# Patient Record
Sex: Female | Born: 1955
Health system: Southern US, Community
[De-identification: ages and names within clinical notes are randomized; demographics above are authoritative.]

## PROBLEM LIST (undated history)

## (undated) DIAGNOSIS — L84 Corns and callosities: Secondary | ICD-10-CM

## (undated) DIAGNOSIS — M722 Plantar fascial fibromatosis: Secondary | ICD-10-CM

## (undated) DIAGNOSIS — I10 Essential (primary) hypertension: Secondary | ICD-10-CM

## (undated) DIAGNOSIS — M1990 Unspecified osteoarthritis, unspecified site: Secondary | ICD-10-CM

## (undated) DIAGNOSIS — E785 Hyperlipidemia, unspecified: Secondary | ICD-10-CM

## (undated) DIAGNOSIS — M5416 Radiculopathy, lumbar region: Secondary | ICD-10-CM

## (undated) DIAGNOSIS — E669 Obesity, unspecified: Secondary | ICD-10-CM

## (undated) DIAGNOSIS — K760 Fatty (change of) liver, not elsewhere classified: Secondary | ICD-10-CM

## (undated) DIAGNOSIS — K219 Gastro-esophageal reflux disease without esophagitis: Secondary | ICD-10-CM

## (undated) DIAGNOSIS — T7840XA Allergy, unspecified, initial encounter: Secondary | ICD-10-CM

## (undated) DIAGNOSIS — E119 Type 2 diabetes mellitus without complications: Secondary | ICD-10-CM

## (undated) DIAGNOSIS — Z5189 Encounter for other specified aftercare: Secondary | ICD-10-CM

## (undated) DIAGNOSIS — H269 Unspecified cataract: Secondary | ICD-10-CM

## (undated) DIAGNOSIS — M549 Dorsalgia, unspecified: Secondary | ICD-10-CM

## (undated) DIAGNOSIS — K635 Polyp of colon: Secondary | ICD-10-CM

## (undated) DIAGNOSIS — E559 Vitamin D deficiency, unspecified: Secondary | ICD-10-CM

## (undated) HISTORY — PX: JOINT REPLACEMENT: SHX530

## (undated) HISTORY — DX: Unspecified cataract: H26.9

## (undated) HISTORY — DX: Polyp of colon: K63.5

## (undated) HISTORY — DX: Corns and callosities: L84

## (undated) HISTORY — DX: Type 2 diabetes mellitus without complications: E11.9

## (undated) HISTORY — PX: TUBAL LIGATION: SHX77

## (undated) HISTORY — DX: Hyperlipidemia, unspecified: E78.5

## (undated) HISTORY — DX: Vitamin D deficiency, unspecified: E55.9

## (undated) HISTORY — DX: Essential (primary) hypertension: I10

## (undated) HISTORY — DX: Obesity, unspecified: E66.9

## (undated) HISTORY — DX: Dorsalgia, unspecified: M54.9

## (undated) HISTORY — DX: Gastro-esophageal reflux disease without esophagitis: K21.9

## (undated) HISTORY — DX: Radiculopathy, lumbar region: M54.16

## (undated) HISTORY — DX: Plantar fascial fibromatosis: M72.2

## (undated) HISTORY — DX: Encounter for other specified aftercare: Z51.89

## (undated) HISTORY — DX: Fatty (change of) liver, not elsewhere classified: K76.0

## (undated) HISTORY — DX: Allergy, unspecified, initial encounter: T78.40XA

---

## 2004-05-13 ENCOUNTER — Ambulatory Visit: Payer: Self-pay

## 2005-04-06 ENCOUNTER — Ambulatory Visit: Payer: Self-pay | Admitting: Unknown Physician Specialty

## 2005-08-09 HISTORY — PX: DILATION AND CURETTAGE OF UTERUS: SHX78

## 2005-10-26 ENCOUNTER — Ambulatory Visit: Payer: Self-pay

## 2006-04-14 ENCOUNTER — Ambulatory Visit: Payer: Self-pay

## 2006-04-26 ENCOUNTER — Ambulatory Visit: Payer: Self-pay

## 2007-08-10 HISTORY — PX: COLONOSCOPY: SHX174

## 2007-08-10 LAB — HM COLONOSCOPY

## 2008-05-07 ENCOUNTER — Ambulatory Visit: Payer: Self-pay | Admitting: Physician Assistant

## 2008-06-25 ENCOUNTER — Ambulatory Visit: Payer: Self-pay | Admitting: Gastroenterology

## 2008-07-08 ENCOUNTER — Ambulatory Visit: Payer: Self-pay | Admitting: Family Medicine

## 2009-06-03 ENCOUNTER — Ambulatory Visit: Payer: Self-pay | Admitting: Family Medicine

## 2010-08-09 HISTORY — PX: APPENDECTOMY: SHX54

## 2010-08-09 HISTORY — PX: ABDOMINAL HYSTERECTOMY: SHX81

## 2012-07-12 HISTORY — PX: BREAST BIOPSY: SHX20

## 2012-07-19 ENCOUNTER — Ambulatory Visit: Payer: Self-pay | Admitting: Family Medicine

## 2012-07-20 ENCOUNTER — Ambulatory Visit: Payer: Self-pay | Admitting: Family Medicine

## 2012-07-27 ENCOUNTER — Ambulatory Visit: Payer: Self-pay | Admitting: Family Medicine

## 2013-03-01 ENCOUNTER — Ambulatory Visit: Payer: Self-pay | Admitting: Family Medicine

## 2013-03-13 ENCOUNTER — Ambulatory Visit: Payer: Self-pay | Admitting: Orthopedic Surgery

## 2013-03-14 ENCOUNTER — Encounter: Payer: Self-pay | Admitting: Family Medicine

## 2013-04-09 ENCOUNTER — Encounter: Payer: Self-pay | Admitting: Family Medicine

## 2014-08-09 DIAGNOSIS — E119 Type 2 diabetes mellitus without complications: Secondary | ICD-10-CM

## 2014-08-09 HISTORY — DX: Type 2 diabetes mellitus without complications: E11.9

## 2014-11-13 LAB — HM MAMMOGRAPHY: HM MAMMO: ABNORMAL

## 2014-12-02 LAB — LIPID PANEL
Cholesterol: 194 mg/dL (ref 0–200)
HDL: 42 mg/dL (ref 35–70)
LDL CALC: 84 mg/dL
TRIGLYCERIDES: 339 mg/dL — AB (ref 40–160)

## 2014-12-02 LAB — HEMOGLOBIN A1C: Hgb A1c MFr Bld: 6.9 % — AB (ref 4.0–6.0)

## 2014-12-09 ENCOUNTER — Other Ambulatory Visit: Payer: Self-pay | Admitting: Family Medicine

## 2014-12-09 DIAGNOSIS — R921 Mammographic calcification found on diagnostic imaging of breast: Secondary | ICD-10-CM

## 2014-12-09 DIAGNOSIS — N6310 Unspecified lump in the right breast, unspecified quadrant: Secondary | ICD-10-CM

## 2014-12-10 ENCOUNTER — Ambulatory Visit: Payer: 59

## 2014-12-10 ENCOUNTER — Ambulatory Visit: Payer: Self-pay

## 2014-12-10 ENCOUNTER — Ambulatory Visit
Admission: RE | Admit: 2014-12-10 | Discharge: 2014-12-10 | Disposition: A | Discharge status: Home / Self Care | Payer: 59 | Source: Ambulatory Visit | Attending: Family Medicine | Admitting: Family Medicine

## 2014-12-10 DIAGNOSIS — R928 Other abnormal and inconclusive findings on diagnostic imaging of breast: Secondary | ICD-10-CM | POA: Insufficient documentation

## 2014-12-10 DIAGNOSIS — N63 Unspecified lump in breast: Secondary | ICD-10-CM | POA: Diagnosis present

## 2014-12-10 DIAGNOSIS — N6310 Unspecified lump in the right breast, unspecified quadrant: Secondary | ICD-10-CM

## 2014-12-31 LAB — HM PAP SMEAR: HM Pap smear: NORMAL

## 2015-02-24 ENCOUNTER — Ambulatory Visit (INDEPENDENT_AMBULATORY_CARE_PROVIDER_SITE_OTHER): Payer: 59 | Admitting: Family Medicine

## 2015-02-24 ENCOUNTER — Encounter (INDEPENDENT_AMBULATORY_CARE_PROVIDER_SITE_OTHER): Payer: Self-pay

## 2015-02-24 ENCOUNTER — Encounter: Payer: Self-pay | Admitting: Family Medicine

## 2015-02-24 VITALS — BP 118/68 | HR 98 | Temp 97.9°F | Resp 18 | Ht 66.0 in | Wt 222.4 lb

## 2015-02-24 DIAGNOSIS — E785 Hyperlipidemia, unspecified: Secondary | ICD-10-CM | POA: Insufficient documentation

## 2015-02-24 DIAGNOSIS — R809 Proteinuria, unspecified: Secondary | ICD-10-CM

## 2015-02-24 DIAGNOSIS — E1129 Type 2 diabetes mellitus with other diabetic kidney complication: Secondary | ICD-10-CM | POA: Insufficient documentation

## 2015-02-24 DIAGNOSIS — J309 Allergic rhinitis, unspecified: Secondary | ICD-10-CM

## 2015-02-24 DIAGNOSIS — M109 Gout, unspecified: Secondary | ICD-10-CM | POA: Insufficient documentation

## 2015-02-24 DIAGNOSIS — E1121 Type 2 diabetes mellitus with diabetic nephropathy: Secondary | ICD-10-CM | POA: Diagnosis not present

## 2015-02-24 DIAGNOSIS — K219 Gastro-esophageal reflux disease without esophagitis: Secondary | ICD-10-CM

## 2015-02-24 DIAGNOSIS — M1611 Unilateral primary osteoarthritis, right hip: Secondary | ICD-10-CM | POA: Insufficient documentation

## 2015-02-24 DIAGNOSIS — M545 Low back pain, unspecified: Secondary | ICD-10-CM | POA: Insufficient documentation

## 2015-02-24 DIAGNOSIS — Z86010 Personal history of colonic polyps: Secondary | ICD-10-CM | POA: Insufficient documentation

## 2015-02-24 DIAGNOSIS — K760 Fatty (change of) liver, not elsewhere classified: Secondary | ICD-10-CM

## 2015-02-24 DIAGNOSIS — I10 Essential (primary) hypertension: Secondary | ICD-10-CM | POA: Diagnosis not present

## 2015-02-24 DIAGNOSIS — K429 Umbilical hernia without obstruction or gangrene: Secondary | ICD-10-CM | POA: Insufficient documentation

## 2015-02-24 DIAGNOSIS — E559 Vitamin D deficiency, unspecified: Secondary | ICD-10-CM | POA: Insufficient documentation

## 2015-02-24 DIAGNOSIS — Z8669 Personal history of other diseases of the nervous system and sense organs: Secondary | ICD-10-CM | POA: Insufficient documentation

## 2015-02-24 DIAGNOSIS — E669 Obesity, unspecified: Secondary | ICD-10-CM | POA: Insufficient documentation

## 2015-02-24 DIAGNOSIS — L84 Corns and callosities: Secondary | ICD-10-CM | POA: Insufficient documentation

## 2015-02-24 DIAGNOSIS — J3089 Other allergic rhinitis: Secondary | ICD-10-CM

## 2015-02-24 LAB — POCT GLYCOSYLATED HEMOGLOBIN (HGB A1C): HEMOGLOBIN A1C: 6.7

## 2015-02-24 MED ORDER — INVOKANA 300 MG PO TABS: 300.0000 mg | ORAL_TABLET | Freq: Every day | ORAL | Status: DC

## 2015-02-24 MED ORDER — MONTELUKAST SODIUM 10 MG PO TABS: 10.0000 mg | ORAL_TABLET | Freq: Every day | ORAL | Status: DC

## 2015-02-24 MED ORDER — WELCHOL 3.75 G PO PACK: 1.0000 | PACK | Freq: Every day | ORAL | Status: DC

## 2015-02-24 MED ORDER — AMLODIPINE-OLMESARTAN 5-40 MG PO TABS: 1.0000 | ORAL_TABLET | Freq: Every day | ORAL | Status: DC

## 2015-02-24 MED ORDER — ALLOPURINOL 300 MG PO TABS: 300.0000 mg | ORAL_TABLET | Freq: Every day | ORAL | Status: DC

## 2015-02-24 MED ORDER — ATORVASTATIN CALCIUM 20 MG PO TABS: 20.0000 mg | ORAL_TABLET | Freq: Every day | ORAL | Status: DC

## 2015-02-24 NOTE — Progress Notes (Signed)
Name: Maria Cruz   MRN: [id number]    DOB: May 24, 1956   Date:02/24/2015       Progress Note  Subjective  Chief Complaint  Chief Complaint  Patient presents with  . Medication Refill    3 month F/U  . Diabetes    Checks every other day Low-133 Average-140 High- 168, when patient perspires it smells like vinegar  . Hyperlipidemia    Makes patient feel weird  . Hypertension  . Allergic Rhinitis     Noise is stopped up-worsten    HPI   DMII: she has been compliant with medication, glucose is at goal, she has microalbuminuria, today urine micro was 100. She denies polyphagia, polydipsia or polyuria. Denies recent hypoglycemic episodes, at most twice month, she states she gets dizzy or diaphoresis, but is doing well now.   HTN: taking Tribenzor, and bp is towards low end of normal, we will adjust medication and stop HCTZ since she has a history of gout.  Denies edema, constipation, chest pain or palpitation.  Hyperlipidemia: taking Lipitor and Welchol - she states she compliant with statin but Welchol causes constipation and she needs to skip doses sometimes  Controlled Gout: taking Allopurinol, states last episode was months ago.   AR: taking singulair and symptoms are under control   OA of right hip: going to [place], taking Tramadol prn, right now there is no pain. States symptoms are better, pain is intermittent, a few times weeks and can be severe, it lasts about hours but improves with Tramadol   Patient Active Problem List   Diagnosis Date Noted  . Uncontrolled type 2 DM with microalbuminuria or microproteinuria 02/24/2015  . Vitamin D deficiency 02/24/2015  . Perennial allergic rhinitis 02/24/2015  . GERD without esophagitis 02/24/2015  . Hypertension, benign 02/24/2015  . Controlled gout 02/24/2015  . Fatty liver 02/24/2015  . Callus of foot 02/24/2015  . Primary osteoarthritis of right hip 02/24/2015  . History of carpal tunnel syndrome 02/24/2015  .  Obesity (BMI 30-39.9) 02/24/2015  . Umbilical hernia without obstruction or gangrene 02/24/2015  . Intermittent low back pain 02/24/2015  . History of colon polyps 02/24/2015  . Dyslipidemia 02/24/2015    Past Surgical History  Procedure Laterality Date  . Abdominal hysterectomy  2012  . Appendectomy  2012  . Dilation and curettage of uterus  2007  . Breast biopsy Right 07/12/2012    Negative  . Tubal ligation      Family History  Problem Relation Age of Onset  . Multiple sclerosis Mother   . Heart attack Father   . Kidney failure Sister   . Heart attack Brother   . Diabetes Daughter   . CAD Sister   . Arthritis Sister     RA  . Lupus Sister     History   Social History  . Marital Status: Single    Spouse Name: N/A  . Number of Children: N/A  . Years of Education: N/A   Occupational History  . Not on file.   Social History Main Topics  . Smoking status: Never Smoker   . Smokeless tobacco: Never Used  . Alcohol Use: No  . Drug Use: No  . Sexual Activity: Yes   Other Topics Concern  . Not on file   Social History Narrative     Current outpatient prescriptions:  .  allopurinol (ZYLOPRIM) 300 MG tablet, Take 1 tablet (300 mg total) by mouth daily., Disp: 90 tablet, Rfl: 1 .  atorvastatin (LIPITOR) 20 MG tablet, Take 1 tablet (20 mg total) by mouth daily., Disp: 90 tablet, Rfl: 1 .  Glucose Blood (FREESTYLE LITE TEST VI), , Disp: , Rfl:  .  INVOKANA 300 MG TABS tablet, Take 300 mg by mouth daily., Disp: 90 tablet, Rfl: 1 .  Lancets (FREESTYLE) lancets, , Disp: , Rfl:  .  montelukast (SINGULAIR) 10 MG tablet, Take 1 tablet (10 mg total) by mouth daily., Disp: 90 tablet, Rfl: 1 .  traMADol (ULTRAM) 50 MG tablet, Take 1 tablet by mouth daily., Disp: , Rfl:  .  WELCHOL 3.75 G PACK, Take 1 packet by mouth daily. Mix with 6 oz of fluids daily., Disp: 90 each, Rfl: 1 .  amLODipine-olmesartan (AZOR) 5-40 MG per tablet, Take 1 tablet by mouth daily., Disp: 90 tablet,  Rfl: 0  Allergies  Allergen Reactions  . Ace Inhibitors Cough  . Lipofen [Fenofibrate] Other (See Comments)    Localized drug reaction, blister on back  . Penicillins Itching     ROS  Constitutional: Negative for fever or weight change.  Respiratory: Negative for cough and shortness of breath.   Cardiovascular: Negative for chest pain or palpitations.  Gastrointestinal: Negative for abdominal pain, no bowel changes.  Musculoskeletal: Negative for gait problem or joint swelling.  Skin: Negative for rash.  Neurological: Negative for dizziness or headache.  No other specific complaints in a complete review of systems (except as listed in HPI above).  Objective  Filed Vitals:   02/24/15 1017  BP: 118/68  Pulse: 98  Temp: 97.9 F (36.6 C)  TempSrc: Oral  Resp: 18  Height: 5\' 6"  (1.676 m)  Weight: 222 lb 6.4 oz (100.88 kg)  SpO2: 97%    Body mass index is 35.91 kg/(m^2).  Physical Exam  Constitutional: Patient appears well-developed and well-nourished. No distress.  HENT: Head: Normocephalic and atraumatic.  Nose: Nose normal. Mouth/Throat: Oropharynx is clear and moist. No oropharyngeal exudate.  Eyes: Conjunctivae and EOM are normal. Pupils are equal, round, and reactive to light. No scleral icterus.  Neck: Normal range of motion. Neck supple. No JVD present. No thyromegaly present.  Cardiovascular: Normal rate, regular rhythm and normal heart sounds.  No murmur heard. No BLE edema. Pulmonary/Chest: Effort normal and breath sounds normal. No respiratory distress. Abdominal:  There is no tenderness. no masses Musculoskeletal: Normal range of motion, no joint effusions. No gross deformities. Decrease rom of right hip Neurological: he is alert and oriented to person, place, and time. No cranial nerve deficit. Coordination, balance, strength, speech and gait are normal.  Skin: Skin is warm and dry. No rash noted. No erythema.  Psychiatric: Patient has a normal mood and  affect. behavior is normal. Judgment and thought content normal.  Recent Results (from the past 2160 hour(s))  Lipid panel     Status: Abnormal   Collection Time: 12/02/14 12:00 AM  Result Value Ref Range   Triglycerides 339 (A) 40 - 160 mg/dL   Cholesterol 194 0 - 200 mg/dL   HDL 42 35 - 70 mg/dL   LDL Cholesterol 84 mg/dL  Hemoglobin A1c     Status: Abnormal   Collection Time: 12/02/14 12:00 AM  Result Value Ref Range   Hgb A1c MFr Bld 6.9 (A) 4.0 - 6.0 %  HM PAP SMEAR     Status: None   Collection Time: 12/31/14 12:00 AM  Result Value Ref Range   HM Pap smear normal   POCT HgB A1C  Status: None   Collection Time: 02/24/15 10:23 AM  Result Value Ref Range   Hemoglobin A1C 6.7     Diabetic Foot Exam - Simple   Simple Foot Form  Visual Inspection  See comments:  Yes  Sensation Testing  Intact to touch and monofilament testing bilaterally:  Yes  Pulse Check  Posterior Tibialis and Dorsalis pulse intact bilaterally:  Yes  Comments  Corn formation on both 5th toes and dryness on heels       PHQ2/9: Depression screen PHQ 2/9 02/24/2015  Decreased Interest 0  Down, Depressed, Hopeless 0  PHQ - 2 Score 0    Fall Risk: Fall Risk  02/24/2015  Falls in the past year? No     Assessment & Plan  1. Diabetic nephropathy associated with type 2 diabetes mellitus Doing well, continue medication, advised to drink more water to be able to tolerate Welchol daily  - POCT HgB A1C - WELCHOL 3.75 G PACK; Take 1 packet by mouth daily. Mix with 6 oz of fluids daily.  Dispense: 90 each; Refill: 1 - INVOKANA 300 MG TABS tablet; Take 300 mg by mouth daily.  Dispense: 90 tablet; Refill: 1  2. Perennial allergic rhinitis stable - montelukast (SINGULAIR) 10 MG tablet; Take 1 tablet (10 mg total) by mouth daily.  Dispense: 90 tablet; Refill: 1  3. GERD without esophagitis Under control with life style modification   4. Hypertension, benign Controlled, we will stop Tribenzor and  change to Azor because of history of gout and hypercalcemia - amLODipine-olmesartan (AZOR) 5-40 MG per tablet; Take 1 tablet by mouth daily.  Dispense: 90 tablet; Refill: 0  5. Controlled gout Doing well - allopurinol (ZYLOPRIM) 300 MG tablet; Take 1 tablet (300 mg total) by mouth daily.  Dispense: 90 tablet; Refill: 1  6. Fatty liver Discussed importance of weight loss and dietary modifcation  7. Primary osteoarthritis of right hip Continue Tramadol prn and follow up with [facility]  8. Dyslipidemia  - WELCHOL 3.75 G PACK; Take 1 packet by mouth daily. Mix with 6 oz of fluids daily.  Dispense: 90 each; Refill: 1 - atorvastatin (LIPITOR) 20 MG tablet; Take 1 tablet (20 mg total) by mouth daily.  Dispense: 90 tablet; Refill: 1

## 2015-04-14 ENCOUNTER — Other Ambulatory Visit: Payer: Self-pay | Admitting: Family Medicine

## 2015-05-27 ENCOUNTER — Encounter: Payer: Self-pay | Admitting: Family Medicine

## 2015-05-27 ENCOUNTER — Ambulatory Visit (INDEPENDENT_AMBULATORY_CARE_PROVIDER_SITE_OTHER): Payer: 59 | Admitting: Family Medicine

## 2015-05-27 VITALS — BP 126/84 | HR 98 | Temp 98.6°F | Resp 14 | Ht 66.0 in | Wt 224.9 lb

## 2015-05-27 DIAGNOSIS — E785 Hyperlipidemia, unspecified: Secondary | ICD-10-CM | POA: Diagnosis not present

## 2015-05-27 DIAGNOSIS — E1129 Type 2 diabetes mellitus with other diabetic kidney complication: Secondary | ICD-10-CM | POA: Diagnosis not present

## 2015-05-27 DIAGNOSIS — R809 Proteinuria, unspecified: Secondary | ICD-10-CM | POA: Diagnosis not present

## 2015-05-27 DIAGNOSIS — E669 Obesity, unspecified: Secondary | ICD-10-CM | POA: Diagnosis not present

## 2015-05-27 DIAGNOSIS — I10 Essential (primary) hypertension: Secondary | ICD-10-CM | POA: Diagnosis not present

## 2015-05-27 LAB — POCT GLYCOSYLATED HEMOGLOBIN (HGB A1C): HEMOGLOBIN A1C: 6.6

## 2015-05-27 LAB — POCT UA - MICROALBUMIN: Microalbumin Ur, POC: 10 mg/L

## 2015-05-27 MED ORDER — COENZYME Q10 30 MG PO CAPS: 30.0000 mg | ORAL_CAPSULE | Freq: Three times a day (TID) | ORAL | Status: DC

## 2015-05-27 NOTE — Progress Notes (Signed)
Name: Maria Cruz   MRN: [id number]    DOB: [birth year]   Date:05/27/2015       Progress Note  Subjective  Chief Complaint  Chief Complaint  Patient presents with  . Medication Refill    follow-up  . Diabetes    Checks BG every other week low-100, high-133  . Hypertension  . Hyperlipidemia  . Gout    HPI  DM II with proteinuria: urine micro is back to normal on ARB, taking Invokana and is tolerating it well, HgbA1C is at goal , she is denies polyphagia, polydipsia or polyuria.   Hyperlipidemia: she is taking Atorvastatin and states has some muscle aches after she takes it.   HTN: taking medication, bp is at goal, [employer] form filled out, by they want it below 120/80.  Denies chest pain or palpations  Gout: no recent episodes, taking allopurinol daily, denies side effects.   Obesity: she wanted me to fill out her work note stating that she is exempted from weight loss, but she gained 2 lbs since last year, and not losing. I will not fill out the form.   Patient Active Problem List   Diagnosis Date Noted  . Uncontrolled type 2 DM with microalbuminuria or microproteinuria 02/24/2015  . Vitamin D deficiency 02/24/2015  . Perennial allergic rhinitis 02/24/2015  . GERD without esophagitis 02/24/2015  . Hypertension, benign 02/24/2015  . Controlled gout 02/24/2015  . Fatty liver 02/24/2015  . Callus of foot 02/24/2015  . Primary osteoarthritis of right hip 02/24/2015  . History of carpal tunnel syndrome 02/24/2015  . Obesity (BMI 30-39.9) 02/24/2015  . Umbilical hernia without obstruction or gangrene 02/24/2015  . Intermittent low back pain 02/24/2015  . History of colon polyps 02/24/2015  . Dyslipidemia 02/24/2015    Past Surgical History  Procedure Laterality Date  . Abdominal hysterectomy  2012  . Appendectomy  2012  . Dilation and curettage of uterus  2007  . Breast biopsy Right 07/12/2012    Negative  . Tubal ligation      Family History  Problem Relation  Age of Onset  . Multiple sclerosis Mother   . Heart attack Father   . Kidney failure Sister   . Heart attack Brother   . Diabetes Daughter   . CAD Sister   . Arthritis Sister     RA  . Lupus Sister     Social History   Social History  . Marital Status: Single    Spouse Name: N/A  . Number of Children: N/A  . Years of Education: N/A   Occupational History  . Not on file.   Social History Main Topics  . Smoking status: Never Smoker   . Smokeless tobacco: Never Used  . Alcohol Use: No  . Drug Use: No  . Sexual Activity: Yes   Other Topics Concern  . Not on file   Social History Narrative     Current outpatient prescriptions:  .  allopurinol (ZYLOPRIM) 300 MG tablet, Take 1 tablet (300 mg total) by mouth daily., Disp: 90 tablet, Rfl: 1 .  atorvastatin (LIPITOR) 20 MG tablet, Take 1 tablet (20 mg total) by mouth daily., Disp: 90 tablet, Rfl: 1 .  AZOR 5-40 MG per tablet, Take 1 tablet by mouth  daily, Disp: 90 tablet, Rfl: 1 .  Glucose Blood (FREESTYLE LITE TEST VI), , Disp: , Rfl:  .  INVOKANA 300 MG TABS tablet, Take 300 mg by mouth daily., Disp: 90 tablet, Rfl: 1 .  Lancets (FREESTYLE) lancets, , Disp: , Rfl:  .  montelukast (SINGULAIR) 10 MG tablet, Take 1 tablet (10 mg total) by mouth daily., Disp: 90 tablet, Rfl: 1 .  traMADol (ULTRAM) 50 MG tablet, Take 1 tablet by mouth daily., Disp: , Rfl:  .  WELCHOL 3.75 G PACK, Take 1 packet by mouth daily. Mix with 6 oz of fluids daily., Disp: 90 each, Rfl: 1  Allergies  Allergen Reactions  . Ace Inhibitors Cough  . Lipofen [Fenofibrate] Other (See Comments)    Localized drug reaction, blister on back  . Penicillins Itching     ROS  Constitutional: Negative for fever or weight change.  Respiratory: Negative for cough and shortness of breath.   Cardiovascular: Negative for chest pain or palpitations.  Gastrointestinal: Negative for abdominal pain, no bowel changes.  Musculoskeletal: Negative for gait problem or  joint swelling.  Skin: Negative for rash.  Neurological: Negative for dizziness or headache.  No other specific complaints in a complete review of systems (except as listed in HPI above).  Objective  Filed Vitals:   05/27/15 0959  BP: 140/88  Pulse: 98  Temp: 98.6 F (37 C)  TempSrc: Oral  Resp: 14  Height: 5\' 6"  (1.676 m)  Weight: 224 lb 14.4 oz (102.014 kg)  SpO2: 96%    Body mass index is 36.32 kg/(m^2).  Physical Exam  Constitutional: Patient appears well-developed . ObeseNo distress.  HEENT: head atraumatic, normocephalic, pupils equal and reactive to light,  neck supple, throat within normal limits Cardiovascular: Normal rate, regular rhythm and normal heart sounds.  No murmur heard. No BLE edema. Pulmonary/Chest: Effort normal and breath sounds normal. No respiratory distress. Abdominal: Soft.  There is no tenderness. Psychiatric: Patient has a normal mood and affect. behavior is normal. Judgment and thought content normal.  Recent Results (from the past 2160 hour(s))  POCT UA - Microalbumin     Status: Abnormal   Collection Time: 05/27/15 10:06 AM  Result Value Ref Range   Microalbumin Ur, POC 10 mg/L   Creatinine, POC  mg/dL   Albumin/Creatinine Ratio, Urine, POC    POCT HgB A1C     Status: Abnormal   Collection Time: 05/27/15 10:16 AM  Result Value Ref Range   Hemoglobin A1C 6.6      PHQ2/9: Depression screen [facility] 2/9 05/27/2015 02/24/2015  Decreased Interest 0 0  Down, Depressed, Hopeless 0 0  PHQ - 2 Score 0 0    Fall Risk: Fall Risk  05/27/2015 02/24/2015  Falls in the past year? No No     Functional Status Survey: Is the patient deaf or have difficulty hearing?: No Does the patient have difficulty seeing, even when wearing glasses/contacts?: Yes (glasses) Does the patient have difficulty concentrating, remembering, or making decisions?: No Does the patient have difficulty walking or climbing stairs?: No Does the patient have difficulty  dressing or bathing?: No Does the patient have difficulty doing errands alone such as visiting a doctor's office or shopping?: No    Assessment & Plan  1. Type 2 diabetes mellitus with microalbuminuria, without long-term current use of insulin (HCC) At goal, continue medications - POCT UA - Microalbumin - POCT HgB A1C  2. Dyslipidemia  Taking Atorvastatin and gets achy, advised to take it at night with CoQ 10   3. Hypertension, benign  Recheck bp before she goes home, bp was up when she came in   4. Obesity (BMI 30-39.9)  Discussed with the patient the risk posed by an  increased BMI. Discussed importance of portion control, calorie counting and at least 150 minutes of physical activity weekly. Avoid sweet beverages and drink more water. Eat at least 6 servings of fruit and vegetables daily  She asked me to filled out form for work but she has not lost weight, so I can't

## 2015-06-30 ENCOUNTER — Other Ambulatory Visit: Payer: Self-pay | Admitting: Family Medicine

## 2015-06-30 NOTE — Telephone Encounter (Signed)
Patient requesting refill. 

## 2015-08-27 ENCOUNTER — Encounter: Payer: Self-pay | Admitting: Family Medicine

## 2015-08-27 ENCOUNTER — Ambulatory Visit (INDEPENDENT_AMBULATORY_CARE_PROVIDER_SITE_OTHER): Payer: 59 | Admitting: Family Medicine

## 2015-08-27 VITALS — BP 122/84 | HR 99 | Temp 97.7°F | Resp 16 | Wt 223.8 lb

## 2015-08-27 DIAGNOSIS — E785 Hyperlipidemia, unspecified: Secondary | ICD-10-CM | POA: Diagnosis not present

## 2015-08-27 DIAGNOSIS — R809 Proteinuria, unspecified: Secondary | ICD-10-CM

## 2015-08-27 DIAGNOSIS — E1129 Type 2 diabetes mellitus with other diabetic kidney complication: Secondary | ICD-10-CM | POA: Diagnosis not present

## 2015-08-27 DIAGNOSIS — I10 Essential (primary) hypertension: Secondary | ICD-10-CM

## 2015-08-27 DIAGNOSIS — M109 Gout, unspecified: Secondary | ICD-10-CM | POA: Diagnosis not present

## 2015-08-27 DIAGNOSIS — K219 Gastro-esophageal reflux disease without esophagitis: Secondary | ICD-10-CM

## 2015-08-27 DIAGNOSIS — K760 Fatty (change of) liver, not elsewhere classified: Secondary | ICD-10-CM | POA: Diagnosis not present

## 2015-08-27 DIAGNOSIS — J3089 Other allergic rhinitis: Secondary | ICD-10-CM

## 2015-08-27 DIAGNOSIS — Z23 Encounter for immunization: Secondary | ICD-10-CM | POA: Diagnosis not present

## 2015-08-27 DIAGNOSIS — J309 Allergic rhinitis, unspecified: Secondary | ICD-10-CM

## 2015-08-27 DIAGNOSIS — E559 Vitamin D deficiency, unspecified: Secondary | ICD-10-CM

## 2015-08-27 LAB — POCT GLYCOSYLATED HEMOGLOBIN (HGB A1C): HEMOGLOBIN A1C: 6.4

## 2015-08-27 MED ORDER — COLESEVELAM HCL 625 MG PO TABS: 1875.0000 mg | ORAL_TABLET | Freq: Two times a day (BID) | ORAL | Status: DC

## 2015-08-27 MED ORDER — LINAGLIPTIN-METFORMIN HCL ER 5-1000 MG PO TB24: 1.0000 | ORAL_TABLET | Freq: Every day | ORAL | Status: DC

## 2015-08-27 MED ORDER — AMLODIPINE-OLMESARTAN 5-40 MG PO TABS: ORAL_TABLET | ORAL | Status: DC

## 2015-08-27 MED ORDER — INVOKANA 300 MG PO TABS: 300.0000 mg | ORAL_TABLET | Freq: Every day | ORAL | Status: DC

## 2015-08-27 MED ORDER — MONTELUKAST SODIUM 10 MG PO TABS: 10.0000 mg | ORAL_TABLET | Freq: Every day | ORAL | Status: DC

## 2015-08-27 MED ORDER — ATORVASTATIN CALCIUM 20 MG PO TABS: 20.0000 mg | ORAL_TABLET | Freq: Every day | ORAL | Status: DC

## 2015-08-27 MED ORDER — ALLOPURINOL 300 MG PO TABS: 300.0000 mg | ORAL_TABLET | Freq: Every day | ORAL | Status: DC

## 2015-08-27 MED ORDER — COENZYME Q10 30 MG PO CAPS: 100.0000 mg | ORAL_CAPSULE | Freq: Every day | ORAL | Status: DC

## 2015-08-27 NOTE — Progress Notes (Signed)
Name: Maria Cruz   MRN: [id number]    DOB: 03/11/1956   Date:08/27/2015       Progress Note  Subjective  Chief Complaint  Chief Complaint  Patient presents with  . Diabetes    highest = 160, lowest = 130; check it twice/ week  . Medication Refill    invokana & janumet    HPI   DM II with proteinuria: urine micro is back to normal on ARB, taking Invokana, she states insurance is denying Janumet, she would like to try Jentadueto again since it worked best for her - no diarrhea with it. HgbA1C is at goal , she is denies polyphagia, polydipsia or polyuria.   Hyperlipidemia: she is taking Atorvastatin and states has some muscle aches after she takes it, advised to take with CoQ10. She is also taking Welchol but she has difficulty taking the powder and would like to try pills instead.   HTN: taking medication, bp is at goal. Denies chest pain or palpations  Gout: no recent episodes, taking allopurinol daily, denies side effects.   Obesity: she has lost a couple of lbs since last visit, she is eating healthier.    Patient Active Problem List   Diagnosis Date Noted  . Uncontrolled type 2 DM with microalbuminuria or microproteinuria 02/24/2015  . Vitamin D deficiency 02/24/2015  . Perennial allergic rhinitis 02/24/2015  . GERD without esophagitis 02/24/2015  . Hypertension, benign 02/24/2015  . Controlled gout 02/24/2015  . Fatty liver 02/24/2015  . Callus of foot 02/24/2015  . Primary osteoarthritis of right hip 02/24/2015  . History of carpal tunnel syndrome 02/24/2015  . Obesity (BMI 30-39.9) 02/24/2015  . Umbilical hernia without obstruction or gangrene 02/24/2015  . Intermittent low back pain 02/24/2015  . History of colon polyps 02/24/2015  . Dyslipidemia 02/24/2015    Past Surgical History  Procedure Laterality Date  . Abdominal hysterectomy  2012  . Appendectomy  2012  . Dilation and curettage of uterus  2007  . Breast biopsy Right 07/12/2012    Negative  .  Tubal ligation      Family History  Problem Relation Age of Onset  . Multiple sclerosis Mother   . Heart attack Father   . Kidney failure Sister   . Heart attack Brother   . Diabetes Daughter   . CAD Sister   . Arthritis Sister     RA  . Lupus Sister     Social History   Social History  . Marital Status: Single    Spouse Name: N/A  . Number of Children: N/A  . Years of Education: N/A   Occupational History  . Not on file.   Social History Main Topics  . Smoking status: Never Smoker   . Smokeless tobacco: Never Used  . Alcohol Use: No  . Drug Use: No  . Sexual Activity: Yes   Other Topics Concern  . Not on file   Social History Narrative     Current outpatient prescriptions:  .  allopurinol (ZYLOPRIM) 300 MG tablet, Take 1 tablet (300 mg total) by mouth daily., Disp: 90 tablet, Rfl: 1 .  amLODipine-olmesartan (AZOR) 5-40 MG tablet, Take 1 tablet by mouth  daily, Disp: 90 tablet, Rfl: 1 .  atorvastatin (LIPITOR) 20 MG tablet, Take 1 tablet (20 mg total) by mouth daily., Disp: 90 tablet, Rfl: 1 .  co-enzyme Q-10 30 MG capsule, Take 1 capsule (30 mg total) by mouth 3 (three) times daily., Disp: 30 capsule, Rfl:  0 .  Glucose Blood (FREESTYLE LITE TEST VI), , Disp: , Rfl:  .  INVOKANA 300 MG TABS tablet, Take 300 mg by mouth daily before breakfast., Disp: 90 tablet, Rfl: 1 .  Lancets (FREESTYLE) lancets, , Disp: , Rfl:  .  montelukast (SINGULAIR) 10 MG tablet, Take 1 tablet (10 mg total) by mouth daily., Disp: 90 tablet, Rfl: 1 .  traMADol (ULTRAM) 50 MG tablet, Take 1 tablet by mouth daily., Disp: , Rfl:  .  colesevelam (WELCHOL) 625 MG tablet, Take 3 tablets (1,875 mg total) by mouth 2 (two) times daily with a meal., Disp: 540 tablet, Rfl: 1 .  Linagliptin-Metformin HCl ER (JENTADUETO XR) 12-998 MG TB24, Take 1 tablet by mouth daily., Disp: 90 tablet, Rfl: 1  Allergies  Allergen Reactions  . Ace Inhibitors Cough  . Lipofen [Fenofibrate] Other (See Comments)     Localized drug reaction, blister on back  . Penicillins Itching     ROS   Constitutional: Negative for fever or weight change.  Respiratory: Negative for cough and shortness of breath.   Cardiovascular: Negative for chest pain or palpitations.  Gastrointestinal: Negative for abdominal pain, no bowel changes.  Musculoskeletal: Negative for gait problem or joint swelling.  Skin: Negative for rash.  Neurological: Negative for dizziness or headache.  No other specific complaints in a complete review of systems (except as listed in HPI above).  Objective  Filed Vitals:   08/27/15 1020  BP: 122/84  Pulse: 99  Temp: 97.7 F (36.5 C)  TempSrc: Oral  Resp: 16  Weight: 223 lb 12.8 oz (101.515 kg)  SpO2: 96%    Body mass index is 36.14 kg/(m^2).  Physical Exam  Constitutional: Patient appears well-developed and well-nourished. Obese No distress.  HEENT: head atraumatic, normocephalic, pupils equal and reactive to light, neck supple, throat within normal limits Cardiovascular: Normal rate, regular rhythm and normal heart sounds.  No murmur heard. No BLE edema. Pulmonary/Chest: Effort normal and breath sounds normal. No respiratory distress. Abdominal: Soft.  There is no tenderness. Psychiatric: Patient has a normal mood and affect. behavior is normal. Judgment and thought content normal.  Recent Results (from the past 2160 hour(s))  POCT glycosylated hemoglobin (Hb A1C)     Status: Abnormal   Collection Time: 08/27/15 10:36 AM  Result Value Ref Range   Hemoglobin A1C 6.4      PHQ2/9: Depression screen [facility] 2/9 08/27/2015 05/27/2015 02/24/2015  Decreased Interest 0 0 0  Down, Depressed, Hopeless 0 0 0  PHQ - 2 Score 0 0 0    Fall Risk: Fall Risk  08/27/2015 05/27/2015 02/24/2015  Falls in the past year? No No No     Functional Status Survey: Is the patient deaf or have difficulty hearing?: No Does the patient have difficulty seeing, even when wearing glasses/contacts?:  No Does the patient have difficulty concentrating, remembering, or making decisions?: No Does the patient have difficulty walking or climbing stairs?: No Does the patient have difficulty dressing or bathing?: No Does the patient have difficulty doing errands alone such as visiting a doctor's office or shopping?: No    Assessment & Plan  1. Controlled type 2 diabetes mellitus with microalbuminuria, without long-term current use of insulin ([place])  She states Janumet does not work as well for her. Jentadueto does not cause diarrhea - POCT glycosylated hemoglobin (Hb A1C) - Linagliptin-Metformin HCl ER (JENTADUETO XR) 12-998 MG TB24; Take 1 tablet by mouth daily.  Dispense: 90 tablet; Refill: 1 - INVOKANA 300  MG TABS tablet; Take 300 mg by mouth daily before breakfast.  Dispense: 90 tablet; Refill: 1  2. Dyslipidemia  - Lipid panel - atorvastatin (LIPITOR) 20 MG tablet; Take 1 tablet (20 mg total) by mouth daily.  Dispense: 90 tablet; Refill: 1 - welchol we will switch to pills twice daily    3. Hypertension, benign  At goal  - Comprehensive metabolic panel - amLODipine-olmesartan (AZOR) 5-40 MG tablet; Take 1 tablet by mouth  daily  Dispense: 90 tablet; Refill: 1  4. Fatty liver  Recheck labs  5. Controlled gout  - Uric acid - allopurinol (ZYLOPRIM) 300 MG tablet; Take 1 tablet (300 mg total) by mouth daily.  Dispense: 90 tablet; Refill: 1  6. GERD without esophagitis  Under control   7. Vitamin D deficiency  - VITAMIN D 25 Hydroxy (Vit-D Deficiency, Fractures)  8. Need for pneumococcal vaccination  - Pneumococcal conjugate vaccine 13-valent IM  9. Perennial allergic rhinitis  - montelukast (SINGULAIR) 10 MG tablet; Take 1 tablet (10 mg total) by mouth daily.  Dispense: 90 tablet; Refill: 1

## 2015-08-28 ENCOUNTER — Encounter: Payer: Self-pay | Admitting: Family Medicine

## 2015-08-28 ENCOUNTER — Telehealth: Payer: Self-pay

## 2015-08-28 LAB — COMPREHENSIVE METABOLIC PANEL
A/G RATIO: 1.6 (ref 1.1–2.5)
ALT: 41 IU/L — AB (ref 0–32)
AST: 44 IU/L — ABNORMAL HIGH (ref 0–40)
Albumin: 4.8 g/dL (ref 3.5–5.5)
Alkaline Phosphatase: 113 IU/L (ref 39–117)
BUN / CREAT RATIO: 21 (ref 9–23)
BUN: 14 mg/dL (ref 6–24)
Bilirubin Total: 0.4 mg/dL (ref 0.0–1.2)
CALCIUM: 10.6 mg/dL — AB (ref 8.7–10.2)
CHLORIDE: 99 mmol/L (ref 96–106)
CO2: 26 mmol/L (ref 18–29)
CREATININE: 0.66 mg/dL (ref 0.57–1.00)
GFR, EST AFRICAN AMERICAN: 113 mL/min/{1.73_m2} (ref >59)
GFR, EST NON AFRICAN AMERICAN: 98 mL/min/{1.73_m2} (ref >59)
Globulin, Total: 3 g/dL (ref 1.5–4.5)
Glucose: 134 mg/dL — ABNORMAL HIGH (ref 65–99)
POTASSIUM: 4.2 mmol/L (ref 3.5–5.2)
SODIUM: 140 mmol/L (ref 134–144)
TOTAL PROTEIN: 7.8 g/dL (ref 6.0–8.5)

## 2015-08-28 LAB — LIPID PANEL
CHOL/HDL RATIO: 5.5 ratio — AB (ref 0.0–4.4)
CHOLESTEROL TOTAL: 270 mg/dL — AB (ref 100–199)
HDL: 49 mg/dL (ref >39)
LDL Calculated: 142 mg/dL — ABNORMAL HIGH (ref 0–99)
TRIGLYCERIDES: 395 mg/dL — AB (ref 0–149)
VLDL Cholesterol Cal: 79 mg/dL — ABNORMAL HIGH (ref 5–40)

## 2015-08-28 LAB — URIC ACID: Uric Acid: 7.2 mg/dL — ABNORMAL HIGH (ref 2.5–7.1)

## 2015-08-28 LAB — VITAMIN D 25 HYDROXY (VIT D DEFICIENCY, FRACTURES): Vit D, 25-Hydroxy: 35 ng/mL (ref 30.0–100.0)

## 2015-08-28 NOTE — Telephone Encounter (Signed)
Patient returned my call and a copy of her results was mailed to her.

## 2015-09-09 ENCOUNTER — Telehealth: Payer: Self-pay

## 2015-09-09 NOTE — Telephone Encounter (Signed)
Called patient had to leave a message. Her medication that Dr. Ancil Boozer prescribed her Jentadueto XR required a Prior Auth by [employer]. I completed that as of yesterday 09-08-15 and her insurance company and pharmacy benefits [employer]) approved of the medication as of today 09-09-15 through 08-26-2025 as long as her pharmacy benefits and plan stays the same.

## 2015-11-25 ENCOUNTER — Ambulatory Visit (INDEPENDENT_AMBULATORY_CARE_PROVIDER_SITE_OTHER): Payer: 59 | Admitting: Family Medicine

## 2015-11-25 ENCOUNTER — Encounter: Payer: Self-pay | Admitting: Family Medicine

## 2015-11-25 VITALS — BP 118/68 | HR 104 | Temp 98.2°F | Resp 18 | Ht 66.0 in | Wt 226.1 lb

## 2015-11-25 DIAGNOSIS — E1129 Type 2 diabetes mellitus with other diabetic kidney complication: Secondary | ICD-10-CM

## 2015-11-25 DIAGNOSIS — J309 Allergic rhinitis, unspecified: Secondary | ICD-10-CM | POA: Diagnosis not present

## 2015-11-25 DIAGNOSIS — I10 Essential (primary) hypertension: Secondary | ICD-10-CM

## 2015-11-25 DIAGNOSIS — E785 Hyperlipidemia, unspecified: Secondary | ICD-10-CM

## 2015-11-25 DIAGNOSIS — R809 Proteinuria, unspecified: Secondary | ICD-10-CM | POA: Diagnosis not present

## 2015-11-25 DIAGNOSIS — K219 Gastro-esophageal reflux disease without esophagitis: Secondary | ICD-10-CM

## 2015-11-25 DIAGNOSIS — M160 Bilateral primary osteoarthritis of hip: Secondary | ICD-10-CM | POA: Diagnosis not present

## 2015-11-25 DIAGNOSIS — M109 Gout, unspecified: Secondary | ICD-10-CM | POA: Diagnosis not present

## 2015-11-25 DIAGNOSIS — J3089 Other allergic rhinitis: Secondary | ICD-10-CM

## 2015-11-25 LAB — POCT UA - MICROALBUMIN: MICROALBUMIN (UR) POC: 50 mg/L

## 2015-11-25 LAB — POCT GLYCOSYLATED HEMOGLOBIN (HGB A1C): Hemoglobin A1C: 7

## 2015-11-25 MED ORDER — LEVOCETIRIZINE DIHYDROCHLORIDE 5 MG PO TABS: 5.0000 mg | ORAL_TABLET | Freq: Every evening | ORAL | Status: DC

## 2015-11-25 MED ORDER — INVOKANA 300 MG PO TABS: 300.0000 mg | ORAL_TABLET | Freq: Every day | ORAL | Status: DC

## 2015-11-25 MED ORDER — AMLODIPINE-OLMESARTAN 5-40 MG PO TABS: ORAL_TABLET | ORAL | Status: DC

## 2015-11-25 MED ORDER — ALLOPURINOL 300 MG PO TABS: 300.0000 mg | ORAL_TABLET | Freq: Every day | ORAL | Status: DC

## 2015-11-25 MED ORDER — LINAGLIPTIN-METFORMIN HCL ER 2.5-1000 MG PO TB24: 2.0000 | ORAL_TABLET | Freq: Every day | ORAL | Status: DC

## 2015-11-25 MED ORDER — MONTELUKAST SODIUM 10 MG PO TABS: 10.0000 mg | ORAL_TABLET | Freq: Every day | ORAL | Status: DC

## 2015-11-25 NOTE — Progress Notes (Signed)
Name: Maria Cruz   MRN: [id number]    DOB: 03-19-1956   Date:11/25/2015       Progress Note  Subjective  Chief Complaint  Chief Complaint  Patient presents with  . Medication Refill    3 month F/U  . Diabetes    Checks sugar weekly, average-126 high-133  . Hypertension  . Hyperlipidemia  . Hip Pain    Unchanged, takes tramadol as needed  . Allergic Rhinitis     Worsen with excessive pollen outside, itchy eyes, nasal congestion, and watery eyes, taking medication daily.    HPI  DM II with proteinuria: urine micro is up to 50 again, continue ARB , taking Invokana and Jentadueto, no diarrhea with it. HgbA1C is at goal but is trending up again, she is denies polyphagia, polydipsia or polyuria.   Hyperlipidemia: she is taking Atorvastatin and states had some muscle aches after she took it, but she has been taking at night now and is not having any problems. She is also taking Welchol but she has difficulty taking the powder  HTN: taking medication, bp is at goal. Denies chest pain or palpations , no SOB  Gout: no recent episodes, taking allopurinol daily, denies side effects.   Obesity:  She has gained some weight since last visit, not following a good diet lately  AR: symptoms are getting worse, with itchy eyes, nasal congestion, watery eyes. Taking singulair we will add Xyzal, she has tried Zyrtec and Loratadine in the past  OA hips: worse on the right side, taking Tylenol every night, and Tramadol very seldom, she has an antalgic gait that is worse after work. Discussed referral to Ortho and she will think about it  Past Surgical History  Procedure Laterality Date  . Abdominal hysterectomy  2012  . Appendectomy  2012  . Dilation and curettage of uterus  2007  . Breast biopsy Right 07/12/2012    Negative  . Tubal ligation      Family History  Problem Relation Age of Onset  . Multiple sclerosis Mother   . Heart attack Father   . Kidney failure Sister   . Heart attack  Brother   . Diabetes Daughter   . CAD Sister   . Arthritis Sister     RA  . Lupus Sister     Social History   Social History  . Marital Status: Single    Spouse Name: N/A  . Number of Children: N/A  . Years of Education: N/A   Occupational History  . Not on file.   Social History Main Topics  . Smoking status: Never Smoker   . Smokeless tobacco: Never Used  . Alcohol Use: No  . Drug Use: No  . Sexual Activity: Yes   Other Topics Concern  . Not on file   Social History Narrative     Current outpatient prescriptions:  .  allopurinol (ZYLOPRIM) 300 MG tablet, Take 1 tablet (300 mg total) by mouth daily., Disp: 90 tablet, Rfl: 1 .  amLODipine-olmesartan (AZOR) 5-40 MG tablet, Take 1 tablet by mouth  daily, Disp: 90 tablet, Rfl: 1 .  atorvastatin (LIPITOR) 20 MG tablet, Take 1 tablet (20 mg total) by mouth daily., Disp: 90 tablet, Rfl: 1 .  colesevelam (WELCHOL) 625 MG tablet, Take 3 tablets (1,875 mg total) by mouth 2 (two) times daily with a meal., Disp: 540 tablet, Rfl: 1 .  Glucose Blood (FREESTYLE LITE TEST VI), , Disp: , Rfl:  .  INVOKANA 300  MG TABS tablet, Take 1 tablet (300 mg total) by mouth daily before breakfast., Disp: 90 tablet, Rfl: 1 .  Lancets (FREESTYLE) lancets, , Disp: , Rfl:  .  montelukast (SINGULAIR) 10 MG tablet, Take 1 tablet (10 mg total) by mouth daily., Disp: 90 tablet, Rfl: 1 .  traMADol (ULTRAM) 50 MG tablet, Take 1 tablet by mouth daily., Disp: , Rfl:  .  levocetirizine (XYZAL) 5 MG tablet, Take 1 tablet (5 mg total) by mouth every evening., Disp: 90 tablet, Rfl: 1 .  Linagliptin-Metformin HCl ER (JENTADUETO XR) 2.12-998 MG TB24, Take 2 tablets by mouth daily., Disp: 180 tablet, Rfl: 1  Allergies  Allergen Reactions  . Ace Inhibitors Cough  . Lipofen [Fenofibrate] Other (See Comments)    Localized drug reaction, blister on back  . Penicillins Itching     ROS  Constitutional: Negative for fever or weight change.  Respiratory: Negative  for cough and shortness of breath.   Cardiovascular: Negative for chest pain or palpitations.  Gastrointestinal: Negative for abdominal pain, no bowel changes.  Musculoskeletal: Positive  for gait problem no  joint swelling.  Skin: Negative for rash.  Neurological: Negative for dizziness or headache.  No other specific complaints in a complete review of systems (except as listed in HPI above).  Objective  Filed Vitals:   11/25/15 1157  BP: 118/68  Pulse: 104  Temp: 98.2 F (36.8 C)  TempSrc: Oral  Resp: 18  Height: 5\' 6"  (1.676 m)  Weight: 226 lb 1.6 oz (102.558 kg)  SpO2: 98%    Body mass index is 36.51 kg/(m^2).  Physical Exam  Constitutional: Patient appears well-developed and well-nourished. Obese  No distress.  HEENT: head atraumatic, normocephalic, pupils equal and reactive to light,  neck supple, throat within normal limits Cardiovascular: Normal rate, regular rhythm and normal heart sounds.  No murmur heard. No BLE edema. Pulmonary/Chest: Effort normal and breath sounds normal. No respiratory distress. Abdominal: Soft.  There is no tenderness. Psychiatric: Patient has a normal mood and affect. behavior is normal. Judgment and thought content normal. Muscular Skeletal: she has very tight hips, pain with rom.   Recent Results (from the past 2160 hour(s))  POCT HgB A1C     Status: Abnormal   Collection Time: 11/25/15 12:01 PM  Result Value Ref Range   Hemoglobin A1C 7.0   POCT UA - Microalbumin     Status: Abnormal   Collection Time: 11/25/15 12:01 PM  Result Value Ref Range   Microalbumin Ur, POC 50 mg/L   Creatinine, POC  mg/dL   Albumin/Creatinine Ratio, Urine, POC       PHQ2/9: Depression screen [facility] 2/9 11/25/2015 08/27/2015 05/27/2015 02/24/2015  Decreased Interest 0 0 0 0  Down, Depressed, Hopeless 0 0 0 0  PHQ - 2 Score 0 0 0 0     Fall Risk: Fall Risk  11/25/2015 08/27/2015 05/27/2015 02/24/2015  Falls in the past year? No No No No     Functional  Status Survey: Is the patient deaf or have difficulty hearing?: No Does the patient have difficulty seeing, even when wearing glasses/contacts?: No Does the patient have difficulty concentrating, remembering, or making decisions?: No Does the patient have difficulty walking or climbing stairs?: No Does the patient have difficulty dressing or bathing?: No Does the patient have difficulty doing errands alone such as visiting a doctor's office or shopping?: No    Assessment & Plan  1. Controlled type 2 diabetes mellitus with microalbuminuria, without long-term current use  of insulin ([place])  - POCT HgB A1C going up, now at 7, adjusting dose of Jentadueto she will also resume her diet - POCT UA - Microalbumin - INVOKANA 300 MG TABS tablet; Take 1 tablet (300 mg total) by mouth daily before breakfast.  Dispense: 90 tablet; Refill: 1 - Linagliptin-Metformin HCl ER (JENTADUETO XR) 2.12-998 MG TB24; Take 2 tablets by mouth daily.  Dispense: 180 tablet; Refill: 1  2. Hypertension, benign  - amLODipine-olmesartan (AZOR) 5-40 MG tablet; Take 1 tablet by mouth  daily  Dispense: 90 tablet; Refill: 1  3. Perennial allergic rhinitis  - montelukast (SINGULAIR) 10 MG tablet; Take 1 tablet (10 mg total) by mouth daily.  Dispense: 90 tablet; Refill: 1  4. GERD without esophagitis  Doing well at this time  5. Controlled gout  - allopurinol (ZYLOPRIM) 300 MG tablet; Take 1 tablet (300 mg total) by mouth daily.  Dispense: 90 tablet; Refill: 1  6. Dyslipidemia  Continue statin therapy    7. Primary osteoarthritis of both hips  Discussed referral to Ortho but she would like to hold off for now

## 2016-02-24 ENCOUNTER — Ambulatory Visit (INDEPENDENT_AMBULATORY_CARE_PROVIDER_SITE_OTHER): Payer: 59 | Admitting: Family Medicine

## 2016-02-24 ENCOUNTER — Encounter: Payer: Self-pay | Admitting: Family Medicine

## 2016-02-24 VITALS — BP 128/76 | HR 98 | Resp 18 | Ht 66.0 in | Wt 223.5 lb

## 2016-02-24 DIAGNOSIS — E669 Obesity, unspecified: Secondary | ICD-10-CM

## 2016-02-24 DIAGNOSIS — E1129 Type 2 diabetes mellitus with other diabetic kidney complication: Secondary | ICD-10-CM | POA: Diagnosis not present

## 2016-02-24 DIAGNOSIS — M545 Low back pain, unspecified: Secondary | ICD-10-CM

## 2016-02-24 DIAGNOSIS — M109 Gout, unspecified: Secondary | ICD-10-CM

## 2016-02-24 DIAGNOSIS — E785 Hyperlipidemia, unspecified: Secondary | ICD-10-CM

## 2016-02-24 DIAGNOSIS — I10 Essential (primary) hypertension: Secondary | ICD-10-CM

## 2016-02-24 DIAGNOSIS — K760 Fatty (change of) liver, not elsewhere classified: Secondary | ICD-10-CM | POA: Diagnosis not present

## 2016-02-24 DIAGNOSIS — Z113 Encounter for screening for infections with a predominantly sexual mode of transmission: Secondary | ICD-10-CM | POA: Diagnosis not present

## 2016-02-24 DIAGNOSIS — N898 Other specified noninflammatory disorders of vagina: Secondary | ICD-10-CM | POA: Diagnosis not present

## 2016-02-24 DIAGNOSIS — R809 Proteinuria, unspecified: Secondary | ICD-10-CM

## 2016-02-24 DIAGNOSIS — M160 Bilateral primary osteoarthritis of hip: Secondary | ICD-10-CM | POA: Diagnosis not present

## 2016-02-24 LAB — POCT URINALYSIS DIPSTICK
Bilirubin, UA: NEGATIVE
Glucose, UA: 2000
KETONES UA: NEGATIVE
Leukocytes, UA: NEGATIVE
Nitrite, UA: NEGATIVE
PH UA: 6
SPEC GRAV UA: 1.025
Urobilinogen, UA: 0.2

## 2016-02-24 LAB — POCT WET PREP (WET MOUNT)

## 2016-02-24 LAB — POCT GLYCOSYLATED HEMOGLOBIN (HGB A1C): Hemoglobin A1C: 7.6

## 2016-02-24 MED ORDER — NAPROXEN 500 MG PO TABS: 500.0000 mg | ORAL_TABLET | Freq: Two times a day (BID) | ORAL | Status: DC

## 2016-02-24 MED ORDER — EMPAGLIFLOZIN 25 MG PO TABS: 25.0000 mg | ORAL_TABLET | Freq: Every day | ORAL | Status: DC

## 2016-02-24 MED ORDER — FLUCONAZOLE 150 MG PO TABS: 150.0000 mg | ORAL_TABLET | ORAL | Status: DC

## 2016-02-24 MED ORDER — COLESEVELAM HCL 625 MG PO TABS: 1875.0000 mg | ORAL_TABLET | Freq: Two times a day (BID) | ORAL | Status: DC

## 2016-02-24 MED ORDER — ATORVASTATIN CALCIUM 20 MG PO TABS: 20.0000 mg | ORAL_TABLET | Freq: Every day | ORAL | Status: DC

## 2016-02-24 NOTE — Progress Notes (Signed)
Name: Maria Cruz   MRN: [id number]    DOB: 1956/04/27   Date:02/24/2016       Progress Note  Subjective  Chief Complaint  Chief Complaint  Patient presents with  . Medication Refill    3 month F/U  . Diabetes    Checks daily Highest- 143 Lowest-123  . Hypertension    Lower Extremity swelling  . Gastroesophageal Reflux  . Allergic Rhinitis   . Hyperlipidemia  . Vaginal Discharge    Onset-2-3 weeks, brownish discharge, pain on internal left side of vagina    HPI  DM II with proteinuria: urine micro is up to 50 again, continue ARB , taking Invokana and Jentadueto, no diarrhea with it. HgbA1C is above goal- , she is denies polyphagia, polydipsia or polyuria, but has noticed some visual changes - blurred - needs to have eye exam. She is going downtown for internet gaming - and they give her free sodas and also Cheez-its and Beef Jerkey's. She is also drinking cranberry juice  Hyperlipidemia: she is taking Atorvastatin and [place]col ( but she skips medication because of cost ) no longer has muscle aches.  HTN: taking medication, bp is at goal. Denies chest pain or palpations , no SOB. She denies side effects of medication   Gout: no recent episodes, taking allopurinol daily, denies side effects.  Last uric acid was elevated  Obesity: needs to resume diet and exercise more often.  OA hips: worse on the right side, taking Tylenol every night, and Tramadol very seldom, she has an antalgic gait that is worse after work. Discussed referral to Ortho, she would like to hold off , she states meditation has improved her symptoms.   Vaginal discharge: she is status post-hysterectomy and had intercourse - (with boyfriend , but she does not trust him) and has noticed brownish vaginal discharge, but no smell. No dysuria or increase in frequency. She is worried that it may be a condom that was left in her vagina. She also has noticed some left lower back pain, no radiation. She has chronic left  vaginal pain and left lower quadrant pain since hysterectomy   Patient Active Problem List   Diagnosis Date Noted  . Controlled type 2 diabetes mellitus with microalbuminuria, without long-term current use of insulin ([place]) 02/24/2015  . Vitamin D deficiency 02/24/2015  . Perennial allergic rhinitis 02/24/2015  . GERD without esophagitis 02/24/2015  . Hypertension, benign 02/24/2015  . Controlled gout 02/24/2015  . Fatty liver 02/24/2015  . Callus of foot 02/24/2015  . Primary osteoarthritis of right hip 02/24/2015  . History of carpal tunnel syndrome 02/24/2015  . Obesity (BMI 30-39.9) 02/24/2015  . Umbilical hernia without obstruction or gangrene 02/24/2015  . Intermittent low back pain 02/24/2015  . History of colon polyps 02/24/2015  . Dyslipidemia 02/24/2015    Past Surgical History  Procedure Laterality Date  . Abdominal hysterectomy  2012  . Appendectomy  2012  . Dilation and curettage of uterus  2007  . Breast biopsy Right 07/12/2012    Negative  . Tubal ligation      Family History  Problem Relation Age of Onset  . Multiple sclerosis Mother   . Heart attack Father   . Kidney failure Sister   . Heart attack Brother   . Diabetes Daughter   . CAD Sister   . Arthritis Sister     RA  . Lupus Sister     Social History   Social History  . Marital  Status: Single    Spouse Name: N/A  . Number of Children: N/A  . Years of Education: N/A   Occupational History  . Not on file.   Social History Main Topics  . Smoking status: Never Smoker   . Smokeless tobacco: Never Used  . Alcohol Use: No  . Drug Use: No  . Sexual Activity: Yes   Other Topics Concern  . Not on file   Social History Narrative     Current outpatient prescriptions:  .  allopurinol (ZYLOPRIM) 300 MG tablet, Take 1 tablet (300 mg total) by mouth daily., Disp: 90 tablet, Rfl: 1 .  amLODipine-olmesartan (AZOR) 5-40 MG tablet, Take 1 tablet by mouth  daily, Disp: 90 tablet, Rfl: 1 .   atorvastatin (LIPITOR) 20 MG tablet, Take 1 tablet (20 mg total) by mouth daily., Disp: 90 tablet, Rfl: 1 .  colesevelam (WELCHOL) 625 MG tablet, Take 3 tablets (1,875 mg total) by mouth 2 (two) times daily with a meal., Disp: 540 tablet, Rfl: 1 .  Glucose Blood (FREESTYLE LITE TEST VI), , Disp: , Rfl:  .  Lancets (FREESTYLE) lancets, , Disp: , Rfl:  .  levocetirizine (XYZAL) 5 MG tablet, Take 1 tablet (5 mg total) by mouth every evening., Disp: 90 tablet, Rfl: 1 .  Linagliptin-Metformin HCl ER (JENTADUETO XR) 2.12-998 MG TB24, Take 2 tablets by mouth daily., Disp: 180 tablet, Rfl: 1 .  montelukast (SINGULAIR) 10 MG tablet, Take 1 tablet (10 mg total) by mouth daily., Disp: 90 tablet, Rfl: 1 .  traMADol (ULTRAM) 50 MG tablet, Take 1 tablet by mouth daily., Disp: , Rfl:  .  empagliflozin (JARDIANCE) 25 MG TABS tablet, Take 25 mg by mouth daily. In place of invokana, Disp: 30 tablet, Rfl: 0 .  fluconazole (DIFLUCAN) 150 MG tablet, Take 1 tablet (150 mg total) by mouth every other day., Disp: 3 tablet, Rfl: 0 .  naproxen (NAPROSYN) 500 MG tablet, Take 1 tablet (500 mg total) by mouth 2 (two) times daily with a meal., Disp: 30 tablet, Rfl: 0  Allergies  Allergen Reactions  . Ace Inhibitors Cough  . Lipofen [Fenofibrate] Other (See Comments)    Localized drug reaction, blister on back  . Penicillins Itching     ROS  Constitutional: Negative for fever or weight change.  Respiratory: Negative for cough and shortness of breath.   Cardiovascular: Negative for chest pain or palpitations.  Gastrointestinal: Negative for abdominal pain, no bowel changes.  Musculoskeletal: positive for gait problem but no  joint swelling.  Skin: Negative for rash.  Neurological: Negative for dizziness or headache.  No other specific complaints in a complete review of systems (except as listed in HPI above).  Objective  Filed Vitals:   02/24/16 1113  BP: 128/76  Pulse: 98  Resp: 18  Height: 5\' 6"  (1.676 m)   Weight: 223 lb 8 oz (101.379 kg)  SpO2: 98%    Body mass index is 36.09 kg/(m^2).  Physical Exam  Constitutional: Patient appears well-developed and well-nourished. Obese  No distress.  HEENT: head atraumatic, normocephalic, pupils equal and reactive to light, neck supple, throat within normal limits Cardiovascular: Normal rate, regular rhythm and normal heart sounds.  No murmur heard. No BLE edema. Pulmonary/Chest: Effort normal and breath sounds normal. No respiratory distress. Abdominal: Soft.  There is no tenderness. Psychiatric: Patient has a normal mood and affect. behavior is normal. Judgment and thought content normal. Muscular Skeletal: negative straight leg raise, mild pain during palpation of left lower  back  Recent Results (from the past 2160 hour(s))  POCT urinalysis dipstick     Status: Abnormal   Collection Time: 02/24/16 11:16 AM  Result Value Ref Range   Color, UA dark    Clarity, UA clear    Glucose, UA 2000    Bilirubin, UA negative    Ketones, UA negative    Spec Grav, UA 1.025    Blood, UA hemolyzed trace    pH, UA 6.0    Protein, UA trace    Urobilinogen, UA 0.2    Nitrite, UA negative    Leukocytes, UA Negative Negative  POCT HgB A1C     Status: Abnormal   Collection Time: 02/24/16 11:18 AM  Result Value Ref Range   Hemoglobin A1C 7.6   POCT Wet Prep [facility] [place])     Status: Abnormal   Collection Time: 02/24/16  2:46 PM  Result Value Ref Range   Source Wet Prep POC vaginal    WBC, Wet Prep HPF POC few    Bacteria Wet Prep HPF POC None None, Few, Too numerous to count   BACTERIA WET PREP MORPHOLOGY POC     Clue Cells Wet Prep HPF POC Moderate (A) None, Too numerous to count   Clue Cells Wet Prep [place] POC     Yeast Wet Prep HPF POC Moderate    KOH Wet Prep POC     Trichomonas Wet Prep HPF POC none     Diabetic Foot Exam: Diabetic Foot Exam - Simple   Simple Foot Form  Diabetic Foot exam was performed with the following findings:  Yes  02/24/2016 11:43 AM  Visual Inspection  No deformities, no ulcerations, no other skin breakdown bilaterally:  Yes  Sensation Testing  Intact to touch and monofilament testing bilaterally:  Yes  Pulse Check  Posterior Tibialis and Dorsalis pulse intact bilaterally:  Yes  Comments       PHQ2/9: Depression screen [facility] 2/9 02/24/2016 11/25/2015 08/27/2015 05/27/2015 02/24/2015  Decreased Interest 0 0 0 0 0  Down, Depressed, Hopeless 0 0 0 0 0  PHQ - 2 Score 0 0 0 0 0     Fall Risk: Fall Risk  02/24/2016 11/25/2015 08/27/2015 05/27/2015 02/24/2015  Falls in the past year? No No No No No     Functional Status Survey: Is the patient deaf or have difficulty hearing?: No Does the patient have difficulty seeing, even when wearing glasses/contacts?: No Does the patient have difficulty concentrating, remembering, or making decisions?: No Does the patient have difficulty walking or climbing stairs?: No Does the patient have difficulty dressing or bathing?: No Does the patient have difficulty doing errands alone such as visiting a doctor's office or shopping?: No    Assessment & Plan  1. Controlled type 2 diabetes mellitus with microalbuminuria, without long-term current use of insulin ([place])  She is drinking sodas and having unhealthy snacks, we will switch from [place]ok[place] to [place] because of increase risk of amputation, and she will resume diet before we change or add any other medications - POCT HgB A1C - empagliflozin (JARDIANCE) 25 MG TABS tablet; Take 25 mg by mouth daily. In place of invokana  Dispense: 30 tablet; Refill: 0  2. Vaginal discharge  - POCT urinalysis dipstick -Wet prep  3. Fatty liver  Recheck labs  4. Hypertension, benign  - Comprehensive metabolic panel  5. Obesity (BMI 30-39.9)  Discussed with the patient the risk posed by an increased BMI. Discussed importance of portion control, calorie  counting and at least 150 minutes of physical activity weekly. Avoid  sweet beverages and drink more water. Eat at least 6 servings of fruit and vegetables daily   6. Dyslipidemia  - colesevelam (WELCHOL) 625 MG tablet; Take 3 tablets (1,875 mg total) by mouth 2 (two) times daily with a meal.  Dispense: 540 tablet; Refill: 1 - atorvastatin (LIPITOR) 20 MG tablet; Take 1 tablet (20 mg total) by mouth daily.  Dispense: 90 tablet; Refill: 1 - Lipid panel  7. Controlled gout  - Uric acid  8. Hypercalcemia  - Comprehensive metabolic panel  9. Primary osteoarthritis of both hips  Taking Tramadol very seldom  6. Dyslipidemia  - colesevelam (WELCHOL) 625 MG tablet; Take 3 tablets (1,875 mg total) by mouth 2 (two) times daily with a meal.  Dispense: 540 tablet; Refill: 1 - atorvastatin (LIPITOR) 20 MG tablet; Take 1 tablet (20 mg total) by mouth daily.  Dispense: 90 tablet; Refill: 1 - Lipid panel  7. Controlled gout  - Uric acid  8. Hypercalcemia  - Comprehensive metabolic panel  8. Hypercalcemia  - Comprehensive metabolic panel  9. Primary osteoarthritis of both hips  Taking Tramadol very seldom  10. Routine screening for STI (sexually transmitted infection)  - Chlamydia/Gonococcus/Trichomonas, NAA - HIV antibody - RPR - POCT Wet Prep ([place])  11. Left-sided low back pain without sciatica  - naproxen (NAPROSYN) 500 MG tablet; Take 1 tablet (500 mg total) by mouth 2 (two) times daily with a meal.  Dispense: 30 tablet; Refill: 0

## 2016-02-26 LAB — CHLAMYDIA/GONOCOCCUS/TRICHOMONAS, NAA
CHLAMYDIA BY NAA: NEGATIVE
Gonococcus by NAA: NEGATIVE
TRICH VAG BY NAA: NEGATIVE

## 2016-02-27 LAB — COMPREHENSIVE METABOLIC PANEL
ALT: 35 IU/L — AB (ref 0–32)
AST: 49 IU/L — ABNORMAL HIGH (ref 0–40)
Albumin/Globulin Ratio: 1.8 (ref 1.2–2.2)
Albumin: 4.6 g/dL (ref 3.5–5.5)
Alkaline Phosphatase: 130 IU/L — ABNORMAL HIGH (ref 39–117)
BILIRUBIN TOTAL: 0.3 mg/dL (ref 0.0–1.2)
BUN/Creatinine Ratio: 21 (ref 9–23)
BUN: 13 mg/dL (ref 6–24)
CALCIUM: 10 mg/dL (ref 8.7–10.2)
CHLORIDE: 100 mmol/L (ref 96–106)
CO2: 24 mmol/L (ref 18–29)
Creatinine, Ser: 0.61 mg/dL (ref 0.57–1.00)
GFR calc non Af Amer: 100 mL/min/{1.73_m2} (ref >59)
GFR, EST AFRICAN AMERICAN: 116 mL/min/{1.73_m2} (ref >59)
GLUCOSE: 130 mg/dL — AB (ref 65–99)
Globulin, Total: 2.6 g/dL (ref 1.5–4.5)
Potassium: 4.1 mmol/L (ref 3.5–5.2)
Sodium: 141 mmol/L (ref 134–144)
TOTAL PROTEIN: 7.2 g/dL (ref 6.0–8.5)

## 2016-02-27 LAB — URIC ACID: URIC ACID: 7.3 mg/dL — AB (ref 2.5–7.1)

## 2016-02-27 LAB — LIPID PANEL
Chol/HDL Ratio: 4.3 ratio units (ref 0.0–4.4)
Cholesterol, Total: 172 mg/dL (ref 100–199)
HDL: 40 mg/dL (ref >39)
LDL Calculated: 60 mg/dL (ref 0–99)
Triglycerides: 358 mg/dL — ABNORMAL HIGH (ref 0–149)
VLDL Cholesterol Cal: 72 mg/dL — ABNORMAL HIGH (ref 5–40)

## 2016-02-27 LAB — RPR: RPR Ser Ql: NONREACTIVE

## 2016-02-27 LAB — HIV ANTIBODY (ROUTINE TESTING W REFLEX): HIV Screen 4th Generation wRfx: NONREACTIVE

## 2016-03-02 ENCOUNTER — Other Ambulatory Visit: Payer: Self-pay | Admitting: Family Medicine

## 2016-03-02 DIAGNOSIS — R809 Proteinuria, unspecified: Principal | ICD-10-CM

## 2016-03-02 DIAGNOSIS — E1129 Type 2 diabetes mellitus with other diabetic kidney complication: Secondary | ICD-10-CM

## 2016-03-02 NOTE — Telephone Encounter (Signed)
Patient requesting refill. 

## 2016-03-16 ENCOUNTER — Other Ambulatory Visit: Payer: Self-pay | Admitting: Family Medicine

## 2016-03-16 DIAGNOSIS — I10 Essential (primary) hypertension: Secondary | ICD-10-CM

## 2016-03-16 NOTE — Telephone Encounter (Signed)
Patient requesting refill of Azor 5-40 to [facility].

## 2016-03-18 ENCOUNTER — Other Ambulatory Visit: Payer: Self-pay | Admitting: Family Medicine

## 2016-03-18 DIAGNOSIS — J3089 Other allergic rhinitis: Secondary | ICD-10-CM

## 2016-03-18 DIAGNOSIS — E1129 Type 2 diabetes mellitus with other diabetic kidney complication: Secondary | ICD-10-CM

## 2016-03-18 DIAGNOSIS — N898 Other specified noninflammatory disorders of vagina: Secondary | ICD-10-CM

## 2016-03-18 DIAGNOSIS — R809 Proteinuria, unspecified: Secondary | ICD-10-CM

## 2016-03-18 NOTE — Telephone Encounter (Signed)
Patient requesting refill of Xyzal, Diflucan, Singular and Jentadueto be sent to [facility].

## 2016-04-14 ENCOUNTER — Other Ambulatory Visit: Payer: Self-pay | Admitting: Family Medicine

## 2016-04-14 DIAGNOSIS — M109 Gout, unspecified: Secondary | ICD-10-CM

## 2016-04-14 NOTE — Telephone Encounter (Signed)
Patient requesting refill of Allopurinol be sent to [facility].

## 2016-05-04 ENCOUNTER — Other Ambulatory Visit: Payer: Self-pay

## 2016-05-04 DIAGNOSIS — E1129 Type 2 diabetes mellitus with other diabetic kidney complication: Secondary | ICD-10-CM

## 2016-05-04 DIAGNOSIS — R809 Proteinuria, unspecified: Principal | ICD-10-CM

## 2016-05-04 MED ORDER — EMPAGLIFLOZIN 25 MG PO TABS: ORAL_TABLET | ORAL | 0 refills | Status: DC

## 2016-05-04 NOTE — Telephone Encounter (Signed)
Patient requesting refill of Jardiance to [facility].

## 2016-05-18 ENCOUNTER — Other Ambulatory Visit: Payer: Self-pay | Admitting: Family Medicine

## 2016-05-18 DIAGNOSIS — E785 Hyperlipidemia, unspecified: Secondary | ICD-10-CM

## 2016-05-19 ENCOUNTER — Other Ambulatory Visit: Payer: Self-pay

## 2016-05-19 DIAGNOSIS — E1129 Type 2 diabetes mellitus with other diabetic kidney complication: Secondary | ICD-10-CM

## 2016-05-19 DIAGNOSIS — R809 Proteinuria, unspecified: Principal | ICD-10-CM

## 2016-05-19 DIAGNOSIS — I10 Essential (primary) hypertension: Secondary | ICD-10-CM

## 2016-05-19 MED ORDER — EMPAGLIFLOZIN 25 MG PO TABS: ORAL_TABLET | ORAL | 0 refills | Status: DC

## 2016-05-19 MED ORDER — AMLODIPINE-OLMESARTAN 5-40 MG PO TABS: 1.0000 | ORAL_TABLET | Freq: Every day | ORAL | 0 refills | Status: DC

## 2016-05-19 NOTE — Telephone Encounter (Signed)
Patient requesting refill of Azor and Jardiance to [facility].

## 2016-05-26 ENCOUNTER — Encounter: Payer: Self-pay | Admitting: Family Medicine

## 2016-05-26 ENCOUNTER — Ambulatory Visit (INDEPENDENT_AMBULATORY_CARE_PROVIDER_SITE_OTHER): Payer: 59 | Admitting: Family Medicine

## 2016-05-26 VITALS — BP 128/68 | HR 100 | Temp 97.5°F | Resp 16 | Ht 66.0 in | Wt 219.1 lb

## 2016-05-26 DIAGNOSIS — E1129 Type 2 diabetes mellitus with other diabetic kidney complication: Secondary | ICD-10-CM | POA: Diagnosis not present

## 2016-05-26 DIAGNOSIS — M109 Gout, unspecified: Secondary | ICD-10-CM | POA: Diagnosis not present

## 2016-05-26 DIAGNOSIS — E785 Hyperlipidemia, unspecified: Secondary | ICD-10-CM

## 2016-05-26 DIAGNOSIS — R809 Proteinuria, unspecified: Secondary | ICD-10-CM | POA: Diagnosis not present

## 2016-05-26 DIAGNOSIS — N760 Acute vaginitis: Secondary | ICD-10-CM

## 2016-05-26 DIAGNOSIS — M160 Bilateral primary osteoarthritis of hip: Secondary | ICD-10-CM

## 2016-05-26 DIAGNOSIS — I10 Essential (primary) hypertension: Secondary | ICD-10-CM

## 2016-05-26 LAB — POCT GLYCOSYLATED HEMOGLOBIN (HGB A1C): HEMOGLOBIN A1C: 6.5

## 2016-05-26 MED ORDER — AMLODIPINE-OLMESARTAN 5-40 MG PO TABS: 1.0000 | ORAL_TABLET | Freq: Every day | ORAL | 0 refills | Status: DC

## 2016-05-26 MED ORDER — FLUCONAZOLE 150 MG PO TABS: 150.0000 mg | ORAL_TABLET | Freq: Every day | ORAL | 0 refills | Status: DC | PRN

## 2016-05-26 MED ORDER — ACETAMINOPHEN 500 MG PO TABS: 500.0000 mg | ORAL_TABLET | Freq: Four times a day (QID) | ORAL | 0 refills | Status: DC | PRN

## 2016-05-26 MED ORDER — EMPAGLIFLOZIN 25 MG PO TABS: ORAL_TABLET | ORAL | 0 refills | Status: DC

## 2016-05-26 NOTE — Progress Notes (Signed)
Name: Maria Cruz   MRN: [id number]    DOB: [birth year]   Date:05/26/2016       Progress Note  Subjective  Chief Complaint  Chief Complaint  Patient presents with  . Diabetes    140 high 107 low 120-123 avg pt has been walking 4 days per week and has lost weight  . Hyperlipidemia  . Obesity  . Hypertension    HPI  DM II with proteinuria: urine micro is up to 50 again, continue ARB , taking Jardiance and Jentadueto, no diarrhea with it. HgbA1C is at goal now at 6.5%. She is denies polyphagia, polydipsia or polyuria, blurred vision has improved. She stopped gamming and is now walking with her friend four days a week, one mile and lasts about 45 minutes  Hyperlipidemia: she is taking Atorvastatin and Whelcol , she has been more consistent taking it now, no longer has muscle aches.  HTN: taking medication, bp is at goal. Denies chest pain or palpations , no SOB. She denies side effects of medication.   Gout: no recent episodes, taking allopurinol daily, denies side effects.  Last uric acid was elevated, but she is back on Allopurinol daily   Obesity: she lost 5 lbs since last visit, doing well, exercising and doing well at this time  OA hips: worse on the right side, taking Tylenol mostly at night, walking and right hip pain has improved, no longer taking Tramadol, she has an antalgic gait after work.  Discussed referral to Ortho, she would like to hold off , she states meditation has improved her symptoms.    Patient Active Problem List   Diagnosis Date Noted  . Controlled type 2 diabetes mellitus with microalbuminuria, without long-term current use of insulin ([place]) 02/24/2015  . Vitamin D deficiency 02/24/2015  . Perennial allergic rhinitis 02/24/2015  . GERD without esophagitis 02/24/2015  . Hypertension, benign 02/24/2015  . Controlled gout 02/24/2015  . Fatty liver 02/24/2015  . Callus of foot 02/24/2015  . Primary osteoarthritis of right hip 02/24/2015  . History of  carpal tunnel syndrome 02/24/2015  . Obesity (BMI 30-39.9) 02/24/2015  . Umbilical hernia without obstruction or gangrene 02/24/2015  . Intermittent low back pain 02/24/2015  . History of colon polyps 02/24/2015  . Dyslipidemia 02/24/2015    Past Surgical History:  Procedure Laterality Date  . ABDOMINAL HYSTERECTOMY  2012  . APPENDECTOMY  2012  . BREAST BIOPSY Right 07/12/2012   Negative  . DILATION AND CURETTAGE OF UTERUS  2007  . TUBAL LIGATION      Family History  Problem Relation Age of Onset  . Multiple sclerosis Mother   . Heart attack Father   . Kidney failure Sister   . Heart attack Brother   . Diabetes Daughter   . CAD Sister   . Arthritis Sister     RA  . Lupus Sister     Social History   Social History  . Marital status: Single    Spouse name: N/A  . Number of children: N/A  . Years of education: N/A   Occupational History  . Not on file.   Social History Main Topics  . Smoking status: Never Smoker  . Smokeless tobacco: Never Used  . Alcohol use No  . Drug use: No  . Sexual activity: Yes   Other Topics Concern  . Not on file   Social History Narrative  . No narrative on file     Current Outpatient Prescriptions:  .  acetaminophen (TYLENOL) 500 MG tablet, Take 1 tablet (500 mg total) by mouth every 6 (six) hours as needed., Disp: 90 tablet, Rfl: 0 .  allopurinol (ZYLOPRIM) 300 MG tablet, Take 1 tablet by mouth  daily, Disp: 90 tablet, Rfl: 1 .  amLODipine-olmesartan (AZOR) 5-40 MG tablet, Take 1 tablet by mouth daily., Disp: 90 tablet, Rfl: 0 .  atorvastatin (LIPITOR) 20 MG tablet, TAKE 1 TABLET BY MOUTH  DAILY, Disp: 90 tablet, Rfl: 1 .  colesevelam (WELCHOL) 625 MG tablet, Take 3 tablets (1,875 mg total) by mouth 2 (two) times daily with a meal., Disp: 540 tablet, Rfl: 1 .  empagliflozin (JARDIANCE) 25 MG TABS tablet, TAKE 1 TABLET BY MOUTH  DAILY., Disp: 90 tablet, Rfl: 0 .  fluconazole (DIFLUCAN) 150 MG tablet, Take 1 tablet (150 mg total)  by mouth daily as needed. Max of 3 doses at a time, Disp: 12 tablet, Rfl: 0 .  Glucose Blood (FREESTYLE LITE TEST VI), , Disp: , Rfl:  .  JENTADUETO XR 2.12-998 MG TB24, Take 2 tablets by mouth  daily, Disp: 180 tablet, Rfl: 1 .  Lancets (FREESTYLE) lancets, , Disp: , Rfl:  .  levocetirizine (XYZAL) 5 MG tablet, Take 1 tablet by mouth  every evening, Disp: 90 tablet, Rfl: 1 .  montelukast (SINGULAIR) 10 MG tablet, Take 1 tablet by mouth  daily, Disp: 90 tablet, Rfl: 1  Allergies  Allergen Reactions  . Ace Inhibitors Cough  . Lipofen [Fenofibrate] Other (See Comments)    Localized drug reaction, blister on back  . Penicillins Itching     ROS  Constitutional: Negative for fever, positive for mild  weight change.  Respiratory: Negative for cough and shortness of breath.   Cardiovascular: Negative for chest pain or palpitations.  Gastrointestinal: Negative for abdominal pain, no bowel changes.  Musculoskeletal: Positive  for gait problem or joint swelling.  Skin: Negative for rash.  Neurological: Negative for dizziness or headache.  No other specific complaints in a complete review of systems (except as listed in HPI above).  Objective  Vitals:   05/26/16 0908  BP: 128/68  Pulse: 100  Resp: 16  Temp: 97.5 F (36.4 C)  TempSrc: Oral  SpO2: 97%  Weight: 219 lb 2 oz (99.4 kg)  Height: 5\' 6"  (1.676 m)    Body mass index is 35.37 kg/m.  Physical Exam  Constitutional: Patient appears well-developed and well-nourished. Obese No distress.  HEENT: head atraumatic, normocephalic, pupils equal and reactive to light,  neck supple, throat within normal limits Cardiovascular: Normal rate, regular rhythm and normal heart sounds.  No murmur heard. No BLE edema. Pulmonary/Chest: Effort normal and breath sounds normal. No respiratory distress. Abdominal: Soft.  There is no tenderness. Psychiatric: Patient has a normal mood and affect. behavior is normal. Judgment and thought content  normal. Muscular Skeletal: decrease rom of both hips  Recent Results (from the past 2160 hour(s))  POCT HgB A1C     Status: None   Collection Time: 05/26/16  9:25 AM  Result Value Ref Range   Hemoglobin A1C 6.5      PHQ2/9: Depression screen [facility] 2/9 05/26/2016 02/24/2016 11/25/2015 08/27/2015 05/27/2015  Decreased Interest 0 0 0 0 0  Down, Depressed, Hopeless 0 0 0 0 0  PHQ - 2 Score 0 0 0 0 0    Fall Risk: Fall Risk  05/26/2016 02/24/2016 11/25/2015 08/27/2015 05/27/2015  Falls in the past year? No No No No No     Functional Status Survey:  Is the patient deaf or have difficulty hearing?: No Does the patient have difficulty seeing, even when wearing glasses/contacts?: No Does the patient have difficulty concentrating, remembering, or making decisions?: No Does the patient have difficulty walking or climbing stairs?: No Does the patient have difficulty dressing or bathing?: No Does the patient have difficulty doing errands alone such as visiting a doctor's office or shopping?: No    Assessment & Plan  1. Controlled type 2 diabetes mellitus with microalbuminuria, without long-term current use of insulin (HCC)  - POCT HgB A1C - empagliflozin (JARDIANCE) 25 MG TABS tablet; TAKE 1 TABLET BY MOUTH  DAILY.  Dispense: 90 tablet; Refill: 0  2. Hypertension, benign  - amLODipine-olmesartan (AZOR) 5-40 MG tablet; Take 1 tablet by mouth daily.  Dispense: 90 tablet; Refill: 0  3. Dyslipidemia  Continue medications   4. Primary osteoarthritis of both hips  May increase Tylenol to three times daily Exercise has improved symptoms  - acetaminophen (TYLENOL) 500 MG tablet; Take 1 tablet (500 mg total) by mouth every 6 (six) hours as needed.  Dispense: 90 tablet; Refill: 0  5. Controlled gout  Doing well   6. Recurrent vaginitis  - fl uconazole (DIFLUCAN) 150 MG tablet; Take 1 tablet (150 mg total) by mouth daily as needed. Max of 3 doses at a time  Dispense: 12 tablet; Refill:  0

## 2016-06-07 ENCOUNTER — Other Ambulatory Visit: Payer: Self-pay | Admitting: Family Medicine

## 2016-06-07 DIAGNOSIS — E785 Hyperlipidemia, unspecified: Secondary | ICD-10-CM

## 2016-06-07 NOTE — Telephone Encounter (Signed)
Patient requesting refill of Welchol to [facility].

## 2016-08-26 ENCOUNTER — Ambulatory Visit: Payer: 59 | Admitting: Family Medicine

## 2016-09-02 ENCOUNTER — Ambulatory Visit (INDEPENDENT_AMBULATORY_CARE_PROVIDER_SITE_OTHER): Payer: 59 | Admitting: Family Medicine

## 2016-09-02 ENCOUNTER — Encounter: Payer: Self-pay | Admitting: Family Medicine

## 2016-09-02 VITALS — BP 118/68 | HR 94 | Temp 98.7°F | Resp 16 | Ht 66.0 in | Wt 218.5 lb

## 2016-09-02 DIAGNOSIS — M160 Bilateral primary osteoarthritis of hip: Secondary | ICD-10-CM

## 2016-09-02 DIAGNOSIS — E1169 Type 2 diabetes mellitus with other specified complication: Secondary | ICD-10-CM

## 2016-09-02 DIAGNOSIS — J3089 Other allergic rhinitis: Secondary | ICD-10-CM | POA: Diagnosis not present

## 2016-09-02 DIAGNOSIS — I10 Essential (primary) hypertension: Secondary | ICD-10-CM

## 2016-09-02 DIAGNOSIS — M109 Gout, unspecified: Secondary | ICD-10-CM | POA: Diagnosis not present

## 2016-09-02 DIAGNOSIS — N760 Acute vaginitis: Secondary | ICD-10-CM | POA: Diagnosis not present

## 2016-09-02 DIAGNOSIS — M5126 Other intervertebral disc displacement, lumbar region: Secondary | ICD-10-CM | POA: Diagnosis not present

## 2016-09-02 DIAGNOSIS — R809 Proteinuria, unspecified: Secondary | ICD-10-CM | POA: Diagnosis not present

## 2016-09-02 DIAGNOSIS — E1129 Type 2 diabetes mellitus with other diabetic kidney complication: Secondary | ICD-10-CM

## 2016-09-02 DIAGNOSIS — E785 Hyperlipidemia, unspecified: Secondary | ICD-10-CM | POA: Diagnosis not present

## 2016-09-02 DIAGNOSIS — M4316 Spondylolisthesis, lumbar region: Secondary | ICD-10-CM | POA: Insufficient documentation

## 2016-09-02 LAB — POCT GLYCOSYLATED HEMOGLOBIN (HGB A1C): HEMOGLOBIN A1C: 6.8

## 2016-09-02 MED ORDER — OMEGA-3-ACID ETHYL ESTERS 1 G PO CAPS: 2.0000 g | ORAL_CAPSULE | Freq: Two times a day (BID) | ORAL | 1 refills | Status: DC

## 2016-09-02 MED ORDER — FLUCONAZOLE 150 MG PO TABS
150.0000 mg | ORAL_TABLET | Freq: Every day | ORAL | 0 refills | Status: DC | PRN
Start: 2016-09-02 — End: 2017-03-02

## 2016-09-02 MED ORDER — MONTELUKAST SODIUM 10 MG PO TABS: 10.0000 mg | ORAL_TABLET | Freq: Every day | ORAL | 1 refills | Status: DC

## 2016-09-02 MED ORDER — ATORVASTATIN CALCIUM 20 MG PO TABS: 20.0000 mg | ORAL_TABLET | Freq: Every day | ORAL | 1 refills | Status: DC

## 2016-09-02 MED ORDER — ALLOPURINOL 300 MG PO TABS: 300.0000 mg | ORAL_TABLET | Freq: Two times a day (BID) | ORAL | 1 refills | Status: DC

## 2016-09-02 MED ORDER — LEVOCETIRIZINE DIHYDROCHLORIDE 5 MG PO TABS: 5.0000 mg | ORAL_TABLET | Freq: Every evening | ORAL | 1 refills | Status: DC

## 2016-09-02 MED ORDER — EMPAGLIFLOZIN 25 MG PO TABS: ORAL_TABLET | ORAL | 1 refills | Status: DC

## 2016-09-02 MED ORDER — AMLODIPINE-OLMESARTAN 5-40 MG PO TABS: 1.0000 | ORAL_TABLET | Freq: Every day | ORAL | 0 refills | Status: DC

## 2016-09-02 MED ORDER — LINAGLIPTIN-METFORMIN HCL ER 2.5-1000 MG PO TB24: 2.0000 | ORAL_TABLET | Freq: Every day | ORAL | 1 refills | Status: DC

## 2016-09-02 NOTE — Progress Notes (Signed)
Name: Maria Cruz   MRN: [id number]    DOB: Mar 21, 1956   Date:09/02/2016       Progress Note  Subjective  Chief Complaint  Chief Complaint  Patient presents with  . Medication Refill  . Diabetes  . Hypertension  . Hyperlipidemia  . Obesity    HPI  DM II with proteinuria: urine micro is elevated continue ARB , taking Jardiance and Jentadueto, no diarrhea with it. HgbA1C is at goal now at 6.8%. She is denies polyphagia, polydipsia or polyuria, blurred vision is much better. She stopped gamming and is now walking with her friend four days a week, one mile and lasts about 45 minutes. She also has dyslipidemia, does not like Whelcol and we will try switching to Lovaza   Hyperlipidemia: she is taking Atorvastatin and Whelcol , we will change to Lovaza since triglyceride not at goal,  no longer has muscle aches.  HTN: taking medication, bp is at goal. Denies chest pain or palpations , no SOB. She denies side effects of medication.   Gout: no recent episodes, taking allopurinol daily, denies side effects. Last uric acid was elevated, but she is back on Allopurinol daily, and willing to go up on the dose   Obesity: weight is stable,  exercising and doing well at this time  OA hips: worse on the right side, taking Tylenol mostly at night, walking and right hip pain has improved, no longer taking Tramadol, she has an antalgic gait after work.  Discussed referral to Ortho and she is willing, dealing with pain daily, pain right now 8/10   Patient Active Problem List   Diagnosis Date Noted  . Controlled type 2 diabetes mellitus with microalbuminuria, without long-term current use of insulin ([place]) 02/24/2015  . Vitamin D deficiency 02/24/2015  . Perennial allergic rhinitis 02/24/2015  . GERD without esophagitis 02/24/2015  . Hypertension, benign 02/24/2015  . Controlled gout 02/24/2015  . Fatty liver 02/24/2015  . Callus of foot 02/24/2015  . Primary osteoarthritis of right hip  02/24/2015  . History of carpal tunnel syndrome 02/24/2015  . Obesity (BMI 30-39.9) 02/24/2015  . Umbilical hernia without obstruction or gangrene 02/24/2015  . Intermittent low back pain 02/24/2015  . History of colon polyps 02/24/2015  . Dyslipidemia 02/24/2015    Past Surgical History:  Procedure Laterality Date  . ABDOMINAL HYSTERECTOMY  2012  . APPENDECTOMY  2012  . BREAST BIOPSY Right 07/12/2012   Negative  . DILATION AND CURETTAGE OF UTERUS  2007  . TUBAL LIGATION      Family History  Problem Relation Age of Onset  . Multiple sclerosis Mother   . Heart attack Father   . Kidney failure Sister   . Heart attack Brother   . Diabetes Daughter   . CAD Sister   . Arthritis Sister     RA  . Lupus Sister     Social History   Social History  . Marital status: Single    Spouse name: N/A  . Number of children: N/A  . Years of education: N/A   Occupational History  . Not on file.   Social History Main Topics  . Smoking status: Never Smoker  . Smokeless tobacco: Never Used  . Alcohol use No  . Drug use: No  . Sexual activity: Yes   Other Topics Concern  . Not on file   Social History Narrative  . No narrative on file     Current Outpatient Prescriptions:  .  acetaminophen (  TYLENOL) 500 MG tablet, Take 1 tablet (500 mg total) by mouth every 6 (six) hours as needed., Disp: 90 tablet, Rfl: 0 .  allopurinol (ZYLOPRIM) 300 MG tablet, Take 1 tablet (300 mg total) by mouth 2 (two) times daily., Disp: 180 tablet, Rfl: 1 .  amLODipine-olmesartan (AZOR) 5-40 MG tablet, Take 1 tablet by mouth daily., Disp: 90 tablet, Rfl: 0 .  atorvastatin (LIPITOR) 20 MG tablet, Take 1 tablet (20 mg total) by mouth daily., Disp: 90 tablet, Rfl: 1 .  empagliflozin (JARDIANCE) 25 MG TABS tablet, TAKE 1 TABLET BY MOUTH  DAILY., Disp: 90 tablet, Rfl: 1 .  fluconazole (DIFLUCAN) 150 MG tablet, Take 1 tablet (150 mg total) by mouth daily as needed. Max of 3 doses at a time, Disp: 12 tablet,  Rfl: 0 .  Glucose Blood (FREESTYLE LITE TEST VI), , Disp: , Rfl:  .  Lancets (FREESTYLE) lancets, , Disp: , Rfl:  .  levocetirizine (XYZAL) 5 MG tablet, Take 1 tablet (5 mg total) by mouth every evening., Disp: 90 tablet, Rfl: 1 .  Linagliptin-Metformin HCl ER (JENTADUETO XR) 2.12-998 MG TB24, Take 2 tablets by mouth daily., Disp: 180 tablet, Rfl: 1 .  montelukast (SINGULAIR) 10 MG tablet, Take 1 tablet (10 mg total) by mouth daily., Disp: 90 tablet, Rfl: 1 .  omega-3 acid ethyl esters (LOVAZA) 1 g capsule, Take 2 capsules (2 g total) by mouth 2 (two) times daily., Disp: 360 capsule, Rfl: 1  Allergies  Allergen Reactions  . Ace Inhibitors Cough  . Lipofen [Fenofibrate] Other (See Comments)    Localized drug reaction, blister on back  . Penicillins Itching     ROS  Constitutional: Negative for fever or weight change.  Respiratory: Negative for cough and shortness of breath.   Cardiovascular: Negative for chest pain or palpitations.  Gastrointestinal: Negative for abdominal pain, no bowel changes.  Musculoskeletal: Positive  for gait problem but no joint swelling.  Skin: Negative for rash.  Neurological: Negative for dizziness or headache.  No other specific complaints in a complete review of systems (except as listed in HPI above).  Objective  Vitals:   09/02/16 1121  BP: 118/68  Pulse: 94  Resp: 16  Temp: 98.7 F (37.1 C)  SpO2: 99%  Weight: 218 lb 8 oz (99.1 kg)  Height: 5\' 6"  (1.676 m)    Body mass index is 35.27 kg/m.  Physical Exam  Constitutional: Patient appears well-developed and well-nourished. Obese No distress.  HEENT: head atraumatic, normocephalic, pupils equal and reactive to light,  neck supple, throat within normal limits Cardiovascular: Normal rate, regular rhythm and normal heart sounds.  No murmur heard. No BLE edema. Pulmonary/Chest: Effort normal and breath sounds normal. No respiratory distress. Abdominal: Soft.  There is no  tenderness. Psychiatric: Patient has a normal mood and affect. behavior is normal. Judgment and thought content normal. Muscular Skeletal: pain during internal and external of both hips. No pain during palpation of lumbar spine  Recent Results (from the past 2160 hour(s))  POCT HgB A1C     Status: Abnormal   Collection Time: 09/02/16 11:33 AM  Result Value Ref Range   Hemoglobin A1C 6.8       PHQ2/9: Depression screen [facility] 2/9 09/02/2016 05/26/2016 02/24/2016 11/25/2015 08/27/2015  Decreased Interest 0 0 0 0 0  Down, Depressed, Hopeless 0 0 0 0 0  PHQ - 2 Score 0 0 0 0 0    Fall Risk: Fall Risk  09/02/2016 05/26/2016 02/24/2016 11/25/2015 08/27/2015  Falls in the past year? No No No No No      Functional Status Survey: Is the patient deaf or have difficulty hearing?: No Does the patient have difficulty seeing, even when wearing glasses/contacts?: No Does the patient have difficulty concentrating, remembering, or making decisions?: No Does the patient have difficulty walking or climbing stairs?: No Does the patient have difficulty dressing or bathing?: No Does the patient have difficulty doing errands alone such as visiting a doctor's office or shopping?: No    Assessment & Plan  1. Controlled type 2 diabetes mellitus with microalbuminuria, without long-term current use of insulin (HCC)  - POCT HgB A1C - Linagliptin-Metformin HCl ER (JENTADUETO XR) 2.12-998 MG TB24; Take 2 tablets by mouth daily.  Dispense: 180 tablet; Refill: 1 - empagliflozin (JARDIANCE) 25 MG TABS tablet; TAKE 1 TABLET BY MOUTH  DAILY.  Dispense: 90 tablet; Refill: 1  2. Hypertension, benign  - amLODipine-olmesartan (AZOR) 5-40 MG tablet; Take 1 tablet by mouth daily.  Dispense: 90 tablet; Refill: 0  3. Dyslipidemia associated with type 2 diabetes mellitus (HCC)  - Lipid panel - omega-3 acid ethyl esters (LOVAZA) 1 g capsule; Take 2 capsules (2 g total) by mouth 2 (two) times daily.  Dispense: 360  capsule; Refill: 1 - atorvastatin (LIPITOR) 20 MG tablet; Take 1 tablet (20 mg total) by mouth daily.  Dispense: 90 tablet; Refill: 1   4. Primary osteoarthritis of both hips  - Comprehensive metabolic panel  5. Controlled gout  - Uric acid - allopurinol (ZYLOPRIM) 300 MG tablet; Take 1 tablet (300 mg total) by mouth 2 (two) times daily.  Dispense: 180 tablet; Refill: 1  6. Perennial allergic rhinitis  - montelukast (SINGULAIR) 10 MG tablet; Take 1 tablet (10 mg total) by mouth daily.  Dispense: 90 tablet; Refill: 1 - levocetirizine (XYZAL) 5 MG tablet; Take 1 tablet (5 mg total) by mouth every evening.  Dispense: 90 tablet; Refill: 1  7. Recurrent vaginitis  - fluconazole (DIFLUCAN) 150 MG tablet; Take 1 tablet (150 mg total) by mouth daily as needed. Max of 3 doses at a time  Dispense: 12 tablet; Refill: 0  8. Herniated intervertebral disc of lumbar spine  - Ambulatory referral to Orthopedic Surgery - seen by Dr. Mack Guise in the past

## 2016-09-08 DIAGNOSIS — M109 Gout, unspecified: Secondary | ICD-10-CM | POA: Diagnosis not present

## 2016-09-08 DIAGNOSIS — E785 Hyperlipidemia, unspecified: Secondary | ICD-10-CM | POA: Diagnosis not present

## 2016-09-08 DIAGNOSIS — M160 Bilateral primary osteoarthritis of hip: Secondary | ICD-10-CM | POA: Diagnosis not present

## 2016-09-09 LAB — URIC ACID: Uric Acid: 2.2 mg/dL — ABNORMAL LOW (ref 2.5–7.1)

## 2016-09-09 LAB — LIPID PANEL
CHOLESTEROL TOTAL: 176 mg/dL (ref 100–199)
Chol/HDL Ratio: 3.9 ratio units (ref 0.0–4.4)
HDL: 45 mg/dL (ref >39)
LDL CALC: 65 mg/dL (ref 0–99)
TRIGLYCERIDES: 331 mg/dL — AB (ref 0–149)
VLDL CHOLESTEROL CAL: 66 mg/dL — AB (ref 5–40)

## 2016-09-09 LAB — COMPREHENSIVE METABOLIC PANEL
A/G RATIO: 1.7 (ref 1.2–2.2)
ALK PHOS: 106 IU/L (ref 39–117)
ALT: 19 IU/L (ref 0–32)
AST: 27 IU/L (ref 0–40)
Albumin: 4.7 g/dL (ref 3.5–5.5)
BILIRUBIN TOTAL: 0.3 mg/dL (ref 0.0–1.2)
BUN/Creatinine Ratio: 33 — ABNORMAL HIGH (ref 9–23)
BUN: 16 mg/dL (ref 6–24)
CHLORIDE: 99 mmol/L (ref 96–106)
CO2: 26 mmol/L (ref 18–29)
Calcium: 9.8 mg/dL (ref 8.7–10.2)
Creatinine, Ser: 0.48 mg/dL — ABNORMAL LOW (ref 0.57–1.00)
GFR calc Af Amer: 124 mL/min/{1.73_m2} (ref >59)
GFR, EST NON AFRICAN AMERICAN: 108 mL/min/{1.73_m2} (ref >59)
GLOBULIN, TOTAL: 2.8 g/dL (ref 1.5–4.5)
Glucose: 108 mg/dL — ABNORMAL HIGH (ref 65–99)
POTASSIUM: 4.1 mmol/L (ref 3.5–5.2)
SODIUM: 140 mmol/L (ref 134–144)
Total Protein: 7.5 g/dL (ref 6.0–8.5)

## 2016-11-11 ENCOUNTER — Other Ambulatory Visit: Payer: Self-pay | Admitting: Family Medicine

## 2016-11-11 DIAGNOSIS — E1169 Type 2 diabetes mellitus with other specified complication: Secondary | ICD-10-CM

## 2016-11-11 DIAGNOSIS — E785 Hyperlipidemia, unspecified: Principal | ICD-10-CM

## 2016-11-11 DIAGNOSIS — J3089 Other allergic rhinitis: Secondary | ICD-10-CM

## 2016-12-01 ENCOUNTER — Ambulatory Visit (INDEPENDENT_AMBULATORY_CARE_PROVIDER_SITE_OTHER): Payer: 59 | Admitting: Family Medicine

## 2016-12-01 ENCOUNTER — Encounter: Payer: Self-pay | Admitting: Family Medicine

## 2016-12-01 VITALS — BP 130/70 | HR 100 | Temp 98.0°F | Resp 16 | Ht 66.0 in | Wt 220.2 lb

## 2016-12-01 DIAGNOSIS — E1129 Type 2 diabetes mellitus with other diabetic kidney complication: Secondary | ICD-10-CM | POA: Diagnosis not present

## 2016-12-01 DIAGNOSIS — R809 Proteinuria, unspecified: Secondary | ICD-10-CM | POA: Diagnosis not present

## 2016-12-01 DIAGNOSIS — I10 Essential (primary) hypertension: Secondary | ICD-10-CM

## 2016-12-01 DIAGNOSIS — M109 Gout, unspecified: Secondary | ICD-10-CM | POA: Diagnosis not present

## 2016-12-01 DIAGNOSIS — E1169 Type 2 diabetes mellitus with other specified complication: Secondary | ICD-10-CM

## 2016-12-01 DIAGNOSIS — E785 Hyperlipidemia, unspecified: Secondary | ICD-10-CM

## 2016-12-01 DIAGNOSIS — J3089 Other allergic rhinitis: Secondary | ICD-10-CM

## 2016-12-01 DIAGNOSIS — M1611 Unilateral primary osteoarthritis, right hip: Secondary | ICD-10-CM | POA: Diagnosis not present

## 2016-12-01 DIAGNOSIS — E669 Obesity, unspecified: Secondary | ICD-10-CM | POA: Diagnosis not present

## 2016-12-01 LAB — POCT GLYCOSYLATED HEMOGLOBIN (HGB A1C): HEMOGLOBIN A1C: 7.1

## 2016-12-01 LAB — POCT UA - MICROALBUMIN: MICROALBUMIN (UR) POC: 100 mg/L

## 2016-12-01 LAB — GLUCOSE, POCT (MANUAL RESULT ENTRY): POC Glucose: 267 mg/dl — AB (ref 70–99)

## 2016-12-01 MED ORDER — ATORVASTATIN CALCIUM 20 MG PO TABS: 20.0000 mg | ORAL_TABLET | Freq: Every day | ORAL | 1 refills | Status: DC

## 2016-12-01 MED ORDER — LEVOCETIRIZINE DIHYDROCHLORIDE 5 MG PO TABS: 5.0000 mg | ORAL_TABLET | Freq: Every evening | ORAL | 1 refills | Status: DC

## 2016-12-01 MED ORDER — FREESTYLE LANCETS MISC: 2 refills | Status: DC

## 2016-12-01 MED ORDER — AMLODIPINE-OLMESARTAN 5-40 MG PO TABS: 1.0000 | ORAL_TABLET | Freq: Every day | ORAL | 0 refills | Status: DC

## 2016-12-01 MED ORDER — FLUTICASONE PROPIONATE 50 MCG/ACT NA SUSP: 2.0000 | Freq: Every day | NASAL | 2 refills | Status: DC

## 2016-12-01 NOTE — Patient Instructions (Addendum)
Please schedule your eye examination as soon as possible. [email or web address] for inexpensive eyeglasses - ask for your prescription from the eye doctor.

## 2016-12-01 NOTE — Progress Notes (Signed)
Name: Maria Cruz   MRN: [id number]    DOB: [birth year]   Date:12/01/2016       Progress Note  Subjective  Chief Complaint  Chief Complaint  Patient presents with  . Medication Refill    3 month F/U and Needs Medication refills on all meds  . Diabetes    Checks twice weekly, Lowest-130 Highest-156  . Hypertension    Denies any symptoms   . Hyperlipidemia  . Gout    Has not had a flair up in awhile   . Obesity    Gained 1 pound since last visit.    HPI  DM II with proteinuria: urine micro is elevated at 100 (was 50 11/25/2015) continue ARB/CCB, taking Jardiance and Jentadueto (has only been taking 1 daily instead of 2), no diarrhea with it. HgbA1C is 7.1% today, was 6.8% last time. CBG267 today - at home running Lowest - 123, Highest - 156, Average - 130. She is denies polyphagia, polydipsia or polyuria. Endorses vision has gotten worse, but is better when wearing glasses - plans to schedule annual eye exam. She stopped walking last week because of the rain, but has plans to restart once the rain stops - she has walks up to 1.5 miles now. She also has dyslipidemia, doing well on Lovaza.  She admits her diet has been poor the last 3 months - eating biscuits for breakfast etc. Agreeable to see nutrition.  Hyperlipidemia: Doing well on Lovaza and atorvastatin - denies muscle aches.  HTN: Taking medication as prescribed, BP is at goal today (130/70). Denies chest pain or palpations , no SOB. She denies side effects of medication.  Gout: no recent episodes, taking allopurinol daily, denies side effects. Last uric acid was slightly low (2.2) on 09/08/2016.  Obesity: weight is stable,  Plans to re-start walking as above. She is agreeable to see nutrition.   OA hips: Pt has not been to see ortho because she had an outstanding bill. She would like to pay bill and then will schedule appointment. Pain is worse on the right side, taking Tylenol daily, walking helps right hip pain, no  longer taking Tramadol, she has an antalgic gait after work. Pain right now 8/10.  Allergic Rhinitis: Has been doing well with Xyzal, requests refill and requests Rx for flonase - has taken in the past and does well with this. Discussed allergen trigger avoidance.   Patient Active Problem List   Diagnosis Date Noted  . Herniated intervertebral disc of lumbar spine 09/02/2016  . Spondylolisthesis of lumbar region 09/02/2016  . Controlled type 2 diabetes mellitus with microalbuminuria, without long-term current use of insulin ([place]) 02/24/2015  . Vitamin D deficiency 02/24/2015  . Perennial allergic rhinitis 02/24/2015  . GERD without esophagitis 02/24/2015  . Hypertension, benign 02/24/2015  . Controlled gout 02/24/2015  . Fatty liver 02/24/2015  . Callus of foot 02/24/2015  . Primary osteoarthritis of right hip 02/24/2015  . History of carpal tunnel syndrome 02/24/2015  . Obesity (BMI 30-39.9) 02/24/2015  . Umbilical hernia without obstruction or gangrene 02/24/2015  . Intermittent low back pain 02/24/2015  . History of colon polyps 02/24/2015  . Dyslipidemia 02/24/2015    Past Surgical History:  Procedure Laterality Date  . ABDOMINAL HYSTERECTOMY  2012  . APPENDECTOMY  2012  . BREAST BIOPSY Right 07/12/2012   Negative  . DILATION AND CURETTAGE OF UTERUS  2007  . TUBAL LIGATION      Family History  Problem Relation Age of Onset  .  Multiple sclerosis Mother   . Heart attack Father   . Kidney failure Sister   . Heart attack Brother   . Diabetes Daughter   . CAD Sister   . Arthritis Sister     RA  . Lupus Sister     Social History   Social History  . Marital status: Single    Spouse name: N/A  . Number of children: N/A  . Years of education: N/A   Occupational History  . Not on file.   Social History Main Topics  . Smoking status: Never Smoker  . Smokeless tobacco: Never Used  . Alcohol use No  . Drug use: No  . Sexual activity: Yes   Other Topics  Concern  . Not on file   Social History Narrative  . No narrative on file     Current Outpatient Prescriptions:  .  acetaminophen (TYLENOL) 500 MG tablet, Take 1 tablet (500 mg total) by mouth every 6 (six) hours as needed., Disp: 90 tablet, Rfl: 0 .  allopurinol (ZYLOPRIM) 300 MG tablet, Take 1 tablet (300 mg total) by mouth 2 (two) times daily., Disp: 180 tablet, Rfl: 1 .  amLODipine-olmesartan (AZOR) 5-40 MG tablet, Take 1 tablet by mouth daily., Disp: 90 tablet, Rfl: 0 .  atorvastatin (LIPITOR) 20 MG tablet, Take 1 tablet (20 mg total) by mouth daily., Disp: 90 tablet, Rfl: 1 .  empagliflozin (JARDIANCE) 25 MG TABS tablet, TAKE 1 TABLET BY MOUTH  DAILY., Disp: 90 tablet, Rfl: 1 .  fluconazole (DIFLUCAN) 150 MG tablet, Take 1 tablet (150 mg total) by mouth daily as needed. Max of 3 doses at a time, Disp: 12 tablet, Rfl: 0 .  Glucose Blood (FREESTYLE LITE TEST VI), , Disp: , Rfl:  .  Lancets (FREESTYLE) lancets, , Disp: , Rfl:  .  levocetirizine (XYZAL) 5 MG tablet, Take 1 tablet (5 mg total) by mouth every evening., Disp: 90 tablet, Rfl: 1 .  Linagliptin-Metformin HCl ER (JENTADUETO XR) 2.12-998 MG TB24, Take 2 tablets by mouth daily., Disp: 180 tablet, Rfl: 1 .  montelukast (SINGULAIR) 10 MG tablet, Take 1 tablet (10 mg total) by mouth daily., Disp: 90 tablet, Rfl: 1 .  omega-3 acid ethyl esters (LOVAZA) 1 g capsule, Take 2 capsules (2 g total) by mouth 2 (two) times daily., Disp: 360 capsule, Rfl: 1  Allergies  Allergen Reactions  . Ace Inhibitors Cough  . Lipofen [Fenofibrate] Other (See Comments)    Localized drug reaction, blister on back  . Penicillins Itching     ROS  Constitutional: Negative for fever or weight change.  Respiratory: Negative for cough and shortness of breath.   Cardiovascular: Negative for chest pain or palpitations.  Gastrointestinal: Negative for abdominal pain, no bowel changes.  Musculoskeletal: Positive for gait problem when right hip pain  exacerbates or joint swelling.  Skin: Negative for rash.  Neurological: Negative for dizziness or headache.  No other specific complaints in a complete review of systems (except as listed in HPI above).  Objective  Vitals:   12/01/16 0958  BP: 130/70  Pulse: 100  Resp: 16  Temp: 98 F (36.7 C)  TempSrc: Oral  SpO2: 97%  Weight: 220 lb 3.2 oz (99.9 kg)  Height: 5\' 6"  (1.676 m)    Body mass index is 35.54 kg/m.  Physical Exam  Constitutional: Patient appears well-developed and well-nourished. Obese. No distress.  HEENT: head atraumatic, normocephalic, pupils equal and reactive to light, no sinus pain/pressure. Cardiovascular: Normal rate, regular  rhythm and normal heart sounds.  No murmur heard. No BLE edema. Pulmonary/Chest: Effort normal and breath sounds normal. No respiratory distress. Abdominal: Soft.  There is no tenderness. Bowel sounds present x4 quad MSK: slightly antalgic gait favoring RLE Psychiatric: Patient has a normal mood and affect. behavior is normal. Judgment and thought content normal.  Recent Results (from the past 2160 hour(s))  POCT HgB A1C     Status: Abnormal   Collection Time: 09/02/16 11:33 AM  Result Value Ref Range   Hemoglobin A1C 6.8   Uric acid     Status: Abnormal   Collection Time: 09/08/16  8:53 AM  Result Value Ref Range   Uric Acid 2.2 (L) 2.5 - 7.1 mg/dL    Comment:            Therapeutic target for gout patients: <6.0  Lipid panel     Status: Abnormal   Collection Time: 09/08/16  8:53 AM  Result Value Ref Range   Cholesterol, Total 176 100 - 199 mg/dL   Triglycerides 331 (H) 0 - 149 mg/dL   HDL 45 >39 mg/dL   VLDL Cholesterol Cal 66 (H) 5 - 40 mg/dL   LDL Calculated 65 0 - 99 mg/dL   Chol/HDL Ratio 3.9 0.0 - 4.4 ratio units    Comment:                                   T. Chol/HDL Ratio                                             Men  Women                               1/2 Avg.Risk  3.4    3.3                                    Avg.Risk  5.0    4.4                                2X Avg.Risk  9.6    7.1                                3X Avg.Risk 23.4   11.0   Comprehensive metabolic panel     Status: Abnormal   Collection Time: 09/08/16  8:53 AM  Result Value Ref Range   Glucose 108 (H) 65 - 99 mg/dL   BUN 16 6 - 24 mg/dL   Creatinine, Ser 0.48 (L) 0.57 - 1.00 mg/dL   GFR calc non Af Amer 108 >59 mL/min/1.73   GFR calc Af Amer 124 >59 mL/min/1.73   BUN/Creatinine Ratio 33 (H) 9 - 23   Sodium 140 134 - 144 mmol/L   Potassium 4.1 3.5 - 5.2 mmol/L   Chloride 99 96 - 106 mmol/L   CO2 26 18 - 29 mmol/L   Calcium 9.8 8.7 - 10.2 mg/dL   Total Protein 7.5 6.0 - 8.5 g/dL   Albumin 4.7 3.5 - 5.5  g/dL   Globulin, Total 2.8 1.5 - 4.5 g/dL   Albumin/Globulin Ratio 1.7 1.2 - 2.2   Bilirubin Total 0.3 0.0 - 1.2 mg/dL   Alkaline Phosphatase 106 39 - 117 IU/L   AST 27 0 - 40 IU/L   ALT 19 0 - 32 IU/L  POCT Glucose (CBG)     Status: Abnormal   Collection Time: 12/01/16 10:00 AM  Result Value Ref Range   POC Glucose 267 (A) 70 - 99 mg/dl    PHQ2/9: Depression screen [facility] 2/9 12/01/2016 09/02/2016 05/26/2016 02/24/2016 11/25/2015  Decreased Interest 0 0 0 0 0  Down, Depressed, Hopeless 0 0 0 0 0  PHQ - 2 Score 0 0 0 0 0    Fall Risk: Fall Risk  12/01/2016 09/02/2016 05/26/2016 02/24/2016 11/25/2015  Falls in the past year? No No No No No   Functional Status Survey: Is the patient deaf or have difficulty hearing?: No Does the patient have difficulty concentrating, remembering, or making decisions?: No Does the patient have difficulty walking or climbing stairs?: No Does the patient have difficulty dressing or bathing?: No Does the patient have difficulty doing errands alone such as visiting a doctor's office or shopping?: No    Assessment & Plan  1. Controlled type 2 diabetes mellitus with microalbuminuria, without long-term current use of insulin (HCC) - POCT HgB A1C - POCT UA - Microalbumin - POCT Glucose  (CBG) - Amb Referral to Nutrition and Diabetic E - Lancets (FREESTYLE) lancets; Check blood sugars as needed.  Dispense: 100 each; Refill: 2 -Will begin taking Jentadueto as prescribed (2 Tablets daily) -Discussed healthy diet, reducing carbohydrate intake, and increasing activity at length. -Will schedule eye appt.  2. Hypertension, benign  - amLODipine-olmesartan (AZOR) 5-40 MG tablet; Take 1 tablet by mouth daily.  Dispense: 90 tablet; Refill: 0  3. Dyslipidemia  - atorvastatin (LIPITOR) 20 MG tablet; Take 1 tablet (20 mg total) by mouth daily.  Dispense: 90 tablet; Refill: 1  4. Obesity (BMI 30-39.9)  - Amb Referral to Nutrition and Diabetic E - Increase activity   5. Primary osteoarthritis of right hip -Will schedule appointment when able with Ortho  6. Perennial allergic rhinitis  - levocetirizine (XYZAL) 5 MG tablet; Take 1 tablet (5 mg total) by mouth every evening.  Dispense: 90 tablet; Refill: 1 - fluticasone (FLONASE) 50 MCG/ACT nasal spray; Place 2 sprays into both nostrils daily.  Dispense: 16 g; Refill: 2  7. Controlled gout -Continue Allopurinol  8. Dyslipidemia associated with type 2 diabetes mellitus (HCC)  - atorvastatin (LIPITOR) 20 MG tablet; Take 1 tablet (20 mg total) by mouth daily.  Dispense: 90 tablet; Refill: 1   Follow up in 3 months.

## 2016-12-30 ENCOUNTER — Other Ambulatory Visit: Payer: Self-pay | Admitting: Family Medicine

## 2016-12-30 DIAGNOSIS — I10 Essential (primary) hypertension: Secondary | ICD-10-CM

## 2016-12-30 DIAGNOSIS — E785 Hyperlipidemia, unspecified: Secondary | ICD-10-CM

## 2016-12-30 DIAGNOSIS — E1169 Type 2 diabetes mellitus with other specified complication: Secondary | ICD-10-CM

## 2016-12-30 DIAGNOSIS — J3089 Other allergic rhinitis: Secondary | ICD-10-CM

## 2017-01-24 ENCOUNTER — Other Ambulatory Visit: Payer: Self-pay | Admitting: Family Medicine

## 2017-01-24 DIAGNOSIS — E1129 Type 2 diabetes mellitus with other diabetic kidney complication: Secondary | ICD-10-CM

## 2017-01-24 DIAGNOSIS — R809 Proteinuria, unspecified: Principal | ICD-10-CM

## 2017-02-02 ENCOUNTER — Other Ambulatory Visit: Payer: Self-pay | Admitting: Family Medicine

## 2017-02-02 DIAGNOSIS — J3089 Other allergic rhinitis: Secondary | ICD-10-CM

## 2017-02-02 DIAGNOSIS — I10 Essential (primary) hypertension: Secondary | ICD-10-CM

## 2017-02-02 DIAGNOSIS — N760 Acute vaginitis: Secondary | ICD-10-CM

## 2017-02-02 DIAGNOSIS — E785 Hyperlipidemia, unspecified: Secondary | ICD-10-CM

## 2017-02-02 DIAGNOSIS — R809 Proteinuria, unspecified: Secondary | ICD-10-CM

## 2017-02-02 DIAGNOSIS — E1129 Type 2 diabetes mellitus with other diabetic kidney complication: Secondary | ICD-10-CM

## 2017-02-02 DIAGNOSIS — E1169 Type 2 diabetes mellitus with other specified complication: Secondary | ICD-10-CM

## 2017-02-02 DIAGNOSIS — M109 Gout, unspecified: Secondary | ICD-10-CM

## 2017-02-02 NOTE — Telephone Encounter (Signed)
Patient requesting refill of Allopurinol, Jardiance, Fluconazole, Singulair and Jentadueto to [facility].

## 2017-02-04 NOTE — Telephone Encounter (Signed)
Dr. Ancil Boozer provided 100 Lancets with 2 refills in April. This should be enough to get her through to her follow up in July. Please clarify how patient is using lancets and let me know if she needs a small refill to get her through to her appointment. Thanks!

## 2017-03-02 ENCOUNTER — Encounter: Payer: Self-pay | Admitting: Family Medicine

## 2017-03-02 ENCOUNTER — Ambulatory Visit (INDEPENDENT_AMBULATORY_CARE_PROVIDER_SITE_OTHER): Payer: 59 | Admitting: Family Medicine

## 2017-03-02 VITALS — BP 132/86 | HR 104 | Temp 98.0°F | Resp 16 | Wt 223.3 lb

## 2017-03-02 DIAGNOSIS — Z1231 Encounter for screening mammogram for malignant neoplasm of breast: Secondary | ICD-10-CM

## 2017-03-02 DIAGNOSIS — N760 Acute vaginitis: Secondary | ICD-10-CM

## 2017-03-02 DIAGNOSIS — R809 Proteinuria, unspecified: Secondary | ICD-10-CM | POA: Diagnosis not present

## 2017-03-02 DIAGNOSIS — E559 Vitamin D deficiency, unspecified: Secondary | ICD-10-CM

## 2017-03-02 DIAGNOSIS — E1129 Type 2 diabetes mellitus with other diabetic kidney complication: Secondary | ICD-10-CM

## 2017-03-02 DIAGNOSIS — I10 Essential (primary) hypertension: Secondary | ICD-10-CM | POA: Diagnosis not present

## 2017-03-02 DIAGNOSIS — M1611 Unilateral primary osteoarthritis, right hip: Secondary | ICD-10-CM | POA: Diagnosis not present

## 2017-03-02 DIAGNOSIS — M109 Gout, unspecified: Secondary | ICD-10-CM | POA: Diagnosis not present

## 2017-03-02 DIAGNOSIS — E785 Hyperlipidemia, unspecified: Secondary | ICD-10-CM

## 2017-03-02 DIAGNOSIS — IMO0002 Reserved for concepts with insufficient information to code with codable children: Secondary | ICD-10-CM

## 2017-03-02 DIAGNOSIS — Z1239 Encounter for other screening for malignant neoplasm of breast: Secondary | ICD-10-CM

## 2017-03-02 DIAGNOSIS — E669 Obesity, unspecified: Secondary | ICD-10-CM

## 2017-03-02 DIAGNOSIS — K219 Gastro-esophageal reflux disease without esophagitis: Secondary | ICD-10-CM

## 2017-03-02 DIAGNOSIS — E1169 Type 2 diabetes mellitus with other specified complication: Secondary | ICD-10-CM

## 2017-03-02 DIAGNOSIS — E1165 Type 2 diabetes mellitus with hyperglycemia: Secondary | ICD-10-CM | POA: Diagnosis not present

## 2017-03-02 DIAGNOSIS — J3089 Other allergic rhinitis: Secondary | ICD-10-CM | POA: Diagnosis not present

## 2017-03-02 DIAGNOSIS — R928 Other abnormal and inconclusive findings on diagnostic imaging of breast: Secondary | ICD-10-CM

## 2017-03-02 LAB — POCT GLYCOSYLATED HEMOGLOBIN (HGB A1C): Hemoglobin A1C: 7.3

## 2017-03-02 MED ORDER — FLUTICASONE PROPIONATE 50 MCG/ACT NA SUSP: 2.0000 | Freq: Every day | NASAL | 1 refills | Status: DC

## 2017-03-02 MED ORDER — SEMAGLUTIDE(0.25 OR 0.5MG/DOS) 2 MG/1.5ML ~~LOC~~ SOPN: 0.2500 mg | PEN_INJECTOR | Freq: Every day | SUBCUTANEOUS | 0 refills | Status: DC

## 2017-03-02 MED ORDER — MONTELUKAST SODIUM 10 MG PO TABS: 10.0000 mg | ORAL_TABLET | Freq: Every day | ORAL | 1 refills | Status: DC

## 2017-03-02 MED ORDER — AMLODIPINE-OLMESARTAN 5-40 MG PO TABS: 1.0000 | ORAL_TABLET | Freq: Every day | ORAL | 1 refills | Status: DC

## 2017-03-02 MED ORDER — OMEGA-3-ACID ETHYL ESTERS 1 G PO CAPS: 2.0000 g | ORAL_CAPSULE | Freq: Two times a day (BID) | ORAL | 1 refills | Status: DC

## 2017-03-02 MED ORDER — FLUCONAZOLE 150 MG PO TABS: 150.0000 mg | ORAL_TABLET | Freq: Every day | ORAL | 0 refills | Status: DC | PRN

## 2017-03-02 MED ORDER — ALLOPURINOL 300 MG PO TABS: 300.0000 mg | ORAL_TABLET | Freq: Two times a day (BID) | ORAL | 1 refills | Status: DC

## 2017-03-02 MED ORDER — EMPAGLIFLOZIN-METFORMIN HCL ER 25-1000 MG PO TB24: 1.0000 | ORAL_TABLET | Freq: Every day | ORAL | 0 refills | Status: DC

## 2017-03-02 NOTE — Progress Notes (Signed)
Name: Maria Cruz   MRN: [id number]    DOB: 12/10/1955   Date:03/02/2017       Progress Note  Subjective  Chief Complaint  Chief Complaint  Patient presents with  . Medication Refill    3 month F/U  . Diabetes  . Hypertension  . Gout  . Obesity    HPI  DM II with proteinuria: urine micro is elevatedat 100 (was 50 11/25/2015) continue ARB/CCB, taking Jardiance and Jentadueto,she went up to two on her last visit, but hgbA1C is still going up.  HgbA1C is 7.3% today , it was 7.1% and 6.8% last time. At  home running  130 fasting average, 150's post-prandially  She is denies polyphagia, polydipsia but has some polyuria. Endorses vision has gotten worse, but is better when wearing glasses - plans to schedule annual eye exam.She is trying to eat more fruit and vegetables.   Hyperlipidemia: Doing well on Lovaza and atorvastatin - denies muscle aches.  HTN: Taking medication as prescribed, BP is at goal. Denies chest pain or palpations , no SOB. She denies side effects of medication.  Gout: no recent episodes, taking allopurinol daily, denies side effects. Last uric acid was slightly low (2.2) on 09/08/2016.  Obesity: weight is stable, Plans to re-start walking as above. Discussed GLP-1 agonist. She did not go to nutritionist  because of cost  OA hips: Pt has not been to see ortho because she had an outstanding bill. She would like to pay bill and then will schedule appointment. Pain is worse on the left  side, taking Tylenol 500 mg once a day, advised to go up to three times a day, walking helps right hip pain, no longer taking Tramadol, she has an antalgic gait after work. Pain right now 8/10.  Allergic Rhinitis: Has been doing well with Xyzal, requests refill and requests Rx for flonase - has taken in the past and does well with this. Discussed allergen trigger avoidance.   Patient Active Problem List   Diagnosis Date Noted  . Herniated intervertebral disc of lumbar spine  09/02/2016  . Spondylolisthesis of lumbar region 09/02/2016  . Controlled type 2 diabetes mellitus with microalbuminuria, without long-term current use of insulin ([place]) 02/24/2015  . Vitamin D deficiency 02/24/2015  . Perennial allergic rhinitis 02/24/2015  . GERD without esophagitis 02/24/2015  . Hypertension, benign 02/24/2015  . Controlled gout 02/24/2015  . Fatty liver 02/24/2015  . Callus of foot 02/24/2015  . Primary osteoarthritis of right hip 02/24/2015  . History of carpal tunnel syndrome 02/24/2015  . Obesity (BMI 30-39.9) 02/24/2015  . Umbilical hernia without obstruction or gangrene 02/24/2015  . Intermittent low back pain 02/24/2015  . History of colon polyps 02/24/2015  . Dyslipidemia 02/24/2015    Past Surgical History:  Procedure Laterality Date  . ABDOMINAL HYSTERECTOMY  2012  . APPENDECTOMY  2012  . BREAST BIOPSY Right 07/12/2012   Negative  . DILATION AND CURETTAGE OF UTERUS  2007  . TUBAL LIGATION      Family History  Problem Relation Age of Onset  . Multiple sclerosis Mother   . Heart attack Father   . Kidney failure Sister   . Heart attack Brother   . Diabetes Daughter   . CAD Sister   . Arthritis Sister        RA  . Lupus Sister     Social History   Social History  . Marital status: Single    Spouse name: N/A  . Number  of children: N/A  . Years of education: N/A   Occupational History  . Not on file.   Social History Main Topics  . Smoking status: Never Smoker  . Smokeless tobacco: Never Used  . Alcohol use No  . Drug use: No  . Sexual activity: Yes   Other Topics Concern  . Not on file   Social History Narrative  . No narrative on file     Current Outpatient Prescriptions:  .  acetaminophen (TYLENOL) 500 MG tablet, Take 1 tablet (500 mg total) by mouth every 6 (six) hours as needed., Disp: 90 tablet, Rfl: 0 .  allopurinol (ZYLOPRIM) 300 MG tablet, Take 1 tablet (300 mg total) by mouth 2 (two) times daily., Disp: 180 tablet,  Rfl: 1 .  amLODipine-olmesartan (AZOR) 5-40 MG tablet, Take 1 tablet by mouth daily., Disp: 90 tablet, Rfl: 1 .  atorvastatin (LIPITOR) 20 MG tablet, Take 1 tablet (20 mg total) by mouth daily., Disp: 90 tablet, Rfl: 1 .  Empagliflozin-Metformin HCl ER (SYNJARDY XR) 25-1000 MG TB24, Take 1 tablet by mouth daily., Disp: 30 tablet, Rfl: 0 .  fluconazole (DIFLUCAN) 150 MG tablet, Take 1 tablet (150 mg total) by mouth daily as needed. Max of 3 doses at a time, Disp: 12 tablet, Rfl: 0 .  fluticasone (FLONASE) 50 MCG/ACT nasal spray, Place 2 sprays into both nostrils daily., Disp: 48 g, Rfl: 1 .  Glucose Blood (FREESTYLE LITE TEST VI), , Disp: , Rfl:  .  Lancets (FREESTYLE) lancets, Check blood sugars as needed., Disp: 100 each, Rfl: 2 .  levocetirizine (XYZAL) 5 MG tablet, Take 1 tablet (5 mg total) by mouth every evening., Disp: 90 tablet, Rfl: 1 .  montelukast (SINGULAIR) 10 MG tablet, Take 1 tablet (10 mg total) by mouth daily., Disp: 90 tablet, Rfl: 1 .  omega-3 acid ethyl esters (LOVAZA) 1 g capsule, Take 2 capsules (2 g total) by mouth 2 (two) times daily., Disp: 360 capsule, Rfl: 1 .  Semaglutide (OZEMPIC) 0.25 or 0.5 MG/DOSE SOPN, Inject 0.25-0.5 mg into the skin daily., Disp: 2 pen, Rfl: 0  Allergies  Allergen Reactions  . Ace Inhibitors Cough  . Lipofen [Fenofibrate] Other (See Comments)    Localized drug reaction, blister on back  . Penicillins Itching     ROS  Constitutional: Negative for fever or weight change.  Respiratory: Negative for cough and shortness of breath.   Cardiovascular: Negative for chest pain or palpitations.  Gastrointestinal: Negative for abdominal pain, no bowel changes.  Musculoskeletal: Positive  for gait problem no  joint swelling.  Skin: Negative for rash.  Neurological: Negative for dizziness or headache.  No other specific complaints in a complete review of systems (except as listed in HPI above).   Objective  Vitals:   03/02/17 1029  BP: 132/86   Pulse: (!) 104  Resp: 16  Temp: 98 F (36.7 C)  TempSrc: Oral  SpO2: 96%  Weight: 223 lb 5 oz (101.3 kg)    Body mass index is 36.04 kg/m.  Physical Exam  Constitutional: Patient appears well-developed and well-nourished. Obese No distress.  HEENT: head atraumatic, normocephalic, pupils equal and reactive to light,  neck supple, throat within normal limits Cardiovascular: Normal rate, regular rhythm and normal heart sounds.  No murmur heard. No BLE edema. Pulmonary/Chest: Effort normal and breath sounds normal. No respiratory distress. Abdominal: Soft.  There is no tenderness. Psychiatric: Patient has a normal mood and affect. behavior is normal. Judgment and thought content normal. Muscular  Skeletal: decrease rom of both hips, pain with rom of left hip , antalgic gait  Diabetic Foot Exam: Diabetic Foot Exam - Simple   Simple Foot Form Diabetic Foot exam was performed with the following findings:  Yes 03/02/2017 10:35 AM  Visual Inspection See comments:  Yes Sensation Testing Intact to touch and monofilament testing bilaterally:  Yes Pulse Check Posterior Tibialis and Dorsalis pulse intact bilaterally:  Yes Comments Mild corn formation on top of bid toe      PHQ2/9: Depression screen [facility] 2/9 12/01/2016 09/02/2016 05/26/2016 02/24/2016 11/25/2015  Decreased Interest 0 0 0 0 0  Down, Depressed, Hopeless 0 0 0 0 0  PHQ - 2 Score 0 0 0 0 0    Fall Risk: Fall Risk  12/01/2016 09/02/2016 05/26/2016 02/24/2016 11/25/2015  Falls in the past year? No No No No No     Assessment & Plan  1. Uncontrolled type 2 diabetes mellitus with microalbuminuria, without long-term current use of insulin ([place])  Discussed possible side effects of new medication, she denies family history of MEN or thyroid cancer, no personal history of pancreatitis - POCT glycosylated hemoglobin (Hb A1C) - Semaglutide (OZEMPIC) 0.25 or 0.5 MG/DOSE SOPN; Inject 0.25-0.5 mg into the skin daily.  Dispense: 2 pen;  Refill: 0  2. Breast cancer screening  - MM Digital Screening; Future  3. Hypertension, benign  - amLODipine-olmesartan (AZOR) 5-40 MG tablet; Take 1 tablet by mouth daily.  Dispense: 90 tablet; Refill: 1  4. Dyslipidemia  - omega-3 acid ethyl esters (LOVAZA) 1 g capsule; Take 2 capsules (2 g total) by mouth 2 (two) times daily.  Dispense: 360 capsule; Refill: 1  5. Obesity (BMI 30-39.9)  Discussed with the patient the risk posed by an increased BMI. Discussed importance of portion control, calorie counting and at least 150 minutes of physical activity weekly. Avoid sweet beverages and drink more water. Eat at least 6 servings of fruit and vegetables daily   6. Primary osteoarthritis of right hip  Waiting to see Ortho, continue Tylenol for now, she does not want pain medication, still sleeping well at night  7. Dyslipidemia associated with type 2 diabetes mellitus (HCC)  - omega-3 acid ethyl esters (LOVAZA) 1 g capsule; Take 2 capsules (2 g total) by mouth 2 (two) times daily.  Dispense: 360 capsule; Refill: 1  8. Vitamin D deficiency  Continue otc supplementation   9. GERD without esophagitis  stable  10. Perennial allergic rhinitis  - montelukast (SINGULAIR) 10 MG tablet; Take 1 tablet (10 mg total) by mouth daily.  Dispense: 90 tablet; Refill: 1 - fluticasone (FLONASE) 50 MCG/ACT nasal spray; Place 2 sprays into both nostrils daily.  Dispense: 48 g; Refill: 1  11. Controlled gout  - allopurinol (ZYLOPRIM) 300 MG tablet; Take 1 tablet (300 mg total) by mouth 2 (two) times daily.  Dispense: 180 tablet; Refill: 1  12. Recurrent vaginitis  - fluconazole (DIFLUCAN) 150 MG tablet; Take 1 tablet (150 mg total) by mouth daily as needed. Max of 3 doses at a time  Dispense: 12 tablet; Refill: 0

## 2017-03-23 ENCOUNTER — Other Ambulatory Visit: Payer: Self-pay | Admitting: Family Medicine

## 2017-03-23 DIAGNOSIS — E1129 Type 2 diabetes mellitus with other diabetic kidney complication: Secondary | ICD-10-CM

## 2017-03-23 DIAGNOSIS — R809 Proteinuria, unspecified: Principal | ICD-10-CM

## 2017-03-23 NOTE — Telephone Encounter (Signed)
Patient requesting refill of Jentadueto and Jardiance to [facility].

## 2017-04-28 ENCOUNTER — Ambulatory Visit
Admission: RE | Admit: 2017-04-28 | Discharge: 2017-04-28 | Disposition: A | Discharge status: Home / Self Care | Payer: 59 | Source: Ambulatory Visit | Attending: Family Medicine | Admitting: Family Medicine

## 2017-04-28 DIAGNOSIS — R928 Other abnormal and inconclusive findings on diagnostic imaging of breast: Secondary | ICD-10-CM | POA: Insufficient documentation

## 2017-04-28 DIAGNOSIS — R921 Mammographic calcification found on diagnostic imaging of breast: Secondary | ICD-10-CM | POA: Diagnosis not present

## 2017-04-28 DIAGNOSIS — Z1239 Encounter for other screening for malignant neoplasm of breast: Secondary | ICD-10-CM

## 2017-04-29 ENCOUNTER — Other Ambulatory Visit: Payer: Self-pay | Admitting: Family Medicine

## 2017-04-29 DIAGNOSIS — R928 Other abnormal and inconclusive findings on diagnostic imaging of breast: Secondary | ICD-10-CM

## 2017-05-02 ENCOUNTER — Encounter: Payer: Self-pay | Admitting: *Deleted

## 2017-05-03 ENCOUNTER — Other Ambulatory Visit: Payer: Self-pay | Admitting: General Surgery

## 2017-05-03 ENCOUNTER — Encounter: Payer: Self-pay | Admitting: General Surgery

## 2017-05-03 ENCOUNTER — Ambulatory Visit (INDEPENDENT_AMBULATORY_CARE_PROVIDER_SITE_OTHER): Payer: 59 | Admitting: General Surgery

## 2017-05-03 ENCOUNTER — Telehealth: Payer: Self-pay

## 2017-05-03 VITALS — BP 140/80 | HR 100 | Resp 16 | Ht 67.0 in | Wt 215.0 lb

## 2017-05-03 DIAGNOSIS — R920 Mammographic microcalcification found on diagnostic imaging of breast: Secondary | ICD-10-CM | POA: Insufficient documentation

## 2017-05-03 DIAGNOSIS — K429 Umbilical hernia without obstruction or gangrene: Secondary | ICD-10-CM | POA: Diagnosis not present

## 2017-05-03 DIAGNOSIS — R921 Mammographic calcification found on diagnostic imaging of breast: Secondary | ICD-10-CM

## 2017-05-03 DIAGNOSIS — R928 Other abnormal and inconclusive findings on diagnostic imaging of breast: Secondary | ICD-10-CM

## 2017-05-03 NOTE — Patient Instructions (Signed)
Schedule Stereotactic Breast Biopsy A breast biopsy is a procedure in which a sample of suspicious breast tissue is removed from your breast. In a stereotactic breast biopsy, an X-ray of the breast (mammogram) is used during the procedure to locate the area of the breast where the tissue sample will be taken. After the procedure, the tissue that is removed from the breast is examined under a microscope to see if cancerous cells are present. You may need a stereotactic breast biopsy if you have:  Any undiagnosed breast mass (tumor).  Calcium deposits (calcifications) or abnormalities seen on a mammogram, ultrasound results, or MRI results.  Nipple abnormalities, dimpling, crusting, or ulcerations.  Abnormal discharge from the nipple, especially blood.  Redness, swelling, and pain of the breast.  Suspicious changes in the breast seen on your mammogram.  If the breast abnormality is found to be cancerous (malignant), a stereotactic breast biopsy can help to determine what the best treatment is for you. Tell a health care provider about:  Any allergies you have.  All medicines you are taking, including vitamins, herbs, eye drops, creams, and over-the-counter medicines.  Any problems you or family members have had with anesthetic medicines.  Any blood disorders you have.  Any surgeries you have had.  Any medical conditions you have.  Whether you are pregnant or may be pregnant. What are the risks? Generally, this is a safe procedure. However, problems may occur, including:  Infection at the needle-insertion site.  Bleeding.  Soreness.  Allergic reactions to medicines.  Bruising and swelling of the breast.  Change in the shape of the breast.  Damage to other tissues.  What happens before the procedure?  Ask your health care provider about: ? Changing or stopping your regular medicines. This is especially important if you are taking diabetes medicines or blood  thinners. ? Taking medicines such as aspirin and ibuprofen. These medicines can thin your blood. Do not take these medicines before your procedure if your health care provider instructs you not to.  Wear a good support bra to the procedure.  Do not put on antiperspirant or deodorant the day of the procedure, because it may cause white spots on the X-ray images.  You may be screened for extra fluid around the lymph nodes (lymphedema).  You will be asked to remove jewelry, dentures, eyeglasses, metal objects, or clothing that might interfere with the X-ray images. What happens during the procedure?  You will lie face-down on a table. Your breast will pass through an opening in the table.  Your breast will be gently compressed into an unchanging (fixed) position between a breast platform and a compression plate. Try to stay as relaxed as possible during the procedure. You will need to stay in one position for the length of the procedure.  X-rays will be used to locate the breast lump.  Your skin will be washed with soap, and you will be given a medicine to numb the area (local anesthetic).  A small incision will be made in your breast.  The tip of the biopsy needle will be directed through the incision. Several small pieces of suspicious tissue will be collected.  Then, a final set of X-ray images will be taken. If they show that the suspicious tissue has been mostly or completely removed, a small clip will be left at the biopsy site. This will allow the biopsy site to be easily located if the results of the biopsy show that the tissue is cancerous.  The  incision will be stitched (sutured) or taped and covered with a bandage (dressing). Your health care provider may apply a bandage that is wrapped tightly around your chest (pressure dressing) and an ice pack to prevent bleeding and swelling in the breast. The procedure may vary among health care providers and hospitals. What happens after  the procedure?  If you are doing well and have no problems, you will be allowed to go home.  It is up to you to get the results of your procedure. Ask your health care provider, or the department that is doing the procedure, when your results will be ready. This information is not intended to replace advice given to you by your health care provider. Make sure you discuss any questions you have with your health care provider. Document Released: 04/24/2003 Document Revised: 04/08/2016 Document Reviewed: 04/08/2016 Elsevier Interactive Patient Education  2018 [place] breast biopsy

## 2017-05-03 NOTE — Progress Notes (Signed)
Patient ID: MARTRICE APT, female   DOB: Apr 14, 1956, 61 y.o.   MRN: [id number]  Chief Complaint  Patient presents with  . Breast Problem    HPI Maria Cruz is a 61 y.o. female. who presents for a breast evaluation. The most recent mammogram was done on 04-28-17.  Patient does perform regular self breast checks and tries to get regular mammograms done, prior mammogram 2016.  She denies any breast injury or trauma. She can not feel anything different in the breast. She states she has an umbilical hernia that is gradually getting larger. She is here with her daughter, Callie Fielding.  She works as a Gaffer at [employer].   HPI  Past Medical History:  Diagnosis Date  . Allergy   . Back pain with radiation   . Calluse    Foot  . Diabetes mellitus without complication ([place])   . Fatty liver   . GERD (gastroesophageal reflux disease)   . Hyperlipidemia   . Hypertension   . Lumbar radiculopathy    Dr. Mack Guise  . Obesity   . Plantar fasciitis, left   . Vitamin D deficiency     Past Surgical History:  Procedure Laterality Date  . ABDOMINAL HYSTERECTOMY  2012  . APPENDECTOMY  2012  . BREAST BIOPSY Right 07/12/2012   Negative  . COLONOSCOPY  2009  . DILATION AND CURETTAGE OF UTERUS  2007  . TUBAL LIGATION      Family History  Problem Relation Age of Onset  . Multiple sclerosis Mother   . Heart attack Father   . Kidney failure Sister   . Heart attack Brother   . Diabetes Daughter   . CAD Sister   . Arthritis Sister        RA  . Lupus Sister   . Breast cancer Neg Hx     Social History Social History  Substance Use Topics  . Smoking status: Never Smoker  . Smokeless tobacco: Never Used  . Alcohol use No    Allergies  Allergen Reactions  . Ace Inhibitors Cough  . Lipofen [Fenofibrate] Other (See Comments)    Localized drug reaction, blister on back  . Penicillins Itching  . Sulfa Antibiotics Itching    Current Outpatient Prescriptions  Medication  Sig Dispense Refill  . acetaminophen (TYLENOL) 500 MG tablet Take 1 tablet (500 mg total) by mouth every 6 (six) hours as needed. 90 tablet 0  . allopurinol (ZYLOPRIM) 300 MG tablet Take 1 tablet (300 mg total) by mouth 2 (two) times daily. 180 tablet 1  . amLODipine-olmesartan (AZOR) 5-40 MG tablet Take 1 tablet by mouth daily. 90 tablet 1  . aspirin 81 MG chewable tablet Chew 81 mg by mouth daily.    Marland Kitchen atorvastatin (LIPITOR) 20 MG tablet Take 1 tablet (20 mg total) by mouth daily. 90 tablet 1  . Coenzyme Q10 (CO Q 10 PO) Take by mouth daily.    . fluticasone (FLONASE) 50 MCG/ACT nasal spray Place 2 sprays into both nostrils daily. 48 g 1  . Glucose Blood (FREESTYLE LITE TEST VI)     . Lancets (FREESTYLE) lancets Check blood sugars as needed. 100 each 2  . levocetirizine (XYZAL) 5 MG tablet Take 1 tablet (5 mg total) by mouth every evening. 90 tablet 1  . Linagliptin-Metformin HCl (JENTADUETO) 2.12-998 MG TABS Take 2 tablets by mouth daily.    . montelukast (SINGULAIR) 10 MG tablet Take 1 tablet (10 mg total) by mouth daily. 90 tablet  1  . omega-3 acid ethyl esters (LOVAZA) 1 g capsule Take 2 capsules (2 g total) by mouth 2 (two) times daily. 360 capsule 1  . traMADol (ULTRAM) 50 MG tablet Take 50 mg by mouth 2 (two) times daily.    . Vitamin D, Ergocalciferol, 2000 units CAPS Take by mouth daily.    . Semaglutide (OZEMPIC) 0.25 or 0.5 MG/DOSE SOPN Inject 0.25-0.5 mg into the skin daily. 2 pen 0   No current facility-administered medications for this visit.     Review of Systems Review of Systems  Constitutional: Negative.   Respiratory: Negative.   Cardiovascular: Negative.     Blood pressure 140/80, pulse 100, resp. rate 16, height 5\' 7"  (1.702 m), weight 215 lb (97.5 kg).  Physical Exam Physical Exam  Constitutional: She is oriented to person, place, and time. She appears well-developed and well-nourished.  HENT:  Mouth/Throat: Oropharynx is clear and moist.  Eyes: Conjunctivae  are normal. No scleral icterus.  Neck: Neck supple.  Cardiovascular: Normal rate, regular rhythm and normal heart sounds.   Pulmonary/Chest: Effort normal and breath sounds normal.     Right breast exhibits no inverted nipple, no mass, no nipple discharge, no skin change and no tenderness. Left breast exhibits no inverted nipple, no mass, no nipple discharge, no skin change and no tenderness.    Well healed scar 12 o'clock right breast  Abdominal: Normal appearance. A hernia is present.    2-3 cm umbilical hernia defect  Lymphadenopathy:    She has no cervical adenopathy.    She has no axillary adenopathy.  Neurological: She is alert and oriented to person, place, and time.  Skin: Skin is warm and dry.  lower left back at T12 3 x 6 cm area of blistering.  Psychiatric: Her behavior is normal.    Data Reviewed Dec 10, 2014 and 04/28/2017 diagnostic mammograms reviewed. New microcalcifications in the left breast for which biopsy was recommended.    Assessment    Abnormal mammogram.    Plan    Options for management of the microcalcifications reviewed: 1 (proceed with stereotactic biopsy by radiology versus 2) 6 month observation. Considering her other health issues and her anxiety about the possibility of malignancy she is elected to proceed early biopsy.  The umbilical hernia has a small tablet of incarcerated fat. She reports this is been enlarging over the last year. She may be a good candidate for repair before incarceration.  The patch on the left back is been coming and going for some time area didn't very little pain. Clinically it looks like shingles although the lack of pain would speak against this. She was encouraged to take a photo of the area to show her primary care physician    Discussed options observation vs biopsy She wishes to proceed with stereotatic left breast biopsy    HPI, Physical Exam, Assessment and Plan have been scribed under the direction and  in the presence of Robert Bellow, MD. Karie Fetch, RN  I have completed the exam and reviewed the above documentation for accuracy and completeness.  I agree with the above.  Haematologist has been used and any errors in dictation or transcription are unintentional.  Hervey Ard, M.D., F.A.C.S. Robert Bellow 05/03/2017, 8:44 AM

## 2017-05-03 NOTE — Telephone Encounter (Signed)
Spoke with [facility] and she is aware that Dr Bary Castilla wants their Radiologist to do the Left Stereo Biopsy for the patient. She states that they will take care of this and contact the patient to schedule this also.

## 2017-05-04 ENCOUNTER — Other Ambulatory Visit: Payer: Self-pay | Admitting: General Surgery

## 2017-05-04 DIAGNOSIS — R921 Mammographic calcification found on diagnostic imaging of breast: Secondary | ICD-10-CM

## 2017-05-04 DIAGNOSIS — R928 Other abnormal and inconclusive findings on diagnostic imaging of breast: Secondary | ICD-10-CM

## 2017-05-09 ENCOUNTER — Ambulatory Visit
Admission: RE | Admit: 2017-05-09 | Discharge: 2017-05-09 | Disposition: A | Discharge status: Home / Self Care | Payer: 59 | Source: Ambulatory Visit | Attending: General Surgery | Admitting: General Surgery

## 2017-05-09 DIAGNOSIS — R928 Other abnormal and inconclusive findings on diagnostic imaging of breast: Secondary | ICD-10-CM | POA: Diagnosis not present

## 2017-05-09 DIAGNOSIS — R921 Mammographic calcification found on diagnostic imaging of breast: Secondary | ICD-10-CM | POA: Diagnosis not present

## 2017-05-09 DIAGNOSIS — D242 Benign neoplasm of left breast: Secondary | ICD-10-CM | POA: Diagnosis not present

## 2017-05-09 HISTORY — PX: BREAST BIOPSY: SHX20

## 2017-05-10 ENCOUNTER — Telehealth: Payer: Self-pay | Admitting: General Surgery

## 2017-05-10 LAB — SURGICAL PATHOLOGY

## 2017-05-10 NOTE — Telephone Encounter (Signed)
Patient notified that yesterday stereotactic breast biopsy was benign.  She reports tolerating the procedure well.  Patient can resume annual screening mammograms with her PCP in February 2019.

## 2017-05-12 DIAGNOSIS — E113393 Type 2 diabetes mellitus with moderate nonproliferative diabetic retinopathy without macular edema, bilateral: Secondary | ICD-10-CM | POA: Diagnosis not present

## 2017-05-18 ENCOUNTER — Other Ambulatory Visit: Payer: Self-pay | Admitting: Family Medicine

## 2017-05-18 DIAGNOSIS — E1169 Type 2 diabetes mellitus with other specified complication: Secondary | ICD-10-CM

## 2017-05-18 DIAGNOSIS — J3089 Other allergic rhinitis: Secondary | ICD-10-CM

## 2017-05-18 DIAGNOSIS — I10 Essential (primary) hypertension: Secondary | ICD-10-CM

## 2017-05-18 DIAGNOSIS — E785 Hyperlipidemia, unspecified: Secondary | ICD-10-CM

## 2017-05-18 NOTE — Telephone Encounter (Signed)
Have enough to last until her appointment. Can refill at that time.

## 2017-05-18 NOTE — Telephone Encounter (Signed)
Will refill at her appointment. Have enough to last until then

## 2017-05-18 NOTE — Telephone Encounter (Signed)
Patient requesting refill of Azor, Flonase, Singular and Lovaza to [facility].

## 2017-06-02 ENCOUNTER — Encounter: Payer: Self-pay | Admitting: Family Medicine

## 2017-06-02 ENCOUNTER — Ambulatory Visit (INDEPENDENT_AMBULATORY_CARE_PROVIDER_SITE_OTHER): Payer: 59 | Admitting: Family Medicine

## 2017-06-02 VITALS — BP 140/60 | HR 101 | Resp 14 | Ht 67.0 in | Wt 214.9 lb

## 2017-06-02 DIAGNOSIS — I10 Essential (primary) hypertension: Secondary | ICD-10-CM | POA: Diagnosis not present

## 2017-06-02 DIAGNOSIS — E559 Vitamin D deficiency, unspecified: Secondary | ICD-10-CM | POA: Diagnosis not present

## 2017-06-02 DIAGNOSIS — K219 Gastro-esophageal reflux disease without esophagitis: Secondary | ICD-10-CM

## 2017-06-02 DIAGNOSIS — E785 Hyperlipidemia, unspecified: Secondary | ICD-10-CM

## 2017-06-02 DIAGNOSIS — J3089 Other allergic rhinitis: Secondary | ICD-10-CM

## 2017-06-02 DIAGNOSIS — Z1211 Encounter for screening for malignant neoplasm of colon: Secondary | ICD-10-CM

## 2017-06-02 DIAGNOSIS — E1169 Type 2 diabetes mellitus with other specified complication: Secondary | ICD-10-CM | POA: Diagnosis not present

## 2017-06-02 DIAGNOSIS — E1129 Type 2 diabetes mellitus with other diabetic kidney complication: Secondary | ICD-10-CM | POA: Diagnosis not present

## 2017-06-02 DIAGNOSIS — R928 Other abnormal and inconclusive findings on diagnostic imaging of breast: Secondary | ICD-10-CM

## 2017-06-02 DIAGNOSIS — R809 Proteinuria, unspecified: Secondary | ICD-10-CM

## 2017-06-02 DIAGNOSIS — M1611 Unilateral primary osteoarthritis, right hip: Secondary | ICD-10-CM | POA: Diagnosis not present

## 2017-06-02 LAB — POCT GLYCOSYLATED HEMOGLOBIN (HGB A1C): Hemoglobin A1C: 6.8

## 2017-06-02 MED ORDER — TRAMADOL HCL 50 MG PO TABS: 50.0000 mg | ORAL_TABLET | Freq: Two times a day (BID) | ORAL | 0 refills | Status: DC

## 2017-06-02 MED ORDER — DULAGLUTIDE 1.5 MG/0.5ML ~~LOC~~ SOAJ: 1.5000 mg | SUBCUTANEOUS | 2 refills | Status: DC

## 2017-06-02 MED ORDER — LEVOCETIRIZINE DIHYDROCHLORIDE 5 MG PO TABS: 5.0000 mg | ORAL_TABLET | Freq: Every evening | ORAL | 1 refills | Status: DC

## 2017-06-02 NOTE — Progress Notes (Signed)
Name: Maria Cruz   MRN: [id number]    DOB: [birth year]   Date:06/02/2017       Progress Note  Subjective  Chief Complaint  Chief Complaint  Patient presents with  . Diabetes  . Hyperlipidemia  . Hypertension    HPI    DM II with proteinuria: urine micro is elevatedat 100 (was 50 - 11/25/2015) continue ARB/CCB,Jentadueto, and was on Ozempic but ran out of samples about one month ago, insurance did not cover medication, she states GLP1 agonist helped curb her appetite , we will switch to Trulicity today.  HgbA1C is 7.3% today , it was 7.1% and 6.8% last time, today 6.8%  . At  home running  120's  fasting average, 150's post-prandially  She is denies polyphagia, polydipsia or  polyuria.  Hyperlipidemia: Doing well on Lovaza and atorvastatin - deniesmuscle aches.We will recheck labs today   HTN: Taking medication as prescribed, BPis at goal. Denies chest pain or palpations , no SOB. She denies side effects of medication.  Gout: no recent episodes, taking allopurinol daily, denies side effects. Last uric acid was slightly low (2.2) on 09/08/2016.  Obesity: lost 9 lbs since last visit and is also 5 lbs lighter than in 2017, we will fill out forms for [facility]   OA hips: Pt has not been to see ortho because she had an outstanding bill. She would like to pay bill and then will schedule appointment. Pain is worse on the left  side, taking Tylenol 500 mg once a day, advised to go up to three times a day, walking helps right hip pain, she would like to have a refill of Tramadol to take prn  Pain right now 8/10.  Patient Active Problem List   Diagnosis Date Noted  . Microcalcification of left breast on mammogram 05/03/2017  . Abnormal mammogram of left breast 04/29/2017  . Herniated intervertebral disc of lumbar spine 09/02/2016  . Spondylolisthesis of lumbar region 09/02/2016  . Controlled type 2 diabetes mellitus with microalbuminuria, without long-term current use of insulin  ([place]) 02/24/2015  . Vitamin D deficiency 02/24/2015  . Perennial allergic rhinitis 02/24/2015  . GERD without esophagitis 02/24/2015  . Hypertension, benign 02/24/2015  . Controlled gout 02/24/2015  . Fatty liver 02/24/2015  . Callus of foot 02/24/2015  . Primary osteoarthritis of right hip 02/24/2015  . History of carpal tunnel syndrome 02/24/2015  . Obesity (BMI 30-39.9) 02/24/2015  . Umbilical hernia without obstruction or gangrene 02/24/2015  . Intermittent low back pain 02/24/2015  . History of colon polyps 02/24/2015  . Dyslipidemia 02/24/2015    Past Surgical History:  Procedure Laterality Date  . ABDOMINAL HYSTERECTOMY  2012  . APPENDECTOMY  2012  . BREAST BIOPSY Right 07/12/2012   Negative  . BREAST BIOPSY Left 05/09/2017   Affirm Bx of two areas- path pending  . COLONOSCOPY  2009  . DILATION AND CURETTAGE OF UTERUS  2007  . TUBAL LIGATION      Family History  Problem Relation Age of Onset  . Multiple sclerosis Mother   . Heart attack Father   . Kidney failure Sister   . Heart attack Brother   . Diabetes Daughter   . CAD Sister   . Arthritis Sister        RA  . Lupus Sister   . Breast cancer Neg Hx     Social History   Social History  . Marital status: Single    Spouse name: N/A  . Number  of children: N/A  . Years of education: N/A   Occupational History  . Not on file.   Social History Main Topics  . Smoking status: Never Smoker  . Smokeless tobacco: Never Used  . Alcohol use No  . Drug use: No  . Sexual activity: Yes   Other Topics Concern  . Not on file   Social History Narrative  . No narrative on file     Current Outpatient Prescriptions:  .  acetaminophen (TYLENOL) 500 MG tablet, Take 1 tablet (500 mg total) by mouth every 6 (six) hours as needed., Disp: 90 tablet, Rfl: 0 .  allopurinol (ZYLOPRIM) 300 MG tablet, Take 1 tablet (300 mg total) by mouth 2 (two) times daily., Disp: 180 tablet, Rfl: 1 .  amLODipine-olmesartan (AZOR)  5-40 MG tablet, Take 1 tablet by mouth daily., Disp: 90 tablet, Rfl: 1 .  aspirin 81 MG chewable tablet, Chew 81 mg by mouth daily., Disp: , Rfl:  .  atorvastatin (LIPITOR) 20 MG tablet, Take 1 tablet (20 mg total) by mouth daily., Disp: 90 tablet, Rfl: 1 .  Coenzyme Q10 (CO Q 10 PO), Take by mouth daily., Disp: , Rfl:  .  fluticasone (FLONASE) 50 MCG/ACT nasal spray, Place 2 sprays into both nostrils daily., Disp: 48 g, Rfl: 1 .  Glucose Blood (FREESTYLE LITE TEST VI), , Disp: , Rfl:  .  Lancets (FREESTYLE) lancets, Check blood sugars as needed., Disp: 100 each, Rfl: 2 .  levocetirizine (XYZAL) 5 MG tablet, Take 1 tablet (5 mg total) by mouth every evening., Disp: 90 tablet, Rfl: 1 .  Linagliptin-Metformin HCl (JENTADUETO) 2.12-998 MG TABS, Take 2 tablets by mouth daily., Disp: , Rfl:  .  montelukast (SINGULAIR) 10 MG tablet, Take 1 tablet (10 mg total) by mouth daily., Disp: 90 tablet, Rfl: 1 .  omega-3 acid ethyl esters (LOVAZA) 1 g capsule, Take 2 capsules (2 g total) by mouth 2 (two) times daily., Disp: 360 capsule, Rfl: 1 .  traMADol (ULTRAM) 50 MG tablet, Take 1 tablet (50 mg total) by mouth 2 (two) times daily., Disp: 60 tablet, Rfl: 0 .  Vitamin D, Ergocalciferol, 2000 units CAPS, Take by mouth daily., Disp: , Rfl:  .  Dulaglutide (TRULICITY) 1.5 [place]/6.3ZC SOPN, Inject 1.5 mg into the skin once a week., Disp: 4 pen, Rfl: 2  Allergies  Allergen Reactions  . Ace Inhibitors Cough  . Lipofen [Fenofibrate] Other (See Comments)    Localized drug reaction, blister on back  . Penicillins Itching  . Sulfa Antibiotics Itching     ROS  Constitutional: Negative for fever or weight change.  Respiratory: Negative for cough and shortness of breath.   Cardiovascular: Negative for chest pain or palpitations.  Gastrointestinal: Negative for abdominal pain, no bowel changes.  Musculoskeletal: Positive  for gait problem but no  joint swelling.  Skin: Negative for rash.  Neurological: Negative  for dizziness or headache.  No other specific complaints in a complete review of systems (except as listed in HPI above).  Objective  Vitals:   06/02/17 1009  BP: 140/60  Pulse: (!) 101  Resp: 14  SpO2: 98%  Weight: 214 lb 14.4 oz (97.5 kg)  Height: 5\' 7"  (1.702 m)    Body mass index is 33.66 kg/m.  Physical Exam  Constitutional: Patient appears well-developed and well-nourished. Obese  No distress.  HEENT: head atraumatic, normocephalic, pupils equal and reactive to light,neck supple, throat within normal limits Cardiovascular: Normal rate, regular rhythm and normal heart sounds.  No murmur heard. No BLE edema. Pulmonary/Chest: Effort normal and breath sounds normal. No respiratory distress. Abdominal: Soft.  There is no tenderness. Psychiatric: Patient has a normal mood and affect. behavior is normal. Judgment and thought content normal. Muscular Skeletal: decrease rom of both hips  Recent Results (from the past 2160 hour(s))  Surgical pathology     Status: None   Collection Time: 05/09/17 10:30 AM  Result Value Ref Range   SURGICAL PATHOLOGY      Surgical Pathology CASE: [id number] PATIENT: Amberleigh Rapozo Surgical Pathology Report     SPECIMEN SUBMITTED: A. Breast, left, LOQ, posterior B. Breast, left, LOQ, anterior  CLINICAL HISTORY: 1.  Indeterminate calcifications.  2.  Indeterminate calcification  PRE-OPERATIVE DIAGNOSIS: 1.  DCIS  2.  DCIS  POST-OPERATIVE DIAGNOSIS: None provided.     DIAGNOSIS: A. BREAST, LEFT OUTER QUADRANT, POSTERIOR; STEREOTACTIC BIOPSY: - FIBROADENOMA WITH COARSE CALCIFICATION. - NEGATIVE FOR ATYPIA AND MALIGNANCY.  B. BREAST, LEFT OUTER QUADRANT, ANTERIOR; STEREOTACTIC BIOPSY: - COLLAPSED CYST AND FIBROADENOMATOID CHANGE WITH ASSOCIATED COARSE CALCIFICATIONS. - NEGATIVE FOR ATYPIA AND MALIGNANCY.   GROSS DESCRIPTION:  A. The specimen is received in a formalin-filled [place] collection device labeled with the  patient's name and left breast LOQ #1 posterior.  Core pieces: multiple Measurement: aggregate, 3.2 x 1.5 x 0.3 cm Comments: yellow lobulated fibrofatt y, marked blue Accompanying specimen radiograph: yes, section A,B,C  Entirely submitted in cassette(s):  1-section A 2-section B 3-section C 4-remaining tissue  Time/Date in fixative: collected at 1001 and placed in formalin at 10:06 AM on 05/09/2017 Total fixation time: 9.5 hours  The specimen is received in a formalin-filled [place] collection device labeled with the patient's name and left breast LOQ anterior #2.  Core pieces: multiple Measurement: aggregate, 2.2 x 1.8 x 0 .2 Comments: yellow lobulated fibrofatty, marked black Accompanying specimen radiograph: yes, sections E,I,L  Entirely submitted in cassette(s):  1-section E 2-section I 3-section L 4-6-remaining tissue   Time/Date in fixative: collected at 1022 placed in formalin at 10:26AM on 05/09/2017 Total fixation time: 9 hours Final Diagnosis performed by Quay Burow, MD.  Electronically signed 05/10/2017 1:42:07PM    The electronic signature indicates that the named Attending Pathologi st has evaluated the specimen  Technical component performed at [facility], [address] [place], [place] [address] Lab: [phone] Dir: Darrick Penna. Evette Doffing, MD  Professional component performed at [facility], [place], [place], [place] [address] Lab: [phone] Dir: Dellia Nims. Rubinas, MD       PHQ2/9: Depression screen [facility] 2/9 12/01/2016 09/02/2016 05/26/2016 02/24/2016 11/25/2015  Decreased Interest 0 0 0 0 0  Down, Depressed, Hopeless 0 0 0 0 0  PHQ - 2 Score 0 0 0 0 0     Fall Risk: Fall Risk  06/02/2017 12/01/2016 09/02/2016 05/26/2016 02/24/2016  Falls in the past year? No No No No No     Functional Status Survey: Is the patient deaf or have difficulty hearing?: No Does the patient have difficulty seeing, even when  wearing glasses/contacts?: No Does the patient have difficulty concentrating, remembering, or making decisions?: No Does the patient have difficulty walking or climbing stairs?: No Does the patient have difficulty dressing or bathing?: No Does the patient have difficulty doing errands alone such as visiting a doctor's office or shopping?: No    Assessment & Plan  1. Controlled type 2 diabetes mellitus with microalbuminuria, without long-term current use of insulin (HCC)  - POCT HgB A1C She has been out  of Ozempic for about one month, insurance denied coverage , she would like to try Trulicity  2. Hypertension, benign  Well controlled -comp panel  - CBC  3. Dyslipidemia  Continue statin therapy  -lipid panel   4. Primary osteoarthritis of right hip  - traMADol (ULTRAM) 50 MG tablet; Take 1 tablet (50 mg total) by mouth 2 (two) times daily.  Dispense: 60 tablet; Refill: 0  5. Dyslipidemia associated with type 2 diabetes mellitus ([place])   6. GERD without esophagitis  Under control at this time  7. Abnormal mammogram of left breast  Seen by Dr. Fleet Contras  8. Colon cancer screening  - Ambulatory referral to General Surgery  9. Perennial allergic rhinitis  - levocetirizine (XYZAL) 5 MG tablet; Take 1 tablet (5 mg total) by mouth every evening.  Dispense: 90 tablet; Refill: 1

## 2017-06-03 LAB — CBC WITH DIFFERENTIAL/PLATELET
BASOS ABS: 0 10*3/uL (ref 0.0–0.2)
Basos: 0 %
EOS (ABSOLUTE): 0.1 10*3/uL (ref 0.0–0.4)
Eos: 1 %
HEMOGLOBIN: 14 g/dL (ref 11.1–15.9)
Hematocrit: 41.8 % (ref 34.0–46.6)
IMMATURE GRANS (ABS): 0 10*3/uL (ref 0.0–0.1)
Immature Granulocytes: 0 %
LYMPHS ABS: 2.8 10*3/uL (ref 0.7–3.1)
LYMPHS: 46 %
MCH: 31.7 pg (ref 26.6–33.0)
MCHC: 33.5 g/dL (ref 31.5–35.7)
MCV: 95 fL (ref 79–97)
MONOCYTES: 8 %
Monocytes Absolute: 0.5 10*3/uL (ref 0.1–0.9)
NEUTROS ABS: 2.8 10*3/uL (ref 1.4–7.0)
Neutrophils: 45 %
PLATELETS: 281 10*3/uL (ref 150–379)
RBC: 4.42 x10E6/uL (ref 3.77–5.28)
RDW: 15 % (ref 12.3–15.4)
WBC: 6.2 10*3/uL (ref 3.4–10.8)

## 2017-06-03 LAB — LIPID PANEL
CHOLESTEROL TOTAL: 175 mg/dL (ref 100–199)
Chol/HDL Ratio: 3.5 ratio (ref 0.0–4.4)
HDL: 50 mg/dL (ref >39)
LDL Calculated: 80 mg/dL (ref 0–99)
Triglycerides: 226 mg/dL — ABNORMAL HIGH (ref 0–149)
VLDL CHOLESTEROL CAL: 45 mg/dL — AB (ref 5–40)

## 2017-06-03 LAB — COMPREHENSIVE METABOLIC PANEL
ALBUMIN: 4.9 g/dL — AB (ref 3.6–4.8)
ALT: 31 IU/L (ref 0–32)
AST: 39 IU/L (ref 0–40)
Albumin/Globulin Ratio: 1.8 (ref 1.2–2.2)
Alkaline Phosphatase: 98 IU/L (ref 39–117)
BUN / CREAT RATIO: 17 (ref 12–28)
BUN: 11 mg/dL (ref 8–27)
Bilirubin Total: 0.5 mg/dL (ref 0.0–1.2)
CO2: 23 mmol/L (ref 20–29)
CREATININE: 0.64 mg/dL (ref 0.57–1.00)
Calcium: 9.9 mg/dL (ref 8.7–10.3)
Chloride: 99 mmol/L (ref 96–106)
GFR, EST AFRICAN AMERICAN: 112 mL/min/{1.73_m2} (ref >59)
GFR, EST NON AFRICAN AMERICAN: 97 mL/min/{1.73_m2} (ref >59)
GLOBULIN, TOTAL: 2.8 g/dL (ref 1.5–4.5)
GLUCOSE: 114 mg/dL — AB (ref 65–99)
Potassium: 4.2 mmol/L (ref 3.5–5.2)
SODIUM: 142 mmol/L (ref 134–144)
TOTAL PROTEIN: 7.7 g/dL (ref 6.0–8.5)

## 2017-06-03 LAB — VITAMIN D 25 HYDROXY (VIT D DEFICIENCY, FRACTURES): Vit D, 25-Hydroxy: 44.1 ng/mL (ref 30.0–100.0)

## 2017-06-06 ENCOUNTER — Other Ambulatory Visit: Payer: Self-pay | Admitting: Family Medicine

## 2017-06-06 MED ORDER — EMPAGLIFLOZIN-METFORMIN HCL ER 25-1000 MG PO TB24
1.0000 | ORAL_TABLET | Freq: Every day | ORAL | 1 refills | Status: DC
Start: 2017-06-06 — End: 2017-09-05

## 2017-06-14 ENCOUNTER — Encounter: Payer: Self-pay | Admitting: General Surgery

## 2017-06-14 ENCOUNTER — Ambulatory Visit (INDEPENDENT_AMBULATORY_CARE_PROVIDER_SITE_OTHER): Payer: 59 | Admitting: General Surgery

## 2017-06-14 VITALS — BP 146/84 | HR 92 | Resp 14 | Ht 67.0 in | Wt 218.0 lb

## 2017-06-14 DIAGNOSIS — Z86010 Personal history of colonic polyps: Secondary | ICD-10-CM

## 2017-06-14 MED ORDER — POLYETHYLENE GLYCOL 3350 17 GM/SCOOP PO POWD: 1.0000 | Freq: Once | ORAL | 0 refills | Status: AC

## 2017-06-14 NOTE — Patient Instructions (Addendum)
Colonoscopy, Adult A colonoscopy is an exam to look at the entire large intestine. During the exam, a lubricated, bendable tube is inserted into the anus and then passed into the rectum, colon, and other parts of the large intestine. A colonoscopy is often done as a part of normal colorectal screening or in response to certain symptoms, such as anemia, persistent diarrhea, abdominal pain, and blood in the stool. The exam can help screen for and diagnose medical problems, including:  Tumors.  Polyps.  Inflammation.  Areas of bleeding.  Tell a health care provider about:  Any allergies you have.  All medicines you are taking, including vitamins, herbs, eye drops, creams, and over-the-counter medicines.  Any problems you or family members have had with anesthetic medicines.  Any blood disorders you have.  Any surgeries you have had.  Any medical conditions you have.  Any problems you have had passing stool. What are the risks? Generally, this is a safe procedure. However, problems may occur, including:  Bleeding.  A tear in the intestine.  A reaction to medicines given during the exam.  Infection (rare).  What happens before the procedure? Eating and drinking restrictions Follow instructions from your health care provider about eating and drinking, which may include:  A few days before the procedure - follow a low-fiber diet. Avoid nuts, seeds, dried fruit, raw fruits, and vegetables.  1-3 days before the procedure - follow a clear liquid diet. Drink only clear liquids, such as clear broth or bouillon, black coffee or tea, clear juice, clear soft drinks or sports drinks, gelatin dessert, and popsicles. Avoid any liquids that contain red or purple dye.  On the day of the procedure - do not eat or drink anything during the 2 hours before the procedure, or within the time period that your health care provider recommends.  Bowel prep If you were prescribed an oral bowel prep  to clean out your colon:  Take it as told by your health care provider. Starting the day before your procedure, you will need to drink a large amount of medicated liquid. The liquid will cause you to have multiple loose stools until your stool is almost clear or light green.  If your skin or anus gets irritated from diarrhea, you may use these to relieve the irritation: ? Medicated wipes, such as adult wet wipes with aloe and vitamin E. ? A skin soothing-product like petroleum jelly.  If you vomit while drinking the bowel prep, take a break for up to 60 minutes and then begin the bowel prep again. If vomiting continues and you cannot take the bowel prep without vomiting, call your health care provider.  General instructions  Ask your health care provider about changing or stopping your regular medicines. This is especially important if you are taking diabetes medicines or blood thinners.  Plan to have someone take you home from the hospital or clinic. What happens during the procedure?  An IV tube may be inserted into one of your veins.  You will be given medicine to help you relax (sedative).  To reduce your risk of infection: ? Your health care team will wash or sanitize their hands. ? Your anal area will be washed with soap.  You will be asked to lie on your side with your knees bent.  Your health care provider will lubricate a long, thin, flexible tube. The tube will have a camera and a light on the end.  The tube will be inserted into your   anus.  The tube will be gently eased through your rectum and colon.  Air will be delivered into your colon to keep it open. You may feel some pressure or cramping.  The camera will be used to take images during the procedure.  A small tissue sample may be removed from your body to be examined under a microscope (biopsy). If any potential problems are found, the tissue will be sent to a lab for testing.  If small polyps are found, your  health care provider may remove them and have them checked for cancer cells.  The tube that was inserted into your anus will be slowly removed. The procedure may vary among health care providers and hospitals. What happens after the procedure?  Your blood pressure, heart rate, breathing rate, and blood oxygen level will be monitored until the medicines you were given have worn off.  Do not drive for 24 hours after the exam.  You may have a small amount of blood in your stool.  You may pass gas and have mild abdominal cramping or bloating due to the air that was used to inflate your colon during the exam.  It is up to you to get the results of your procedure. Ask your health care provider, or the department performing the procedure, when your results will be ready. This information is not intended to replace advice given to you by your health care provider. Make sure you discuss any questions you have with your health care provider. Document Released: 07/23/2000 Document Revised: 05/26/2016 Document Reviewed: 10/07/2015 Elsevier Interactive Patient Education  [employer].  The patient is scheduled for a Colonoscopy at [facility] on 07/27/17. They are aware to call the day before to get their arrival time. She will stop her Fish Oil one week prior. She will hold her Synjardy XR the day of prep and procedure. She will only take her blood pressure medication the morning of at 6 am with a sip of water. Miralax prescription has been sent into the patient's pharmacy. The patient is aware of date and instructions.

## 2017-06-14 NOTE — Progress Notes (Signed)
Patient ID: Maria Cruz, female   DOB: 1956/07/04, 61 y.o.   MRN: [id number]  Chief Complaint  Patient presents with  . Colonoscopy    HPI Maria Cruz is a 61 y.o. female.  Who presents for a colonoscopy discussion referred by Dr Ancil Boozer. The last colonoscopy was completed in 2009. Denies any gastrointestinal issues. Bowels move regular and no bleeding noted. She works for [employer].  HPI  Past Medical History:  Diagnosis Date  . Allergy   . Back pain with radiation   . Calluse    Foot  . Colon polyp   . Diabetes mellitus without complication ([place]) 9937  . Fatty liver   . GERD (gastroesophageal reflux disease)   . Hyperlipidemia   . Hypertension   . Lumbar radiculopathy    Dr. Mack Guise  . Obesity   . Plantar fasciitis, left   . Vitamin D deficiency     Past Surgical History:  Procedure Laterality Date  . ABDOMINAL HYSTERECTOMY  2012  . APPENDECTOMY  2012  . BREAST BIOPSY Right 07/12/2012   Negative  . BREAST BIOPSY Left 05/09/2017   Affirm Bx of two areas- path pending  . COLONOSCOPY  2009   Dr Allen Norris  . DILATION AND CURETTAGE OF UTERUS  2007  . TUBAL LIGATION      Family History  Problem Relation Age of Onset  . Multiple sclerosis Mother   . Heart attack Father   . Kidney failure Sister   . Heart attack Brother   . Diabetes Daughter   . CAD Sister   . Arthritis Sister        RA  . Lupus Sister   . Breast cancer Neg Hx   . Colon cancer Neg Hx     Social History Social History   Tobacco Use  . Smoking status: Never Smoker  . Smokeless tobacco: Never Used  Substance Use Topics  . Alcohol use: No    Alcohol/week: 0.0 oz  . Drug use: No    Allergies  Allergen Reactions  . Ace Inhibitors Cough  . Lipofen [Fenofibrate] Other (See Comments)    Localized drug reaction, blister on back  . Penicillins Itching  . Sulfa Antibiotics Itching    Current Outpatient Medications  Medication Sig Dispense Refill  . acetaminophen (TYLENOL) 500 MG tablet  Take 1 tablet (500 mg total) by mouth every 6 (six) hours as needed. 90 tablet 0  . allopurinol (ZYLOPRIM) 300 MG tablet Take 1 tablet (300 mg total) by mouth 2 (two) times daily. 180 tablet 1  . amLODipine-olmesartan (AZOR) 5-40 MG tablet Take 1 tablet by mouth daily. 90 tablet 1  . aspirin 81 MG chewable tablet Chew 81 mg by mouth daily.    Marland Kitchen atorvastatin (LIPITOR) 20 MG tablet Take 1 tablet (20 mg total) by mouth daily. 90 tablet 1  . Coenzyme Q10 (CO Q 10 PO) Take by mouth daily.    . Dulaglutide (TRULICITY) 1.5 JI/9.6VE SOPN Inject 1.5 mg into the skin once a week. 4 pen 2  . Empagliflozin-Metformin HCl ER (SYNJARDY XR) 25-1000 MG TB24 Take 1 tablet by mouth daily. In place of Jardiance and jentaduetoContinue Trulicity 90 tablet 1  . fluticasone (FLONASE) 50 MCG/ACT nasal spray Place 2 sprays into both nostrils daily. 48 g 1  . Glucose Blood (FREESTYLE LITE TEST VI)     . Lancets (FREESTYLE) lancets Check blood sugars as needed. 100 each 2  . levocetirizine (XYZAL) 5 MG tablet Take 1 tablet (  5 mg total) by mouth every evening. 90 tablet 1  . montelukast (SINGULAIR) 10 MG tablet Take 1 tablet (10 mg total) by mouth daily. 90 tablet 1  . omega-3 acid ethyl esters (LOVAZA) 1 g capsule Take 2 capsules (2 g total) by mouth 2 (two) times daily. 360 capsule 1  . traMADol (ULTRAM) 50 MG tablet Take 1 tablet (50 mg total) by mouth 2 (two) times daily. 60 tablet 0  . Vitamin D, Ergocalciferol, 2000 units CAPS Take by mouth daily.     No current facility-administered medications for this visit.     Review of Systems Review of Systems  Constitutional: Negative.   Respiratory: Negative.   Cardiovascular: Negative.   Gastrointestinal: Negative for constipation and diarrhea.    Blood pressure (!) 146/84, pulse 92, resp. rate 14, height 5\' 7"  (1.702 m), weight 218 lb (98.9 kg), SpO2 99 %.  Physical Exam Physical Exam  Constitutional: She is oriented to person, place, and time. She appears  well-developed and well-nourished.  HENT:  Mouth/Throat: Oropharynx is clear and moist.  Eyes: Conjunctivae are normal. No scleral icterus.  Neck: Neck supple.  Cardiovascular: Normal rate, regular rhythm and normal heart sounds.  Pulmonary/Chest: Effort normal and breath sounds normal.  Lymphadenopathy:    She has no cervical adenopathy.  Neurological: She is alert and oriented to person, place, and time.  Skin: Skin is warm and dry.  Psychiatric: Her behavior is normal.    Data Reviewed Left breast stereotactic biopsy of 05/09/2017: DIAGNOSIS:  A. BREAST, LEFT OUTER QUADRANT, POSTERIOR; STEREOTACTIC BIOPSY:  - FIBROADENOMA WITH COARSE CALCIFICATION.  - NEGATIVE FOR ATYPIA AND MALIGNANCY.   B. BREAST, LEFT OUTER QUADRANT, ANTERIOR; STEREOTACTIC BIOPSY:  - COLLAPSED CYST AND FIBROADENOMATOID CHANGE WITH ASSOCIATED COARSE  CALCIFICATIONS.  - NEGATIVE FOR ATYPIA AND MALIGNANCY.   Colonoscopy report of 06/25/2008 completed by Lucilla Lame, MD reported a 7 mm polyp in the ascending colon. Pathology showed a benign lymphoid aggregate. Random biopsies of the colon were negative for malignancy or colitis.  Assessment    Candidate for screening colonoscopy.    Plan    Colonoscopy with possible biopsy/polypectomy prn: Information regarding the procedure, including its potential risks and complications (including but not limited to perforation of the bowel, which may require emergency surgery to repair, and bleeding) was verbally given to the patient. Educational information regarding lower intestinal endoscopy was given to the patient. Written instructions for how to complete the bowel prep using Miralax were provided. The importance of drinking ample fluids to avoid dehydration as a result of the prep emphasized.    HPI, Physical Exam, Assessment and Plan have been scribed under the direction and in the presence of Robert Bellow, MD. Karie Fetch, RN   I have completed the exam  and reviewed the above documentation for accuracy and completeness.  I agree with the above.  Haematologist has been used and any errors in dictation or transcription are unintentional.  Hervey Ard, M.D., F.A.C.S.  The patient is scheduled for a Colonoscopy at [facility] on 07/27/17. They are aware to call the day before to get their arrival time. She will stop her Fish Oil one week prior. She will hold her Synjardy XR the day of prep and procedure. She will only take her blood pressure medication the morning of at 6 am with a sip of water. Miralax prescription has been sent into the patient's pharmacy. The patient is aware of date and instructions.  Documented by Cleotis Lema  [facility] LPN    Robert Bellow 06/15/2017, 7:24 PM

## 2017-07-06 ENCOUNTER — Other Ambulatory Visit: Payer: Self-pay | Admitting: Family Medicine

## 2017-07-06 DIAGNOSIS — I10 Essential (primary) hypertension: Secondary | ICD-10-CM

## 2017-07-06 DIAGNOSIS — E785 Hyperlipidemia, unspecified: Secondary | ICD-10-CM

## 2017-07-06 DIAGNOSIS — J3089 Other allergic rhinitis: Secondary | ICD-10-CM

## 2017-07-06 DIAGNOSIS — E1169 Type 2 diabetes mellitus with other specified complication: Secondary | ICD-10-CM

## 2017-07-21 ENCOUNTER — Telehealth: Payer: Self-pay | Admitting: *Deleted

## 2017-07-21 NOTE — Telephone Encounter (Signed)
Patient was contacted today and confirms no medication changes since her last office visit. She reports that she has already stopped taking fish oil. Also, reminded about holding Synjardy day of colonoscopy prep and procedure.   This patient reports that she has picked up Miralax prescription.  We will proceed with colonoscopy as scheduled for 07-27-17 at [facility].   Patient was encouraged to call the office should she have further questions.

## 2017-07-27 ENCOUNTER — Encounter
Admission: RE | Disposition: A | Discharge status: Home / Self Care | Payer: Self-pay | Source: Ambulatory Visit | Attending: General Surgery

## 2017-07-27 ENCOUNTER — Ambulatory Visit
Admission: RE | Admit: 2017-07-27 | Discharge: 2017-07-27 | Disposition: A | Discharge status: Home / Self Care | Payer: 59 | Source: Ambulatory Visit | Attending: General Surgery | Admitting: General Surgery

## 2017-07-27 ENCOUNTER — Ambulatory Visit: Payer: 59 | Admitting: Anesthesiology

## 2017-07-27 ENCOUNTER — Encounter: Payer: Self-pay | Admitting: General Surgery

## 2017-07-27 ENCOUNTER — Encounter: Payer: Self-pay | Admitting: *Deleted

## 2017-07-27 DIAGNOSIS — Z79899 Other long term (current) drug therapy: Secondary | ICD-10-CM | POA: Insufficient documentation

## 2017-07-27 DIAGNOSIS — K6389 Other specified diseases of intestine: Secondary | ICD-10-CM | POA: Diagnosis not present

## 2017-07-27 DIAGNOSIS — Z1211 Encounter for screening for malignant neoplasm of colon: Secondary | ICD-10-CM | POA: Diagnosis not present

## 2017-07-27 DIAGNOSIS — E669 Obesity, unspecified: Secondary | ICD-10-CM | POA: Diagnosis not present

## 2017-07-27 DIAGNOSIS — E785 Hyperlipidemia, unspecified: Secondary | ICD-10-CM | POA: Insufficient documentation

## 2017-07-27 DIAGNOSIS — Z880 Allergy status to penicillin: Secondary | ICD-10-CM | POA: Insufficient documentation

## 2017-07-27 DIAGNOSIS — Z7982 Long term (current) use of aspirin: Secondary | ICD-10-CM | POA: Insufficient documentation

## 2017-07-27 DIAGNOSIS — Z882 Allergy status to sulfonamides status: Secondary | ICD-10-CM | POA: Diagnosis not present

## 2017-07-27 DIAGNOSIS — Z888 Allergy status to other drugs, medicaments and biological substances status: Secondary | ICD-10-CM | POA: Insufficient documentation

## 2017-07-27 DIAGNOSIS — K219 Gastro-esophageal reflux disease without esophagitis: Secondary | ICD-10-CM | POA: Insufficient documentation

## 2017-07-27 DIAGNOSIS — Z86010 Personal history of colonic polyps: Secondary | ICD-10-CM

## 2017-07-27 DIAGNOSIS — Z7984 Long term (current) use of oral hypoglycemic drugs: Secondary | ICD-10-CM | POA: Diagnosis not present

## 2017-07-27 DIAGNOSIS — Z791 Long term (current) use of non-steroidal anti-inflammatories (NSAID): Secondary | ICD-10-CM | POA: Insufficient documentation

## 2017-07-27 DIAGNOSIS — E559 Vitamin D deficiency, unspecified: Secondary | ICD-10-CM | POA: Diagnosis not present

## 2017-07-27 DIAGNOSIS — I10 Essential (primary) hypertension: Secondary | ICD-10-CM | POA: Diagnosis not present

## 2017-07-27 DIAGNOSIS — M1990 Unspecified osteoarthritis, unspecified site: Secondary | ICD-10-CM | POA: Diagnosis not present

## 2017-07-27 DIAGNOSIS — Z6834 Body mass index (BMI) 34.0-34.9, adult: Secondary | ICD-10-CM | POA: Insufficient documentation

## 2017-07-27 DIAGNOSIS — E119 Type 2 diabetes mellitus without complications: Secondary | ICD-10-CM | POA: Insufficient documentation

## 2017-07-27 HISTORY — PX: COLONOSCOPY WITH PROPOFOL: SHX5780

## 2017-07-27 LAB — GLUCOSE, CAPILLARY: GLUCOSE-CAPILLARY: 166 mg/dL — AB (ref 65–99)

## 2017-07-27 SURGERY — COLONOSCOPY WITH PROPOFOL
Anesthesia: General

## 2017-07-27 MED ORDER — PROPOFOL 10 MG/ML IV BOLUS
INTRAVENOUS | Status: AC
  Filled 2017-07-27: qty 20

## 2017-07-27 MED ORDER — LIDOCAINE 2% (20 MG/ML) 5 ML SYRINGE
INTRAMUSCULAR | Status: DC | PRN
  Administered 2017-07-27: 40 mg via INTRAVENOUS

## 2017-07-27 MED ORDER — SODIUM CHLORIDE 0.9 % IV SOLN
INTRAVENOUS | Status: DC
  Administered 2017-07-27: 08:00:00 via INTRAVENOUS

## 2017-07-27 MED ORDER — SODIUM CHLORIDE 0.9 % IV SOLN
INTRAVENOUS | Status: DC | PRN
  Administered 2017-07-27: 08:00:00 via INTRAVENOUS

## 2017-07-27 MED ORDER — PHENYLEPHRINE HCL 10 MG/ML IJ SOLN
INTRAMUSCULAR | Status: DC | PRN
  Administered 2017-07-27: 100 ug via INTRAVENOUS

## 2017-07-27 MED ORDER — PROPOFOL 10 MG/ML IV BOLUS
INTRAVENOUS | Status: DC | PRN
Start: 2017-07-27 — End: 2017-07-27
  Administered 2017-07-27: 100 mg via INTRAVENOUS
  Administered 2017-07-27: 20 mg via INTRAVENOUS

## 2017-07-27 MED ORDER — PROPOFOL 500 MG/50ML IV EMUL
INTRAVENOUS | Status: AC
  Filled 2017-07-27: qty 50

## 2017-07-27 MED ORDER — FENTANYL CITRATE (PF) 100 MCG/2ML IJ SOLN
INTRAMUSCULAR | Status: DC | PRN
  Administered 2017-07-27 (×2): 50 ug via INTRAVENOUS

## 2017-07-27 MED ORDER — FENTANYL CITRATE (PF) 100 MCG/2ML IJ SOLN
INTRAMUSCULAR | Status: AC
  Filled 2017-07-27: qty 2

## 2017-07-27 MED ORDER — PROPOFOL 500 MG/50ML IV EMUL
INTRAVENOUS | Status: DC | PRN
  Administered 2017-07-27: 150 ug/kg/min via INTRAVENOUS

## 2017-07-27 NOTE — Op Note (Signed)
[facility] Patient Name: Maria Cruz Procedure Date: 07/27/2017 8:14 AM MRN: [id number] Account #: [id number] Date of Birth: 1955-11-09 Admit Type: Outpatient Age: 61 Room: [facility] ENDO ROOM 1 Gender: Female Note Status: Finalized Procedure:            Colonoscopy Indications:          Screening for colorectal malignant neoplasm Providers:            Robert Bellow, MD Referring MD:         Bethena Roys. Sowles, MD (Referring MD) Medicines:            Monitored Anesthesia Care Complications:        No immediate complications. Procedure:            Pre-Anesthesia Assessment:                       - Prior to the procedure, a History and Physical was                        performed, and patient medications, allergies and                        sensitivities were reviewed. The patient's tolerance of                        previous anesthesia was reviewed.                       - The risks and benefits of the procedure and the                        sedation options and risks were discussed with the                        patient. All questions were answered and informed                        consent was obtained.                       After obtaining informed consent, the colonoscope was                        passed under direct vision. Throughout the procedure,                        the patient's blood pressure, pulse, and oxygen                        saturations were monitored continuously. The                        Colonoscope was introduced through the anus and                        advanced to the the cecum, identified by appendiceal                        orifice and ileocecal valve. The colonoscopy was  technically difficult and complex due to significant                        looping and a tortuous colon. Successful completion of                        the procedure was aided by changing the patient to a                  prone position. The patient tolerated the procedure                        well. The quality of the bowel preparation was                        excellent. Findings:      The entire examined colon appeared normal on direct and retroflexion       views. Impression:           - The entire examined colon is normal on direct and                        retroflexion views.                       - No specimens collected. Recommendation:       - Discharge patient to home (via wheelchair).                       - Repeat colonoscopy in 10 years for screening purposes. Procedure Code(s):    --- Professional ---                       [phone], Colonoscopy, flexible; diagnostic, including                        collection of specimen(s) by brushing or washing, when                        performed (separate procedure) Diagnosis Code(s):    --- Professional ---                       Z12.11, Encounter for screening for malignant neoplasm                        of colon CPT copyright 2016 American Medical Association. All rights reserved. The codes documented in this report are preliminary and upon coder review may  be revised to meet current compliance requirements. Robert Bellow, MD 07/27/2017 9:14:04 AM This report has been signed electronically. Number of Addenda: 0 Note Initiated On: 07/27/2017 8:14 AM Scope Withdrawal Time: 0 hours 8 minutes 54 seconds  Total Procedure Duration: 0 hours 45 minutes 12 seconds       [facility]

## 2017-07-27 NOTE — Transfer of Care (Signed)
Immediate Anesthesia Transfer of Care Note  Patient: Maria Cruz  Procedure(s) Performed: COLONOSCOPY WITH PROPOFOL (N/A )  Patient Location: PACU and Endoscopy Unit  Anesthesia Type:General  Level of Consciousness: awake, drowsy and patient cooperative  Airway & Oxygen Therapy: Patient Spontanous Breathing and Patient connected to nasal cannula oxygen  Post-op Assessment: Report given to RN and Post -op Vital signs reviewed and stable  Post vital signs: Reviewed and stable  Last Vitals:  Vitals:   07/27/17 0723 07/27/17 0920  BP: (!) 148/85 104/70  Pulse: 93 91  Resp: 14 (!) 21  Temp: (!) 36.3 C (!) 36.3 C  SpO2: 100% 100%    Last Pain:  Vitals:   07/27/17 0723  TempSrc: Tympanic         Complications: No apparent anesthesia complications

## 2017-07-27 NOTE — Anesthesia Post-op Follow-up Note (Signed)
Anesthesia QCDR form completed.        

## 2017-07-27 NOTE — Anesthesia Postprocedure Evaluation (Signed)
Anesthesia Post Note  Patient: Maria Cruz  Procedure(s) Performed: COLONOSCOPY WITH PROPOFOL (N/A )  Patient location during evaluation: PACU Anesthesia Type: General Level of consciousness: awake Pain management: pain level controlled Vital Signs Assessment: post-procedure vital signs reviewed and stable Respiratory status: spontaneous breathing Cardiovascular status: stable Anesthetic complications: no     Last Vitals:  Vitals:   07/27/17 0940 07/27/17 0950  BP: 107/74 114/76  Pulse: 83 80  Resp: 15 18  Temp:    SpO2: 98% 99%    Last Pain:  Vitals:   07/27/17 0723  TempSrc: Tympanic                 [name],Maria Cruz

## 2017-07-27 NOTE — H&P (Signed)
Maria Cruz [id number] 1956/07/21     HPI: Healthy 61 year old woman for screening colonoscopy.  She reports tolerating the prep well.  Preprocedure glucose: 166.  Medications Prior to Admission  Medication Sig Dispense Refill Last Dose  . allopurinol (ZYLOPRIM) 300 MG tablet Take 1 tablet (300 mg total) by mouth 2 (two) times daily. 180 tablet 1 07/26/2017 at Unknown time  . amLODipine-olmesartan (AZOR) 5-40 MG tablet Take 1 tablet by mouth daily. 90 tablet 1 07/27/2017 at 0600  . aspirin 81 MG chewable tablet Chew 81 mg by mouth daily.   07/26/2017 at Unknown time  . atorvastatin (LIPITOR) 20 MG tablet Take 1 tablet (20 mg total) by mouth daily. 90 tablet 1 07/26/2017 at Unknown time  . Coenzyme Q10 (CO Q 10 PO) Take by mouth daily.   Past Week at Unknown time  . Dulaglutide (TRULICITY) 1.5 IN/8.6VE SOPN Inject 1.5 mg into the skin once a week. 4 pen 2 Past Week at Unknown time  . Empagliflozin-Metformin HCl ER (SYNJARDY XR) 25-1000 MG TB24 Take 1 tablet by mouth daily. In place of Jardiance and jentaduetoContinue Trulicity 90 tablet 1 72/04/4708 at Unknown time  . fluticasone (FLONASE) 50 MCG/ACT nasal spray Place 2 sprays into both nostrils daily. 48 g 1 Past Week at Unknown time  . montelukast (SINGULAIR) 10 MG tablet Take 1 tablet (10 mg total) by mouth daily. 90 tablet 1 07/26/2017 at Unknown time  . omega-3 acid ethyl esters (LOVAZA) 1 g capsule Take 2 capsules (2 g total) by mouth 2 (two) times daily. 360 capsule 1 Past Week at Unknown time  . traMADol (ULTRAM) 50 MG tablet Take 1 tablet (50 mg total) by mouth 2 (two) times daily. 60 tablet 0 Past Week at Unknown time  . Vitamin D, Ergocalciferol, 2000 units CAPS Take by mouth daily.   07/26/2017 at Unknown time  . acetaminophen (TYLENOL) 500 MG tablet Take 1 tablet (500 mg total) by mouth every 6 (six) hours as needed. 90 tablet 0 Taking  . Glucose Blood (FREESTYLE LITE TEST VI)    Taking  . Lancets (FREESTYLE) lancets Check blood  sugars as needed. 100 each 2 Taking  . levocetirizine (XYZAL) 5 MG tablet Take 1 tablet (5 mg total) by mouth every evening. 90 tablet 1 Taking   Allergies  Allergen Reactions  . Ace Inhibitors Cough  . Lipofen [Fenofibrate] Other (See Comments)    Localized drug reaction, blister on back  . Penicillins Itching  . Sulfa Antibiotics Itching   Past Medical History:  Diagnosis Date  . Allergy   . Back pain with radiation   . Calluse    Foot  . Colon polyp   . Diabetes mellitus without complication ([place]) 6283  . Fatty liver   . GERD (gastroesophageal reflux disease)   . Hyperlipidemia   . Hypertension   . Lumbar radiculopathy    Dr. Mack Guise  . Obesity   . Plantar fasciitis, left   . Vitamin D deficiency    Past Surgical History:  Procedure Laterality Date  . ABDOMINAL HYSTERECTOMY  2012  . APPENDECTOMY  2012  . BREAST BIOPSY Right 07/12/2012   Negative  . BREAST BIOPSY Left 05/09/2017   Affirm Bx of two areas- path pending  . COLONOSCOPY  2009   Dr Allen Norris  . DILATION AND CURETTAGE OF UTERUS  2007  . TUBAL LIGATION     Social History   Socioeconomic History  . Marital status: Single    Spouse name: Not  on file  . Number of children: Not on file  . Years of education: Not on file  . Highest education level: Not on file  Social Needs  . Financial resource strain: Not on file  . Food insecurity - worry: Not on file  . Food insecurity - inability: Not on file  . Transportation needs - medical: Not on file  . Transportation needs - non-medical: Not on file  Occupational History  . Not on file  Tobacco Use  . Smoking status: Never Smoker  . Smokeless tobacco: Never Used  Substance and Sexual Activity  . Alcohol use: No    Alcohol/week: 0.0 oz  . Drug use: No  . Sexual activity: Yes  Other Topics Concern  . Not on file  Social History Narrative  . Not on file   Social History   Social History Narrative  . Not on file     ROS: Negative.      PE: HEENT: Negative. Lungs: Clear. Cardio: RR. Assessment/Plan:  Proceed with planned endoscopy.  Maria Cruz 07/27/2017

## 2017-07-27 NOTE — Anesthesia Preprocedure Evaluation (Signed)
Anesthesia Evaluation  Patient identified by MRN, date of birth, ID band Patient awake    Reviewed: Allergy & Precautions, NPO status , Patient's Chart, lab work & pertinent test results  Airway Mallampati: II       Dental  (+) Teeth Intact   Pulmonary neg pulmonary ROS,     + decreased breath sounds      Cardiovascular Exercise Tolerance: Good hypertension, Pt. on home beta blockers  Rhythm:Regular     Neuro/Psych negative psych ROS   GI/Hepatic Neg liver ROS, GERD  Medicated,  Endo/Other  diabetes, Type 2, Oral Hypoglycemic Agents  Renal/GU      Musculoskeletal  (+) Arthritis ,   Abdominal (+) + obese,   Peds negative pediatric ROS (+)  Hematology negative hematology ROS (+)   Anesthesia Other Findings   Reproductive/Obstetrics                             Anesthesia Physical Anesthesia Plan  ASA: III  Anesthesia Plan: General   Post-op Pain Management:    Induction: Intravenous  PONV Risk Score and Plan:   Airway Management Planned: Natural Airway and Nasal Cannula  Additional Equipment:   Intra-op Plan:   Post-operative Plan:   Informed Consent: I have reviewed the patients History and Physical, chart, labs and discussed the procedure including the risks, benefits and alternatives for the proposed anesthesia with the patient or authorized representative who has indicated his/her understanding and acceptance.     Plan Discussed with: CRNA  Anesthesia Plan Comments:         Anesthesia Quick Evaluation

## 2017-07-28 ENCOUNTER — Encounter: Payer: Self-pay | Admitting: General Surgery

## 2017-07-28 NOTE — OR Nursing (Signed)
Post-op call reveals pt has some abdominal pain during the hs. She also fellt sweaty. Does not know temperature. According to patient stomach is soft today. Advised pt to call md. If severe pain or temperature.

## 2017-08-31 ENCOUNTER — Ambulatory Visit: Payer: 59 | Admitting: Family Medicine

## 2017-09-01 ENCOUNTER — Other Ambulatory Visit: Payer: Self-pay | Admitting: Family Medicine

## 2017-09-01 DIAGNOSIS — I10 Essential (primary) hypertension: Secondary | ICD-10-CM

## 2017-09-01 DIAGNOSIS — J3089 Other allergic rhinitis: Secondary | ICD-10-CM

## 2017-09-01 DIAGNOSIS — E1169 Type 2 diabetes mellitus with other specified complication: Secondary | ICD-10-CM

## 2017-09-01 DIAGNOSIS — E785 Hyperlipidemia, unspecified: Secondary | ICD-10-CM

## 2017-09-01 MED ORDER — AMLODIPINE-OLMESARTAN 5-40 MG PO TABS: 1.0000 | ORAL_TABLET | Freq: Every day | ORAL | 1 refills | Status: DC

## 2017-09-01 NOTE — Telephone Encounter (Signed)
Pt requesting a refill on amLODipine.

## 2017-09-01 NOTE — Telephone Encounter (Signed)
Copied from [place]. Topic: Quick Communication - Rx Refill/Question >> Sep 01, 2017  9:57 AM Antonieta Iba C wrote:    Medication: amLODipine   Has the patient contacted their pharmacy? no   (Agent: If no, request that the patient contact the pharmacy for the refill.)   Preferred Pharmacy (with phone number or street name): [facility] on [address] in [place]: Please be advised that RX refills may take up to 3 business days. We ask that you follow-up with your pharmacy.

## 2017-09-02 ENCOUNTER — Other Ambulatory Visit: Payer: Self-pay

## 2017-09-02 DIAGNOSIS — E1169 Type 2 diabetes mellitus with other specified complication: Secondary | ICD-10-CM

## 2017-09-02 DIAGNOSIS — R809 Proteinuria, unspecified: Principal | ICD-10-CM

## 2017-09-02 DIAGNOSIS — E785 Hyperlipidemia, unspecified: Secondary | ICD-10-CM

## 2017-09-02 DIAGNOSIS — E1129 Type 2 diabetes mellitus with other diabetic kidney complication: Secondary | ICD-10-CM

## 2017-09-02 NOTE — Telephone Encounter (Signed)
LFT MESSAGE FOR THE PATIENT NEEDS APPT FOR MEDREFIL ON HER MEDICATIONS

## 2017-09-02 NOTE — Telephone Encounter (Signed)
Please call and schedule appointment for patient for further refills.

## 2017-09-02 NOTE — Telephone Encounter (Signed)
Refill request for diabetic medication:   Trulicity   Last office visit pertaining to diabetes: 06/02/2017   Lab Results  Component Value Date   HGBA1C 6.8 06/02/2017    Follow-up on file. 08/31/2017

## 2017-09-02 NOTE — Telephone Encounter (Signed)
Refill request for Hypertension medication:  Azor 5-40 mg  Last office visit pertaining to hypertension: 06/02/2017  BP Readings from Last 3 Encounters:  07/27/17 114/76  06/14/17 (!) 146/84  06/02/17 140/60    Lab Results  Component Value Date   CREATININE 0.64 06/02/2017   BUN 11 06/02/2017   NA 142 06/02/2017   K 4.2 06/02/2017   CL 99 06/02/2017   CO2 23 06/02/2017   Follow-up on file. None indicated  Refill Request for Cholesterol medication. Atorvastatin 20 mg  Last physical: None indicated  Lab Results  Component Value Date   CHOL 175 06/02/2017   HDL 50 06/02/2017   LDLCALC 80 06/02/2017   TRIG 226 (H) 06/02/2017   CHOLHDL 3.5 06/02/2017

## 2017-09-05 ENCOUNTER — Encounter: Payer: Self-pay | Admitting: Family Medicine

## 2017-09-05 ENCOUNTER — Ambulatory Visit: Payer: Managed Care, Other (non HMO) | Admitting: Family Medicine

## 2017-09-05 VITALS — BP 120/70 | HR 96 | Temp 97.6°F | Resp 20 | Ht 67.0 in | Wt 213.1 lb

## 2017-09-05 DIAGNOSIS — J3089 Other allergic rhinitis: Secondary | ICD-10-CM | POA: Diagnosis not present

## 2017-09-05 DIAGNOSIS — E1165 Type 2 diabetes mellitus with hyperglycemia: Secondary | ICD-10-CM | POA: Diagnosis not present

## 2017-09-05 DIAGNOSIS — E1169 Type 2 diabetes mellitus with other specified complication: Secondary | ICD-10-CM | POA: Diagnosis not present

## 2017-09-05 DIAGNOSIS — M1611 Unilateral primary osteoarthritis, right hip: Secondary | ICD-10-CM | POA: Diagnosis not present

## 2017-09-05 DIAGNOSIS — E1129 Type 2 diabetes mellitus with other diabetic kidney complication: Secondary | ICD-10-CM | POA: Diagnosis not present

## 2017-09-05 DIAGNOSIS — J069 Acute upper respiratory infection, unspecified: Secondary | ICD-10-CM

## 2017-09-05 DIAGNOSIS — M109 Gout, unspecified: Secondary | ICD-10-CM | POA: Diagnosis not present

## 2017-09-05 DIAGNOSIS — R809 Proteinuria, unspecified: Secondary | ICD-10-CM | POA: Diagnosis not present

## 2017-09-05 DIAGNOSIS — J029 Acute pharyngitis, unspecified: Secondary | ICD-10-CM | POA: Diagnosis not present

## 2017-09-05 DIAGNOSIS — E785 Hyperlipidemia, unspecified: Secondary | ICD-10-CM

## 2017-09-05 DIAGNOSIS — IMO0002 Reserved for concepts with insufficient information to code with codable children: Secondary | ICD-10-CM

## 2017-09-05 LAB — POCT GLYCOSYLATED HEMOGLOBIN (HGB A1C): Hemoglobin A1C: 7.6

## 2017-09-05 LAB — POCT RAPID STREP A (OFFICE): Rapid Strep A Screen: NEGATIVE

## 2017-09-05 MED ORDER — SEMAGLUTIDE(0.25 OR 0.5MG/DOS) 2 MG/1.5ML ~~LOC~~ SOPN: 0.5000 mg | PEN_INJECTOR | SUBCUTANEOUS | 2 refills | Status: DC

## 2017-09-05 MED ORDER — TRAMADOL HCL 50 MG PO TABS
50.0000 mg | ORAL_TABLET | Freq: Two times a day (BID) | ORAL | 0 refills | Status: DC
Start: 2017-09-05 — End: 2017-12-08

## 2017-09-05 MED ORDER — ALLOPURINOL 300 MG PO TABS: 300.0000 mg | ORAL_TABLET | Freq: Two times a day (BID) | ORAL | 1 refills | Status: DC

## 2017-09-05 MED ORDER — MONTELUKAST SODIUM 10 MG PO TABS: 10.0000 mg | ORAL_TABLET | Freq: Every day | ORAL | 1 refills | Status: DC

## 2017-09-05 MED ORDER — LIDOCAINE VISCOUS 2 % MT SOLN: 15.0000 mL | OROMUCOSAL | 0 refills | Status: DC | PRN

## 2017-09-05 MED ORDER — FLUTICASONE PROPIONATE 50 MCG/ACT NA SUSP: 2.0000 | Freq: Every day | NASAL | 1 refills | Status: DC

## 2017-09-05 MED ORDER — EMPAGLIFLOZIN-METFORMIN HCL ER 25-1000 MG PO TB24: 1.0000 | ORAL_TABLET | Freq: Every day | ORAL | 1 refills | Status: DC

## 2017-09-05 MED ORDER — ATORVASTATIN CALCIUM 20 MG PO TABS: 20.0000 mg | ORAL_TABLET | Freq: Every day | ORAL | 1 refills | Status: DC

## 2017-09-05 MED ORDER — BENZONATATE 100 MG PO CAPS: 100.0000 mg | ORAL_CAPSULE | Freq: Three times a day (TID) | ORAL | 0 refills | Status: DC | PRN

## 2017-09-05 MED ORDER — LEVOCETIRIZINE DIHYDROCHLORIDE 5 MG PO TABS: 5.0000 mg | ORAL_TABLET | Freq: Every evening | ORAL | 1 refills | Status: DC

## 2017-09-05 MED ORDER — OMEGA-3-ACID ETHYL ESTERS 1 G PO CAPS: 2.0000 g | ORAL_CAPSULE | Freq: Two times a day (BID) | ORAL | 1 refills | Status: DC

## 2017-09-05 MED ORDER — HYDROCOD POLST-CPM POLST ER 10-8 MG/5ML PO SUER: 5.0000 mL | Freq: Every evening | ORAL | 0 refills | Status: DC | PRN

## 2017-09-05 NOTE — Progress Notes (Signed)
Name: Maria Cruz   MRN: [id number]    DOB: 02/12/1956   Date:09/05/2017       Progress Note  Subjective  Chief Complaint  Chief Complaint  Patient presents with  . Sore Throat    Onset-Friday, nasal congestion, fever, chills, fatigue, yellow/green mucus, coughing, laryngitis  . Medication Refill  . Diabetes    Needs refill of medication, Checks BS once daily Average-140-150  . Hypertension    Denies any symptoms  . Hyperlipidemia  . Gout  . OA hips    HPI   DM II with proteinuria: urine micro was  100 (was 50 - 11/25/2015)last visit, but normal GFR,  continue ARB/CCB, synjardi and trulicity ( BMWU1L trending up since off Ozempic - we will try PA).  HgbA1C is 7.3% , 7.1% and 6.8% last time,  6.8% up to 7.4% today. She denies polyphagia, polydipsia or polyuria, fsbs is trending up in the 150's-180's fasting.   Hyperlipidemia: Doing well on Lovaza and atorvastatin - deniesmuscle aches. Reviewed labs done back 05/2017  HTN: Taking medication as prescribed, BPis at goal. Denies chest pain or palpations , SOB or dizziness  Gout: no recent episodes, taking allopurinol daily, denies side effects. Last uric acid was slightly low (2.2) on 09/08/2016.  Obesity:she continues to gradually lose weight, she has been eating healthier, more conscious of her diet  OA hips: Pt has not been to see ortho because she had an outstanding bill. She would like to pay bill and then will schedule appointment. Pain is worse on the left side, taking Tylenol 500 mg once a day, advised to go up to three times a day, walking helps right hip pain, she would like a refill of Tramadol, pain still 8/10  Only Tramadol prn.   URI: exposed to granddaughter and also co-worker with colds last week. She worked all weekend with rhinorrhea, cough, no fever or SOB. She is taking otc medication without help. Decrease in appetite because she cannot taste anything, sore throat from coughing so hard.     Patient  Active Problem List   Diagnosis Date Noted  . Microcalcification of left breast on mammogram 05/03/2017  . Herniated intervertebral disc of lumbar spine 09/02/2016  . Spondylolisthesis of lumbar region 09/02/2016  . Controlled type 2 diabetes mellitus with microalbuminuria, without long-term current use of insulin ([place]) 02/24/2015  . Vitamin D deficiency 02/24/2015  . Perennial allergic rhinitis 02/24/2015  . GERD without esophagitis 02/24/2015  . Hypertension, benign 02/24/2015  . Controlled gout 02/24/2015  . Fatty liver 02/24/2015  . Callus of foot 02/24/2015  . Primary osteoarthritis of right hip 02/24/2015  . History of carpal tunnel syndrome 02/24/2015  . Obesity (BMI 30-39.9) 02/24/2015  . Umbilical hernia without obstruction or gangrene 02/24/2015  . Intermittent low back pain 02/24/2015  . History of colonic polyps 02/24/2015  . Dyslipidemia 02/24/2015    Past Surgical History:  Procedure Laterality Date  . ABDOMINAL HYSTERECTOMY  2012  . APPENDECTOMY  2012  . BREAST BIOPSY Right 07/12/2012   Negative  . BREAST BIOPSY Left 05/09/2017   Affirm Bx of two areas- path pending  . COLONOSCOPY  2009   Dr Allen Norris  . COLONOSCOPY WITH PROPOFOL N/A 07/27/2017   Procedure: COLONOSCOPY WITH PROPOFOL;  Surgeon: Robert Bellow, MD;  Location: [facility] ENDOSCOPY;  Service: Endoscopy;  Laterality: N/A;  . DILATION AND CURETTAGE OF UTERUS  2007  . TUBAL LIGATION      Family History  Problem Relation Age of  Onset  . Multiple sclerosis Mother   . Heart attack Father   . Kidney failure Sister   . Heart attack Brother   . Diabetes Daughter   . CAD Sister   . Arthritis Sister        RA  . Lupus Sister   . Breast cancer Neg Hx   . Colon cancer Neg Hx     Social History   Socioeconomic History  . Marital status: Single    Spouse name: Not on file  . Number of children: Not on file  . Years of education: Not on file  . Highest education level: Not on file  Social Needs  .  Financial resource strain: Not on file  . Food insecurity - worry: Not on file  . Food insecurity - inability: Not on file  . Transportation needs - medical: Not on file  . Transportation needs - non-medical: Not on file  Occupational History  . Not on file  Tobacco Use  . Smoking status: Never Smoker  . Smokeless tobacco: Never Used  Substance and Sexual Activity  . Alcohol use: No    Alcohol/week: 0.0 oz  . Drug use: No  . Sexual activity: Yes  Other Topics Concern  . Not on file  Social History Narrative  . Not on file     Current Outpatient Medications:  .  acetaminophen (TYLENOL) 500 MG tablet, Take 1 tablet (500 mg total) by mouth every 6 (six) hours as needed., Disp: 90 tablet, Rfl: 0 .  allopurinol (ZYLOPRIM) 300 MG tablet, Take 1 tablet (300 mg total) by mouth 2 (two) times daily., Disp: 180 tablet, Rfl: 1 .  amLODipine-olmesartan (AZOR) 5-40 MG tablet, Take 1 tablet by mouth daily., Disp: 90 tablet, Rfl: 1 .  aspirin 81 MG chewable tablet, Chew 81 mg by mouth daily., Disp: , Rfl:  .  atorvastatin (LIPITOR) 20 MG tablet, Take 1 tablet (20 mg total) by mouth daily., Disp: 90 tablet, Rfl: 1 .  Coenzyme Q10 (CO Q 10 PO), Take by mouth daily., Disp: , Rfl:  .  Empagliflozin-Metformin HCl ER (SYNJARDY XR) 25-1000 MG TB24, Take 1 tablet by mouth daily. In place of Jardiance and jentaduetoContinue Trulicity, Disp: 90 tablet, Rfl: 1 .  fluticasone (FLONASE) 50 MCG/ACT nasal spray, Place 2 sprays into both nostrils daily., Disp: 48 g, Rfl: 1 .  Glucose Blood (FREESTYLE LITE TEST VI), , Disp: , Rfl:  .  Lancets (FREESTYLE) lancets, Check blood sugars as needed., Disp: 100 each, Rfl: 2 .  levocetirizine (XYZAL) 5 MG tablet, Take 1 tablet (5 mg total) by mouth every evening., Disp: 90 tablet, Rfl: 1 .  montelukast (SINGULAIR) 10 MG tablet, Take 1 tablet (10 mg total) by mouth daily., Disp: 90 tablet, Rfl: 1 .  omega-3 acid ethyl esters (LOVAZA) 1 g capsule, Take 2 capsules (2 g total)  by mouth 2 (two) times daily., Disp: 360 capsule, Rfl: 1 .  traMADol (ULTRAM) 50 MG tablet, Take 1 tablet (50 mg total) by mouth 2 (two) times daily., Disp: 60 tablet, Rfl: 0 .  Vitamin D, Ergocalciferol, 2000 units CAPS, Take by mouth daily., Disp: , Rfl:   Allergies  Allergen Reactions  . Ace Inhibitors Cough  . Lipofen [Fenofibrate] Other (See Comments)    Localized drug reaction, blister on back  . Penicillins Itching  . Sulfa Antibiotics Itching     ROS  Constitutional: Negative for fever or weight change.  Respiratory: Positive  for cough but no  shortness of breath.   Cardiovascular: Negative for chest pain or palpitations.  Gastrointestinal: Negative for abdominal pain, no bowel changes.  Musculoskeletal: Negative for gait problem or joint swelling.  Skin: Negative for rash.  Neurological: Negative for dizziness or headache.  No other specific complaints in a complete review of systems (except as listed in HPI above).  Objective  Vitals:   09/05/17 1155  BP: 120/70  Pulse: 96  Resp: 20  Temp: 97.6 F (36.4 C)  TempSrc: Oral  SpO2: 98%  Weight: 213 lb 1.6 oz (96.7 kg)  Height: 5\' 7"  (1.702 m)    Body mass index is 33.38 kg/m.  Physical Exam  Constitutional: Patient appears well-developed and well-nourished. Obese No distress.  HEENT: head atraumatic, normocephalic, pupils equal and reactive to light, ears : normal TM bilaterally , boggy turbinates, clear rhinorrhea, chapped upper lips,  neck supple, throat within normal limits Cardiovascular: Normal rate, regular rhythm and normal heart sounds.  No murmur heard. No BLE edema. Pulmonary/Chest: Effort normal and breath sounds normal. No respiratory distress. Abdominal: Soft.  There is no tenderness. Psychiatric: Patient has a normal mood and affect. behavior is normal. Judgment and thought content normal.  Recent Results (from the past 2160 hour(s))  Glucose, capillary     Status: Abnormal   Collection Time:  07/27/17  7:27 AM  Result Value Ref Range   Glucose-Capillary 166 (H) 65 - 99 mg/dL  POCT HgB A1C     Status: Abnormal   Collection Time: 09/05/17 11:58 AM  Result Value Ref Range   Hemoglobin A1C 7.6   POCT rapid strep A     Status: Normal   Collection Time: 09/05/17 12:01 PM  Result Value Ref Range   Rapid Strep A Screen Negative Negative      PHQ2/9: Depression screen [facility] 2/9 12/01/2016 09/02/2016 05/26/2016 02/24/2016 11/25/2015  Decreased Interest 0 0 0 0 0  Down, Depressed, Hopeless 0 0 0 0 0  PHQ - 2 Score 0 0 0 0 0     Fall Risk: Fall Risk  09/05/2017 06/02/2017 12/01/2016 09/02/2016 05/26/2016  Falls in the past year? No No No No No     Functional Status Survey: Is the patient deaf or have difficulty hearing?: No Does the patient have difficulty seeing, even when wearing glasses/contacts?: No Does the patient have difficulty concentrating, remembering, or making decisions?: No Does the patient have difficulty walking or climbing stairs?: No Does the patient have difficulty dressing or bathing?: No Does the patient have difficulty doing errands alone such as visiting a doctor's office or shopping?: No   Assessment & Plan  1. Uncontrolled type 2 diabetes mellitus with microalbuminuria, without long-term current use of insulin (HCC)  - POCT HgB A1C - Semaglutide (OZEMPIC) 0.25 or 0.5 MG/DOSE SOPN; Inject 0.5 mg into the skin once a week.  Dispense: 2 pen; Refill: 2  2. Sore throat  - POCT rapid strep A  3. Controlled gout  - allopurinol (ZYLOPRIM) 300 MG tablet; Take 1 tablet (300 mg total) by mouth 2 (two) times daily.  Dispense: 180 tablet; Refill: 1  4. Dyslipidemia associated with type 2 diabetes mellitus (HCC)  - atorvastatin (LIPITOR) 20 MG tablet; Take 1 tablet (20 mg total) by mouth daily.  Dispense: 90 tablet; Refill: 1 - omega-3 acid ethyl esters (LOVAZA) 1 g capsule; Take 2 capsules (2 g total) by mouth 2 (two) times daily.  Dispense: 360 capsule;  Refill: 1 - Semaglutide (OZEMPIC) 0.25 or 0.5 MG/DOSE SOPN;  Inject 0.5 mg into the skin once a week.  Dispense: 2 pen; Refill: 2  5. Dyslipidemia  - atorvastatin (LIPITOR) 20 MG tablet; Take 1 tablet (20 mg total) by mouth daily.  Dispense: 90 tablet; Refill: 1 - omega-3 acid ethyl esters (LOVAZA) 1 g capsule; Take 2 capsules (2 g total) by mouth 2 (two) times daily.  Dispense: 360 capsule; Refill: 1  6. Perennial allergic rhinitis  - montelukast (SINGULAIR) 10 MG tablet; Take 1 tablet (10 mg total) by mouth daily.  Dispense: 90 tablet; Refill: 1 - levocetirizine (XYZAL) 5 MG tablet; Take 1 tablet (5 mg total) by mouth every evening.  Dispense: 90 tablet; Refill: 1 - fluticasone (FLONASE) 50 MCG/ACT nasal spray; Place 2 sprays into both nostrils daily.  Dispense: 48 g; Refill: 1  7. Primary osteoarthritis of right hip  - traMADol (ULTRAM) 50 MG tablet; Take 1 tablet (50 mg total) by mouth 2 (two) times daily.  Dispense: 60 tablet; Refill: 0   8. URI, acute  - chlorpheniramine-HYDROcodone (TUSSIONEX PENNKINETIC ER) 10-8 MG/5ML SUER; Take 5 mLs by mouth at bedtime as needed for cough.  Dispense: 140 mL; Refill: 0 - benzonatate (TESSALON) 100 MG capsule; Take 1-2 capsules (100-200 mg total) by mouth 3 (three) times daily as needed.  Dispense: 40 capsule; Refill: 0

## 2017-11-15 ENCOUNTER — Other Ambulatory Visit: Payer: Self-pay | Admitting: Family Medicine

## 2017-11-15 DIAGNOSIS — E785 Hyperlipidemia, unspecified: Secondary | ICD-10-CM

## 2017-11-15 DIAGNOSIS — E1169 Type 2 diabetes mellitus with other specified complication: Secondary | ICD-10-CM

## 2017-11-15 DIAGNOSIS — I10 Essential (primary) hypertension: Secondary | ICD-10-CM

## 2017-11-15 DIAGNOSIS — J3089 Other allergic rhinitis: Secondary | ICD-10-CM

## 2017-11-15 DIAGNOSIS — N760 Acute vaginitis: Secondary | ICD-10-CM

## 2017-12-08 ENCOUNTER — Encounter: Payer: Self-pay | Admitting: Family Medicine

## 2017-12-08 ENCOUNTER — Ambulatory Visit: Payer: Managed Care, Other (non HMO) | Admitting: Family Medicine

## 2017-12-08 VITALS — BP 120/80 | HR 98 | Resp 14 | Ht 67.0 in | Wt 218.0 lb

## 2017-12-08 DIAGNOSIS — M1611 Unilateral primary osteoarthritis, right hip: Secondary | ICD-10-CM | POA: Diagnosis not present

## 2017-12-08 DIAGNOSIS — M545 Low back pain, unspecified: Secondary | ICD-10-CM

## 2017-12-08 DIAGNOSIS — I10 Essential (primary) hypertension: Secondary | ICD-10-CM | POA: Diagnosis not present

## 2017-12-08 DIAGNOSIS — J3089 Other allergic rhinitis: Secondary | ICD-10-CM

## 2017-12-08 DIAGNOSIS — R809 Proteinuria, unspecified: Secondary | ICD-10-CM

## 2017-12-08 DIAGNOSIS — M109 Gout, unspecified: Secondary | ICD-10-CM

## 2017-12-08 DIAGNOSIS — E1169 Type 2 diabetes mellitus with other specified complication: Secondary | ICD-10-CM

## 2017-12-08 DIAGNOSIS — M79605 Pain in left leg: Secondary | ICD-10-CM

## 2017-12-08 DIAGNOSIS — E785 Hyperlipidemia, unspecified: Secondary | ICD-10-CM

## 2017-12-08 DIAGNOSIS — B373 Candidiasis of vulva and vagina: Secondary | ICD-10-CM | POA: Diagnosis not present

## 2017-12-08 DIAGNOSIS — B3731 Acute candidiasis of vulva and vagina: Secondary | ICD-10-CM

## 2017-12-08 DIAGNOSIS — E1129 Type 2 diabetes mellitus with other diabetic kidney complication: Secondary | ICD-10-CM

## 2017-12-08 MED ORDER — AMLODIPINE-OLMESARTAN 5-40 MG PO TABS: 1.0000 | ORAL_TABLET | Freq: Every day | ORAL | 1 refills | Status: DC

## 2017-12-08 MED ORDER — EMPAGLIFLOZIN-METFORMIN HCL ER 25-1000 MG PO TB24: 1.0000 | ORAL_TABLET | Freq: Every day | ORAL | 1 refills | Status: DC

## 2017-12-08 MED ORDER — TRAMADOL HCL 50 MG PO TABS: 50.0000 mg | ORAL_TABLET | Freq: Two times a day (BID) | ORAL | 0 refills | Status: DC

## 2017-12-08 MED ORDER — OMEGA-3-ACID ETHYL ESTERS 1 G PO CAPS: 2.0000 g | ORAL_CAPSULE | Freq: Two times a day (BID) | ORAL | 1 refills | Status: DC

## 2017-12-08 MED ORDER — MONTELUKAST SODIUM 10 MG PO TABS: 10.0000 mg | ORAL_TABLET | Freq: Every day | ORAL | 1 refills | Status: DC

## 2017-12-08 MED ORDER — ALLOPURINOL 300 MG PO TABS: 300.0000 mg | ORAL_TABLET | Freq: Two times a day (BID) | ORAL | 1 refills | Status: DC

## 2017-12-08 MED ORDER — FLUCONAZOLE 150 MG PO TABS: 150.0000 mg | ORAL_TABLET | ORAL | 0 refills | Status: DC

## 2017-12-08 MED ORDER — SEMAGLUTIDE(0.25 OR 0.5MG/DOS) 2 MG/1.5ML ~~LOC~~ SOPN: 0.5000 mg | PEN_INJECTOR | SUBCUTANEOUS | 2 refills | Status: DC

## 2017-12-08 MED ORDER — ATORVASTATIN CALCIUM 20 MG PO TABS: 20.0000 mg | ORAL_TABLET | Freq: Every day | ORAL | 1 refills | Status: DC

## 2017-12-08 NOTE — Progress Notes (Signed)
Name: Maria Cruz   MRN: [id number]    DOB: [birth year]   Date:12/08/2017       Progress Note  Subjective  Chief Complaint  Chief Complaint  Patient presents with  . Diabetes  . Hypertension  . Hyperlipidemia  . Leg Pain    HPI  DM II with proteinuria: urine micro was  100 (was 50-4/18/2017) 2018  but normal GFR,  continue ARB/CCB, synjardi and trulicity  And DXAJ2I went up, so she has been on Ozempic since 08/2017. HgbA1C is 7.3% , 7.1% and 6.8%, 6.8%up to 7.4% 08/2017, recheck at lab today. She denies polyphagia, polydipsia or polyuria, fsbs is trending up in the 140's-150's since back on Ozempic  Hyperlipidemia: Doing well on Lovaza and atorvastatin - deniesmuscle aches. Reviewed labs done back 05/2017  HTN: Taking medication as prescribed, BPis at goal. Denies chest pain or palpations , SOB or dizziness. Takes medications as prescribed  Gout: no recent episodes, taking allopurinol daily, denies side effects. Last uric acid was slightly low (2.2) on 09/08/2016. Recheck levels  Obesity:she was losing weight, she tries to eat healthy, however had a funeral this past week and has been eating more junk food and sweets.   OA hip/ Sciatica : She states paid off the bill and would like to go back to Ortho, she has history of OA multiple joints, but now is having severe radiculitis down left leg, past 3 weeks, MRI done 2014. Antalgic gait.     Patient Active Problem List   Diagnosis Date Noted  . Microcalcification of left breast on mammogram 05/03/2017  . Herniated intervertebral disc of lumbar spine 09/02/2016  . Spondylolisthesis of lumbar region 09/02/2016  . Controlled type 2 diabetes mellitus with microalbuminuria, without long-term current use of insulin ([place]) 02/24/2015  . Vitamin D deficiency 02/24/2015  . Perennial allergic rhinitis 02/24/2015  . GERD without esophagitis 02/24/2015  . Hypertension, benign 02/24/2015  . Controlled gout 02/24/2015  . Fatty  liver 02/24/2015  . Callus of foot 02/24/2015  . Primary osteoarthritis of right hip 02/24/2015  . History of carpal tunnel syndrome 02/24/2015  . Obesity (BMI 30-39.9) 02/24/2015  . Umbilical hernia without obstruction or gangrene 02/24/2015  . Intermittent low back pain 02/24/2015  . History of colonic polyps 02/24/2015  . Dyslipidemia 02/24/2015    Past Surgical History:  Procedure Laterality Date  . ABDOMINAL HYSTERECTOMY  2012  . APPENDECTOMY  2012  . BREAST BIOPSY Right 07/12/2012   Negative  . BREAST BIOPSY Left 05/09/2017   Affirm Bx of two areas- path pending  . COLONOSCOPY  2009   Dr Allen Norris  . COLONOSCOPY WITH PROPOFOL N/A 07/27/2017   Procedure: COLONOSCOPY WITH PROPOFOL;  Surgeon: Robert Bellow, MD;  Location: [facility] ENDOSCOPY;  Service: Endoscopy;  Laterality: N/A;  . DILATION AND CURETTAGE OF UTERUS  2007  . TUBAL LIGATION      Family History  Problem Relation Age of Onset  . Multiple sclerosis Mother   . Heart attack Father   . Kidney failure Sister   . Heart attack Brother   . Diabetes Daughter   . CAD Sister   . Arthritis Sister        RA  . Lupus Sister   . Breast cancer Neg Hx   . Colon cancer Neg Hx     Social History   Socioeconomic History  . Marital status: Single    Spouse name: Not on file  . Number of children: Not on file  .  Years of education: Not on file  . Highest education level: Not on file  Occupational History  . Not on file  Social Needs  . Financial resource strain: Not on file  . Food insecurity:    Worry: Not on file    Inability: Not on file  . Transportation needs:    Medical: Not on file    Non-medical: Not on file  Tobacco Use  . Smoking status: Never Smoker  . Smokeless tobacco: Never Used  Substance and Sexual Activity  . Alcohol use: No    Alcohol/week: 0.0 oz  . Drug use: No  . Sexual activity: Yes  Lifestyle  . Physical activity:    Days per week: Not on file    Minutes per session: Not on file  .  Stress: Not on file  Relationships  . Social connections:    Talks on phone: Not on file    Gets together: Not on file    Attends religious service: Not on file    Active member of club or organization: Not on file    Attends meetings of clubs or organizations: Not on file    Relationship status: Not on file  . Intimate partner violence:    Fear of current or ex partner: Not on file    Emotionally abused: Not on file    Physically abused: Not on file    Forced sexual activity: Not on file  Other Topics Concern  . Not on file  Social History Narrative  . Not on file     Current Outpatient Medications:  .  acetaminophen (TYLENOL) 500 MG tablet, Take 1 tablet (500 mg total) by mouth every 6 (six) hours as needed., Disp: 90 tablet, Rfl: 0 .  allopurinol (ZYLOPRIM) 300 MG tablet, Take 1 tablet (300 mg total) by mouth 2 (two) times daily., Disp: 180 tablet, Rfl: 1 .  amLODipine-olmesartan (AZOR) 5-40 MG tablet, Take 1 tablet by mouth daily., Disp: 90 tablet, Rfl: 1 .  aspirin 81 MG chewable tablet, Chew 81 mg by mouth daily., Disp: , Rfl:  .  atorvastatin (LIPITOR) 20 MG tablet, Take 1 tablet (20 mg total) by mouth daily., Disp: 90 tablet, Rfl: 1 .  Coenzyme Q10 (CO Q 10 PO), Take by mouth daily., Disp: , Rfl:  .  Empagliflozin-metFORMIN HCl ER (SYNJARDY XR) 25-1000 MG TB24, Take 1 tablet by mouth daily. In place of Jardiance and jentaduetoContinue Trulicity, Disp: 90 tablet, Rfl: 1 .  fluticasone (FLONASE) 50 MCG/ACT nasal spray, Place 2 sprays into both nostrils daily., Disp: 48 g, Rfl: 1 .  Glucose Blood (FREESTYLE LITE TEST VI), , Disp: , Rfl:  .  Lancets (FREESTYLE) lancets, Check blood sugars as needed., Disp: 100 each, Rfl: 2 .  levocetirizine (XYZAL) 5 MG tablet, Take 1 tablet (5 mg total) by mouth every evening., Disp: 90 tablet, Rfl: 1 .  montelukast (SINGULAIR) 10 MG tablet, Take 1 tablet (10 mg total) by mouth daily., Disp: 90 tablet, Rfl: 1 .  omega-3 acid ethyl esters  (LOVAZA) 1 g capsule, Take 2 capsules (2 g total) by mouth 2 (two) times daily., Disp: 360 capsule, Rfl: 1 .  Semaglutide (OZEMPIC) 0.25 or 0.5 MG/DOSE SOPN, Inject 0.5 mg into the skin once a week., Disp: 2 pen, Rfl: 2 .  traMADol (ULTRAM) 50 MG tablet, Take 1 tablet (50 mg total) by mouth 2 (two) times daily., Disp: 60 tablet, Rfl: 0 .  Vitamin D, Ergocalciferol, 2000 units CAPS, Take by mouth  daily., Disp: , Rfl:  .  fluconazole (DIFLUCAN) 150 MG tablet, Take 1 tablet (150 mg total) by mouth every other day., Disp: 3 tablet, Rfl: 0 .  lidocaine (XYLOCAINE) 2 % solution, Use as directed 15 mLs in the mouth or throat every 4 (four) hours as needed for mouth pain. Swish and spit (Patient not taking: Reported on 12/08/2017), Disp: 200 mL, Rfl: 0  Allergies  Allergen Reactions  . Ace Inhibitors Cough  . Lipofen [Fenofibrate] Other (See Comments)    Localized drug reaction, blister on back  . Penicillins Itching  . Sulfa Antibiotics Itching     ROS  Constitutional: Negative for fever , positive for mild weight change.  Respiratory: Negative for cough and shortness of breath.   Cardiovascular: Negative for chest pain or palpitations.  Gastrointestinal: Negative for abdominal pain, no bowel changes.  Musculoskeletal: Positive  for gait problem and intermittent left knee joint swelling.  Skin: Negative for rash.  Neurological: Negative for dizziness or headache.  No other specific complaints in a complete review of systems (except as listed in HPI above).  Objective  Vitals:   12/08/17 1029  BP: 120/80  Pulse: 98  Resp: 14  SpO2: 99%  Weight: 218 lb (98.9 kg)  Height: 5\' 7"  (1.702 m)    Body mass index is 34.14 kg/m.  Physical Exam  Constitutional: Patient appears well-developed and well-nourished. Obese No distress.  HEENT: head atraumatic, normocephalic, pupils equal and reactive to light, e neck supple, throat within normal limits Cardiovascular: Normal rate, regular rhythm  and normal heart sounds.  No murmur heard. No BLE edema. Pulmonary/Chest: Effort normal and breath sounds normal. No respiratory distress. Abdominal: Soft.  There is no tenderness. Psychiatric: Patient has a normal mood and affect. behavior is normal. Judgment and thought content normal. Muscular skeletal: keeping left leg straight while sitting because of pain, pain during palpation of sciatic notch, paresthesia left lateral thigh  PHQ2/9: Depression screen [facility] 2/9 12/08/2017 12/01/2016 09/02/2016 05/26/2016 02/24/2016  Decreased Interest 0 0 0 0 0  Down, Depressed, Hopeless 0 0 0 0 0  PHQ - 2 Score 0 0 0 0 0    Fall Risk: Fall Risk  12/08/2017 09/05/2017 06/02/2017 12/01/2016 09/02/2016  Falls in the past year? No No No No No    Functional Status Survey: Is the patient deaf or have difficulty hearing?: No Does the patient have difficulty seeing, even when wearing glasses/contacts?: No Does the patient have difficulty concentrating, remembering, or making decisions?: No Does the patient have difficulty walking or climbing stairs?: No Does the patient have difficulty dressing or bathing?: No Does the patient have difficulty doing errands alone such as visiting a doctor's office or shopping?: No   Assessment & Plan  1. Controlled type 2 diabetes mellitus with microalbuminuria, without long-term current use of insulin (HCC)  - POCT HgB A1C - Semaglutide (OZEMPIC) 0.25 or 0.5 MG/DOSE SOPN; Inject 0.5 mg into the skin once a week.  Dispense: 2 pen; Refill: 2 - Hemoglobin A1c - Comprehensive metabolic panel - Urine Microalbumin w/creat. ratio  2. Controlled gout  - allopurinol (ZYLOPRIM) 300 MG tablet; Take 1 tablet (300 mg total) by mouth 2 (two) times daily.  Dispense: 180 tablet; Refill: 1 - Uric acid  3. Hypertension, benign  - amLODipine-olmesartan (AZOR) 5-40 MG tablet; Take 1 tablet by mouth daily.  Dispense: 90 tablet; Refill: 1 - CBC with Differential/Platelet  4.  Dyslipidemia associated with type 2 diabetes mellitus (HCC)  - atorvastatin (  LIPITOR) 20 MG tablet; Take 1 tablet (20 mg total) by mouth daily.  Dispense: 90 tablet; Refill: 1 - omega-3 acid ethyl esters (LOVAZA) 1 g capsule; Take 2 capsules (2 g total) by mouth 2 (two) times daily.  Dispense: 360 capsule; Refill: 1 - Semaglutide (OZEMPIC) 0.25 or 0.5 MG/DOSE SOPN; Inject 0.5 mg into the skin once a week.  Dispense: 2 pen; Refill: 2 - Lipid panel  5. Dyslipidemia  - atorvastatin (LIPITOR) 20 MG tablet; Take 1 tablet (20 mg total) by mouth daily.  Dispense: 90 tablet; Refill: 1 - omega-3 acid ethyl esters (LOVAZA) 1 g capsule; Take 2 capsules (2 g total) by mouth 2 (two) times daily.  Dispense: 360 capsule; Refill: 1  6. Perennial allergic rhinitis  - montelukast (SINGULAIR) 10 MG tablet; Take 1 tablet (10 mg total) by mouth daily.  Dispense: 90 tablet; Refill: 1  7. Primary osteoarthritis of right hip  - traMADol (ULTRAM) 50 MG tablet; Take 1 tablet (50 mg total) by mouth 2 (two) times daily.  Dispense: 60 tablet; Refill: 0  8. Low back pain radiating to left lower extremity  - Ambulatory referral to Neurosurgery - traMADol (ULTRAM) 50 MG tablet; Take 1 tablet (50 mg total) by mouth 2 (two) times daily.  Dispense: 60 tablet; Refill: 0  9. Yeast vaginitis  - fluconazole (DIFLUCAN) 150 MG tablet; Take 1 tablet (150 mg total) by mouth every other day.  Dispense: 3 tablet; Refill: 0

## 2017-12-09 LAB — MICROALBUMIN / CREATININE URINE RATIO
Creatinine, Urine: 66.9 mg/dL
MICROALB/CREAT RATIO: 394 mg/g{creat} — AB (ref 0.0–30.0)
Microalbumin, Urine: 263.6 ug/mL

## 2017-12-09 LAB — CBC WITH DIFFERENTIAL/PLATELET
BASOS ABS: 0 10*3/uL (ref 0.0–0.2)
Basos: 0 %
EOS (ABSOLUTE): 0.1 10*3/uL (ref 0.0–0.4)
Eos: 2 %
Hematocrit: 41.7 % (ref 34.0–46.6)
Hemoglobin: 14.2 g/dL (ref 11.1–15.9)
IMMATURE GRANS (ABS): 0 10*3/uL (ref 0.0–0.1)
IMMATURE GRANULOCYTES: 0 %
LYMPHS: 50 %
Lymphocytes Absolute: 3.1 10*3/uL (ref 0.7–3.1)
MCH: 31 pg (ref 26.6–33.0)
MCHC: 34.1 g/dL (ref 31.5–35.7)
MCV: 91 fL (ref 79–97)
MONOCYTES: 6 %
Monocytes Absolute: 0.4 10*3/uL (ref 0.1–0.9)
NEUTROS PCT: 42 %
Neutrophils Absolute: 2.6 10*3/uL (ref 1.4–7.0)
PLATELETS: 276 10*3/uL (ref 150–379)
RBC: 4.58 x10E6/uL (ref 3.77–5.28)
RDW: 14.1 % (ref 12.3–15.4)
WBC: 6.2 10*3/uL (ref 3.4–10.8)

## 2017-12-09 LAB — COMPREHENSIVE METABOLIC PANEL
ALT: 40 IU/L — AB (ref 0–32)
AST: 60 IU/L — ABNORMAL HIGH (ref 0–40)
Albumin/Globulin Ratio: 1.6 (ref 1.2–2.2)
Albumin: 4.7 g/dL (ref 3.6–4.8)
Alkaline Phosphatase: 94 IU/L (ref 39–117)
BILIRUBIN TOTAL: 0.4 mg/dL (ref 0.0–1.2)
BUN/Creatinine Ratio: 20 (ref 12–28)
BUN: 12 mg/dL (ref 8–27)
CALCIUM: 10.5 mg/dL — AB (ref 8.7–10.3)
CHLORIDE: 102 mmol/L (ref 96–106)
CO2: 26 mmol/L (ref 20–29)
CREATININE: 0.61 mg/dL (ref 0.57–1.00)
GFR calc Af Amer: 114 mL/min/{1.73_m2} (ref >59)
GFR calc non Af Amer: 99 mL/min/{1.73_m2} (ref >59)
GLUCOSE: 110 mg/dL — AB (ref 65–99)
Globulin, Total: 2.9 g/dL (ref 1.5–4.5)
Potassium: 4.6 mmol/L (ref 3.5–5.2)
Sodium: 144 mmol/L (ref 134–144)
Total Protein: 7.6 g/dL (ref 6.0–8.5)

## 2017-12-09 LAB — URIC ACID: URIC ACID: 3.4 mg/dL (ref 2.5–7.1)

## 2017-12-09 LAB — LIPID PANEL
CHOL/HDL RATIO: 4.1 ratio (ref 0.0–4.4)
Cholesterol, Total: 195 mg/dL (ref 100–199)
HDL: 48 mg/dL (ref >39)
LDL Calculated: 80 mg/dL (ref 0–99)
Triglycerides: 337 mg/dL — ABNORMAL HIGH (ref 0–149)
VLDL Cholesterol Cal: 67 mg/dL — ABNORMAL HIGH (ref 5–40)

## 2017-12-09 LAB — HEMOGLOBIN A1C
Est. average glucose Bld gHb Est-mCnc: 160 mg/dL
HEMOGLOBIN A1C: 7.2 % — AB (ref 4.8–5.6)

## 2017-12-12 ENCOUNTER — Encounter: Payer: Self-pay | Admitting: Family Medicine

## 2017-12-12 ENCOUNTER — Other Ambulatory Visit: Payer: Self-pay | Admitting: Family Medicine

## 2017-12-12 DIAGNOSIS — E1169 Type 2 diabetes mellitus with other specified complication: Secondary | ICD-10-CM

## 2017-12-12 DIAGNOSIS — E785 Hyperlipidemia, unspecified: Secondary | ICD-10-CM

## 2017-12-12 MED ORDER — ATORVASTATIN CALCIUM 40 MG PO TABS: 40.0000 mg | ORAL_TABLET | Freq: Every day | ORAL | 1 refills | Status: DC

## 2018-02-02 ENCOUNTER — Other Ambulatory Visit: Payer: Self-pay | Admitting: Student

## 2018-02-02 DIAGNOSIS — M5416 Radiculopathy, lumbar region: Secondary | ICD-10-CM

## 2018-02-21 ENCOUNTER — Other Ambulatory Visit: Payer: Self-pay | Admitting: Family Medicine

## 2018-02-21 DIAGNOSIS — E1169 Type 2 diabetes mellitus with other specified complication: Secondary | ICD-10-CM

## 2018-02-21 DIAGNOSIS — E785 Hyperlipidemia, unspecified: Secondary | ICD-10-CM

## 2018-02-21 DIAGNOSIS — M109 Gout, unspecified: Secondary | ICD-10-CM

## 2018-02-22 ENCOUNTER — Ambulatory Visit
Admission: RE | Admit: 2018-02-22 | Discharge: 2018-02-22 | Disposition: A | Discharge status: Home / Self Care | Payer: Managed Care, Other (non HMO) | Source: Ambulatory Visit | Attending: Student | Admitting: Student

## 2018-02-22 DIAGNOSIS — M5416 Radiculopathy, lumbar region: Secondary | ICD-10-CM

## 2018-02-22 DIAGNOSIS — M4726 Other spondylosis with radiculopathy, lumbar region: Secondary | ICD-10-CM | POA: Diagnosis not present

## 2018-02-26 ENCOUNTER — Other Ambulatory Visit: Payer: Self-pay | Admitting: Family Medicine

## 2018-02-26 DIAGNOSIS — I10 Essential (primary) hypertension: Secondary | ICD-10-CM

## 2018-02-26 DIAGNOSIS — J3089 Other allergic rhinitis: Secondary | ICD-10-CM

## 2018-03-14 ENCOUNTER — Encounter: Payer: Self-pay | Admitting: Family Medicine

## 2018-03-14 ENCOUNTER — Ambulatory Visit: Payer: Managed Care, Other (non HMO) | Admitting: Family Medicine

## 2018-03-14 ENCOUNTER — Ambulatory Visit
Admission: RE | Admit: 2018-03-14 | Discharge: 2018-03-14 | Disposition: A | Discharge status: Home / Self Care | Payer: Managed Care, Other (non HMO) | Source: Ambulatory Visit | Attending: Family Medicine | Admitting: Family Medicine

## 2018-03-14 VITALS — BP 122/62 | HR 102 | Temp 97.8°F | Resp 18 | Ht 67.0 in | Wt 220.9 lb

## 2018-03-14 DIAGNOSIS — E559 Vitamin D deficiency, unspecified: Secondary | ICD-10-CM

## 2018-03-14 DIAGNOSIS — R1012 Left upper quadrant pain: Secondary | ICD-10-CM | POA: Insufficient documentation

## 2018-03-14 DIAGNOSIS — R8290 Unspecified abnormal findings in urine: Secondary | ICD-10-CM

## 2018-03-14 DIAGNOSIS — E785 Hyperlipidemia, unspecified: Secondary | ICD-10-CM | POA: Diagnosis not present

## 2018-03-14 DIAGNOSIS — R350 Frequency of micturition: Secondary | ICD-10-CM | POA: Diagnosis not present

## 2018-03-14 DIAGNOSIS — L750 Bromhidrosis: Secondary | ICD-10-CM | POA: Diagnosis not present

## 2018-03-14 DIAGNOSIS — E1169 Type 2 diabetes mellitus with other specified complication: Secondary | ICD-10-CM | POA: Diagnosis not present

## 2018-03-14 DIAGNOSIS — I10 Essential (primary) hypertension: Secondary | ICD-10-CM

## 2018-03-14 DIAGNOSIS — R809 Proteinuria, unspecified: Secondary | ICD-10-CM | POA: Diagnosis not present

## 2018-03-14 DIAGNOSIS — M5126 Other intervertebral disc displacement, lumbar region: Secondary | ICD-10-CM

## 2018-03-14 DIAGNOSIS — E1129 Type 2 diabetes mellitus with other diabetic kidney complication: Secondary | ICD-10-CM

## 2018-03-14 LAB — POCT URINALYSIS DIPSTICK
BILIRUBIN UA: NEGATIVE
GLUCOSE UA: POSITIVE — AB
KETONES UA: NEGATIVE
Leukocytes, UA: NEGATIVE
Nitrite, UA: NEGATIVE
PH UA: 5 (ref 5.0–8.0)
Protein, UA: POSITIVE — AB
Spec Grav, UA: 1.02 (ref 1.010–1.025)
UROBILINOGEN UA: 0.2 U/dL

## 2018-03-14 LAB — POCT GLYCOSYLATED HEMOGLOBIN (HGB A1C): HbA1c, POC (controlled diabetic range): 7.9 % — AB (ref 0.0–7.0)

## 2018-03-14 MED ORDER — AMLODIPINE-OLMESARTAN 5-40 MG PO TABS: 1.0000 | ORAL_TABLET | Freq: Every day | ORAL | 0 refills | Status: DC

## 2018-03-14 MED ORDER — GLUCOSE BLOOD VI STRP: ORAL_STRIP | 12 refills | Status: DC

## 2018-03-14 MED ORDER — PREGABALIN 150 MG PO CAPS: 150.0000 mg | ORAL_CAPSULE | Freq: Two times a day (BID) | ORAL | 0 refills | Status: DC

## 2018-03-14 MED ORDER — FREESTYLE LANCETS MISC: 2 refills | Status: DC

## 2018-03-14 MED ORDER — SEMAGLUTIDE (1 MG/DOSE) 2 MG/1.5ML ~~LOC~~ SOPN: 1.0000 mg | PEN_INJECTOR | SUBCUTANEOUS | 1 refills | Status: DC

## 2018-03-14 NOTE — Progress Notes (Signed)
Name: Maria Cruz   MRN: [id number]    DOB: August 09, 1956   Date:03/14/2018       Progress Note  Subjective  Chief Complaint  Chief Complaint  Patient presents with  . Medication Refill    3 month F/U- Needs Refill  . Diabetes    3x time weekly, Average-123 to 140.   Marland Kitchen Hyperlipidemia  . Hypertension    Denies any symptoms  . Gout    uncontrolled  . Obesity  . Osteoarthritis  . Urinary Retention    Onset-4 months, urinary odor  . Allergic Rhinitis     HPI   DM II with proteinuria: urine microelevated 12/2017, normal GFR,continue ARB/CCB, synjardi and Ozempic and  hgbA1C is still going up and also her weight, she stopped walking and will resume physical activity.  She denies polyphagia, polydipsia or polyuria, fsbs is 120's, and is higher at night.   Hyperlipidemia: Doing well on Lovaza and atorvastatin - deniesmuscle aches.Recheck labs today   HTN: Taking medication as prescribed, BPis at goal. Denies chest pain or palpations ,SOB or dizziness. Unchanged   Gout: no recent episodes, taking allopurinol daily, denies side effects. Last uric acid was slightly low (2.2) on 09/08/2016. Last uric acid was at goal   Obesity:she was losing weight, she tries to eat healthy,but weight is going up.   OA hip/ Sciatica : MRI done 2014 and repeat 2019 . Antalgic gait. Seeing Ortho, had MMR that showed DDD lumbar spine , thinking about steroid injections, explained it will raise glucose, we will try Lyrica She has pain going down left leg  Left upper quadrant pain: described as cramping, intermittent, sometimes is like numbness goes from back to LUQ, no nausea or vomiting, no change in appetite , not related to meals. Going for a few months, bowel movements are regular bristol of 4, used to take gabapentin but makes her feel sleepy we will try lyrica.   Urine odor: she is worried about possible infection     Patient Active Problem List   Diagnosis Date Noted  .  Microcalcification of left breast on mammogram 05/03/2017  . Herniated intervertebral disc of lumbar spine 09/02/2016  . Spondylolisthesis of lumbar region 09/02/2016  . Controlled type 2 diabetes mellitus with microalbuminuria, without long-term current use of insulin ([place]) 02/24/2015  . Vitamin D deficiency 02/24/2015  . Perennial allergic rhinitis 02/24/2015  . Hypercalcemia 02/24/2015  . GERD without esophagitis 02/24/2015  . Hypertension, benign 02/24/2015  . Controlled gout 02/24/2015  . Fatty liver 02/24/2015  . Callus of foot 02/24/2015  . Primary osteoarthritis of right hip 02/24/2015  . History of carpal tunnel syndrome 02/24/2015  . Obesity (BMI 30-39.9) 02/24/2015  . Umbilical hernia without obstruction or gangrene 02/24/2015  . Intermittent low back pain 02/24/2015  . History of colonic polyps 02/24/2015  . Dyslipidemia 02/24/2015    Past Surgical History:  Procedure Laterality Date  . ABDOMINAL HYSTERECTOMY  2012  . APPENDECTOMY  2012  . BREAST BIOPSY Right 07/12/2012   Negative  . BREAST BIOPSY Left 05/09/2017   Affirm Bx of two areas- path pending  . COLONOSCOPY  2009   Dr Allen Norris  . COLONOSCOPY WITH PROPOFOL N/A 07/27/2017   Procedure: COLONOSCOPY WITH PROPOFOL;  Surgeon: Robert Bellow, MD;  Location: [facility] ENDOSCOPY;  Service: Endoscopy;  Laterality: N/A;  . DILATION AND CURETTAGE OF UTERUS  2007  . TUBAL LIGATION      Family History  Problem Relation Age of Onset  .  Multiple sclerosis Mother   . Heart attack Father   . Kidney failure Sister   . Heart attack Brother   . Diabetes Daughter   . CAD Sister   . Arthritis Sister        RA  . Lupus Sister   . Breast cancer Neg Hx   . Colon cancer Neg Hx     Social History   Socioeconomic History  . Marital status: Single    Spouse name: Not on file  . Number of children: Not on file  . Years of education: Not on file  . Highest education level: Not on file  Occupational History  . Not on file   Social Needs  . Financial resource strain: Not on file  . Food insecurity:    Worry: Not on file    Inability: Not on file  . Transportation needs:    Medical: Not on file    Non-medical: Not on file  Tobacco Use  . Smoking status: Never Smoker  . Smokeless tobacco: Never Used  Substance and Sexual Activity  . Alcohol use: No    Alcohol/week: 0.0 oz  . Drug use: No  . Sexual activity: Yes  Lifestyle  . Physical activity:    Days per week: Not on file    Minutes per session: Not on file  . Stress: Not on file  Relationships  . Social connections:    Talks on phone: Not on file    Gets together: Not on file    Attends religious service: Not on file    Active member of club or organization: Not on file    Attends meetings of clubs or organizations: Not on file    Relationship status: Not on file  . Intimate partner violence:    Fear of current or ex partner: Not on file    Emotionally abused: Not on file    Physically abused: Not on file    Forced sexual activity: Not on file  Other Topics Concern  . Not on file  Social History Narrative  . Not on file     Current Outpatient Medications:  .  acetaminophen (TYLENOL) 500 MG tablet, Take 1 tablet (500 mg total) by mouth every 6 (six) hours as needed., Disp: 90 tablet, Rfl: 0 .  allopurinol (ZYLOPRIM) 300 MG tablet, Take 1 tablet (300 mg total) by mouth 2 (two) times daily., Disp: 180 tablet, Rfl: 1 .  amLODipine-olmesartan (AZOR) 5-40 MG tablet, Take 1 tablet by mouth daily., Disp: 90 tablet, Rfl: 0 .  aspirin 81 MG chewable tablet, Chew 81 mg by mouth daily., Disp: , Rfl:  .  atorvastatin (LIPITOR) 40 MG tablet, Take 1 tablet (40 mg total) by mouth daily., Disp: 90 tablet, Rfl: 1 .  Cholecalciferol (VITAMIN D-1000 MAX ST) 1000 units tablet, Take by mouth., Disp: , Rfl:  .  Coenzyme Q10 (CO Q 10 PO), Take by mouth daily., Disp: , Rfl:  .  Empagliflozin-metFORMIN HCl ER (SYNJARDY XR) 25-1000 MG TB24, Take 1 tablet by mouth  daily. In place of Jardiance and jentaduetoContinue Trulicity, Disp: 90 tablet, Rfl: 1 .  fluconazole (DIFLUCAN) 150 MG tablet, Take 1 tablet (150 mg total) by mouth every other day., Disp: 3 tablet, Rfl: 0 .  fluticasone (FLONASE) 50 MCG/ACT nasal spray, SHAKE LIQUID AND USE 2 SPRAYS IN EACH NOSTRIL DAILY, Disp: 48 g, Rfl: 0 .  Lancets (FREESTYLE) lancets, Check blood sugars as needed., Disp: 100 each, Rfl: 2 .  levocetirizine (  XYZAL) 5 MG tablet, Take 1 tablet (5 mg total) by mouth every evening., Disp: 90 tablet, Rfl: 1 .  montelukast (SINGULAIR) 10 MG tablet, Take 1 tablet (10 mg total) by mouth daily., Disp: 90 tablet, Rfl: 1 .  omega-3 acid ethyl esters (LOVAZA) 1 g capsule, Take 2 capsules (2 g total) by mouth 2 (two) times daily., Disp: 360 capsule, Rfl: 1 .  traMADol (ULTRAM) 50 MG tablet, Take 1 tablet (50 mg total) by mouth 2 (two) times daily., Disp: 60 tablet, Rfl: 0 .  glucose blood (FREESTYLE LITE) test strip, Use as instructed, Disp: 100 each, Rfl: 12 .  lidocaine (XYLOCAINE) 2 % solution, Use as directed 15 mLs in the mouth or throat every 4 (four) hours as needed for mouth pain. Swish and spit (Patient not taking: Reported on 03/14/2018), Disp: 200 mL, Rfl: 0 .  pregabalin (LYRICA) 150 MG capsule, Take 1 capsule (150 mg total) by mouth 2 (two) times daily., Disp: 180 capsule, Rfl: 0 .  Semaglutide (OZEMPIC) 1 MG/DOSE SOPN, Inject 1 mg into the skin once a week., Disp: 9 mL, Rfl: 1 .  Vitamin D, Ergocalciferol, 2000 units CAPS, Take by mouth daily., Disp: , Rfl:   Allergies  Allergen Reactions  . Ace Inhibitors Cough  . Lipofen [Fenofibrate] Other (See Comments)    Localized drug reaction, blister on back  . Penicillins Itching  . Sulfa Antibiotics Itching     ROS   Constitutional: Negative for fever , positive for  weight change.  Respiratory: Negative for cough and shortness of breath.   Cardiovascular: Negative for chest pain or palpitations.  Gastrointestinal: Negative  for abdominal pain, no bowel changes.  Musculoskeletal: Positive  for gait problem but no  joint swelling.  Skin: Negative for rash.  Neurological: Negative for dizziness or headache.  No other specific complaints in a complete review of systems (except as listed in HPI above).  Objective  Vitals:   03/14/18 0900  BP: 122/62  Pulse: (!) 102  Resp: 18  Temp: 97.8 F (36.6 C)  TempSrc: Oral  SpO2: 97%  Weight: 220 lb 14.4 oz (100.2 kg)  Height: '5\' 7"'$  (1.702 m)    Body mass index is 34.6 kg/m.  Physical Exam  Constitutional: Patient appears well-developed and well-nourished. Obese No distress.  HEENT: head atraumatic, normocephalic, pupils equal and reactive to light, neck supple, throat within normal limits Cardiovascular: Normal rate, regular rhythm and normal heart sounds.  No murmur heard. No BLE edema. Pulmonary/Chest: Effort normal and breath sounds normal. No respiratory distress. Abdominal: Soft.  There is  Tenderness left upper quadrant, no pain during palpation of thoracic spine Muscular skeletal: no pain during palpation of lumbar spine, positive for  straight leg raise left side Psychiatric: Patient has a normal mood and affect. behavior is normal. Judgment and thought content normal.  Recent Results (from the past 2160 hour(s))  POCT HgB A1C     Status: Abnormal   Collection Time: 03/14/18  9:14 AM  Result Value Ref Range   Hemoglobin A1C  4.0 - 5.6 %   HbA1c POC (<> result, manual entry)  4.0 - 5.6 %   HbA1c, POC (prediabetic range)  5.7 - 6.4 %   HbA1c, POC (controlled diabetic range) 7.9 (A) 0.0 - 7.0 %  POCT urinalysis dipstick     Status: Abnormal   Collection Time: 03/14/18  9:15 AM  Result Value Ref Range   Color, UA dark yellow    Clarity, UA cloudy  Glucose, UA Positive (A) Negative    Comment: 2000 or more   Bilirubin, UA neg    Ketones, UA neg    Spec Grav, UA 1.020 1.010 - 1.025   Blood, UA small    pH, UA 5.0 5.0 - 8.0   Protein, UA  Positive (A) Negative    Comment: +++-2   Urobilinogen, UA 0.2 0.2 or 1.0 E.U./dL   Nitrite, UA neg    Leukocytes, UA Negative Negative   Appearance     Odor      Diabetic Foot Exam: Diabetic Foot Exam - Simple   Simple Foot Form Diabetic Foot exam was performed with the following findings:  Yes 03/14/2018  9:47 AM  Visual Inspection No deformities, no ulcerations, no other skin breakdown bilaterally:  Yes Sensation Testing Intact to touch and monofilament testing bilaterally:  Yes Pulse Check Posterior Tibialis and Dorsalis pulse intact bilaterally:  Yes Comments      PHQ2/9: Depression screen [facility] 2/9 03/14/2018 12/08/2017 12/01/2016 09/02/2016 05/26/2016  Decreased Interest 0 0 0 0 0  Down, Depressed, Hopeless 0 0 0 0 0  PHQ - 2 Score 0 0 0 0 0    Fall Risk: Fall Risk  03/14/2018 12/08/2017 09/05/2017 06/02/2017 12/01/2016  Falls in the past year? No No No No No      Functional Status Survey: Is the patient deaf or have difficulty hearing?: No Does the patient have difficulty seeing, even when wearing glasses/contacts?: No Does the patient have difficulty concentrating, remembering, or making decisions?: No Does the patient have difficulty walking or climbing stairs?: No Does the patient have difficulty dressing or bathing?: No Does the patient have difficulty doing errands alone such as visiting a doctor's office or shopping?: No    Assessment & Plan  1. Dyslipidemia associated with type 2 diabetes mellitus (HCC)  - POCT HgB A1C - Semaglutide (OZEMPIC) 1 MG/DOSE SOPN; Inject 1 mg into the skin once a week.  Dispense: 9 mL; Refill: 1 - COMPLETE METABOLIC PANEL WITH GFR - Lipid panel  2. Controlled type 2 diabetes mellitus with microalbuminuria, without long-term current use of insulin (HCC)  - POCT HgB A1C - Semaglutide (OZEMPIC) 1 MG/DOSE SOPN; Inject 1 mg into the skin once a week.  Dispense: 9 mL; Refill: 1 - Lancets (FREESTYLE) lancets; Check blood sugars as  needed.  Dispense: 100 each; Refill: 2  3. Urinary body odor  - POCT urinalysis dipstick  4. Urinary frequency  - POCT urinalysis dipstick  5. Hypertension, benign  - amLODipine-olmesartan (AZOR) 5-40 MG tablet; Take 1 tablet by mouth daily.  Dispense: 90 tablet; Refill: 0 - Comprehensive metabolic panel  6. Herniated intervertebral disc of lumbar spine  - pregabalin (LYRICA) 150 MG capsule; Take 1 capsule (150 mg total) by mouth 2 (two) times daily.  Dispense: 180 capsule; Refill: 0  7. Left upper quadrant pain  - COMPLETE METABOLIC PANEL WITH GFR - CBC with Differential/Platelet - Lipase - DG Abd 2 Views; Future  8. Vitamin D deficiency  - VITAMIN D 25 Hydroxy (Vit-D Deficiency, Fractures)  9. Dyslipidemia   10. Abnormal urine odor  - CULTURE, URINE COMPREHENSIVE

## 2018-03-15 LAB — VITAMIN D 25 HYDROXY (VIT D DEFICIENCY, FRACTURES): Vit D, 25-Hydroxy: 39 ng/mL (ref 30.0–100.0)

## 2018-03-15 LAB — CBC WITH DIFFERENTIAL/PLATELET
BASOS ABS: 0 10*3/uL (ref 0.0–0.2)
Basos: 0 %
EOS (ABSOLUTE): 0.1 10*3/uL (ref 0.0–0.4)
Eos: 2 %
Hematocrit: 38.9 % (ref 34.0–46.6)
Hemoglobin: 13.6 g/dL (ref 11.1–15.9)
IMMATURE GRANS (ABS): 0 10*3/uL (ref 0.0–0.1)
IMMATURE GRANULOCYTES: 0 %
Lymphocytes Absolute: 2.3 10*3/uL (ref 0.7–3.1)
Lymphs: 43 %
MCH: 32.1 pg (ref 26.6–33.0)
MCHC: 35 g/dL (ref 31.5–35.7)
MCV: 92 fL (ref 79–97)
Monocytes Absolute: 0.4 10*3/uL (ref 0.1–0.9)
Monocytes: 7 %
Neutrophils Absolute: 2.5 10*3/uL (ref 1.4–7.0)
Neutrophils: 48 %
PLATELETS: 273 10*3/uL (ref 150–450)
RBC: 4.24 x10E6/uL (ref 3.77–5.28)
RDW: 13.7 % (ref 12.3–15.4)
WBC: 5.3 10*3/uL (ref 3.4–10.8)

## 2018-03-15 LAB — COMPREHENSIVE METABOLIC PANEL
ALT: 37 IU/L — AB (ref 0–32)
AST: 62 IU/L — AB (ref 0–40)
Albumin/Globulin Ratio: 1.7 (ref 1.2–2.2)
Albumin: 4.7 g/dL (ref 3.6–4.8)
Alkaline Phosphatase: 106 IU/L (ref 39–117)
BUN/Creatinine Ratio: 22 (ref 12–28)
BUN: 12 mg/dL (ref 8–27)
Bilirubin Total: 0.4 mg/dL (ref 0.0–1.2)
CALCIUM: 9.2 mg/dL (ref 8.7–10.3)
CO2: 25 mmol/L (ref 20–29)
Chloride: 101 mmol/L (ref 96–106)
Creatinine, Ser: 0.54 mg/dL — ABNORMAL LOW (ref 0.57–1.00)
GFR, EST AFRICAN AMERICAN: 118 mL/min/{1.73_m2} (ref >59)
GFR, EST NON AFRICAN AMERICAN: 102 mL/min/{1.73_m2} (ref >59)
GLUCOSE: 158 mg/dL — AB (ref 65–99)
Globulin, Total: 2.7 g/dL (ref 1.5–4.5)
Potassium: 4.2 mmol/L (ref 3.5–5.2)
Sodium: 141 mmol/L (ref 134–144)
TOTAL PROTEIN: 7.4 g/dL (ref 6.0–8.5)

## 2018-03-15 LAB — LIPID PANEL
CHOL/HDL RATIO: 3.5 ratio (ref 0.0–4.4)
Cholesterol, Total: 148 mg/dL (ref 100–199)
HDL: 42 mg/dL (ref >39)
LDL Calculated: 60 mg/dL (ref 0–99)
TRIGLYCERIDES: 229 mg/dL — AB (ref 0–149)
VLDL CHOLESTEROL CAL: 46 mg/dL — AB (ref 5–40)

## 2018-03-15 LAB — LIPASE: LIPASE: 44 U/L (ref 14–72)

## 2018-03-16 ENCOUNTER — Other Ambulatory Visit: Payer: Self-pay | Admitting: Family Medicine

## 2018-03-16 DIAGNOSIS — B3731 Acute candidiasis of vulva and vagina: Secondary | ICD-10-CM

## 2018-03-16 DIAGNOSIS — B373 Candidiasis of vulva and vagina: Secondary | ICD-10-CM

## 2018-03-16 NOTE — Telephone Encounter (Signed)
Refill request for general medication. Diflucan to [facility].   Last office visit: 03/14/18   Follow up on 04/19/18

## 2018-03-17 LAB — CULTURE, URINE COMPREHENSIVE

## 2018-03-17 LAB — SPECIMEN STATUS REPORT

## 2018-04-09 ENCOUNTER — Other Ambulatory Visit: Payer: Self-pay | Admitting: Family Medicine

## 2018-04-09 DIAGNOSIS — E785 Hyperlipidemia, unspecified: Secondary | ICD-10-CM

## 2018-04-09 DIAGNOSIS — E1169 Type 2 diabetes mellitus with other specified complication: Secondary | ICD-10-CM

## 2018-04-11 ENCOUNTER — Other Ambulatory Visit: Payer: Self-pay | Admitting: Family Medicine

## 2018-04-11 DIAGNOSIS — J3089 Other allergic rhinitis: Secondary | ICD-10-CM

## 2018-04-11 NOTE — Telephone Encounter (Signed)
Refill request for general medication. Xyzal to  [facility].   Last office visit 03/14/2018   Follow up on 04/19/2018

## 2018-04-19 ENCOUNTER — Ambulatory Visit: Payer: Managed Care, Other (non HMO) | Admitting: Family Medicine

## 2018-05-02 ENCOUNTER — Encounter: Payer: Self-pay | Admitting: Family Medicine

## 2018-05-02 ENCOUNTER — Ambulatory Visit: Payer: Managed Care, Other (non HMO) | Admitting: Family Medicine

## 2018-05-02 VITALS — BP 124/86 | HR 100 | Temp 98.1°F | Resp 14 | Ht 67.0 in | Wt 216.0 lb

## 2018-05-02 DIAGNOSIS — E1129 Type 2 diabetes mellitus with other diabetic kidney complication: Secondary | ICD-10-CM

## 2018-05-02 DIAGNOSIS — Z23 Encounter for immunization: Secondary | ICD-10-CM | POA: Diagnosis not present

## 2018-05-02 DIAGNOSIS — R1012 Left upper quadrant pain: Secondary | ICD-10-CM

## 2018-05-02 DIAGNOSIS — R809 Proteinuria, unspecified: Secondary | ICD-10-CM

## 2018-05-02 DIAGNOSIS — K5900 Constipation, unspecified: Secondary | ICD-10-CM | POA: Diagnosis not present

## 2018-05-02 DIAGNOSIS — J3089 Other allergic rhinitis: Secondary | ICD-10-CM | POA: Diagnosis not present

## 2018-05-02 DIAGNOSIS — E669 Obesity, unspecified: Secondary | ICD-10-CM

## 2018-05-02 MED ORDER — GLUCOSE BLOOD VI STRP: ORAL_STRIP | 12 refills | Status: DC

## 2018-05-02 MED ORDER — FREESTYLE LANCETS MISC: 3 refills | Status: DC

## 2018-05-02 MED ORDER — POLYETHYLENE GLYCOL 3350 17 GM/SCOOP PO POWD: 17.0000 g | Freq: Every day | ORAL | 0 refills | Status: DC | PRN

## 2018-05-02 NOTE — Progress Notes (Signed)
Name: Maria Cruz   MRN: [id number]    DOB: 06-01-1956   Date:05/02/2018       Progress Note  Subjective  Chief Complaint  Chief Complaint  Patient presents with  . Hypertension    follow up BP check , [facility] appeal form    HPI  DM II with proteinuria: urine microelevated 12/2017, normal GFR,continue ARB/CCB, synjardy and Ozempic - now on 1.0mg  and doing well.  BG's have been 130-160's.  Has lost 5lbs since last visit - goal is to start on sugar free lemonade, and decrease eating out. She denies polyphagia, polydipsia.  Does note some increased urination - is drinking more water intentionally.  Last A1C was 7.9% on 03/14/2018 - up from 33mos ago when it was 7.2%.  Left upper quadrant pain: Has significantly improved since last visit - using miralax PRN.  Denies NVD, blood in stool/dark and tarry stools. - Labs & KUB Xray from last visit were non-contributory, UA also negative.  She would like a refill of Miralax.   Allergic Rhinitis - LEFT ear is feeling a little clogged. Has not been using flonase daily - will start using daily.  Denies fevers/chills, cough, shortness of breath, chest pain, sinus pain - does endorse some sinus pressure.  Obesity: Discussed at length - discussed diet and eating out habits.  She is wanting to start walking more. Down about 5lbs since increasing ozempic dose. Has a nurse and pharmacist from [place] have been helping work with her through a lifestyle/wellness team/program.  Patient Active Problem List   Diagnosis Date Noted  . Microcalcification of left breast on mammogram 05/03/2017  . Herniated intervertebral disc of lumbar spine 09/02/2016  . Spondylolisthesis of lumbar region 09/02/2016  . Controlled type 2 diabetes mellitus with microalbuminuria, without long-term current use of insulin ([place]) 02/24/2015  . Vitamin D deficiency 02/24/2015  . Perennial allergic rhinitis 02/24/2015  . Hypercalcemia 02/24/2015  . GERD without esophagitis  02/24/2015  . Hypertension, benign 02/24/2015  . Controlled gout 02/24/2015  . Fatty liver 02/24/2015  . Callus of foot 02/24/2015  . Primary osteoarthritis of right hip 02/24/2015  . History of carpal tunnel syndrome 02/24/2015  . Obesity (BMI 30-39.9) 02/24/2015  . Umbilical hernia without obstruction or gangrene 02/24/2015  . Intermittent low back pain 02/24/2015  . History of colonic polyps 02/24/2015  . Dyslipidemia 02/24/2015    Past Surgical History:  Procedure Laterality Date  . ABDOMINAL HYSTERECTOMY  2012  . APPENDECTOMY  2012  . BREAST BIOPSY Right 07/12/2012   Negative  . BREAST BIOPSY Left 05/09/2017   Affirm Bx of two areas- path pending  . COLONOSCOPY  2009   Dr Allen Norris  . COLONOSCOPY WITH PROPOFOL N/A 07/27/2017   Procedure: COLONOSCOPY WITH PROPOFOL;  Surgeon: Robert Bellow, MD;  Location: [facility] ENDOSCOPY;  Service: Endoscopy;  Laterality: N/A;  . DILATION AND CURETTAGE OF UTERUS  2007  . TUBAL LIGATION      Family History  Problem Relation Age of Onset  . Multiple sclerosis Mother   . Heart attack Father   . Kidney failure Sister   . Heart attack Brother   . Diabetes Daughter   . CAD Sister   . Arthritis Sister        RA  . Lupus Sister   . Breast cancer Neg Hx   . Colon cancer Neg Hx     Social History   Socioeconomic History  . Marital status: Single    Spouse name:  Not on file  . Number of children: Not on file  . Years of education: Not on file  . Highest education level: Not on file  Occupational History  . Not on file  Social Needs  . Financial resource strain: Not on file  . Food insecurity:    Worry: Not on file    Inability: Not on file  . Transportation needs:    Medical: Not on file    Non-medical: Not on file  Tobacco Use  . Smoking status: Never Smoker  . Smokeless tobacco: Never Used  Substance and Sexual Activity  . Alcohol use: No    Alcohol/week: 0.0 standard drinks  . Drug use: No  . Sexual activity: Yes    Lifestyle  . Physical activity:    Days per week: Not on file    Minutes per session: Not on file  . Stress: Not on file  Relationships  . Social connections:    Talks on phone: Not on file    Gets together: Not on file    Attends religious service: Not on file    Active member of club or organization: Not on file    Attends meetings of clubs or organizations: Not on file    Relationship status: Not on file  . Intimate partner violence:    Fear of current or ex partner: Not on file    Emotionally abused: Not on file    Physically abused: Not on file    Forced sexual activity: Not on file  Other Topics Concern  . Not on file  Social History Narrative  . Not on file     Current Outpatient Medications:  .  acetaminophen (TYLENOL) 500 MG tablet, Take 1 tablet (500 mg total) by mouth every 6 (six) hours as needed., Disp: 90 tablet, Rfl: 0 .  allopurinol (ZYLOPRIM) 300 MG tablet, Take 1 tablet (300 mg total) by mouth 2 (two) times daily., Disp: 180 tablet, Rfl: 1 .  amLODipine-olmesartan (AZOR) 5-40 MG tablet, Take 1 tablet by mouth daily., Disp: 90 tablet, Rfl: 0 .  aspirin 81 MG chewable tablet, Chew 81 mg by mouth daily., Disp: , Rfl:  .  atorvastatin (LIPITOR) 40 MG tablet, TAKE 1 TABLET BY MOUTH  DAILY, Disp: 90 tablet, Rfl: 1 .  Cholecalciferol (VITAMIN D-1000 MAX ST) 1000 units tablet, Take by mouth., Disp: , Rfl:  .  Coenzyme Q10 (CO Q 10 PO), Take by mouth daily., Disp: , Rfl:  .  Empagliflozin-metFORMIN HCl ER (SYNJARDY XR) 25-1000 MG TB24, Take 1 tablet by mouth daily. In place of Jardiance and jentaduetoContinue Trulicity, Disp: 90 tablet, Rfl: 1 .  fluconazole (DIFLUCAN) 150 MG tablet, TAKE 1 TABLET(150 MG) BY MOUTH EVERY OTHER DAY, Disp: 3 tablet, Rfl: 0 .  fluticasone (FLONASE) 50 MCG/ACT nasal spray, SHAKE LIQUID AND USE 2 SPRAYS IN EACH NOSTRIL DAILY, Disp: 48 g, Rfl: 0 .  glucose blood (FREESTYLE LITE) test strip, Check blood sugar once daily and 2-3 times a day as  needed., Disp: 100 each, Rfl: 12 .  Lancets (FREESTYLE) lancets, Check blood sugar once daily and 2-3 times a day as needed., Disp: 100 each, Rfl: 3 .  levocetirizine (XYZAL) 5 MG tablet, TAKE 1 TABLET(5 MG) BY MOUTH EVERY EVENING, Disp: 90 tablet, Rfl: 0 .  montelukast (SINGULAIR) 10 MG tablet, Take 1 tablet (10 mg total) by mouth daily., Disp: 90 tablet, Rfl: 1 .  omega-3 acid ethyl esters (LOVAZA) 1 g capsule, Take 2 capsules (2  g total) by mouth 2 (two) times daily., Disp: 360 capsule, Rfl: 1 .  pregabalin (LYRICA) 150 MG capsule, Take 1 capsule (150 mg total) by mouth 2 (two) times daily., Disp: 180 capsule, Rfl: 0 .  Semaglutide (OZEMPIC) 1 MG/DOSE SOPN, Inject 1 mg into the skin once a week., Disp: 9 mL, Rfl: 1 .  traMADol (ULTRAM) 50 MG tablet, Take 1 tablet (50 mg total) by mouth 2 (two) times daily., Disp: 60 tablet, Rfl: 0 .  Vitamin D, Ergocalciferol, 2000 units CAPS, Take by mouth daily., Disp: , Rfl:  .  lidocaine (XYLOCAINE) 2 % solution, Use as directed 15 mLs in the mouth or throat every 4 (four) hours as needed for mouth pain. Swish and spit (Patient not taking: Reported on 03/14/2018), Disp: 200 mL, Rfl: 0 .  polyethylene glycol powder (GLYCOLAX/MIRALAX) powder, Take 17 g by mouth daily as needed for mild constipation or moderate constipation., Disp: 850 g, Rfl: 0  Allergies  Allergen Reactions  . Ace Inhibitors Cough  . Lipofen [Fenofibrate] Other (See Comments)    Localized drug reaction, blister on back  . Penicillins Itching  . Sulfa Antibiotics Itching    I personally reviewed active problem list, medication list, allergies, health maintenance, notes from last encounter, lab results with the patient/caregiver today.   ROS Ten systems reviewed and is negative except as mentioned in HPI  Objective  Vitals:   05/02/18 1002  BP: 124/86  Pulse: 100  Resp: 14  Temp: 98.1 F (36.7 C)  TempSrc: Oral  SpO2: 96%  Weight: 216 lb (98 kg)  Height: 5\' 7"  (1.702 m)     Body mass index is 33.83 kg/m.  Physical Exam Constitutional: Patient appears well-developed and well-nourished. No distress.  HENT: Head: Normocephalic and atraumatic. Ears: bilateral TMs with no erythema or effusion; Nose: Nose normal. Mouth/Throat: Oropharynx is clear and moist. No oropharyngeal exudate or tonsillar swelling.  Neck: Normal range of motion. Neck supple. No JVD present. No thyromegaly present.  Cardiovascular: Normal rate, regular rhythm and normal heart sounds.  No murmur heard. No BLE edema. Pulmonary/Chest: Effort normal and breath sounds normal. No respiratory distress. Abdominal: Soft. Bowel sounds are normal, no distension. There is no tenderness. No masses. Neurological: Pt is alert and oriented to person, place, and time. No cranial nerve deficit. Coordination, balance, strength, speech and gait are normal.  Skin: Skin is warm and dry. No rash noted. No erythema.  Psychiatric: Patient has a normal mood and affect. behavior is normal. Judgment and thought content normal.  No results found for this or any previous visit (from the past 72 hour(s)).  PHQ2/9: Depression screen [facility] 2/9 05/02/2018 03/14/2018 12/08/2017 12/01/2016 09/02/2016  Decreased Interest 0 0 0 0 0  Down, Depressed, Hopeless 0 0 0 0 0  PHQ - 2 Score 0 0 0 0 0  Altered sleeping 0 - - - -  Tired, decreased energy 0 - - - -  Change in appetite 0 - - - -  Feeling bad or failure about yourself  0 - - - -  Trouble concentrating 0 - - - -  Moving slowly or fidgety/restless 0 - - - -  Suicidal thoughts 0 - - - -  PHQ-9 Score 0 - - - -  Difficult doing work/chores Not difficult at all - - - -   Fall Risk: Fall Risk  05/02/2018 03/14/2018 12/08/2017 09/05/2017 06/02/2017  Falls in the past year? No No No No No    Assessment &  Plan  1. Controlled type 2 diabetes mellitus with microalbuminuria, without long-term current use of insulin ([place]) - Discussed CCM team, declines today and prefers to use [facility]'s  wellness program. Will start checking BG's 2-3 times a week and bring log to next follow up with Dr. Ancil Boozer. - Lancets (FREESTYLE) lancets; Check blood sugar once daily and 2-3 times a day as needed.  Dispense: 100 each; Refill: 3 - glucose blood (FREESTYLE LITE) test strip; Check blood sugar once daily and 2-3 times a day as needed.  Dispense: 100 each; Refill: 12  2. LUQ abdominal pain - polyethylene glycol powder (GLYCOLAX/MIRALAX) powder; Take 17 g by mouth daily as needed for mild constipation or moderate constipation.  Dispense: 850 g; Refill: 0  3. Constipation, unspecified constipation type - polyethylene glycol powder (GLYCOLAX/MIRALAX) powder; Take 17 g by mouth daily as needed for mild constipation or moderate constipation.  Dispense: 850 g; Refill: 0  4. Perennial allergic rhinitis - Start flonase daily; will try dymista if not improving  5. Obesity (BMI 30-39.9) - Discussed importance of 150 minutes of physical activity weekly, eat two servings of fish weekly, eat one serving of tree nuts ( cashews, pistachios, pecans, almonds.Marland Kitchen) every other day, eat 6 servings of fruit/vegetables daily and drink plenty of water and avoid sweet beverages.  - [facility] paperwork completed  6. Need for tetanus, diphtheria, and acellular pertussis (Tdap) vaccine - Tdap vaccine greater than or equal to 7yo IM

## 2018-05-26 ENCOUNTER — Other Ambulatory Visit: Payer: Self-pay | Admitting: Family Medicine

## 2018-05-26 DIAGNOSIS — J3089 Other allergic rhinitis: Secondary | ICD-10-CM

## 2018-06-05 ENCOUNTER — Other Ambulatory Visit (HOSPITAL_COMMUNITY)
Admission: RE | Admit: 2018-06-05 | Discharge: 2018-06-05 | Disposition: A | Discharge status: Home / Self Care | Payer: Managed Care, Other (non HMO) | Source: Ambulatory Visit | Attending: Nurse Practitioner | Admitting: Nurse Practitioner

## 2018-06-05 ENCOUNTER — Encounter: Payer: Self-pay | Admitting: Nurse Practitioner

## 2018-06-05 ENCOUNTER — Ambulatory Visit: Payer: Managed Care, Other (non HMO) | Admitting: Nurse Practitioner

## 2018-06-05 VITALS — BP 130/70 | HR 85 | Temp 97.8°F | Ht 67.0 in | Wt 209.9 lb

## 2018-06-05 DIAGNOSIS — R319 Hematuria, unspecified: Secondary | ICD-10-CM

## 2018-06-05 DIAGNOSIS — R300 Dysuria: Secondary | ICD-10-CM | POA: Diagnosis not present

## 2018-06-05 DIAGNOSIS — R800 Isolated proteinuria: Secondary | ICD-10-CM

## 2018-06-05 DIAGNOSIS — Z113 Encounter for screening for infections with a predominantly sexual mode of transmission: Secondary | ICD-10-CM | POA: Diagnosis not present

## 2018-06-05 DIAGNOSIS — N898 Other specified noninflammatory disorders of vagina: Secondary | ICD-10-CM | POA: Insufficient documentation

## 2018-06-05 DIAGNOSIS — E1129 Type 2 diabetes mellitus with other diabetic kidney complication: Secondary | ICD-10-CM

## 2018-06-05 DIAGNOSIS — R809 Proteinuria, unspecified: Secondary | ICD-10-CM

## 2018-06-05 LAB — POCT URINALYSIS DIPSTICK
APPEARANCE: NORMAL
BILIRUBIN UA: NEGATIVE
GLUCOSE UA: POSITIVE — AB
Ketones, UA: NEGATIVE
Leukocytes, UA: NEGATIVE
Nitrite, UA: NEGATIVE
Odor: NORMAL
Protein, UA: POSITIVE — AB
Spec Grav, UA: 1.02 (ref 1.010–1.025)
Urobilinogen, UA: 0.2 E.U./dL
pH, UA: 6 (ref 5.0–8.0)

## 2018-06-05 NOTE — Patient Instructions (Signed)
If you have not heard anything from my staff in a week about any results from today, please contact us here to follow-up [phone]

## 2018-06-05 NOTE — Progress Notes (Signed)
Name: Maria Cruz   MRN: [id number]    DOB: Aug 27, 1955   Date:06/05/2018       Progress Note  Subjective  Chief Complaint  Chief Complaint  Patient presents with  . Vaginitis    She is worried that she may have a condom stuck in her. She has symptoms that include discharge and itching.    HPI  Patient states 2 weeks ago states had sexual partner was using condom and afterwards didn't see it. Partner states he flushed it but she isn't sure if it is still in her. Patient endorses vaginal itching, she endorses some yellow vaginal discharge. Endorses some dysuria.   Patient Active Problem List   Diagnosis Date Noted  . Microcalcification of left breast on mammogram 05/03/2017  . Herniated intervertebral disc of lumbar spine 09/02/2016  . Spondylolisthesis of lumbar region 09/02/2016  . Controlled type 2 diabetes mellitus with microalbuminuria, without long-term current use of insulin ([place]) 02/24/2015  . Vitamin D deficiency 02/24/2015  . Perennial allergic rhinitis 02/24/2015  . Hypercalcemia 02/24/2015  . GERD without esophagitis 02/24/2015  . Hypertension, benign 02/24/2015  . Controlled gout 02/24/2015  . Fatty liver 02/24/2015  . Callus of foot 02/24/2015  . Primary osteoarthritis of right hip 02/24/2015  . History of carpal tunnel syndrome 02/24/2015  . Obesity (BMI 30-39.9) 02/24/2015  . Umbilical hernia without obstruction or gangrene 02/24/2015  . Intermittent low back pain 02/24/2015  . History of colonic polyps 02/24/2015  . Dyslipidemia 02/24/2015    Past Medical History:  Diagnosis Date  . Allergy   . Back pain with radiation   . Calluse    Foot  . Colon polyp   . Diabetes mellitus without complication ([place]) 7209  . Fatty liver   . GERD (gastroesophageal reflux disease)   . Hyperlipidemia   . Hypertension   . Lumbar radiculopathy    Dr. Mack Guise  . Obesity   . Plantar fasciitis, left   . Vitamin D deficiency     Past Surgical History:  Procedure  Laterality Date  . ABDOMINAL HYSTERECTOMY  2012  . APPENDECTOMY  2012  . BREAST BIOPSY Right 07/12/2012   Negative  . BREAST BIOPSY Left 05/09/2017   Affirm Bx of two areas- path pending  . COLONOSCOPY  2009   Dr Allen Norris  . COLONOSCOPY WITH PROPOFOL N/A 07/27/2017   Procedure: COLONOSCOPY WITH PROPOFOL;  Surgeon: Robert Bellow, MD;  Location: [facility] ENDOSCOPY;  Service: Endoscopy;  Laterality: N/A;  . DILATION AND CURETTAGE OF UTERUS  2007  . TUBAL LIGATION      Social History   Tobacco Use  . Smoking status: Never Smoker  . Smokeless tobacco: Never Used  Substance Use Topics  . Alcohol use: No    Alcohol/week: 0.0 standard drinks     Current Outpatient Medications:  .  acetaminophen (TYLENOL) 500 MG tablet, Take 1 tablet (500 mg total) by mouth every 6 (six) hours as needed., Disp: 90 tablet, Rfl: 0 .  allopurinol (ZYLOPRIM) 300 MG tablet, Take 1 tablet (300 mg total) by mouth 2 (two) times daily., Disp: 180 tablet, Rfl: 1 .  amLODipine-olmesartan (AZOR) 5-40 MG tablet, Take 1 tablet by mouth daily., Disp: 90 tablet, Rfl: 0 .  aspirin 81 MG chewable tablet, Chew 81 mg by mouth daily., Disp: , Rfl:  .  atorvastatin (LIPITOR) 40 MG tablet, TAKE 1 TABLET BY MOUTH  DAILY, Disp: 90 tablet, Rfl: 1 .  Cholecalciferol (VITAMIN D-1000 MAX ST) 1000 units tablet,  Take by mouth., Disp: , Rfl:  .  Coenzyme Q10 (CO Q 10 PO), Take by mouth daily., Disp: , Rfl:  .  Empagliflozin-metFORMIN HCl ER (SYNJARDY XR) 25-1000 MG TB24, Take 1 tablet by mouth daily. In place of Jardiance and jentaduetoContinue Trulicity, Disp: 90 tablet, Rfl: 1 .  fluconazole (DIFLUCAN) 150 MG tablet, TAKE 1 TABLET(150 MG) BY MOUTH EVERY OTHER DAY, Disp: 3 tablet, Rfl: 0 .  fluticasone (FLONASE) 50 MCG/ACT nasal spray, SHAKE LIQUID AND USE 2 SPRAYS IN EACH NOSTRIL DAILY, Disp: 48 g, Rfl: 0 .  glucose blood (FREESTYLE LITE) test strip, Check blood sugar once daily and 2-3 times a day as needed., Disp: 100 each, Rfl: 12 .   Lancets (FREESTYLE) lancets, Check blood sugar once daily and 2-3 times a day as needed., Disp: 100 each, Rfl: 3 .  levocetirizine (XYZAL) 5 MG tablet, TAKE 1 TABLET(5 MG) BY MOUTH EVERY EVENING, Disp: 90 tablet, Rfl: 0 .  lidocaine (XYLOCAINE) 2 % solution, Use as directed 15 mLs in the mouth or throat every 4 (four) hours as needed for mouth pain. Swish and spit, Disp: 200 mL, Rfl: 0 .  montelukast (SINGULAIR) 10 MG tablet, Take 1 tablet (10 mg total) by mouth daily., Disp: 90 tablet, Rfl: 1 .  omega-3 acid ethyl esters (LOVAZA) 1 g capsule, Take 2 capsules (2 g total) by mouth 2 (two) times daily., Disp: 360 capsule, Rfl: 1 .  polyethylene glycol powder (GLYCOLAX/MIRALAX) powder, Take 17 g by mouth daily as needed for mild constipation or moderate constipation., Disp: 850 g, Rfl: 0 .  pregabalin (LYRICA) 150 MG capsule, Take 1 capsule (150 mg total) by mouth 2 (two) times daily., Disp: 180 capsule, Rfl: 0 .  Semaglutide (OZEMPIC) 1 MG/DOSE SOPN, Inject 1 mg into the skin once a week., Disp: 9 mL, Rfl: 1 .  traMADol (ULTRAM) 50 MG tablet, Take 1 tablet (50 mg total) by mouth 2 (two) times daily., Disp: 60 tablet, Rfl: 0 .  Vitamin D, Ergocalciferol, 2000 units CAPS, Take by mouth daily., Disp: , Rfl:   Allergies  Allergen Reactions  . Ace Inhibitors Cough  . Lipofen [Fenofibrate] Other (See Comments)    Localized drug reaction, blister on back  . Penicillins Itching  . Sulfa Antibiotics Itching    ROS   No other specific complaints in a complete review of systems (except as listed in HPI above).  Objective  Vitals:   06/05/18 1440  BP: 130/70  Pulse: 85  Temp: 97.8 F (36.6 C)  TempSrc: Oral  SpO2: 99%  Weight: 209 lb 14.4 oz (95.2 kg)  Height: 5\' 7"  (1.702 m)     Body mass index is 32.87 kg/m.  Nursing Note and Vital Signs reviewed.  Physical Exam  Constitutional: She appears well-developed and well-nourished.  Cardiovascular: Normal rate.  Abdominal: Soft. Bowel  sounds are normal. There is no tenderness.  Genitourinary: Uterus normal. Pelvic exam was performed with patient prone. There is no tenderness on the right labia. There is no tenderness on the left labia. Cervical motion tenderness: no cervix. Vaginal discharge (yellow discharge) found.  Skin: Skin is warm and dry.  Psychiatric: She has a normal mood and affect. Her behavior is normal. Judgment and thought content normal.     No results found for this or any previous visit (from the past 48 hour(s)).  Assessment & Plan 1. Vaginal itching - Cervicovaginal ancillary only  2. Vaginal discharge - Cervicovaginal ancillary only  3. Screening for STD (  sexually transmitted disease) - HIV antibody (with reflex) - RPR - Hepatitis panel, acute - Cervicovaginal ancillary only  4. Dysuria - POCT Urinalysis Dipstick - negative for leuks positive for blood and protein, will send for microscopic, encouraged water intake, will recheck in one week after treatment if needed or in one week if no treatment needed.

## 2018-06-06 ENCOUNTER — Other Ambulatory Visit: Payer: Self-pay | Admitting: Nurse Practitioner

## 2018-06-06 DIAGNOSIS — R809 Proteinuria, unspecified: Secondary | ICD-10-CM

## 2018-06-06 DIAGNOSIS — R800 Isolated proteinuria: Secondary | ICD-10-CM

## 2018-06-06 DIAGNOSIS — E1129 Type 2 diabetes mellitus with other diabetic kidney complication: Secondary | ICD-10-CM

## 2018-06-06 LAB — RPR: RPR: NONREACTIVE

## 2018-06-06 LAB — HIV ANTIBODY (ROUTINE TESTING W REFLEX): HIV 1&2 Ab, 4th Generation: NONREACTIVE

## 2018-06-06 NOTE — Addendum Note (Signed)
Addended by: Chilton Greathouse on: 06/06/2018 04:14 PM   Modules accepted: Orders

## 2018-06-07 ENCOUNTER — Other Ambulatory Visit: Payer: Self-pay | Admitting: Nurse Practitioner

## 2018-06-07 ENCOUNTER — Telehealth: Payer: Self-pay | Admitting: Family Medicine

## 2018-06-07 ENCOUNTER — Other Ambulatory Visit: Payer: Self-pay | Admitting: Family Medicine

## 2018-06-07 DIAGNOSIS — B3731 Acute candidiasis of vulva and vagina: Secondary | ICD-10-CM

## 2018-06-07 DIAGNOSIS — B373 Candidiasis of vulva and vagina: Secondary | ICD-10-CM

## 2018-06-07 LAB — CERVICOVAGINAL ANCILLARY ONLY
Bacterial vaginitis: NEGATIVE
CHLAMYDIA, DNA PROBE: NEGATIVE
Candida vaginitis: POSITIVE — AB
Neisseria Gonorrhea: NEGATIVE
Trichomonas: NEGATIVE

## 2018-06-07 MED ORDER — FLUCONAZOLE 150 MG PO TABS: 150.0000 mg | ORAL_TABLET | Freq: Once | ORAL | 0 refills | Status: AC

## 2018-06-07 NOTE — Telephone Encounter (Signed)
Copied from [place] [id number]. Topic: Quick Communication - See Telephone Encounter >> Jun 07, 2018  9:54 AM Sheran Luz wrote: CRM for notification. See Telephone encounter for: 06/07/18.  Pt calling to check status of lab results from 10/28. Please advise.

## 2018-06-07 NOTE — Telephone Encounter (Signed)
Patient saw Benjamine Mola and it was annotated

## 2018-06-07 NOTE — Telephone Encounter (Signed)
Please sign off on labs so they can be reviewed.

## 2018-06-08 LAB — URINALYSIS, ROUTINE W REFLEX MICROSCOPIC
Bacteria, UA: NONE SEEN /HPF
Bilirubin Urine: NEGATIVE
HYALINE CAST: NONE SEEN /LPF
Hgb urine dipstick: NEGATIVE
Ketones, ur: NEGATIVE
Leukocytes, UA: NEGATIVE
Nitrite: NEGATIVE
PH: 5.5 (ref 5.0–8.0)
RBC / HPF: NONE SEEN /HPF (ref 0–2)
Specific Gravity, Urine: 1.026 (ref 1.001–1.03)
Squamous Epithelial / LPF: NONE SEEN /HPF (ref <=5)

## 2018-06-08 LAB — TEST AUTHORIZATION

## 2018-06-08 LAB — MICROALBUMIN / CREATININE URINE RATIO
Creatinine, Urine: 63 mg/dL (ref 20–275)
Microalb Creat Ratio: 403 mcg/mg creat — ABNORMAL HIGH (ref <30)
Microalb, Ur: 25.4 mg/dL

## 2018-06-08 NOTE — Telephone Encounter (Signed)
Chart was not reviewed until the evening and according to documentation, patient was unable to be reached.

## 2018-06-16 ENCOUNTER — Other Ambulatory Visit: Payer: Self-pay | Admitting: Family Medicine

## 2018-06-19 NOTE — Telephone Encounter (Signed)
She is due for a follow up in Dec with me.

## 2018-06-20 MED ORDER — FLUCONAZOLE 150 MG PO TABS: 150.0000 mg | ORAL_TABLET | ORAL | 0 refills | Status: DC

## 2018-06-20 NOTE — Telephone Encounter (Signed)
Spoke with pt and scheduled an appt for 12.19.19 with dr Ancil Boozer. Pt is also requesting a refill on medication for yeast infection stated that it has not cleared up. Asking that you send to [email or web address]

## 2018-07-27 ENCOUNTER — Encounter: Payer: Self-pay | Admitting: Family Medicine

## 2018-07-27 ENCOUNTER — Ambulatory Visit: Payer: Managed Care, Other (non HMO) | Admitting: Family Medicine

## 2018-07-27 VITALS — BP 120/80 | HR 95 | Temp 98.4°F | Resp 16 | Ht 67.0 in | Wt 214.3 lb

## 2018-07-27 DIAGNOSIS — E785 Hyperlipidemia, unspecified: Secondary | ICD-10-CM

## 2018-07-27 DIAGNOSIS — E1169 Type 2 diabetes mellitus with other specified complication: Secondary | ICD-10-CM

## 2018-07-27 DIAGNOSIS — M79605 Pain in left leg: Secondary | ICD-10-CM

## 2018-07-27 DIAGNOSIS — Z8269 Family history of other diseases of the musculoskeletal system and connective tissue: Secondary | ICD-10-CM

## 2018-07-27 DIAGNOSIS — M5126 Other intervertebral disc displacement, lumbar region: Secondary | ICD-10-CM

## 2018-07-27 DIAGNOSIS — R809 Proteinuria, unspecified: Secondary | ICD-10-CM | POA: Diagnosis not present

## 2018-07-27 DIAGNOSIS — J3089 Other allergic rhinitis: Secondary | ICD-10-CM | POA: Diagnosis not present

## 2018-07-27 DIAGNOSIS — I10 Essential (primary) hypertension: Secondary | ICD-10-CM | POA: Diagnosis not present

## 2018-07-27 DIAGNOSIS — M1611 Unilateral primary osteoarthritis, right hip: Secondary | ICD-10-CM

## 2018-07-27 DIAGNOSIS — Z8261 Family history of arthritis: Secondary | ICD-10-CM

## 2018-07-27 DIAGNOSIS — M109 Gout, unspecified: Secondary | ICD-10-CM | POA: Diagnosis not present

## 2018-07-27 DIAGNOSIS — E559 Vitamin D deficiency, unspecified: Secondary | ICD-10-CM

## 2018-07-27 DIAGNOSIS — E1129 Type 2 diabetes mellitus with other diabetic kidney complication: Secondary | ICD-10-CM | POA: Diagnosis not present

## 2018-07-27 DIAGNOSIS — Z841 Family history of disorders of kidney and ureter: Secondary | ICD-10-CM

## 2018-07-27 DIAGNOSIS — M545 Low back pain: Secondary | ICD-10-CM

## 2018-07-27 DIAGNOSIS — M21949 Unspecified acquired deformity of hand, unspecified hand: Secondary | ICD-10-CM

## 2018-07-27 LAB — POCT GLYCOSYLATED HEMOGLOBIN (HGB A1C)
HEMOGLOBIN A1C: 7.4 % (ref 4.0–5.6)
HEMOGLOBIN A1C: 7.4 % — AB (ref 4.0–5.6)
HbA1c, POC (controlled diabetic range): 7.4 % — AB (ref 0.0–7.0)
HbA1c, POC (prediabetic range): 7.4 % — AB (ref 5.7–6.4)

## 2018-07-27 MED ORDER — SEMAGLUTIDE (1 MG/DOSE) 2 MG/1.5ML ~~LOC~~ SOPN: 1.0000 mg | PEN_INJECTOR | SUBCUTANEOUS | 1 refills | Status: DC

## 2018-07-27 MED ORDER — AMLODIPINE-OLMESARTAN 5-40 MG PO TABS: 1.0000 | ORAL_TABLET | Freq: Every day | ORAL | 1 refills | Status: DC

## 2018-07-27 MED ORDER — TRAMADOL HCL 50 MG PO TABS: 50.0000 mg | ORAL_TABLET | Freq: Every evening | ORAL | 0 refills | Status: DC

## 2018-07-27 MED ORDER — ALLOPURINOL 300 MG PO TABS: 300.0000 mg | ORAL_TABLET | Freq: Two times a day (BID) | ORAL | 1 refills | Status: DC

## 2018-07-27 MED ORDER — FLUCONAZOLE 150 MG PO TABS: 150.0000 mg | ORAL_TABLET | ORAL | 0 refills | Status: DC

## 2018-07-27 MED ORDER — VITAMIN D (ERGOCALCIFEROL) 50 MCG (2000 UT) PO CAPS: 1.0000 | ORAL_CAPSULE | Freq: Every day | ORAL | 1 refills | Status: DC

## 2018-07-27 MED ORDER — PREGABALIN 150 MG PO CAPS: 150.0000 mg | ORAL_CAPSULE | Freq: Two times a day (BID) | ORAL | 1 refills | Status: DC

## 2018-07-27 MED ORDER — MONTELUKAST SODIUM 10 MG PO TABS: 10.0000 mg | ORAL_TABLET | Freq: Every day | ORAL | 1 refills | Status: DC

## 2018-07-27 NOTE — Patient Instructions (Signed)
Fleet feet to check for tennis shoes

## 2018-07-27 NOTE — Progress Notes (Signed)
Name: Maria Cruz   MRN: [id number]    DOB: [birth year]   Date:07/27/2018       Progress Note  Subjective  Chief Complaint  Chief Complaint  Patient presents with  . Diabetes  . Hyperlipidemia  . Hypertension    HPI  DM II with proteinuria: urine microelevated 12/2017 and even higher in October, we will place referral to nephrologist since now she has albuminuria. Last GFR was normal ,continue ARB/CCB, synjardi and Ozempic and  hgbA1C 7.4% today, down from 7.9%  She denies polyphagia, polydipsia or polyuria, glucose at home still goes up at times - this am was 200. She is eating smaller portions, eating more fruit and vegetables , she switched from regular sodas to diet drinks and is drinking more water, but she has been eating more desserts, needs to avoid desserts and glucose will get to goal .   Hyperlipidemia: Doing well on Lovaza and atorvastatin - deniesmuscle aches.Reviewed labs, last LDL was 60   HTN: Taking medication as prescribed, BPis at goal. Denies chest pain, palpitation or dizziness.   Gout: no recent episodes, she ran out of Allopurinol yesterday but fill it today,  denies side effects. Last uric acid was slightly low (2.2) on 09/08/2016. Last uric acid was at goal   OA hip/ Sciatica: MRI done 2014 and repeat 2019 . Antalgic gait.Seeing Ortho, had MRI that showed DDD lumbar spine , thinking about steroid injections, explained it will raise glucose, she is on Lyrica now and seems to help with symptoms . She would like a refill of Tramadol for pain   Left upper quadrant pain: she is now on Miralax and symptoms have improved  Joint deformities, back pain, hip pain and macroalbuminuria with family history of lupus and RA, we will check labs today   Patient Active Problem List   Diagnosis Date Noted  . Microcalcification of left breast on mammogram 05/03/2017  . Herniated intervertebral disc of lumbar spine 09/02/2016  . Spondylolisthesis of lumbar region  09/02/2016  . Controlled type 2 diabetes mellitus with microalbuminuria, without long-term current use of insulin ([place]) 02/24/2015  . Vitamin D deficiency 02/24/2015  . Perennial allergic rhinitis 02/24/2015  . Hypercalcemia 02/24/2015  . GERD without esophagitis 02/24/2015  . Hypertension, benign 02/24/2015  . Controlled gout 02/24/2015  . Fatty liver 02/24/2015  . Callus of foot 02/24/2015  . Primary osteoarthritis of right hip 02/24/2015  . History of carpal tunnel syndrome 02/24/2015  . Obesity (BMI 30-39.9) 02/24/2015  . Umbilical hernia without obstruction or gangrene 02/24/2015  . Intermittent low back pain 02/24/2015  . History of colonic polyps 02/24/2015  . Dyslipidemia 02/24/2015    Past Surgical History:  Procedure Laterality Date  . ABDOMINAL HYSTERECTOMY  2012  . APPENDECTOMY  2012  . BREAST BIOPSY Right 07/12/2012   Negative  . BREAST BIOPSY Left 05/09/2017   Affirm Bx of two areas- path pending  . COLONOSCOPY  2009   Dr Allen Norris  . COLONOSCOPY WITH PROPOFOL N/A 07/27/2017   Procedure: COLONOSCOPY WITH PROPOFOL;  Surgeon: Robert Bellow, MD;  Location: [facility] ENDOSCOPY;  Service: Endoscopy;  Laterality: N/A;  . DILATION AND CURETTAGE OF UTERUS  2007  . TUBAL LIGATION      Family History  Problem Relation Age of Onset  . Multiple sclerosis Mother   . Heart attack Father   . Kidney failure Sister   . Heart attack Brother   . Diabetes Daughter   . CAD Sister   .  Arthritis Sister        RA  . Lupus Sister   . Breast cancer Neg Hx   . Colon cancer Neg Hx     Social History   Socioeconomic History  . Marital status: Single    Spouse name: Not on file  . Number of children: 3  . Years of education: Not on file  . Highest education level: Not on file  Occupational History  . Not on file  Social Needs  . Financial resource strain: Not very hard  . Food insecurity:    Worry: Never true    Inability: Never true  . Transportation needs:    Medical: No     Non-medical: No  Tobacco Use  . Smoking status: Never Smoker  . Smokeless tobacco: Never Used  Substance and Sexual Activity  . Alcohol use: No    Alcohol/week: 0.0 standard drinks  . Drug use: No  . Sexual activity: Yes  Lifestyle  . Physical activity:    Days per week: 0 days    Minutes per session: 0 min  . Stress: Not at all  Relationships  . Social connections:    Talks on phone: Three times a week    Gets together: Three times a week    Attends religious service: Never    Active member of club or organization: No    Attends meetings of clubs or organizations: Never    Relationship status: Never married  . Intimate partner violence:    Fear of current or ex partner: No    Emotionally abused: No    Physically abused: No    Forced sexual activity: No  Other Topics Concern  . Not on file  Social History Narrative   Her grown daughter lives with her and her sons live in a house behind her      Current Outpatient Medications:  .  acetaminophen (TYLENOL) 500 MG tablet, Take 1 tablet (500 mg total) by mouth every 6 (six) hours as needed., Disp: 90 tablet, Rfl: 0 .  allopurinol (ZYLOPRIM) 300 MG tablet, Take 1 tablet (300 mg total) by mouth 2 (two) times daily., Disp: 180 tablet, Rfl: 1 .  amLODipine-olmesartan (AZOR) 5-40 MG tablet, Take 1 tablet by mouth daily., Disp: 90 tablet, Rfl: 0 .  aspirin 81 MG chewable tablet, Chew 81 mg by mouth daily., Disp: , Rfl:  .  atorvastatin (LIPITOR) 40 MG tablet, TAKE 1 TABLET BY MOUTH  DAILY, Disp: 90 tablet, Rfl: 1 .  Cholecalciferol (VITAMIN D-1000 MAX ST) 1000 units tablet, Take by mouth., Disp: , Rfl:  .  Coenzyme Q10 (CO Q 10 PO), Take by mouth daily., Disp: , Rfl:  .  fluconazole (DIFLUCAN) 150 MG tablet, Take 1 tablet (150 mg total) by mouth every other day., Disp: 3 tablet, Rfl: 0 .  fluticasone (FLONASE) 50 MCG/ACT nasal spray, SHAKE LIQUID AND USE 2 SPRAYS IN EACH NOSTRIL DAILY, Disp: 48 g, Rfl: 0 .  glucose blood  (FREESTYLE LITE) test strip, Check blood sugar once daily and 2-3 times a day as needed., Disp: 100 each, Rfl: 12 .  Lancets (FREESTYLE) lancets, Check blood sugar once daily and 2-3 times a day as needed., Disp: 100 each, Rfl: 3 .  levocetirizine (XYZAL) 5 MG tablet, TAKE 1 TABLET(5 MG) BY MOUTH EVERY EVENING, Disp: 90 tablet, Rfl: 0 .  lidocaine (XYLOCAINE) 2 % solution, Use as directed 15 mLs in the mouth or throat every 4 (four) hours as needed for  mouth pain. Swish and spit, Disp: 200 mL, Rfl: 0 .  montelukast (SINGULAIR) 10 MG tablet, Take 1 tablet (10 mg total) by mouth daily., Disp: 90 tablet, Rfl: 1 .  omega-3 acid ethyl esters (LOVAZA) 1 g capsule, Take 2 capsules (2 g total) by mouth 2 (two) times daily., Disp: 360 capsule, Rfl: 1 .  polyethylene glycol powder (GLYCOLAX/MIRALAX) powder, Take 17 g by mouth daily as needed for mild constipation or moderate constipation., Disp: 850 g, Rfl: 0 .  pregabalin (LYRICA) 150 MG capsule, Take 1 capsule (150 mg total) by mouth 2 (two) times daily., Disp: 180 capsule, Rfl: 0 .  Semaglutide (OZEMPIC) 1 MG/DOSE SOPN, Inject 1 mg into the skin once a week., Disp: 9 mL, Rfl: 1 .  SYNJARDY XR 25-1000 MG TB24, TAKE 1 TABLET BY MOUTH DAILY, Disp: 90 tablet, Rfl: 0 .  traMADol (ULTRAM) 50 MG tablet, Take 1 tablet (50 mg total) by mouth 2 (two) times daily., Disp: 60 tablet, Rfl: 0 .  Vitamin D, Ergocalciferol, 2000 units CAPS, Take by mouth daily., Disp: , Rfl:   Allergies  Allergen Reactions  . Ace Inhibitors Cough  . Lipofen [Fenofibrate] Other (See Comments)    Localized drug reaction, blister on back  . Penicillins Itching  . Sulfa Antibiotics Itching    I personally reviewed active problem list, medication list, allergies, family history, social history with the patient/caregiver today.   ROS  Constitutional: Negative for fever or weight change.  Respiratory: Negative for cough and shortness of breath.   Cardiovascular: Negative for chest  pain or palpitations.  Gastrointestinal: Negative for abdominal pain, no bowel changes.  Musculoskeletal: Positive for gait problem but no  joint swelling.  Skin: Negative for rash.  Neurological: Negative for dizziness or headache.  No other specific complaints in a complete review of systems (except as listed in HPI above).  Objective  Vitals:   07/27/18 1135  BP: 120/80  Pulse: 95  Resp: 16  Temp: 98.4 F (36.9 C)  TempSrc: Oral  SpO2: 98%  Weight: 214 lb 4.8 oz (97.2 kg)  Height: 5\' 7"  (1.702 m)    Body mass index is 33.56 kg/m.  Physical Exam  Constitutional: Patient appears well-developed and well-nourished. Obese  No distress.  HEENT: head atraumatic, normocephalic, pupils equal and reactive to light,  neck supple, throat within normal limits Cardiovascular: Normal rate, regular rhythm and normal heart sounds.  No murmur heard. No BLE edema. Pulmonary/Chest: Effort normal and breath sounds normal. No respiratory distress. Muscular Skeletal: antalgic gait, decrease rom of left hip Abdominal: Soft.  There is no tenderness. Psychiatric: Patient has a normal mood and affect. behavior is normal. Judgment and thought content normal.  Recent Results (from the past 2160 hour(s))  Cervicovaginal ancillary only     Status: Abnormal   Collection Time: 06/05/18 12:00 AM  Result Value Ref Range   Bacterial vaginitis Negative for Bacterial Vaginitis Microorganisms     Comment: Normal Reference Range - Negative   Candida vaginitis (A)     **POSITIVE for Candida species and Candida glabrata**    Comment: Normal Reference Range - Negative   Chlamydia Negative     Comment: Normal Reference Range - Negative   Neisseria gonorrhea Negative     Comment: Normal Reference Range - Negative   Trichomonas Negative     Comment: Normal Reference Range - Negative  HIV antibody (with reflex)     Status: None   Collection Time: 06/05/18  3:49 PM  Result Value Ref Range   HIV 1&2 Ab, 4th  Generation NON-REACTIVE NON-REACTI    Comment: HIV-1 antigen and HIV-1/HIV-2 antibodies were not detected. There is no laboratory evidence of HIV infection. Marland Kitchen PLEASE NOTE: This information has been disclosed to you from records whose confidentiality may be protected by state law.  If your state requires such protection, then the state law prohibits you from making any further disclosure of the information without the specific written consent of the person to whom it pertains, or as otherwise permitted by law. A general authorization for the release of medical or other information is NOT sufficient for this purpose. . For additional information please refer to http://education.quest[employer].com/faq/FAQ106 (This link is being provided for informational/ educational purposes only.) . Marland Kitchen The performance of this assay has not been clinically validated in patients less than 38 years old. .   RPR     Status: None   Collection Time: 06/05/18  3:49 PM  Result Value Ref Range   RPR Ser Ql NON-REACTIVE NON-REACTI  POCT Urinalysis Dipstick     Status: Abnormal   Collection Time: 06/05/18  3:55 PM  Result Value Ref Range   Color, UA Yellow    Clarity, UA Clear    Glucose, UA Positive (A) Negative   Bilirubin, UA Negative    Ketones, UA Negative    Spec Grav, UA 1.020 1.010 - 1.025   Blood, UA Trace    pH, UA 6.0 5.0 - 8.0   Protein, UA Positive (A) Negative   Urobilinogen, UA 0.2 0.2 or 1.0 E.U./dL   Nitrite, UA Negative    Leukocytes, UA Negative Negative   Appearance normal    Odor normal   Urinalysis, Routine w reflex microscopic     Status: Abnormal   Collection Time: 06/05/18  4:07 PM  Result Value Ref Range   Color, Urine YELLOW YELLOW   APPearance CLEAR CLEAR   Specific Gravity, Urine 1.026 1.001 - 1.03   pH 5.5 5.0 - 8.0   Glucose, UA 3+ (A) NEGATIVE   Bilirubin Urine NEGATIVE NEGATIVE   Ketones, ur NEGATIVE NEGATIVE   Hgb urine dipstick NEGATIVE NEGATIVE    Protein, ur 1+ (A) NEGATIVE   Nitrite NEGATIVE NEGATIVE   Leukocytes, UA NEGATIVE NEGATIVE   WBC, UA 0-5 0 - 5 /HPF   RBC / HPF NONE SEEN 0 - 2 /HPF   Squamous Epithelial / LPF NONE SEEN < OR = 5 /HPF   Bacteria, UA NONE SEEN NONE SEEN /HPF   Hyaline Cast NONE SEEN NONE SEEN /LPF  Microalbumin / creatinine urine ratio     Status: Abnormal   Collection Time: 06/05/18  4:07 PM  Result Value Ref Range   Creatinine, Urine 63 20 - 275 mg/dL   Microalb, Ur 25.4 mg/dL    Comment: Reference Range Not established    Microalb Creat Ratio 403 (H) <30 mcg/mg creat    Comment: . The ADA defines abnormalities in albumin excretion as follows: Marland Kitchen Category         Result (mcg/mg creatinine) . Normal                    <30 Microalbuminuria         30-299  Clinical albuminuria   > OR = 300 . The ADA recommends that at least two of three specimens collected within a 3-6 month period be abnormal before considering a patient to be within a diagnostic category.   TEST AUTHORIZATION  Status: None   Collection Time: 06/05/18  4:07 PM  Result Value Ref Range   TEST NAME: ALBUMIN, RANDOM URINE W/CREAT    TEST CODE: [id number]    CLIENT CONTACT: CINDI CANTRY    REPORT ALWAYS MESSAGE SIGNATURE      Comment: . The laboratory testing on this patient was verbally requested or confirmed by the ordering physician or his or her authorized representative after contact with an employee of [employer]. Federal regulations require that we maintain on file written authorization for all laboratory testing.  Accordingly we are asking that the ordering physician or his or her authorized representative sign a copy of this report and promptly return it to the client service representative. . . Signature:____________________________________________________ . Please fax this signed page to [phone] or return it via your [employer] courier.   POCT HgB A1C     Status: Abnormal   Collection  Time: 07/27/18 11:57 AM  Result Value Ref Range   Hemoglobin A1C 7.4 (A) 4.0 - 5.6 %   HbA1c POC (<> result, manual entry) 7.4 4.0 - 5.6 %   HbA1c, POC (prediabetic range) 7.4 (A) 5.7 - 6.4 %   HbA1c, POC (controlled diabetic range) 7.4 (A) 0.0 - 7.0 %      PHQ2/9: Depression screen [facility] 2/9 05/02/2018 03/14/2018 12/08/2017 12/01/2016 09/02/2016  Decreased Interest 0 0 0 0 0  Down, Depressed, Hopeless 0 0 0 0 0  PHQ - 2 Score 0 0 0 0 0  Altered sleeping 0 - - - -  Tired, decreased energy 0 - - - -  Change in appetite 0 - - - -  Feeling bad or failure about yourself  0 - - - -  Trouble concentrating 0 - - - -  Moving slowly or fidgety/restless 0 - - - -  Suicidal thoughts 0 - - - -  PHQ-9 Score 0 - - - -  Difficult doing work/chores Not difficult at all - - - -     Fall Risk: Fall Risk  07/27/2018 06/05/2018 05/02/2018 03/14/2018 12/08/2017  Falls in the past year? 0 No No No No  Number falls in past yr: 0 - - - -  Injury with Fall? 0 - - - -     Functional Status Survey: Is the patient deaf or have difficulty hearing?: No Does the patient have difficulty seeing, even when wearing glasses/contacts?: No Does the patient have difficulty concentrating, remembering, or making decisions?: No Does the patient have difficulty walking or climbing stairs?: No Does the patient have difficulty dressing or bathing?: No Does the patient have difficulty doing errands alone such as visiting a doctor's office or shopping?: No    Assessment & Plan   1. Controlled gout  - allopurinol (ZYLOPRIM) 300 MG tablet; Take 1 tablet (300 mg total) by mouth 2 (two) times daily.  Dispense: 180 tablet; Refill: 1  2. Hypertension, benign  - amLODipine-olmesartan (AZOR) 5-40 MG tablet; Take 1 tablet by mouth daily.  Dispense: 90 tablet; Refill: 1  3. Perennial allergic rhinitis  - montelukast (SINGULAIR) 10 MG tablet; Take 1 tablet (10 mg total) by mouth daily.  Dispense: 90 tablet; Refill: 1  4.  Dyslipidemia associated with type 2 diabetes mellitus (HCC)  - fluconazole (DIFLUCAN) 150 MG tablet; Take 1 tablet (150 mg total) by mouth every other day.  Dispense: 3 tablet; Refill: 0 - Semaglutide, 1 MG/DOSE, (OZEMPIC, 1 MG/DOSE,) 2 MG/1.5ML SOPN; Inject 1 mg into the skin once a week.  Dispense: 9 mL; Refill: 1  5. Controlled type 2 diabetes mellitus with microalbuminuria, without long-term current use of insulin (HCC)  - POCT HgB A1C - Semaglutide, 1 MG/DOSE, (OZEMPIC, 1 MG/DOSE,) 2 MG/1.5ML SOPN; Inject 1 mg into the skin once a week.  Dispense: 9 mL; Refill: 1  6. Herniated intervertebral disc of lumbar spine  - pregabalin (LYRICA) 150 MG capsule; Take 1 capsule (150 mg total) by mouth 2 (two) times daily.  Dispense: 180 capsule; Refill: 1  7. Vitamin D deficiency  - Vitamin D, Ergocalciferol, 50 MCG (2000 UT) CAPS; Take 1 capsule by mouth daily.  Dispense: 90 capsule; Refill: 1  8. Primary osteoarthritis of right hip  - traMADol (ULTRAM) 50 MG tablet; Take 1 tablet (50 mg total) by mouth every evening.  Dispense: 90 tablet; Refill: 0  9. Low back pain radiating to left lower extremity  - traMADol (ULTRAM) 50 MG tablet; Take 1 tablet (50 mg total) by mouth every evening.  Dispense: 90 tablet; Refill: 0  10. Albuminuria  - Ambulatory referral to Nephrology  11. Family history of lupus nephritis  - ANA+ENA+DNA/DS+Scl 70+SjoSSA/B - Rheumatoid Factor - Sedimentation rate - C-reactive protein  12. Family history of rheumatoid arthritis  - ANA+ENA+DNA/DS+Scl 70+SjoSSA/B - Rheumatoid Factor - Sedimentation rate - C-reactive protein  13. Deformity of hand, unspecified laterality  - ANA+ENA+DNA/DS+Scl 70+SjoSSA/B - Rheumatoid Factor - Sedimentation rate - C-reactive protein

## 2018-08-01 ENCOUNTER — Other Ambulatory Visit: Payer: Self-pay | Admitting: Family Medicine

## 2018-08-01 DIAGNOSIS — J3089 Other allergic rhinitis: Secondary | ICD-10-CM

## 2018-08-01 NOTE — Telephone Encounter (Signed)
Refill request for general medication: Levocetirizine 5 mg  Last office visit: 07/27/2018   Last physical exam: None indicated  Follow-ups on file.  10/26/2018

## 2018-08-05 LAB — ANA+ENA+DNA/DS+SCL 70+SJOSSA/B
ANA Titer 1: NEGATIVE
ENA RNP Ab: 0.2 AI (ref 0.0–0.9)
ENA SM Ab Ser-aCnc: <0.2 AI (ref 0.0–0.9)
ENA SSA (RO) Ab: <0.2 AI (ref 0.0–0.9)
ENA SSB (LA) Ab: <0.2 AI (ref 0.0–0.9)
Scleroderma SCL-70: 0.2 AI (ref 0.0–0.9)
dsDNA Ab: 1 IU/mL (ref 0–9)

## 2018-08-05 LAB — SEDIMENTATION RATE: Sed Rate: 19 mm/hr (ref 0–40)

## 2018-08-05 LAB — C-REACTIVE PROTEIN: CRP: 5 mg/L (ref 0–10)

## 2018-08-05 LAB — RHEUMATOID FACTOR: Rheumatoid fact SerPl-aCnc: 11.2 IU/mL (ref 0.0–13.9)

## 2018-08-23 ENCOUNTER — Ambulatory Visit: Payer: Managed Care, Other (non HMO) | Admitting: Family Medicine

## 2018-08-28 ENCOUNTER — Other Ambulatory Visit: Payer: Self-pay | Admitting: Family Medicine

## 2018-08-28 DIAGNOSIS — E1169 Type 2 diabetes mellitus with other specified complication: Secondary | ICD-10-CM

## 2018-08-28 DIAGNOSIS — E785 Hyperlipidemia, unspecified: Principal | ICD-10-CM

## 2018-08-28 DIAGNOSIS — E1129 Type 2 diabetes mellitus with other diabetic kidney complication: Secondary | ICD-10-CM

## 2018-08-28 DIAGNOSIS — R809 Proteinuria, unspecified: Secondary | ICD-10-CM

## 2018-08-28 NOTE — Telephone Encounter (Signed)
Request for diabetes medication. Ozempic   Last office visit pertaining to diabetes: 07/27/2018   Lab Results  Component Value Date   HGBA1C 7.4 (A) 07/27/2018   HGBA1C 7.4 07/27/2018   HGBA1C 7.4 (A) 07/27/2018   HGBA1C 7.4 (A) 07/27/2018      Follow up on 10/26/2018

## 2018-09-02 ENCOUNTER — Other Ambulatory Visit: Payer: Self-pay | Admitting: Family Medicine

## 2018-09-02 DIAGNOSIS — E785 Hyperlipidemia, unspecified: Secondary | ICD-10-CM

## 2018-09-02 DIAGNOSIS — E1169 Type 2 diabetes mellitus with other specified complication: Secondary | ICD-10-CM

## 2018-09-02 DIAGNOSIS — J3089 Other allergic rhinitis: Secondary | ICD-10-CM

## 2018-09-14 ENCOUNTER — Other Ambulatory Visit: Payer: Self-pay | Admitting: Family Medicine

## 2018-09-14 DIAGNOSIS — J3089 Other allergic rhinitis: Secondary | ICD-10-CM

## 2018-09-14 NOTE — Telephone Encounter (Signed)
Refill request for general medication. Xyzal to [facility].   Last office visit 07/27/2018   Follow up on 10/26/2018

## 2018-09-18 ENCOUNTER — Other Ambulatory Visit: Payer: Self-pay | Admitting: Family Medicine

## 2018-09-18 DIAGNOSIS — E785 Hyperlipidemia, unspecified: Principal | ICD-10-CM

## 2018-09-18 DIAGNOSIS — E1169 Type 2 diabetes mellitus with other specified complication: Secondary | ICD-10-CM

## 2018-09-18 NOTE — Telephone Encounter (Signed)
Refill request for general medication. Diflucan to [facility].   Last office visit 07/27/2018   Follow up 10/26/2018

## 2018-09-20 ENCOUNTER — Other Ambulatory Visit: Payer: Self-pay | Admitting: Family Medicine

## 2018-09-20 DIAGNOSIS — E1169 Type 2 diabetes mellitus with other specified complication: Secondary | ICD-10-CM

## 2018-09-20 DIAGNOSIS — E785 Hyperlipidemia, unspecified: Secondary | ICD-10-CM

## 2018-09-28 DIAGNOSIS — I10 Essential (primary) hypertension: Secondary | ICD-10-CM | POA: Diagnosis not present

## 2018-09-28 DIAGNOSIS — E1122 Type 2 diabetes mellitus with diabetic chronic kidney disease: Secondary | ICD-10-CM | POA: Diagnosis not present

## 2018-09-28 DIAGNOSIS — R809 Proteinuria, unspecified: Secondary | ICD-10-CM | POA: Diagnosis not present

## 2018-09-28 DIAGNOSIS — E1129 Type 2 diabetes mellitus with other diabetic kidney complication: Secondary | ICD-10-CM | POA: Diagnosis not present

## 2018-09-28 DIAGNOSIS — R319 Hematuria, unspecified: Secondary | ICD-10-CM | POA: Diagnosis not present

## 2018-10-26 ENCOUNTER — Ambulatory Visit (INDEPENDENT_AMBULATORY_CARE_PROVIDER_SITE_OTHER): Payer: Managed Care, Other (non HMO) | Admitting: Family Medicine

## 2018-10-26 ENCOUNTER — Other Ambulatory Visit: Payer: Self-pay | Admitting: Family Medicine

## 2018-10-26 ENCOUNTER — Other Ambulatory Visit: Payer: Self-pay

## 2018-10-26 ENCOUNTER — Encounter: Payer: Self-pay | Admitting: Family Medicine

## 2018-10-26 VITALS — BP 140/90 | HR 94 | Temp 97.9°F | Resp 16 | Ht 67.0 in | Wt 216.1 lb

## 2018-10-26 DIAGNOSIS — E1129 Type 2 diabetes mellitus with other diabetic kidney complication: Secondary | ICD-10-CM | POA: Diagnosis not present

## 2018-10-26 DIAGNOSIS — M1611 Unilateral primary osteoarthritis, right hip: Secondary | ICD-10-CM

## 2018-10-26 DIAGNOSIS — I10 Essential (primary) hypertension: Secondary | ICD-10-CM | POA: Diagnosis not present

## 2018-10-26 DIAGNOSIS — M5126 Other intervertebral disc displacement, lumbar region: Secondary | ICD-10-CM

## 2018-10-26 DIAGNOSIS — E1169 Type 2 diabetes mellitus with other specified complication: Secondary | ICD-10-CM

## 2018-10-26 DIAGNOSIS — M545 Low back pain, unspecified: Secondary | ICD-10-CM

## 2018-10-26 DIAGNOSIS — E785 Hyperlipidemia, unspecified: Secondary | ICD-10-CM

## 2018-10-26 DIAGNOSIS — J3089 Other allergic rhinitis: Secondary | ICD-10-CM | POA: Diagnosis not present

## 2018-10-26 DIAGNOSIS — R809 Proteinuria, unspecified: Secondary | ICD-10-CM | POA: Diagnosis not present

## 2018-10-26 DIAGNOSIS — M79605 Pain in left leg: Secondary | ICD-10-CM

## 2018-10-26 DIAGNOSIS — E669 Obesity, unspecified: Secondary | ICD-10-CM

## 2018-10-26 LAB — POCT GLYCOSYLATED HEMOGLOBIN (HGB A1C): Hemoglobin A1C: 6.8 % — AB (ref 4.0–5.6)

## 2018-10-26 MED ORDER — OMEGA-3-ACID ETHYL ESTERS 1 G PO CAPS: 2.0000 g | ORAL_CAPSULE | Freq: Two times a day (BID) | ORAL | 1 refills | Status: DC

## 2018-10-26 MED ORDER — AMLODIPINE-OLMESARTAN 5-40 MG PO TABS: 1.0000 | ORAL_TABLET | Freq: Every day | ORAL | 1 refills | Status: DC

## 2018-10-26 MED ORDER — TRAMADOL HCL 50 MG PO TABS: 50.0000 mg | ORAL_TABLET | Freq: Every evening | ORAL | 0 refills | Status: DC

## 2018-10-26 MED ORDER — LEVOCETIRIZINE DIHYDROCHLORIDE 5 MG PO TABS: 5.0000 mg | ORAL_TABLET | Freq: Every day | ORAL | 1 refills | Status: DC

## 2018-10-26 MED ORDER — FLUCONAZOLE 150 MG PO TABS: ORAL_TABLET | ORAL | 0 refills | Status: DC

## 2018-10-26 MED ORDER — PREGABALIN 150 MG PO CAPS: 150.0000 mg | ORAL_CAPSULE | Freq: Two times a day (BID) | ORAL | 1 refills | Status: DC

## 2018-10-26 MED ORDER — EMPAGLIFLOZIN-METFORMIN HCL ER 25-1000 MG PO TB24: 1.0000 | ORAL_TABLET | Freq: Every day | ORAL | 1 refills | Status: DC

## 2018-10-26 NOTE — Progress Notes (Signed)
Name: Maria Cruz   MRN: [id number]    DOB: 04/15/1956   Date:10/26/2018       Progress Note  Subjective  Chief Complaint  Chief Complaint  Patient presents with  . Diabetes  . Hypertension  . Hyperlipidemia    HPI  DM II with proteinuria: urine microelevated 12/2017 and even higher in October 2019 , she has seen nephrologist, and has follow up next week Last GFR was normal ,continue ARB/CCB, synjardi and Ozempic andhgbA1C  went down from 7.4% to 6.8% today She denies polyphagia, polydipsia or polyuria, glucose at home still goes up at times - this am was 189  She is eating smaller portions, eating more fruit and vegetables , she switched from regular sodas to diet drinks and is drinking more water, she went on a cruise recently ( back last week) she splurged a little but is back on a healthier diet   Hyperlipidemia: Doing well on Lovaza and atorvastatin - deniesmuscle aches.we will recheck labs   HTN: Taking medication as prescribed, BPis slightly elevated today, usually at goal, he is stressed out because of COVID-19 we will continue AZOR for now ( Norvasc plus Benicar ) Denies chest pain, palpitation or dizziness.   Gout: no recent episodes, taking Allopurinol daily, denies side effects. Recently had labs done by nephrologist including uric acid.   OA hip/ Sciatica: MRI done 2014and repeat 2019. Antalgic gait.Seeing Ortho, had MRI that showed DDD lumbar spine , she did not have steroid injections, taking Tylenol. Lyrica and Tramadol prn and it is helping with symptoms .   Left upper quadrant pain: she is now on Miralax and is doing much better, mild symptoms.   Joint deformities, back pain, hip pain and macroalbuminuria with family history of lupus and RA, but labs negative    Patient Active Problem List   Diagnosis Date Noted  . Microcalcification of left breast on mammogram 05/03/2017  . Herniated intervertebral disc of lumbar spine 09/02/2016  .  Spondylolisthesis of lumbar region 09/02/2016  . Controlled type 2 diabetes mellitus with microalbuminuria, without long-term current use of insulin ([place]) 02/24/2015  . Vitamin D deficiency 02/24/2015  . Perennial allergic rhinitis 02/24/2015  . Hypercalcemia 02/24/2015  . GERD without esophagitis 02/24/2015  . Hypertension, benign 02/24/2015  . Controlled gout 02/24/2015  . Fatty liver 02/24/2015  . Callus of foot 02/24/2015  . Primary osteoarthritis of right hip 02/24/2015  . History of carpal tunnel syndrome 02/24/2015  . Obesity (BMI 30-39.9) 02/24/2015  . Umbilical hernia without obstruction or gangrene 02/24/2015  . Intermittent low back pain 02/24/2015  . History of colonic polyps 02/24/2015  . Dyslipidemia 02/24/2015    Past Surgical History:  Procedure Laterality Date  . ABDOMINAL HYSTERECTOMY  2012  . APPENDECTOMY  2012  . BREAST BIOPSY Right 07/12/2012   Negative  . BREAST BIOPSY Left 05/09/2017   Affirm Bx of two areas- path pending  . COLONOSCOPY  2009   Dr Allen Norris  . COLONOSCOPY WITH PROPOFOL N/A 07/27/2017   Procedure: COLONOSCOPY WITH PROPOFOL;  Surgeon: Robert Bellow, MD;  Location: [facility] ENDOSCOPY;  Service: Endoscopy;  Laterality: N/A;  . DILATION AND CURETTAGE OF UTERUS  2007  . TUBAL LIGATION      Family History  Problem Relation Age of Onset  . Multiple sclerosis Mother   . Heart attack Father   . Kidney failure Sister   . Heart attack Brother   . Diabetes Daughter   . CAD Sister   .  Arthritis Sister        RA  . Lupus Sister   . Breast cancer Neg Hx   . Colon cancer Neg Hx     Social History   Socioeconomic History  . Marital status: Single    Spouse name: Not on file  . Number of children: 3  . Years of education: Not on file  . Highest education level: Not on file  Occupational History  . Not on file  Social Needs  . Financial resource strain: Not very hard  . Food insecurity:    Worry: Never true    Inability: Never true  .  Transportation needs:    Medical: No    Non-medical: No  Tobacco Use  . Smoking status: Never Smoker  . Smokeless tobacco: Never Used  Substance and Sexual Activity  . Alcohol use: No    Alcohol/week: 0.0 standard drinks  . Drug use: No  . Sexual activity: Yes  Lifestyle  . Physical activity:    Days per week: 0 days    Minutes per session: 0 min  . Stress: Not at all  Relationships  . Social connections:    Talks on phone: Three times a week    Gets together: Three times a week    Attends religious service: Never    Active member of club or organization: No    Attends meetings of clubs or organizations: Never    Relationship status: Never married  . Intimate partner violence:    Fear of current or ex partner: No    Emotionally abused: No    Physically abused: No    Forced sexual activity: No  Other Topics Concern  . Not on file  Social History Narrative   Her grown daughter lives with her and her sons live in a house behind her      Current Outpatient Medications:  .  acetaminophen (TYLENOL) 500 MG tablet, Take 1 tablet (500 mg total) by mouth every 6 (six) hours as needed., Disp: 90 tablet, Rfl: 0 .  allopurinol (ZYLOPRIM) 300 MG tablet, Take 1 tablet (300 mg total) by mouth 2 (two) times daily., Disp: 180 tablet, Rfl: 1 .  amLODipine-olmesartan (AZOR) 5-40 MG tablet, Take 1 tablet by mouth daily., Disp: 90 tablet, Rfl: 1 .  aspirin 81 MG chewable tablet, Chew 81 mg by mouth daily., Disp: , Rfl:  .  atorvastatin (LIPITOR) 40 MG tablet, TAKE 1 TABLET BY MOUTH  DAILY, Disp: 90 tablet, Rfl: 1 .  Cholecalciferol (VITAMIN D-1000 MAX ST) 1000 units tablet, Take by mouth., Disp: , Rfl:  .  Coenzyme Q10 (CO Q 10 PO), Take by mouth daily., Disp: , Rfl:  .  Empagliflozin-metFORMIN HCl ER (SYNJARDY XR) 25-1000 MG TB24, Take 1 tablet by mouth daily., Disp: 90 tablet, Rfl: 1 .  fluconazole (DIFLUCAN) 150 MG tablet, TAKE 1 TABLET(150 MG) BY MOUTH EVERY OTHER DAY, prn, Disp: 9 tablet,  Rfl: 0 .  fluticasone (FLONASE) 50 MCG/ACT nasal spray, SHAKE LIQUID AND USE 2 SPRAYS IN EACH NOSTRIL DAILY, Disp: 48 g, Rfl: 0 .  glucose blood (FREESTYLE LITE) test strip, Check blood sugar once daily and 2-3 times a day as needed., Disp: 100 each, Rfl: 12 .  Lancets (FREESTYLE) lancets, Check blood sugar once daily and 2-3 times a day as needed., Disp: 100 each, Rfl: 3 .  levocetirizine (XYZAL) 5 MG tablet, Take 1 tablet (5 mg total) by mouth daily., Disp: 90 tablet, Rfl: 1 .  montelukast (  SINGULAIR) 10 MG tablet, TAKE 1 TABLET(10 MG) BY MOUTH DAILY, Disp: 90 tablet, Rfl: 1 .  omega-3 acid ethyl esters (LOVAZA) 1 g capsule, Take 2 capsules (2 g total) by mouth 2 (two) times daily., Disp: 360 capsule, Rfl: 1 .  OZEMPIC, 1 MG/DOSE, 2 MG/1.5ML SOPN, INJECT 1 MG UNDER THE SKIN ONCE A WEEK, Disp: 9 mL, Rfl: 1 .  polyethylene glycol powder (GLYCOLAX/MIRALAX) powder, Take 17 g by mouth daily as needed for mild constipation or moderate constipation., Disp: 850 g, Rfl: 0 .  pregabalin (LYRICA) 150 MG capsule, Take 1 capsule (150 mg total) by mouth 2 (two) times daily., Disp: 180 capsule, Rfl: 1 .  traMADol (ULTRAM) 50 MG tablet, Take 1 tablet (50 mg total) by mouth every evening., Disp: 90 tablet, Rfl: 0 .  Vitamin D, Ergocalciferol, 50 MCG (2000 UT) CAPS, Take 1 capsule by mouth daily., Disp: 90 capsule, Rfl: 1  Allergies  Allergen Reactions  . Ace Inhibitors Cough  . Lipofen [Fenofibrate] Other (See Comments)    Localized drug reaction, blister on back  . Penicillins Itching  . Sulfa Antibiotics Itching    I personally reviewed active problem list, medication list, allergies, family history, social history with the patient/caregiver today.   ROS  Constitutional: Negative for fever or weight change.  Respiratory: Negative for cough and shortness of breath.   Cardiovascular: Negative for chest pain or palpitations.  Gastrointestinal: Negative for abdominal pain, no bowel changes.   Musculoskeletal: Negative for gait problem or joint swelling.  Skin: Negative for rash.  Neurological: Negative for dizziness or headache.  No other specific complaints in a complete review of systems (except as listed in HPI above).  Objective  Vitals:   10/26/18 0842  BP: 140/90  Pulse: 94  Resp: 16  Temp: 97.9 F (36.6 C)  TempSrc: Oral  SpO2: 99%  Weight: 216 lb 1.6 oz (98 kg)  Height: 5\' 7"  (1.702 m)    Body mass index is 33.85 kg/m.  Physical Exam  Constitutional: Patient appears well-developed and well-nourished. Obese No distress.  HEENT: head atraumatic, normocephalic, pupils equal and reactive to light,  neck supple, throat within normal limits Cardiovascular: Normal rate, regular rhythm and normal heart sounds.  No murmur heard. No BLE edema. Pulmonary/Chest: Effort normal and breath sounds normal. No respiratory distress. Abdominal: Soft.  There is no tenderness. Psychiatric: Patient has a normal mood and affect. behavior is normal. Judgment and thought content normal.  Recent Results (from the past 2160 hour(s))  ANA+ENA+DNA/DS+Scl 70+SjoSSA/B     Status: None   Collection Time: 08/03/18  8:35 AM  Result Value Ref Range   ANA Titer 1 Negative     Comment:                                      Negative   <1:80                                      Borderline  1:80                                      Positive   >1:80    dsDNA Ab 1 0 - 9 IU/mL    Comment:  Negative      <5                                    Equivocal  5 - 9                                    Positive      >9    ENA RNP Ab 0.2 0.0 - 0.9 AI   ENA SM Ab Ser-aCnc <0.2 0.0 - 0.9 AI   Scleroderma SCL-70 0.2 0.0 - 0.9 AI   ENA SSA (RO) Ab <0.2 0.0 - 0.9 AI   ENA SSB (LA) Ab <0.2 0.0 - 0.9 AI  Rheumatoid Factor     Status: None   Collection Time: 08/03/18  8:35 AM  Result Value Ref Range   Rhuematoid fact SerPl-aCnc 11.2 0.0 - 13.9 IU/mL  Sedimentation rate      Status: None   Collection Time: 08/03/18  8:35 AM  Result Value Ref Range   Sed Rate 19 0 - 40 mm/hr  C-reactive protein     Status: None   Collection Time: 08/03/18  8:35 AM  Result Value Ref Range   CRP 5 0 - 10 mg/L     PHQ2/9: Depression screen [facility] 2/9 10/26/2018 05/02/2018 03/14/2018 12/08/2017 12/01/2016  Decreased Interest 0 0 0 0 0  Down, Depressed, Hopeless 0 0 0 0 0  PHQ - 2 Score 0 0 0 0 0  Altered sleeping 0 0 - - -  Tired, decreased energy 0 0 - - -  Change in appetite 0 0 - - -  Feeling bad or failure about yourself  0 0 - - -  Trouble concentrating 0 0 - - -  Moving slowly or fidgety/restless 0 0 - - -  Suicidal thoughts 0 0 - - -  PHQ-9 Score 0 0 - - -  Difficult doing work/chores - Not difficult at all - - -    Fall Risk: Fall Risk  10/26/2018 07/27/2018 06/05/2018 05/02/2018 03/14/2018  Falls in the past year? 0 0 No No No  Number falls in past yr: 0 0 - - -  Injury with Fall? 0 0 - - -     Assessment & Plan  1. Controlled type 2 diabetes mellitus with microalbuminuria, without long-term current use of insulin ([facility])  - POCT HgB A1C - Empagliflozin-metFORMIN HCl ER (SYNJARDY XR) 25-1000 MG TB24; Take 1 tablet by mouth daily.  Dispense: 90 tablet; Refill: 1  2. Hypertension, benign  BP is borderline today, but everyone is a little anxious about COVID-19  - COMPLETE METABOLIC PANEL WITH GFR - amLODipine-olmesartan (AZOR) 5-40 MG tablet; Take 1 tablet by mouth daily.  Dispense: 90 tablet; Refill: 1  3. Dyslipidemia associated with type 2 diabetes mellitus ([facility])  - Lipid panel - COMPLETE METABOLIC PANEL WITH GFR - fluconazole (DIFLUCAN) 150 MG tablet; TAKE 1 TABLET(150 MG) BY MOUTH EVERY OTHER DAY, prn  Dispense: 9 tablet; Refill: 0 - Empagliflozin-metFORMIN HCl ER (SYNJARDY XR) 25-1000 MG TB24; Take 1 tablet by mouth daily.  Dispense: 90 tablet; Refill: 1 - omega-3 acid ethyl esters (LOVAZA) 1 g capsule; Take 2 capsules (2 g total) by mouth 2 (two) times  daily.  Dispense: 360 capsule; Refill: 1  4. Perennial allergic rhinitis  - levocetirizine (XYZAL) 5 MG tablet;  Take 1 tablet (5 mg total) by mouth daily.  Dispense: 90 tablet; Refill: 1  5. Herniated intervertebral disc of lumbar spine  - pregabalin (LYRICA) 150 MG capsule; Take 1 capsule (150 mg total) by mouth 2 (two) times daily.  Dispense: 180 capsule; Refill: 1  6. Dyslipidemia  - omega-3 acid ethyl esters (LOVAZA) 1 g capsule; Take 2 capsules (2 g total) by mouth 2 (two) times daily.  Dispense: 360 capsule; Refill: 1  7. Primary osteoarthritis of right hip  - traMADol (ULTRAM) 50 MG tablet; Take 1 tablet (50 mg total) by mouth every evening.  Dispense: 90 tablet; Refill: 0  8. Low back pain radiating to left lower extremity  - traMADol (ULTRAM) 50 MG tablet; Take 1 tablet (50 mg total) by mouth every evening.  Dispense: 90 tablet; Refill: 0

## 2018-10-27 LAB — COMPREHENSIVE METABOLIC PANEL
ALK PHOS: 111 IU/L (ref 39–117)
ALT: 38 IU/L — ABNORMAL HIGH (ref 0–32)
AST: 46 IU/L — ABNORMAL HIGH (ref 0–40)
Albumin/Globulin Ratio: 1.5 (ref 1.2–2.2)
Albumin: 4.6 g/dL (ref 3.8–4.8)
BUN/Creatinine Ratio: 20 (ref 12–28)
BUN: 12 mg/dL (ref 8–27)
Bilirubin Total: 0.4 mg/dL (ref 0.0–1.2)
CO2: 24 mmol/L (ref 20–29)
Calcium: 9.7 mg/dL (ref 8.7–10.3)
Chloride: 99 mmol/L (ref 96–106)
Creatinine, Ser: 0.6 mg/dL (ref 0.57–1.00)
GFR calc Af Amer: 114 mL/min/{1.73_m2} (ref >59)
GFR calc non Af Amer: 99 mL/min/{1.73_m2} (ref >59)
GLUCOSE: 133 mg/dL — AB (ref 65–99)
Globulin, Total: 3.1 g/dL (ref 1.5–4.5)
Potassium: 4.1 mmol/L (ref 3.5–5.2)
Sodium: 139 mmol/L (ref 134–144)
Total Protein: 7.7 g/dL (ref 6.0–8.5)

## 2018-10-27 LAB — LIPID PANEL
CHOLESTEROL TOTAL: 189 mg/dL (ref 100–199)
Chol/HDL Ratio: 4.3 ratio (ref 0.0–4.4)
HDL: 44 mg/dL (ref >39)
LDL Calculated: 73 mg/dL (ref 0–99)
Triglycerides: 362 mg/dL — ABNORMAL HIGH (ref 0–149)
VLDL Cholesterol Cal: 72 mg/dL — ABNORMAL HIGH (ref 5–40)

## 2018-10-30 ENCOUNTER — Other Ambulatory Visit: Payer: Self-pay | Admitting: Family Medicine

## 2018-10-31 ENCOUNTER — Other Ambulatory Visit: Payer: Self-pay | Admitting: Family Medicine

## 2018-10-31 DIAGNOSIS — E1129 Type 2 diabetes mellitus with other diabetic kidney complication: Secondary | ICD-10-CM

## 2018-10-31 DIAGNOSIS — E785 Hyperlipidemia, unspecified: Principal | ICD-10-CM

## 2018-10-31 DIAGNOSIS — E1169 Type 2 diabetes mellitus with other specified complication: Secondary | ICD-10-CM

## 2018-10-31 DIAGNOSIS — R809 Proteinuria, unspecified: Secondary | ICD-10-CM

## 2018-11-06 ENCOUNTER — Other Ambulatory Visit: Payer: Self-pay | Admitting: Family Medicine

## 2018-11-07 ENCOUNTER — Telehealth: Payer: Self-pay

## 2018-11-07 MED ORDER — OMEGA-3-ACID ETHYL ESTERS 1 G PO CAPS: 2.0000 g | ORAL_CAPSULE | Freq: Two times a day (BID) | ORAL | 1 refills | Status: DC

## 2018-11-07 NOTE — Telephone Encounter (Signed)
[facility] approved Lovaza 1 gm for 12 months through 11/06/2019. Please reorder and I will notify the patient.

## 2019-01-11 ENCOUNTER — Other Ambulatory Visit: Payer: Self-pay | Admitting: Nephrology

## 2019-01-11 ENCOUNTER — Other Ambulatory Visit (HOSPITAL_COMMUNITY): Payer: Self-pay | Admitting: Nephrology

## 2019-01-11 DIAGNOSIS — R809 Proteinuria, unspecified: Secondary | ICD-10-CM

## 2019-01-11 DIAGNOSIS — I10 Essential (primary) hypertension: Secondary | ICD-10-CM

## 2019-02-28 ENCOUNTER — Other Ambulatory Visit: Payer: Self-pay

## 2019-02-28 ENCOUNTER — Encounter: Payer: Self-pay | Admitting: Family Medicine

## 2019-02-28 ENCOUNTER — Ambulatory Visit (INDEPENDENT_AMBULATORY_CARE_PROVIDER_SITE_OTHER): Payer: 59 | Admitting: Family Medicine

## 2019-02-28 VITALS — BP 122/76 | HR 90 | Temp 97.1°F | Resp 16 | Ht 67.0 in | Wt 212.4 lb

## 2019-02-28 DIAGNOSIS — M109 Gout, unspecified: Secondary | ICD-10-CM

## 2019-02-28 DIAGNOSIS — E1169 Type 2 diabetes mellitus with other specified complication: Secondary | ICD-10-CM | POA: Diagnosis not present

## 2019-02-28 DIAGNOSIS — E559 Vitamin D deficiency, unspecified: Secondary | ICD-10-CM

## 2019-02-28 DIAGNOSIS — M79605 Pain in left leg: Secondary | ICD-10-CM

## 2019-02-28 DIAGNOSIS — E785 Hyperlipidemia, unspecified: Secondary | ICD-10-CM | POA: Diagnosis not present

## 2019-02-28 DIAGNOSIS — E1129 Type 2 diabetes mellitus with other diabetic kidney complication: Secondary | ICD-10-CM | POA: Diagnosis not present

## 2019-02-28 DIAGNOSIS — J3089 Other allergic rhinitis: Secondary | ICD-10-CM

## 2019-02-28 DIAGNOSIS — M5126 Other intervertebral disc displacement, lumbar region: Secondary | ICD-10-CM

## 2019-02-28 DIAGNOSIS — I10 Essential (primary) hypertension: Secondary | ICD-10-CM

## 2019-02-28 DIAGNOSIS — M1611 Unilateral primary osteoarthritis, right hip: Secondary | ICD-10-CM

## 2019-02-28 DIAGNOSIS — M545 Low back pain, unspecified: Secondary | ICD-10-CM

## 2019-02-28 DIAGNOSIS — R809 Proteinuria, unspecified: Secondary | ICD-10-CM

## 2019-02-28 LAB — POCT GLYCOSYLATED HEMOGLOBIN (HGB A1C): HbA1c, POC (controlled diabetic range): 7.3 % — AB (ref 0.0–7.0)

## 2019-02-28 MED ORDER — ATORVASTATIN CALCIUM 40 MG PO TABS: 40.0000 mg | ORAL_TABLET | Freq: Every day | ORAL | 1 refills | Status: DC

## 2019-02-28 MED ORDER — OMEGA-3-ACID ETHYL ESTERS 1 G PO CAPS: 2.0000 g | ORAL_CAPSULE | Freq: Two times a day (BID) | ORAL | 1 refills | Status: DC

## 2019-02-28 MED ORDER — OZEMPIC (1 MG/DOSE) 2 MG/1.5ML ~~LOC~~ SOPN: 1.0000 mg | PEN_INJECTOR | SUBCUTANEOUS | 1 refills | Status: DC

## 2019-02-28 MED ORDER — AMLODIPINE-OLMESARTAN 5-40 MG PO TABS: 1.0000 | ORAL_TABLET | Freq: Every day | ORAL | 1 refills | Status: DC

## 2019-02-28 MED ORDER — MONTELUKAST SODIUM 10 MG PO TABS: ORAL_TABLET | ORAL | 1 refills | Status: DC

## 2019-02-28 MED ORDER — SYNJARDY XR 25-1000 MG PO TB24: 1.0000 | ORAL_TABLET | Freq: Every day | ORAL | 1 refills | Status: DC

## 2019-02-28 MED ORDER — VITAMIN D (ERGOCALCIFEROL) 50 MCG (2000 UT) PO CAPS: 1.0000 | ORAL_CAPSULE | Freq: Every day | ORAL | 1 refills | Status: DC

## 2019-02-28 MED ORDER — ALLOPURINOL 300 MG PO TABS: 300.0000 mg | ORAL_TABLET | Freq: Two times a day (BID) | ORAL | 1 refills | Status: DC

## 2019-02-28 MED ORDER — FLUCONAZOLE 150 MG PO TABS: ORAL_TABLET | ORAL | 0 refills | Status: DC

## 2019-02-28 MED ORDER — PREGABALIN 150 MG PO CAPS: 150.0000 mg | ORAL_CAPSULE | Freq: Two times a day (BID) | ORAL | 1 refills | Status: DC

## 2019-02-28 MED ORDER — TRAMADOL HCL 50 MG PO TABS: 50.0000 mg | ORAL_TABLET | Freq: Every evening | ORAL | 0 refills | Status: DC

## 2019-02-28 NOTE — Progress Notes (Signed)
Name: Maria Cruz   MRN: [id number]    DOB: 1956/01/01   Date:02/28/2019       Progress Note  Subjective  Chief Complaint  Chief Complaint  Patient presents with  . Medication Refill  . Diabetes  . Hypertension    Denies any symptoms  . Hyperlipidemia  . Gout  . OA hip    HPI  DM II with proteinuria: urine microelevated 05/2019and even higher in October 2019 , she has seen nephrologist, and has follow up next week Last GFR was normal,continue ARB/CCB, synjardi and Ozempic andhgbA1C went down to 6.8% Feb 2020 and is up to 7.3%, she states she has been eating more sweets lately but will resume a healthier diet. She denies polyphagia, polydipsia or polyuria, glucose at home went up to 180 once this past week but usually 160's post-prandially. She is eating smaller portions, eating more fruit and vegetables , she switched from regular sodas to diet drinks and is drinking more water.   Hyperlipidemia: Doing well on Lovaza and atorvastatin - deniesmuscle aches.Continue medication last LDL was 73, triglycerides still elevated at 362  HTN: Taking medication and bp is at goal today, she is on AZOR for now ( Norvasc plus Benicar ) Denies chest pain, palpitation or dizziness.  Gout: no recent episodes,taking Allopurinol daily, denies side effects. Unchanged   OA hip/ Sciatica: MRI done 2014and repeat 2019. 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 Lyrica and Tramadol prn and it is helping with symptoms .   Joint deformities, back pain, hip pain and macroalbuminuria with family history of lupus and RA, but labs negative Unchanged   Patient Active Problem List   Diagnosis Date Noted  . Microcalcification of left breast on mammogram 05/03/2017  . Herniated intervertebral disc of lumbar spine 09/02/2016  . Spondylolisthesis of lumbar region 09/02/2016  .  Controlled type 2 diabetes mellitus with microalbuminuria, without long-term current use of insulin ([place]) 02/24/2015  . Vitamin D deficiency 02/24/2015  . Perennial allergic rhinitis 02/24/2015  . Hypercalcemia 02/24/2015  . GERD without esophagitis 02/24/2015  . Hypertension, benign 02/24/2015  . Controlled gout 02/24/2015  . Fatty liver 02/24/2015  . Callus of foot 02/24/2015  . Primary osteoarthritis of right hip 02/24/2015  . History of carpal tunnel syndrome 02/24/2015  . Obesity (BMI 30-39.9) 02/24/2015  . Umbilical hernia without obstruction or gangrene 02/24/2015  . Intermittent low back pain 02/24/2015  . History of colonic polyps 02/24/2015  . Dyslipidemia 02/24/2015    Past Surgical History:  Procedure Laterality Date  . ABDOMINAL HYSTERECTOMY  2012  . APPENDECTOMY  2012  . BREAST BIOPSY Right 07/12/2012   Negative  . BREAST BIOPSY Left 05/09/2017   Affirm Bx of two areas- path pending  . COLONOSCOPY  2009   Dr Allen Norris  . COLONOSCOPY WITH PROPOFOL N/A 07/27/2017   Procedure: COLONOSCOPY WITH PROPOFOL;  Surgeon: Robert Bellow, MD;  Location: [facility] ENDOSCOPY;  Service: Endoscopy;  Laterality: N/A;  . DILATION AND CURETTAGE OF UTERUS  2007  . TUBAL LIGATION      Family History  Problem Relation Age of Onset  . Multiple sclerosis Mother   . Heart attack Father   . Kidney failure Sister   . Heart attack Brother   . Diabetes Daughter   . CAD Sister   . Arthritis Sister  RA  . Lupus Sister   . Breast cancer Neg Hx   . Colon cancer Neg Hx     Social History   Socioeconomic History  . Marital status: Single    Spouse name: Not on file  . Number of children: 3  . Years of education: Not on file  . Highest education level: Not on file  Occupational History  . Not on file  Social Needs  . Financial resource strain: Not very hard  . Food insecurity    Worry: Never true    Inability: Never true  . Transportation needs    Medical: No    Non-medical:  No  Tobacco Use  . Smoking status: Never Smoker  . Smokeless tobacco: Never Used  Substance and Sexual Activity  . Alcohol use: No    Alcohol/week: 0.0 standard drinks  . Drug use: No  . Sexual activity: Yes  Lifestyle  . Physical activity    Days per week: 0 days    Minutes per session: 0 min  . Stress: Not at all  Relationships  . Social Herbalist on phone: Three times a week    Gets together: Three times a week    Attends religious service: Never    Active member of club or organization: No    Attends meetings of clubs or organizations: Never    Relationship status: Never married  . Intimate partner violence    Fear of current or ex partner: No    Emotionally abused: No    Physically abused: No    Forced sexual activity: No  Other Topics Concern  . Not on file  Social History Narrative   Her grown daughter lives with her and her sons live in a house behind her      Current Outpatient Medications:  .  acetaminophen (TYLENOL) 500 MG tablet, Take 1 tablet (500 mg total) by mouth every 6 (six) hours as needed., Disp: 90 tablet, Rfl: 0 .  allopurinol (ZYLOPRIM) 300 MG tablet, Take 1 tablet (300 mg total) by mouth 2 (two) times daily., Disp: 180 tablet, Rfl: 1 .  amLODipine-olmesartan (AZOR) 5-40 MG tablet, Take 1 tablet by mouth daily., Disp: 90 tablet, Rfl: 1 .  aspirin 81 MG chewable tablet, Chew 81 mg by mouth daily., Disp: , Rfl:  .  atorvastatin (LIPITOR) 40 MG tablet, TAKE 1 TABLET BY MOUTH  DAILY, Disp: 90 tablet, Rfl: 1 .  Cholecalciferol (VITAMIN D-1000 MAX ST) 1000 units tablet, Take by mouth., Disp: , Rfl:  .  Coenzyme Q10 (CO Q 10 PO), Take by mouth daily., Disp: , Rfl:  .  Empagliflozin-metFORMIN HCl ER (SYNJARDY XR) 25-1000 MG TB24, Take 1 tablet by mouth daily., Disp: 90 tablet, Rfl: 1 .  fluconazole (DIFLUCAN) 150 MG tablet, TAKE 1 TABLET(150 MG) BY MOUTH EVERY OTHER DAY, prn, Disp: 9 tablet, Rfl: 0 .  fluticasone (FLONASE) 50 MCG/ACT nasal spray,  SHAKE LIQUID AND USE 2 SPRAYS IN EACH NOSTRIL DAILY, Disp: 48 g, Rfl: 0 .  glucose blood (FREESTYLE LITE) test strip, Check blood sugar once daily and 2-3 times a day as needed., Disp: 100 each, Rfl: 12 .  Lancets (FREESTYLE) lancets, Check blood sugar once daily and 2-3 times a day as needed., Disp: 100 each, Rfl: 3 .  levocetirizine (XYZAL) 5 MG tablet, Take 1 tablet (5 mg total) by mouth daily., Disp: 90 tablet, Rfl: 1 .  montelukast (SINGULAIR) 10 MG tablet, TAKE 1 TABLET(10 MG) BY  MOUTH DAILY, Disp: 90 tablet, Rfl: 1 .  omega-3 acid ethyl esters (LOVAZA) 1 g capsule, Take 2 capsules (2 g total) by mouth 2 (two) times daily., Disp: 360 capsule, Rfl: 1 .  OZEMPIC, 1 MG/DOSE, 2 MG/1.5ML SOPN, INJECT 1 MG UNDER THE SKIN ONCE A WEEK, Disp: 9 mL, Rfl: 1 .  polyethylene glycol powder (GLYCOLAX/MIRALAX) powder, Take 17 g by mouth daily as needed for mild constipation or moderate constipation., Disp: 850 g, Rfl: 0 .  pregabalin (LYRICA) 150 MG capsule, Take 1 capsule (150 mg total) by mouth 2 (two) times daily., Disp: 180 capsule, Rfl: 1 .  traMADol (ULTRAM) 50 MG tablet, Take 1 tablet (50 mg total) by mouth every evening., Disp: 90 tablet, Rfl: 0 .  Vitamin D, Ergocalciferol, 50 MCG (2000 UT) CAPS, Take 1 capsule by mouth daily., Disp: 90 capsule, Rfl: 1  Allergies  Allergen Reactions  . Ace Inhibitors Cough  . Lipofen [Fenofibrate] Other (See Comments)    Localized drug reaction, blister on back  . Penicillins Itching  . Sulfa Antibiotics Itching    I personally reviewed active problem list, medication list, allergies, family history, social history with the patient/caregiver today.   ROS  Constitutional: Negative for fever or significant  weight change.  Respiratory: Negative for cough and shortness of breath.   Cardiovascular: Negative for chest pain or palpitations.  Gastrointestinal: Negative for abdominal pain, no bowel changes.  Musculoskeletal: Negative for gait problem or joint  swelling.  Skin: Negative for rash.  Neurological: Negative for dizziness or headache.  No other specific complaints in a complete review of systems (except as listed in HPI above).  Objective  Vitals:   02/28/19 0917  BP: 122/76  Pulse: 90  Resp: 16  Temp: (!) 97.1 F (36.2 C)  TempSrc: Temporal  SpO2: 98%  Weight: 212 lb 6.4 oz (96.3 kg)  Height: 5\' 7"  (1.702 m)    Body mass index is 33.27 kg/m.  Physical Exam  Constitutional: Patient appears well-developed and well-nourished. Obese No distress.  HEENT: head atraumatic, normocephalic, pupils equal and reactive to light,  neck supple Cardiovascular: Normal rate, regular rhythm and normal heart sounds.  No murmur heard. No BLE edema. Pulmonary/Chest: Effort normal and breath sounds normal. No respiratory distress. Abdominal: Soft.  There is no tenderness. Psychiatric: Patient has a normal mood and affect. behavior is normal. Judgment and thought content normal.  Recent Results (from the past 2160 hour(s))  POCT HgB A1C     Status: Abnormal   Collection Time: 02/28/19  9:26 AM  Result Value Ref Range   Hemoglobin A1C     HbA1c POC (<> result, manual entry)     HbA1c, POC (prediabetic range)     HbA1c, POC (controlled diabetic range) 7.3 (A) 0.0 - 7.0 %    PHQ2/9: Depression screen [facility] 2/9 02/28/2019 10/26/2018 05/02/2018 03/14/2018 12/08/2017  Decreased Interest 0 0 0 0 0  Down, Depressed, Hopeless 0 0 0 0 0  PHQ - 2 Score 0 0 0 0 0  Altered sleeping 0 0 0 - -  Tired, decreased energy 0 0 0 - -  Change in appetite 0 0 0 - -  Feeling bad or failure about yourself  0 0 0 - -  Trouble concentrating 0 0 0 - -  Moving slowly or fidgety/restless 0 0 0 - -  Suicidal thoughts 0 0 0 - -  PHQ-9 Score 0 0 0 - -  Difficult doing work/chores Not difficult at  all - Not difficult at all - -    phq 9 is negative   Fall Risk: Fall Risk  02/28/2019 10/26/2018 07/27/2018 06/05/2018 05/02/2018  Falls in the past year? 0 0 0 No No   Number falls in past yr: 0 0 0 - -  Injury with Fall? 0 0 0 - -     Functional Status Survey: Is the patient deaf or have difficulty hearing?: No Does the patient have difficulty seeing, even when wearing glasses/contacts?: No Does the patient have difficulty concentrating, remembering, or making decisions?: No Does the patient have difficulty walking or climbing stairs?: No Does the patient have difficulty dressing or bathing?: No Does the patient have difficulty doing errands alone such as visiting a doctor's office or shopping?: No    Assessment & Plan  1. Controlled type 2 diabetes mellitus with microalbuminuria, without long-term current use of insulin ([facility])  - POCT HgB A1C - Empagliflozin-metFORMIN HCl ER (SYNJARDY XR) 25-1000 MG TB24; Take 1 tablet by mouth daily.  Dispense: 90 tablet; Refill: 1 - Semaglutide, 1 MG/DOSE, (OZEMPIC, 1 MG/DOSE,) 2 MG/1.5ML SOPN; Inject 1 mg into the skin once a week.  Dispense: 9 mL; Refill: 1  2. Dyslipidemia  - atorvastatin (LIPITOR) 40 MG tablet; Take 1 tablet (40 mg total) by mouth daily.  Dispense: 90 tablet; Refill: 1 - montelukast (SINGULAIR) 10 MG tablet; TAKE 1 TABLET(10 MG) BY MOUTH DAILY  Dispense: 90 tablet; Refill: 1  3. Dyslipidemia associated with type 2 diabetes mellitus ([facility]CC)  - atorvastatin (LIPITOR) 40 MG tablet; Take 1 tablet (40 mg total) by mouth daily.  Dispense: 90 tablet; Refill: 1 - Empagliflozin-metFORMIN HCl ER (SYNJARDY XR) 25-1000 MG TB24; Take 1 tablet by mouth daily.  Dispense: 90 tablet; Refill: 1 - fluconazole (DIFLUCAN) 150 MG tablet; TAKE 1 TABLET(150 MG) BY MOUTH EVERY OTHER DAY, prn  Dispense: 9 tablet; Refill: 0 - omega-3 acid ethyl esters (LOVAZA) 1 g capsule; Take 2 capsules (2 g total) by mouth 2 (two) times daily.  Dispense: 360 capsule; Refill: 1 - Semaglutide, 1 MG/DOSE, (OZEMPIC, 1 MG/DOSE,) 2 MG/1.5ML SOPN; Inject 1 mg into the skin once a week.  Dispense: 9 mL; Refill: 1  4. Hypertension,  benign  - amLODipine-olmesartan (AZOR) 5-40 MG tablet; Take 1 tablet by mouth daily.  Dispense: 90 tablet; Refill: 1  5. Controlled gout  - allopurinol (ZYLOPRIM) 300 MG tablet; Take 1 tablet (300 mg total) by mouth 2 (two) times daily.  Dispense: 180 tablet; Refill: 1  6. Perennial allergic rhinitis  - montelukast (SINGULAIR) 10 MG tablet; TAKE 1 TABLET(10 MG) BY MOUTH DAILY  Dispense: 90 tablet; Refill: 1  7. Vitamin D deficiency  - Vitamin D, Ergocalciferol, 50 MCG (2000 UT) CAPS; Take 1 capsule by mouth daily.  Dispense: 90 capsule; Refill: 1  8. Primary osteoarthritis of right hip  - traMADol (ULTRAM) 50 MG tablet; Take 1 tablet (50 mg total) by mouth every evening.  Dispense: 90 tablet; Refill: 0  9. Low back pain radiating to left lower extremity  - traMADol (ULTRAM) 50 MG tablet; Take 1 tablet (50 mg total) by mouth every evening.  Dispense: 90 tablet; Refill: 0  10. Herniated intervertebral disc of lumbar spine  - pregabalin (LYRICA) 150 MG capsule; Take 1 capsule (150 mg total) by mouth 2 (two) times daily.  Dispense: 180 capsule; Refill: 1

## 2019-03-15 ENCOUNTER — Other Ambulatory Visit: Payer: Self-pay | Admitting: Family Medicine

## 2019-03-15 DIAGNOSIS — E785 Hyperlipidemia, unspecified: Secondary | ICD-10-CM

## 2019-03-15 DIAGNOSIS — J3089 Other allergic rhinitis: Secondary | ICD-10-CM

## 2019-03-15 NOTE — Telephone Encounter (Signed)
Refill request for general medication. Singulair   Last office visit 02/28/2019   Follow up on 07/02/2019

## 2019-03-16 ENCOUNTER — Other Ambulatory Visit: Payer: Self-pay | Admitting: Family Medicine

## 2019-03-16 DIAGNOSIS — E1169 Type 2 diabetes mellitus with other specified complication: Secondary | ICD-10-CM

## 2019-03-16 DIAGNOSIS — E1129 Type 2 diabetes mellitus with other diabetic kidney complication: Secondary | ICD-10-CM

## 2019-03-16 DIAGNOSIS — E785 Hyperlipidemia, unspecified: Secondary | ICD-10-CM

## 2019-03-16 DIAGNOSIS — R809 Proteinuria, unspecified: Secondary | ICD-10-CM

## 2019-04-05 ENCOUNTER — Ambulatory Visit
Admission: RE | Admit: 2019-04-05 | Discharge: 2019-04-05 | Disposition: A | Discharge status: Home / Self Care | Payer: 59 | Source: Ambulatory Visit | Attending: Nephrology | Admitting: Nephrology

## 2019-04-05 ENCOUNTER — Other Ambulatory Visit: Payer: Self-pay

## 2019-04-05 DIAGNOSIS — R809 Proteinuria, unspecified: Secondary | ICD-10-CM | POA: Diagnosis present

## 2019-04-05 DIAGNOSIS — I10 Essential (primary) hypertension: Secondary | ICD-10-CM | POA: Diagnosis present

## 2019-04-23 ENCOUNTER — Other Ambulatory Visit: Payer: Self-pay | Admitting: Family Medicine

## 2019-04-23 DIAGNOSIS — I10 Essential (primary) hypertension: Secondary | ICD-10-CM

## 2019-04-28 ENCOUNTER — Other Ambulatory Visit: Payer: Self-pay | Admitting: Family Medicine

## 2019-04-28 DIAGNOSIS — E1169 Type 2 diabetes mellitus with other specified complication: Secondary | ICD-10-CM

## 2019-04-28 DIAGNOSIS — M109 Gout, unspecified: Secondary | ICD-10-CM

## 2019-04-28 DIAGNOSIS — E1129 Type 2 diabetes mellitus with other diabetic kidney complication: Secondary | ICD-10-CM

## 2019-04-28 DIAGNOSIS — R809 Proteinuria, unspecified: Secondary | ICD-10-CM

## 2019-04-28 NOTE — Telephone Encounter (Signed)
Requested Prescriptions  Pending Prescriptions Disp Refills  . SYNJARDY XR 25-1000 MG TB24 [Pharmacy Med Name: SYNJARDY XR 25-1000 MG TABLETS] 90 tablet 1    Sig: TAKE 1 TABLET BY MOUTH DAILY     There is no refill protocol information for this order    . allopurinol (ZYLOPRIM) 300 MG tablet [Pharmacy Med Name: ALLOPURINOL 300MG  TABLETS] 180 tablet 1    Sig: TAKE 1 TABLET(300 MG) BY MOUTH TWICE DAILY     Endocrinology:  Gout Agents Failed - 04/28/2019  3:18 AM      Failed - Uric Acid in normal range and within 360 days    Uric Acid  Date Value Ref Range Status  12/08/2017 3.4 2.5 - 7.1 mg/dL Final    Comment:               Therapeutic target for gout patients: <6.0         Passed - Cr in normal range and within 360 days    Creatinine, Ser  Date Value Ref Range Status  10/26/2018 0.60 0.57 - 1.00 mg/dL Final         Passed - Valid encounter within last 12 months    Recent Outpatient Visits          1 month ago Dyslipidemia associated with type 2 diabetes mellitus [facility])   [facility] Steele Sizer, MD   6 months ago Controlled type 2 diabetes mellitus with microalbuminuria, without long-term current use of insulin [facility])   [facility] Steele Sizer, MD   9 months ago Controlled type 2 diabetes mellitus with microalbuminuria, without long-term current use of insulin [facility])   [facility] Steele Sizer, MD   10 months ago Vaginal itching   [place], [place]   12 months ago Controlled type 2 diabetes mellitus with microalbuminuria, without long-term current use of insulin [facility])   [place], [place], [place]      Future Appointments            In 2 months Steele Sizer, MD [facility]

## 2019-04-30 ENCOUNTER — Other Ambulatory Visit: Payer: Self-pay | Admitting: Family Medicine

## 2019-04-30 DIAGNOSIS — J3089 Other allergic rhinitis: Secondary | ICD-10-CM

## 2019-05-07 ENCOUNTER — Other Ambulatory Visit: Payer: Self-pay | Admitting: Family Medicine

## 2019-05-07 DIAGNOSIS — E785 Hyperlipidemia, unspecified: Secondary | ICD-10-CM

## 2019-05-07 DIAGNOSIS — E1169 Type 2 diabetes mellitus with other specified complication: Secondary | ICD-10-CM

## 2019-05-15 ENCOUNTER — Other Ambulatory Visit: Payer: Self-pay | Admitting: Family Medicine

## 2019-05-15 DIAGNOSIS — E1169 Type 2 diabetes mellitus with other specified complication: Secondary | ICD-10-CM

## 2019-05-16 ENCOUNTER — Other Ambulatory Visit: Payer: Self-pay | Admitting: Family Medicine

## 2019-05-16 DIAGNOSIS — I10 Essential (primary) hypertension: Secondary | ICD-10-CM

## 2019-07-02 ENCOUNTER — Encounter: Payer: Self-pay | Admitting: Family Medicine

## 2019-07-02 ENCOUNTER — Ambulatory Visit (INDEPENDENT_AMBULATORY_CARE_PROVIDER_SITE_OTHER): Payer: 59 | Admitting: Family Medicine

## 2019-07-02 ENCOUNTER — Other Ambulatory Visit: Payer: Self-pay

## 2019-07-02 DIAGNOSIS — M5126 Other intervertebral disc displacement, lumbar region: Secondary | ICD-10-CM

## 2019-07-02 DIAGNOSIS — E785 Hyperlipidemia, unspecified: Secondary | ICD-10-CM

## 2019-07-02 DIAGNOSIS — E1169 Type 2 diabetes mellitus with other specified complication: Secondary | ICD-10-CM

## 2019-07-02 DIAGNOSIS — M545 Low back pain, unspecified: Secondary | ICD-10-CM

## 2019-07-02 DIAGNOSIS — I10 Essential (primary) hypertension: Secondary | ICD-10-CM

## 2019-07-02 DIAGNOSIS — J3089 Other allergic rhinitis: Secondary | ICD-10-CM

## 2019-07-02 DIAGNOSIS — B373 Candidiasis of vulva and vagina: Secondary | ICD-10-CM

## 2019-07-02 DIAGNOSIS — M1611 Unilateral primary osteoarthritis, right hip: Secondary | ICD-10-CM

## 2019-07-02 DIAGNOSIS — E669 Obesity, unspecified: Secondary | ICD-10-CM

## 2019-07-02 DIAGNOSIS — B3731 Acute candidiasis of vulva and vagina: Secondary | ICD-10-CM

## 2019-07-02 DIAGNOSIS — E559 Vitamin D deficiency, unspecified: Secondary | ICD-10-CM

## 2019-07-02 DIAGNOSIS — M109 Gout, unspecified: Secondary | ICD-10-CM

## 2019-07-02 DIAGNOSIS — M79605 Pain in left leg: Secondary | ICD-10-CM

## 2019-07-02 MED ORDER — FREESTYLE LANCETS MISC: 3 refills | Status: DC

## 2019-07-02 MED ORDER — TRAMADOL HCL 50 MG PO TABS: 50.0000 mg | ORAL_TABLET | Freq: Every evening | ORAL | 0 refills | Status: DC

## 2019-07-02 MED ORDER — FREESTYLE LITE TEST VI STRP: ORAL_STRIP | 12 refills | Status: DC

## 2019-07-02 MED ORDER — OZEMPIC (1 MG/DOSE) 2 MG/1.5ML ~~LOC~~ SOPN: 1.0000 mg | PEN_INJECTOR | SUBCUTANEOUS | 1 refills | Status: DC

## 2019-07-02 MED ORDER — PREGABALIN 200 MG PO CAPS: 200.0000 mg | ORAL_CAPSULE | Freq: Two times a day (BID) | ORAL | 1 refills | Status: DC

## 2019-07-02 MED ORDER — FREESTYLE LITE DEVI: 1.0000 | Freq: Every day | 0 refills | Status: DC

## 2019-07-02 MED ORDER — SYNJARDY XR 25-1000 MG PO TB24: 1.0000 | ORAL_TABLET | Freq: Every day | ORAL | 1 refills | Status: DC

## 2019-07-02 MED ORDER — ATORVASTATIN CALCIUM 40 MG PO TABS: 40.0000 mg | ORAL_TABLET | Freq: Every day | ORAL | 1 refills | Status: DC

## 2019-07-02 MED ORDER — FLUCONAZOLE 150 MG PO TABS: ORAL_TABLET | ORAL | 0 refills | Status: DC

## 2019-07-02 MED ORDER — MONTELUKAST SODIUM 10 MG PO TABS: 10.0000 mg | ORAL_TABLET | Freq: Every day | ORAL | 1 refills | Status: DC

## 2019-07-02 NOTE — Progress Notes (Signed)
Name: Maria Cruz   MRN: [id number]    DOB: 09-02-1955   Date:07/02/2019       Progress Note  Subjective  Chief Complaint  Chief Complaint  Patient presents with  . Medication Refill    4 month F/U-Needs refill on Miralax, Tramadol and Ozempic  . Diabetes    Average 123 Lowest-113 Highest-250  . Hypertension    Denies any symptoms  . Hyperlipidemia  . Gout  . OA hip/ Sciatica    I connected with  GARNETTE MOLLISON  on 07/02/19 at 10:00 AM EST by a video enabled telemedicine application and verified that I am speaking with the correct person using two identifiers.  I discussed the limitations of evaluation and management by telemedicine and the availability of in person appointments. The patient expressed understanding and agreed to proceed. Staff also discussed with the patient that there may be a patient responsible charge related to this service. Patient Location: at home  Provider Location: [facility]    HPI  DM II with proteinuria: urine microelevated 05/2019and even higher in October2019,she is seeing  nephrologist continue ARB/CCB, Synjardi and Ozempic andhgbA1Cwent down to 6.8% Feb 2020 and was up to 7.3% in July 2020  , at the time she was eating more sweets and decided to resume a healthier diet, she states only really following the diet over the past week.  She denies polyphagia, polydipsia or polyuria. Glucose at home has been 113-250 , most of the time 130's.  Hyperlipidemia: Doing well on Lovaza and atorvastatin - deniesmuscle aches.Continue medication last LDL was 73, triglycerides still elevated at 362, we will recheck on her next visit - advised in person visit   HTN: Taking medication and bp is usually at [place], but today at home it was 115/95 - she states she has been eating a lot of salt lately, she will try to avoid it again. She is on AZOR for now ( Norvasc plus Benicar )Denies chest pain, palpitation or dizziness.  Gout: no  recent episodes,taking Allopurinol daily,denies side effects. Unchanged   OA hip/ Sciatica: MRI done 2014and repeat 2019. She has mild limp when standing for a long time at work, she has not been back to Ortho lately, had MRIthat showed DDD lumbar spine ,she did not have steroid injections, taking Tylenol. Lyrica and Tramadol prn, pain is getting worse. She is trying to move more.   Patient Active Problem List   Diagnosis Date Noted  . Microcalcification of left breast on mammogram 05/03/2017  . Herniated intervertebral disc of lumbar spine 09/02/2016  . Spondylolisthesis of lumbar region 09/02/2016  . Controlled type 2 diabetes mellitus with microalbuminuria, without long-term current use of insulin ([place]) 02/24/2015  . Vitamin D deficiency 02/24/2015  . Perennial allergic rhinitis 02/24/2015  . Hypercalcemia 02/24/2015  . GERD without esophagitis 02/24/2015  . Hypertension, benign 02/24/2015  . Controlled gout 02/24/2015  . Fatty liver 02/24/2015  . Callus of foot 02/24/2015  . Primary osteoarthritis of right hip 02/24/2015  . History of carpal tunnel syndrome 02/24/2015  . Obesity (BMI 30-39.9) 02/24/2015  . Umbilical hernia without obstruction or gangrene 02/24/2015  . Intermittent low back pain 02/24/2015  . History of colonic polyps 02/24/2015  . Dyslipidemia 02/24/2015    Past Surgical History:  Procedure Laterality Date  . ABDOMINAL HYSTERECTOMY  2012  . APPENDECTOMY  2012  . BREAST BIOPSY Right 07/12/2012   Negative  . BREAST BIOPSY Left 05/09/2017   Affirm Bx of  two areas- path pending  . COLONOSCOPY  2009   Dr Allen Norris  . COLONOSCOPY WITH PROPOFOL N/A 07/27/2017   Procedure: COLONOSCOPY WITH PROPOFOL;  Surgeon: Robert Bellow, MD;  Location: [facility] ENDOSCOPY;  Service: Endoscopy;  Laterality: N/A;  . DILATION AND CURETTAGE OF UTERUS  2007  . TUBAL LIGATION      Family History  Problem Relation Age of Onset  . Multiple sclerosis Mother   . Heart attack  Father   . Kidney failure Sister   . Heart attack Brother   . Diabetes Daughter   . CAD Sister   . Arthritis Sister        RA  . Lupus Sister   . Breast cancer Neg Hx   . Colon cancer Neg Hx     Social History   Socioeconomic History  . Marital status: Single    Spouse name: Not on file  . Number of children: 3  . Years of education: Not on file  . Highest education level: Not on file  Occupational History  . Not on file  Social Needs  . Financial resource strain: Not very hard  . Food insecurity    Worry: Never true    Inability: Never true  . Transportation needs    Medical: No    Non-medical: No  Tobacco Use  . Smoking status: Never Smoker  . Smokeless tobacco: Never Used  Substance and Sexual Activity  . Alcohol use: No    Alcohol/week: 0.0 standard drinks  . Drug use: No  . Sexual activity: Yes  Lifestyle  . Physical activity    Days per week: 0 days    Minutes per session: 0 min  . Stress: Not at all  Relationships  . Social Herbalist on phone: Three times a week    Gets together: Three times a week    Attends religious service: Never    Active member of club or organization: No    Attends meetings of clubs or organizations: Never    Relationship status: Never married  . Intimate partner violence    Fear of current or ex partner: No    Emotionally abused: No    Physically abused: No    Forced sexual activity: No  Other Topics Concern  . Not on file  Social History Narrative   Her grown daughter lives with her and her sons live in a house behind her      Current Outpatient Medications:  .  acetaminophen (TYLENOL) 500 MG tablet, Take 1 tablet (500 mg total) by mouth every 6 (six) hours as needed., Disp: 90 tablet, Rfl: 0 .  allopurinol (ZYLOPRIM) 300 MG tablet, TAKE 1 TABLET(300 MG) BY MOUTH TWICE DAILY, Disp: 180 tablet, Rfl: 1 .  amLODipine-olmesartan (AZOR) 5-40 MG tablet, TAKE 1 TABLET BY MOUTH DAILY, Disp: 90 tablet, Rfl: 1 .   aspirin 81 MG chewable tablet, Chew 81 mg by mouth daily., Disp: , Rfl:  .  atorvastatin (LIPITOR) 40 MG tablet, Take 1 tablet (40 mg total) by mouth daily., Disp: 90 tablet, Rfl: 1 .  Cholecalciferol (VITAMIN D-1000 MAX ST) 1000 units tablet, Take by mouth., Disp: , Rfl:  .  Coenzyme Q10 (CO Q 10 PO), Take by mouth daily., Disp: , Rfl:  .  fluconazole (DIFLUCAN) 150 MG tablet, TAKE 1 TABLET BY MOUTH EVERY OTHER DAY AS NEEDED, Disp: 9 tablet, Rfl: 0 .  fluticasone (FLONASE) 50 MCG/ACT nasal spray, SHAKE LIQUID AND USE 2  SPRAYS IN EACH NOSTRIL DAILY, Disp: 48 g, Rfl: 0 .  glucose blood (FREESTYLE LITE) test strip, Check blood sugar once daily and 2-3 times a day as needed., Disp: 100 each, Rfl: 12 .  Lancets (FREESTYLE) lancets, Check blood sugar once daily and 2-3 times a day as needed., Disp: 100 each, Rfl: 3 .  levocetirizine (XYZAL) 5 MG tablet, TAKE 1 TABLET(5 MG) BY MOUTH DAILY, Disp: 90 tablet, Rfl: 1 .  montelukast (SINGULAIR) 10 MG tablet, TAKE 1 TABLET(10 MG) BY MOUTH DAILY, Disp: 90 tablet, Rfl: 1 .  omega-3 acid ethyl esters (LOVAZA) 1 g capsule, TAKE 2 CAPSULES BY MOUTH TWICE DAILY, Disp: 360 capsule, Rfl: 1 .  polyethylene glycol powder (GLYCOLAX/MIRALAX) powder, Take 17 g by mouth daily as needed for mild constipation or moderate constipation., Disp: 850 g, Rfl: 0 .  pregabalin (LYRICA) 150 MG capsule, Take 1 capsule (150 mg total) by mouth 2 (two) times daily., Disp: 180 capsule, Rfl: 1 .  Semaglutide, 1 MG/DOSE, (OZEMPIC, 1 MG/DOSE,) 2 MG/1.5ML SOPN, Inject 1 mg into the skin once a week., Disp: 9 mL, Rfl: 1 .  SYNJARDY XR 25-1000 MG TB24, TAKE 1 TABLET BY MOUTH DAILY, Disp: 90 tablet, Rfl: 1 .  traMADol (ULTRAM) 50 MG tablet, Take 1 tablet (50 mg total) by mouth every evening., Disp: 90 tablet, Rfl: 0 .  Vitamin D, Ergocalciferol, 50 MCG (2000 UT) CAPS, Take 1 capsule by mouth daily. (Patient not taking: Reported on 07/02/2019), Disp: 90 capsule, Rfl: 1  Allergies  Allergen  Reactions  . Ace Inhibitors Cough  . Lipofen [Fenofibrate] Other (See Comments)    Localized drug reaction, blister on back  . Penicillins Itching  . Sulfa Antibiotics Itching    I personally reviewed active problem list, medication list, allergies, family history, social history, health maintenance with the patient/caregiver today.   ROS  Ten systems reviewed and is negative except as mentioned in HPI   Objective  Virtual encounter, vitals not obtained.  Body mass index is 33.05 kg/m.  Physical Exam  Awake, alert and orietned   PHQ2/9: Depression screen [facility] 2/9 07/02/2019 02/28/2019 10/26/2018 05/02/2018 03/14/2018  Decreased Interest 0 0 0 0 0  Down, Depressed, Hopeless 0 0 0 0 0  PHQ - 2 Score 0 0 0 0 0  Altered sleeping 0 0 0 0 -  Tired, decreased energy 0 0 0 0 -  Change in appetite 0 0 0 0 -  Feeling bad or failure about yourself  0 0 0 0 -  Trouble concentrating 0 0 0 0 -  Moving slowly or fidgety/restless 0 0 0 0 -  Suicidal thoughts 0 0 0 0 -  PHQ-9 Score 0 0 0 0 -  Difficult doing work/chores Not difficult at all Not difficult at all - Not difficult at all -   PHQ-2/9 Result is negative.    Fall Risk: Fall Risk  07/02/2019 02/28/2019 10/26/2018 07/27/2018 06/05/2018  Falls in the past year? 0 0 0 0 No  Number falls in past yr: 0 0 0 0 -  Injury with Fall? 0 0 0 0 -     Assessment & Plan  1. Dyslipidemia associated with type 2 diabetes mellitus (HCC)  - Semaglutide, 1 MG/DOSE, (OZEMPIC, 1 MG/DOSE,) 2 MG/1.5ML SOPN; Inject 1 mg into the skin once a week.  Dispense: 9 mL; Refill: 1 - Empagliflozin-metFORMIN HCl ER (SYNJARDY XR) 25-1000 MG TB24; Take 1 tablet by mouth daily.  Dispense: 90 tablet; Refill: 1 -  atorvastatin (LIPITOR) 40 MG tablet; Take 1 tablet (40 mg total) by mouth daily.  Dispense: 90 tablet; Refill: 1 - Blood Glucose Monitoring Suppl (FREESTYLE LITE) DEVI; 1 each by Does not apply route daily.  Dispense: 1 each; Refill: 0 - glucose blood  (FREESTYLE LITE) test strip; Check blood sugar once daily and 2-3 times a day as needed.  Dispense: 100 each; Refill: 12 - Lancets (FREESTYLE) lancets; Check blood sugar once daily and 2-3 times a day as needed.  Dispense: 100 each; Refill: 3 - Lipid panel - Hemoglobin A1c  2. Primary osteoarthritis of right hip  - traMADol (ULTRAM) 50 MG tablet; Take 1 tablet (50 mg total) by mouth every evening.  Dispense: 90 tablet; Refill: 0  3. Low back pain radiating to left lower extremity  - traMADol (ULTRAM) 50 MG tablet; Take 1 tablet (50 mg total) by mouth every evening.  Dispense: 90 tablet; Refill: 0  4. Herniated intervertebral disc of lumbar spine  - pregabalin (LYRICA) 200 MG capsule; Take 1 capsule (200 mg total) by mouth 2 (two) times daily.  Dispense: 180 capsule; Refill: 1  5. Diabetes mellitus type 2 in obese (HCC)  - Semaglutide, 1 MG/DOSE, (OZEMPIC, 1 MG/DOSE,) 2 MG/1.5ML SOPN; Inject 1 mg into the skin once a week.  Dispense: 9 mL; Refill: 1 - Empagliflozin-metFORMIN HCl ER (SYNJARDY XR) 25-1000 MG TB24; Take 1 tablet by mouth daily.  Dispense: 90 tablet; Refill: 1 - atorvastatin (LIPITOR) 40 MG tablet; Take 1 tablet (40 mg total) by mouth daily.  Dispense: 90 tablet; Refill: 1 - Blood Glucose Monitoring Suppl (FREESTYLE LITE) DEVI; 1 each by Does not apply route daily.  Dispense: 1 each; Refill: 0 - glucose blood (FREESTYLE LITE) test strip; Check blood sugar once daily and 2-3 times a day as needed.  Dispense: 100 each; Refill: 12 - Lancets (FREESTYLE) lancets; Check blood sugar once daily and 2-3 times a day as needed.  Dispense: 100 each; Refill: 3 - Lipid panel - Hemoglobin A1c - Microalbumin / creatinine urine ratio  6. Perennial allergic rhinitis  - montelukast (SINGULAIR) 10 MG tablet; Take 1 tablet (10 mg total) by mouth at bedtime.  Dispense: 90 tablet; Refill: 1  7. Dyslipidemia  - montelukast (SINGULAIR) 10 MG tablet; Take 1 tablet (10 mg total) by mouth at  bedtime.  Dispense: 90 tablet; Refill: 1 - atorvastatin (LIPITOR) 40 MG tablet; Take 1 tablet (40 mg total) by mouth daily.  Dispense: 90 tablet; Refill: 1  8. Hypertension, benign  - CBC with Differential/Platelet - Comprehensive metabolic panel  9. Controlled gout   10. Vitamin D deficiency  Check vitamin D   11. Yeast vaginitis  - fluconazole (DIFLUCAN) 150 MG tablet; TAKE 1 TABLET BY MOUTH EVERY OTHER DAY AS NEEDED  Dispense: 9 tablet; Refill: 0 - VITAMIN D 25 Hydroxy (Vit-D Deficiency, Fractures) - VITAMIN D 25 Hydroxy (Vit-D Deficiency, Fractures) I discussed the assessment and treatment plan with the patient. The patient was provided an opportunity to ask questions and all were answered. The patient agreed with the plan and demonstrated an understanding of the instructions.  The patient was advised to call back or seek an in-person evaluation if the symptoms worsen or if the condition fails to improve as anticipated.  I provided 25  minutes of non-face-to-face time during this encounter.

## 2019-08-08 ENCOUNTER — Other Ambulatory Visit: Payer: Self-pay | Admitting: Family Medicine

## 2019-08-08 DIAGNOSIS — B373 Candidiasis of vulva and vagina: Secondary | ICD-10-CM

## 2019-08-08 DIAGNOSIS — B3731 Acute candidiasis of vulva and vagina: Secondary | ICD-10-CM

## 2019-08-08 NOTE — Telephone Encounter (Signed)
Requested medication (s) are due for refill today: no  Requested medication (s) are on the active medication list: yes  Last refill:  07/02/2019  Future visit scheduled:no  Notes to clinic:  Medication not assigned to a protocol, review manually   Requested Prescriptions  Pending Prescriptions Disp Refills   fluconazole (DIFLUCAN) 150 MG tablet [Pharmacy Med Name: FLUCONAZOLE 150MG  TABLETS] 9 tablet 0    Sig: TAKE 1 TABLET BY MOUTH EVERY OTHER DAY AS NEEDED      Off-Protocol Failed - 08/08/2019  8:52 AM      Failed - Medication not assigned to a protocol, review manually.      Passed - Valid encounter within last 12 months    Recent Outpatient Visits           1 month ago Dyslipidemia associated with type 2 diabetes mellitus [facility])   [facility] [place], Drue Stager, MD   5 months ago Dyslipidemia associated with type 2 diabetes mellitus [facility])   [facility] Steele Sizer, MD   9 months ago Controlled type 2 diabetes mellitus with microalbuminuria, without long-term current use of insulin [facility])   [facility] [place], Drue Stager, MD   1 year ago Controlled type 2 diabetes mellitus with microalbuminuria, without long-term current use of insulin [facility])   [facility] Steele Sizer, MD   1 year ago Vaginal itching   [place], NP

## 2019-09-03 ENCOUNTER — Ambulatory Visit (INDEPENDENT_AMBULATORY_CARE_PROVIDER_SITE_OTHER): Payer: 59 | Admitting: Family Medicine

## 2019-09-03 ENCOUNTER — Encounter: Payer: Self-pay | Admitting: Family Medicine

## 2019-09-03 ENCOUNTER — Other Ambulatory Visit: Payer: Self-pay

## 2019-09-03 VITALS — BP 130/70 | HR 99 | Temp 97.1°F | Resp 16 | Ht 67.0 in | Wt 211.8 lb

## 2019-09-03 DIAGNOSIS — E1169 Type 2 diabetes mellitus with other specified complication: Secondary | ICD-10-CM

## 2019-09-03 DIAGNOSIS — E785 Hyperlipidemia, unspecified: Secondary | ICD-10-CM

## 2019-09-03 DIAGNOSIS — M170 Bilateral primary osteoarthritis of knee: Secondary | ICD-10-CM | POA: Diagnosis not present

## 2019-09-03 DIAGNOSIS — M25551 Pain in right hip: Secondary | ICD-10-CM | POA: Diagnosis not present

## 2019-09-03 DIAGNOSIS — M109 Gout, unspecified: Secondary | ICD-10-CM

## 2019-09-03 DIAGNOSIS — M2550 Pain in unspecified joint: Secondary | ICD-10-CM

## 2019-09-03 DIAGNOSIS — I10 Essential (primary) hypertension: Secondary | ICD-10-CM

## 2019-09-03 DIAGNOSIS — G8929 Other chronic pain: Secondary | ICD-10-CM

## 2019-09-03 DIAGNOSIS — E559 Vitamin D deficiency, unspecified: Secondary | ICD-10-CM

## 2019-09-03 DIAGNOSIS — M25552 Pain in left hip: Secondary | ICD-10-CM

## 2019-09-03 MED ORDER — TRAMADOL HCL 50 MG PO TABS: 50.0000 mg | ORAL_TABLET | Freq: Two times a day (BID) | ORAL | 0 refills | Status: DC

## 2019-09-03 MED ORDER — OMEGA-3-ACID ETHYL ESTERS 1 G PO CAPS: 2.0000 | ORAL_CAPSULE | Freq: Two times a day (BID) | ORAL | 1 refills | Status: DC

## 2019-09-03 MED ORDER — BLOOD GLUCOSE METER KIT: PACK | 0 refills | Status: DC

## 2019-09-03 NOTE — Progress Notes (Signed)
Name: Maria Cruz   MRN: [id number]    DOB: May 11, 1956   Date:09/03/2019       Progress Note  Subjective  Chief Complaint  Chief Complaint  Patient presents with  . Diabetes  . Hypertension  . Hyperlipidemia  . Hip Pain    left hip x 2 weeks that is constant and shooting.    HPI  DM II with proteinuria: urine microelevated 05/2019and even higher in October2019,she is seeing  nephrologist continue ARB/CCB, Synjardi and Ozempic andhgbA1C6.8% Feb 2020 and was up to 7.3% in July 2020  , she will go to [facility] today to have repeat A1C and labs. She states she has been eating healthier - sugar free, more water with lemon, she is drinking diet sodas instead She denies polyphagia, polydipsia or polyuria. Glucose is checked occasionally , she needs a new kit   Hyperlipidemia: Doing well on Lovaza and atorvastatin - deniesmuscle aches.Continue medication last LDL was 73, triglycerides still elevated at 362, she will have labs done today   HTN: She is onAZOR for now ( Norvasc plus Benicar )Denies chest pain, palpitation or dizziness.BP is at goal   Gout: no recent episodes,taking Allopurinol daily,denies side effects.Unchanged   OA hip/ Sciatica: MRI done 2014and repeat 2019.She has mild limp when standing for a long time at work, she has not been back to Ortho lately, had MRIthat showed DDD lumbar spine ,she did not have steroid injections, taking Tylenol. Lyrica and Tramadol prn, pain is getting worse, she states having outer hip and groin pain, worse on left side, also bilateral knee pain and sometimes swelling of hand joints. She has been more stiff, pain is worse, 8/10 now   Patient Active Problem List   Diagnosis Date Noted  . Microcalcification of left breast on mammogram 05/03/2017  . Herniated intervertebral disc of lumbar spine 09/02/2016  . Spondylolisthesis of lumbar region 09/02/2016  . Controlled type 2 diabetes mellitus with microalbuminuria,  without long-term current use of insulin ([place]) 02/24/2015  . Vitamin D deficiency 02/24/2015  . Perennial allergic rhinitis 02/24/2015  . Hypercalcemia 02/24/2015  . GERD without esophagitis 02/24/2015  . Hypertension, benign 02/24/2015  . Controlled gout 02/24/2015  . Fatty liver 02/24/2015  . Callus of foot 02/24/2015  . Primary osteoarthritis of right hip 02/24/2015  . History of carpal tunnel syndrome 02/24/2015  . Obesity (BMI 30-39.9) 02/24/2015  . Umbilical hernia without obstruction or gangrene 02/24/2015  . Intermittent low back pain 02/24/2015  . History of colonic polyps 02/24/2015  . Dyslipidemia 02/24/2015    Past Surgical History:  Procedure Laterality Date  . ABDOMINAL HYSTERECTOMY  2012  . APPENDECTOMY  2012  . BREAST BIOPSY Right 07/12/2012   Negative  . BREAST BIOPSY Left 05/09/2017   Affirm Bx of two areas- path pending  . COLONOSCOPY  2009   Dr Allen Norris  . COLONOSCOPY WITH PROPOFOL N/A 07/27/2017   Procedure: COLONOSCOPY WITH PROPOFOL;  Surgeon: Robert Bellow, MD;  Location: [facility] ENDOSCOPY;  Service: Endoscopy;  Laterality: N/A;  . DILATION AND CURETTAGE OF UTERUS  2007  . TUBAL LIGATION      Family History  Problem Relation Age of Onset  . Multiple sclerosis Mother   . Heart attack Father   . Kidney failure Sister   . Heart attack Brother   . Diabetes Daughter   . CAD Sister   . Arthritis Sister        RA  . Lupus Sister   . Breast cancer  Neg Hx   . Colon cancer Neg Hx       Current Outpatient Medications:  .  acetaminophen (TYLENOL) 500 MG tablet, Take 1 tablet (500 mg total) by mouth every 6 (six) hours as needed., Disp: 90 tablet, Rfl: 0 .  allopurinol (ZYLOPRIM) 300 MG tablet, TAKE 1 TABLET(300 MG) BY MOUTH TWICE DAILY, Disp: 180 tablet, Rfl: 1 .  amLODipine-olmesartan (AZOR) 5-40 MG tablet, TAKE 1 TABLET BY MOUTH DAILY, Disp: 90 tablet, Rfl: 1 .  aspirin 81 MG chewable tablet, Chew 81 mg by mouth daily., Disp: , Rfl:  .  atorvastatin  (LIPITOR) 40 MG tablet, Take 1 tablet (40 mg total) by mouth daily., Disp: 90 tablet, Rfl: 1 .  Blood Glucose Monitoring Suppl (FREESTYLE LITE) DEVI, 1 each by Does not apply route daily., Disp: 1 each, Rfl: 0 .  Cholecalciferol (VITAMIN D-1000 MAX ST) 1000 units tablet, Take by mouth., Disp: , Rfl:  .  Coenzyme Q10 (CO Q 10 PO), Take by mouth daily., Disp: , Rfl:  .  Empagliflozin-metFORMIN HCl ER (SYNJARDY XR) 25-1000 MG TB24, Take 1 tablet by mouth daily., Disp: 90 tablet, Rfl: 1 .  fluconazole (DIFLUCAN) 150 MG tablet, TAKE 1 TABLET BY MOUTH EVERY OTHER DAY AS NEEDED, Disp: 9 tablet, Rfl: 0 .  fluticasone (FLONASE) 50 MCG/ACT nasal spray, SHAKE LIQUID AND USE 2 SPRAYS IN EACH NOSTRIL DAILY, Disp: 48 g, Rfl: 0 .  glucose blood (FREESTYLE LITE) test strip, Check blood sugar once daily and 2-3 times a day as needed., Disp: 100 each, Rfl: 12 .  Lancets (FREESTYLE) lancets, Check blood sugar once daily and 2-3 times a day as needed., Disp: 100 each, Rfl: 3 .  levocetirizine (XYZAL) 5 MG tablet, TAKE 1 TABLET(5 MG) BY MOUTH DAILY, Disp: 90 tablet, Rfl: 1 .  montelukast (SINGULAIR) 10 MG tablet, Take 1 tablet (10 mg total) by mouth at bedtime., Disp: 90 tablet, Rfl: 1 .  omega-3 acid ethyl esters (LOVAZA) 1 g capsule, Take 2 capsules (2 g total) by mouth 2 (two) times daily., Disp: 360 capsule, Rfl: 1 .  polyethylene glycol powder (GLYCOLAX/MIRALAX) powder, Take 17 g by mouth daily as needed for mild constipation or moderate constipation., Disp: 850 g, Rfl: 0 .  pregabalin (LYRICA) 200 MG capsule, Take 1 capsule (200 mg total) by mouth 2 (two) times daily., Disp: 180 capsule, Rfl: 1 .  Semaglutide, 1 MG/DOSE, (OZEMPIC, 1 MG/DOSE,) 2 MG/1.5ML SOPN, Inject 1 mg into the skin once a week., Disp: 9 mL, Rfl: 1 .  traMADol (ULTRAM) 50 MG tablet, Take 1 tablet (50 mg total) by mouth 2 (two) times daily. For chronic use, Disp: 60 tablet, Rfl: 0 .  Vitamin D, Ergocalciferol, 50 MCG (2000 UT) CAPS, Take 1 capsule  by mouth daily., Disp: 90 capsule, Rfl: 1 .  blood glucose meter kit and supplies, Dispense based on patient and insurance preference. Use up to four times daily as directed. (FOR ICD-10 E10.9, E11.9)., Disp: 1 each, Rfl: 0  Allergies  Allergen Reactions  . Ace Inhibitors Cough  . Lipofen [Fenofibrate] Other (See Comments)    Localized drug reaction, blister on back  . Penicillins Itching  . Sulfa Antibiotics Itching    I personally reviewed active problem list, medication list, allergies, family history, social history, health maintenance with the patient/caregiver today.   ROS  Constitutional: Negative for fever or weight change.  Respiratory: Negative for cough and shortness of breath.   Cardiovascular: Negative for chest pain or palpitations.  Gastrointestinal: Negative for abdominal pain, no bowel changes.  Musculoskeletal: Positive  for gait problem and joint swelling.  Skin: Negative for rash.  Neurological: Negative for dizziness or headache.  No other specific complaints in a complete review of systems (except as listed in HPI above).  Objective  Vitals:   09/03/19 1423  BP: 130/70  Pulse: 99  Resp: 16  Temp: (!) 97.1 F (36.2 C)  TempSrc: Temporal  SpO2: 98%  Weight: 211 lb 12.8 oz (96.1 kg)  Height: 5' 7"  (1.702 m)    Body mass index is 33.17 kg/m.  Physical Exam  Constitutional: Patient appears well-developed and well-nourished. Obese  No distress.  HEENT: head atraumatic, normocephalic, pupils equal and reactive to light Cardiovascular: Normal rate, regular rhythm and normal heart sounds.  No murmur heard. No BLE edema. Pulmonary/Chest: Effort normal and breath sounds normal. No respiratory distress. Muscular Skeletal: mild deformities  Abdominal: Soft.  There is no tenderness. Psychiatric: Patient has a normal mood and affect. behavior is normal. Judgment and thought content normal.  Diabetic Foot Exam: Diabetic Foot Exam - Simple   Simple Foot  Form Diabetic Foot exam was performed with the following findings: Yes 09/03/2019  3:11 PM  Visual Inspection See comments: Yes Sensation Testing Intact to touch and monofilament testing bilaterally: Yes Pulse Check Posterior Tibialis and Dorsalis pulse intact bilaterally: Yes Comments Thick toe nails       PHQ2/9: Depression screen [facility] 2/9 09/03/2019 07/02/2019 02/28/2019 10/26/2018 05/02/2018  Decreased Interest 0 0 0 0 0  Down, Depressed, Hopeless 0 0 0 0 0  PHQ - 2 Score 0 0 0 0 0  Altered sleeping 0 0 0 0 0  Tired, decreased energy 0 0 0 0 0  Change in appetite 0 0 0 0 0  Feeling bad or failure about yourself  0 0 0 0 0  Trouble concentrating 0 0 0 0 0  Moving slowly or fidgety/restless 0 0 0 0 0  Suicidal thoughts 0 0 0 0 0  PHQ-9 Score 0 0 0 0 0  Difficult doing work/chores - Not difficult at all Not difficult at all - Not difficult at all    phq 9 is negative   Fall Risk: Fall Risk  09/03/2019 07/02/2019 02/28/2019 10/26/2018 07/27/2018  Falls in the past year? 0 0 0 0 0  Number falls in past yr: 0 0 0 0 0  Injury with Fall? 0 0 0 0 0     Functional Status Survey: Is the patient deaf or have difficulty hearing?: No Does the patient have difficulty seeing, even when wearing glasses/contacts?: No Does the patient have difficulty concentrating, remembering, or making decisions?: No Does the patient have difficulty walking or climbing stairs?: Yes Does the patient have difficulty dressing or bathing?: No Does the patient have difficulty doing errands alone such as visiting a doctor's office or shopping?: No  Assessment & Plan  1. Chronic hip pain, bilateral  - traMADol (ULTRAM) 50 MG tablet; Take 1 tablet (50 mg total) by mouth 2 (two) times daily. For chronic use  Dispense: 60 tablet; Refill: 0 - Ambulatory referral to Orthopedic Surgery  2. Primary osteoarthritis of both knees  - traMADol (ULTRAM) 50 MG tablet; Take 1 tablet (50 mg total) by mouth 2 (two) times  daily. For chronic use  Dispense: 60 tablet; Refill: 0 - Ambulatory referral to Orthopedic Surgery  3. Dyslipidemia   4. Hypertension, benign  At goal   5. Controlled gout  6. Dyslipidemia associated with type 2 diabetes mellitus ([place])  - omega-3 acid ethyl esters (LOVAZA) 1 g capsule; Take 2 capsules (2 g total) by mouth 2 (two) times daily.  Dispense: 360 capsule; Refill: 1 - blood glucose meter kit and supplies; Dispense based on patient and insurance preference. Use up to four times daily as directed. (FOR ICD-10 E10.9, E11.9).  Dispense: 1 each; Refill: 0  7. Arthralgia of multiple joints  - ANA,IFA RA Diag Pnl w/rflx Tit/Patn - Sedimentation rate - C-reactive protein  8. Vitamin D deficiency

## 2019-09-05 LAB — MICROALBUMIN / CREATININE URINE RATIO
Creatinine, Urine: 68 mg/dL
Microalb/Creat Ratio: 720 mg/g creat — ABNORMAL HIGH (ref 0–29)
Microalbumin, Urine: 489.9 ug/mL

## 2019-09-05 LAB — CBC WITH DIFFERENTIAL/PLATELET
Basophils Absolute: 0 10*3/uL (ref 0.0–0.2)
Basos: 0 %
EOS (ABSOLUTE): 0.1 10*3/uL (ref 0.0–0.4)
Eos: 1 %
Hematocrit: 40.7 % (ref 34.0–46.6)
Hemoglobin: 14.3 g/dL (ref 11.1–15.9)
Immature Grans (Abs): 0 10*3/uL (ref 0.0–0.1)
Immature Granulocytes: 0 %
Lymphocytes Absolute: 3.2 10*3/uL — ABNORMAL HIGH (ref 0.7–3.1)
Lymphs: 55 %
MCH: 31.8 pg (ref 26.6–33.0)
MCHC: 35.1 g/dL (ref 31.5–35.7)
MCV: 90 fL (ref 79–97)
Monocytes Absolute: 0.4 10*3/uL (ref 0.1–0.9)
Monocytes: 7 %
Neutrophils Absolute: 2.2 10*3/uL (ref 1.4–7.0)
Neutrophils: 37 %
Platelets: 259 10*3/uL (ref 150–450)
RBC: 4.5 x10E6/uL (ref 3.77–5.28)
RDW: 13.1 % (ref 11.7–15.4)
WBC: 6 10*3/uL (ref 3.4–10.8)

## 2019-09-05 LAB — LIPID PANEL
Chol/HDL Ratio: 3.7 ratio (ref 0.0–4.4)
Cholesterol, Total: 179 mg/dL (ref 100–199)
HDL: 48 mg/dL (ref >39)
LDL Chol Calc (NIH): 97 mg/dL (ref 0–99)
Triglycerides: 199 mg/dL — ABNORMAL HIGH (ref 0–149)
VLDL Cholesterol Cal: 34 mg/dL (ref 5–40)

## 2019-09-05 LAB — COMPREHENSIVE METABOLIC PANEL
ALT: 34 IU/L — ABNORMAL HIGH (ref 0–32)
AST: 44 IU/L — ABNORMAL HIGH (ref 0–40)
Albumin/Globulin Ratio: 2.1 (ref 1.2–2.2)
Albumin: 5.1 g/dL — ABNORMAL HIGH (ref 3.8–4.8)
Alkaline Phosphatase: 99 IU/L (ref 39–117)
BUN/Creatinine Ratio: 19 (ref 12–28)
BUN: 15 mg/dL (ref 8–27)
Bilirubin Total: 0.4 mg/dL (ref 0.0–1.2)
CO2: 24 mmol/L (ref 20–29)
Calcium: 10.1 mg/dL (ref 8.7–10.3)
Chloride: 102 mmol/L (ref 96–106)
Creatinine, Ser: 0.8 mg/dL (ref 0.57–1.00)
GFR calc Af Amer: 91 mL/min/{1.73_m2} (ref >59)
GFR calc non Af Amer: 79 mL/min/{1.73_m2} (ref >59)
Globulin, Total: 2.4 g/dL (ref 1.5–4.5)
Glucose: 104 mg/dL — ABNORMAL HIGH (ref 65–99)
Potassium: 4 mmol/L (ref 3.5–5.2)
Sodium: 144 mmol/L (ref 134–144)
Total Protein: 7.5 g/dL (ref 6.0–8.5)

## 2019-09-05 LAB — ANA,IFA RA DIAG PNL W/RFLX TIT/PATN
ANA Titer 1: NEGATIVE
Cyclic Citrullin Peptide Ab: 17 units (ref 0–19)
Rheumatoid fact SerPl-aCnc: 11.4 IU/mL (ref 0.0–13.9)

## 2019-09-05 LAB — HEMOGLOBIN A1C
Est. average glucose Bld gHb Est-mCnc: 160 mg/dL
Hgb A1c MFr Bld: 7.2 % — ABNORMAL HIGH (ref 4.8–5.6)

## 2019-09-05 LAB — C-REACTIVE PROTEIN: CRP: 4 mg/L (ref 0–10)

## 2019-09-05 LAB — VITAMIN D 25 HYDROXY (VIT D DEFICIENCY, FRACTURES): Vit D, 25-Hydroxy: 45.4 ng/mL (ref 30.0–100.0)

## 2019-09-05 LAB — SEDIMENTATION RATE: Sed Rate: 15 mm/h (ref 0–40)

## 2019-10-10 DIAGNOSIS — R809 Proteinuria, unspecified: Secondary | ICD-10-CM | POA: Insufficient documentation

## 2019-10-30 ENCOUNTER — Other Ambulatory Visit: Payer: Self-pay | Admitting: Family Medicine

## 2019-10-30 DIAGNOSIS — J3089 Other allergic rhinitis: Secondary | ICD-10-CM

## 2019-10-30 NOTE — Telephone Encounter (Signed)
Requested Prescriptions  Pending Prescriptions Disp Refills  . levocetirizine (XYZAL) 5 MG tablet [Pharmacy Med Name: LEVOCETIRIZINE 5MG  TABLETS] 90 tablet 2    Sig: TAKE 1 TABLET(5 MG) BY MOUTH DAILY     Ear, Nose, and Throat:  Antihistamines Passed - 10/30/2019  3:22 AM      Passed - Valid encounter within last 12 months    Recent Outpatient Visits          1 month ago Chronic hip pain, bilateral   [facility] [place], Drue Stager, MD   4 months ago Dyslipidemia associated with type 2 diabetes mellitus [facility])   [facility] [place], Drue Stager, MD   8 months ago Dyslipidemia associated with type 2 diabetes mellitus [facility])   [facility] [place], Drue Stager, MD   1 year ago Controlled type 2 diabetes mellitus with microalbuminuria, without long-term current use of insulin [facility])   [facility] [place], Drue Stager, MD   1 year ago Controlled type 2 diabetes mellitus with microalbuminuria, without long-term current use of insulin [facility])   [facility] Steele Sizer, MD      Future Appointments            In 1 month Ancil Boozer, Drue Stager, MD [facility], [facility]

## 2019-11-03 ENCOUNTER — Other Ambulatory Visit: Payer: Self-pay | Admitting: Family Medicine

## 2019-11-03 DIAGNOSIS — E1169 Type 2 diabetes mellitus with other specified complication: Secondary | ICD-10-CM

## 2019-11-03 DIAGNOSIS — B373 Candidiasis of vulva and vagina: Secondary | ICD-10-CM

## 2019-11-03 DIAGNOSIS — B3731 Acute candidiasis of vulva and vagina: Secondary | ICD-10-CM

## 2019-11-03 NOTE — Telephone Encounter (Signed)
Requested medications are due for refill today? Off Protocol Medication.  Requested medications are on active medication list?  Yes   Last Refill:  08/09/2019   # 9 with no refills   Future visit scheduled?  Yes  Notes to Clinic:  Medication not assigned to a protocol.

## 2019-11-04 ENCOUNTER — Other Ambulatory Visit: Payer: Self-pay | Admitting: Family Medicine

## 2019-11-04 DIAGNOSIS — M109 Gout, unspecified: Secondary | ICD-10-CM

## 2019-11-04 NOTE — Telephone Encounter (Signed)
Requested Prescriptions  Pending Prescriptions Disp Refills  . allopurinol (ZYLOPRIM) 300 MG tablet [Pharmacy Med Name: ALLOPURINOL 300MG  TABLETS] 180 tablet 1    Sig: TAKE 1 TABLET(300 MG) BY MOUTH TWICE DAILY     Endocrinology:  Gout Agents Failed - 11/04/2019  3:25 AM      Failed - Uric Acid in normal range and within 360 days    Uric Acid  Date Value Ref Range Status  12/08/2017 3.4 2.5 - 7.1 mg/dL Final    Comment:               Therapeutic target for gout patients: <6.0         Passed - Cr in normal range and within 360 days    Creatinine, Ser  Date Value Ref Range Status  09/03/2019 0.80 0.57 - 1.00 mg/dL Final   Creatinine, Urine  Date Value Ref Range Status  06/05/2018 63 20 - 275 mg/dL Final         Passed - Valid encounter within last 12 months    Recent Outpatient Visits          2 months ago Chronic hip pain, bilateral   [facility] [place], Drue Stager, MD   4 months ago Dyslipidemia associated with type 2 diabetes mellitus [facility] [place])   [facility] Steele Sizer, MD   8 months ago Dyslipidemia associated with type 2 diabetes mellitus [facility])   [facility] Steele Sizer, MD   1 year ago Controlled type 2 diabetes mellitus with microalbuminuria, without long-term current use of insulin [facility])   [facility] [place], Drue Stager, MD   1 year ago Controlled type 2 diabetes mellitus with microalbuminuria, without long-term current use of insulin [facility])   [facility] Steele Sizer, MD      Future Appointments            In 1 month Ancil Boozer, Drue Stager, MD [facility], [facility]

## 2019-11-08 HISTORY — PX: REPLACEMENT TOTAL HIP W/  RESURFACING IMPLANTS: SUR1222

## 2019-11-16 ENCOUNTER — Other Ambulatory Visit: Payer: Self-pay | Admitting: Family Medicine

## 2019-11-16 DIAGNOSIS — I10 Essential (primary) hypertension: Secondary | ICD-10-CM

## 2019-11-30 LAB — CBC WITH DIFFERENTIAL/PLATELET
Basophils Absolute: 0.1 10*3/uL (ref 0.0–0.2)
Basos: 1 %
EOS (ABSOLUTE): 0.1 10*3/uL (ref 0.0–0.4)
Eos: 2 %
Hematocrit: 43.6 % (ref 34.0–46.6)
Hemoglobin: 15.1 g/dL (ref 11.1–15.9)
Immature Grans (Abs): 0 10*3/uL (ref 0.0–0.1)
Immature Granulocytes: 0 %
Lymphocytes Absolute: 3.6 10*3/uL — ABNORMAL HIGH (ref 0.7–3.1)
Lymphs: 57 %
MCH: 31.8 pg (ref 26.6–33.0)
MCHC: 34.6 g/dL (ref 31.5–35.7)
MCV: 92 fL (ref 79–97)
Monocytes Absolute: 0.5 10*3/uL (ref 0.1–0.9)
Monocytes: 7 %
Neutrophils Absolute: 2.1 10*3/uL (ref 1.4–7.0)
Neutrophils: 33 %
Platelets: 280 10*3/uL (ref 150–450)
RBC: 4.75 x10E6/uL (ref 3.77–5.28)
RDW: 12.9 % (ref 11.7–15.4)
WBC: 6.3 10*3/uL (ref 3.4–10.8)

## 2019-11-30 LAB — COMPREHENSIVE METABOLIC PANEL
ALT: 32 IU/L (ref 0–32)
AST: 35 IU/L (ref 0–40)
Albumin/Globulin Ratio: 1.9 (ref 1.2–2.2)
Albumin: 5.1 g/dL — ABNORMAL HIGH (ref 3.8–4.8)
Alkaline Phosphatase: 115 IU/L (ref 39–117)
BUN/Creatinine Ratio: 20 (ref 12–28)
BUN: 12 mg/dL (ref 8–27)
Bilirubin Total: 0.4 mg/dL (ref 0.0–1.2)
CO2: 25 mmol/L (ref 20–29)
Calcium: 10.7 mg/dL — ABNORMAL HIGH (ref 8.7–10.3)
Chloride: 102 mmol/L (ref 96–106)
Creatinine, Ser: 0.59 mg/dL (ref 0.57–1.00)
GFR calc Af Amer: 114 mL/min/{1.73_m2} (ref >59)
GFR calc non Af Amer: 99 mL/min/{1.73_m2} (ref >59)
Globulin, Total: 2.7 g/dL (ref 1.5–4.5)
Glucose: 98 mg/dL (ref 65–99)
Potassium: 4.3 mmol/L (ref 3.5–5.2)
Sodium: 143 mmol/L (ref 134–144)
Total Protein: 7.8 g/dL (ref 6.0–8.5)

## 2019-11-30 LAB — LIPID PANEL
Chol/HDL Ratio: 3.6 ratio (ref 0.0–4.4)
Cholesterol, Total: 171 mg/dL (ref 100–199)
HDL: 48 mg/dL (ref >39)
LDL Chol Calc (NIH): 82 mg/dL (ref 0–99)
Triglycerides: 249 mg/dL — ABNORMAL HIGH (ref 0–149)
VLDL Cholesterol Cal: 41 mg/dL — ABNORMAL HIGH (ref 5–40)

## 2019-11-30 LAB — MICROALBUMIN / CREATININE URINE RATIO
Creatinine, Urine: 83.2 mg/dL
Microalb/Creat Ratio: 1531 mg/g creat — ABNORMAL HIGH (ref 0–29)
Microalbumin, Urine: 1273.6 ug/mL

## 2019-11-30 LAB — HEMOGLOBIN A1C
Est. average glucose Bld gHb Est-mCnc: 151 mg/dL
Hgb A1c MFr Bld: 6.9 % — ABNORMAL HIGH (ref 4.8–5.6)

## 2019-11-30 LAB — VITAMIN D 25 HYDROXY (VIT D DEFICIENCY, FRACTURES): Vit D, 25-Hydroxy: 43.3 ng/mL (ref 30.0–100.0)

## 2019-12-05 DIAGNOSIS — Z96642 Presence of left artificial hip joint: Secondary | ICD-10-CM

## 2019-12-05 HISTORY — DX: Presence of left artificial hip joint: Z96.642

## 2019-12-18 DIAGNOSIS — Z96649 Presence of unspecified artificial hip joint: Secondary | ICD-10-CM | POA: Insufficient documentation

## 2019-12-26 ENCOUNTER — Other Ambulatory Visit: Payer: Self-pay

## 2019-12-26 ENCOUNTER — Ambulatory Visit (INDEPENDENT_AMBULATORY_CARE_PROVIDER_SITE_OTHER): Payer: 59 | Admitting: Family Medicine

## 2019-12-26 ENCOUNTER — Encounter: Payer: Self-pay | Admitting: Family Medicine

## 2019-12-26 VITALS — BP 128/80 | HR 100 | Temp 97.6°F | Resp 18 | Ht 67.0 in | Wt 212.6 lb

## 2019-12-26 DIAGNOSIS — E1169 Type 2 diabetes mellitus with other specified complication: Secondary | ICD-10-CM

## 2019-12-26 DIAGNOSIS — M5126 Other intervertebral disc displacement, lumbar region: Secondary | ICD-10-CM

## 2019-12-26 DIAGNOSIS — I10 Essential (primary) hypertension: Secondary | ICD-10-CM

## 2019-12-26 DIAGNOSIS — M8949 Other hypertrophic osteoarthropathy, multiple sites: Secondary | ICD-10-CM

## 2019-12-26 DIAGNOSIS — R809 Proteinuria, unspecified: Secondary | ICD-10-CM

## 2019-12-26 DIAGNOSIS — J3089 Other allergic rhinitis: Secondary | ICD-10-CM | POA: Diagnosis not present

## 2019-12-26 DIAGNOSIS — Z96642 Presence of left artificial hip joint: Secondary | ICD-10-CM

## 2019-12-26 DIAGNOSIS — M21949 Unspecified acquired deformity of hand, unspecified hand: Secondary | ICD-10-CM

## 2019-12-26 DIAGNOSIS — E669 Obesity, unspecified: Secondary | ICD-10-CM

## 2019-12-26 DIAGNOSIS — M159 Polyosteoarthritis, unspecified: Secondary | ICD-10-CM

## 2019-12-26 DIAGNOSIS — Z1231 Encounter for screening mammogram for malignant neoplasm of breast: Secondary | ICD-10-CM

## 2019-12-26 DIAGNOSIS — E785 Hyperlipidemia, unspecified: Secondary | ICD-10-CM | POA: Diagnosis not present

## 2019-12-26 MED ORDER — SYNJARDY XR 25-1000 MG PO TB24: 1.0000 | ORAL_TABLET | Freq: Every day | ORAL | 1 refills | Status: DC

## 2019-12-26 MED ORDER — OZEMPIC (1 MG/DOSE) 2 MG/1.5ML ~~LOC~~ SOPN: 1.0000 mg | PEN_INJECTOR | SUBCUTANEOUS | 1 refills | Status: DC

## 2019-12-26 MED ORDER — LEVOCETIRIZINE DIHYDROCHLORIDE 5 MG PO TABS: 5.0000 mg | ORAL_TABLET | Freq: Every evening | ORAL | 1 refills | Status: DC

## 2019-12-26 MED ORDER — BLOOD GLUCOSE METER KIT: PACK | 0 refills | Status: AC

## 2019-12-26 MED ORDER — MONTELUKAST SODIUM 10 MG PO TABS: 10.0000 mg | ORAL_TABLET | Freq: Every day | ORAL | 1 refills | Status: DC

## 2019-12-26 MED ORDER — PREGABALIN 200 MG PO CAPS: 200.0000 mg | ORAL_CAPSULE | Freq: Two times a day (BID) | ORAL | 1 refills | Status: DC

## 2019-12-26 MED ORDER — ROSUVASTATIN CALCIUM 40 MG PO TABS: 40.0000 mg | ORAL_TABLET | Freq: Every day | ORAL | 1 refills | Status: DC

## 2019-12-26 MED ORDER — DICLOFENAC SODIUM 1 % EX GEL: 2.0000 g | Freq: Four times a day (QID) | CUTANEOUS | 2 refills | Status: AC

## 2019-12-26 NOTE — Progress Notes (Signed)
Name: Maria Cruz   MRN: [id number]    DOB: 13-Jun-1956   Date:12/26/2019       Progress Note  Subjective  Chief Complaint  Chief Complaint  Patient presents with  . Hyperlipidemia    3 month follow up  . Hypertension  . Diabetes  . Hip Pain    follow up left hip replacement  . Hand Pain    pinky finger right hand     HPI  DM II with proteinuria: urine microhas been elevated since 2019, but very high 11/2019 at 1.531. She is under the care of Dr. Abigail Butts nephrologist . She is on  ARB/CCB, Synjardi and Ozempic andhgbA1C6.8% Feb 2020 andwasup to 7.3%in July 20207.2 % Jan 21 and April 6.9 %. She has dyslipidemia and takes Atorvastatin. She has obesity but weight has been stable. She denies polyphagia, polydipsia and polyuria.   Hyperlipidemia: Taking Lovaza and Atorvastatin, triglycerides  went up a little to 249, LDL above 70 . We will change from atorvastatin to crestor to get LDL at goal  HTN: She is onAZOR for now ( Norvasc plus Benicar )Denies chest pain, palpitation or dizziness.She has DM also and proteinuria, under the care of nephrologist we will continue current regiment   Gout: no recent episodes,taking Allopurinol daily,denies side effects.She can try going down to one daily and monitor for gout symptoms   Right 5 th finger pain: she states since surgery on 04/28 she has noticed pain, thickness of right  5th finger and some stiffness, Advised to discuss it with Dr. Harlow Mares   S/p left knee total replacement: had total left hip replacement on 12/05/2019 by Dr. Harlow Mares, she is getting PT, pain is still present at 7/10, worse when she is walking, she also has some tingling in the area. She states she already feels like it is better than prior to surgery     Patient Active Problem List   Diagnosis Date Noted  . Microcalcification of left breast on mammogram 05/03/2017  . Herniated intervertebral disc of lumbar spine 09/02/2016  . Spondylolisthesis of  lumbar region 09/02/2016  . Controlled type 2 diabetes mellitus with microalbuminuria, without long-term current use of insulin ([place]) 02/24/2015  . Vitamin D deficiency 02/24/2015  . Perennial allergic rhinitis 02/24/2015  . Hypercalcemia 02/24/2015  . GERD without esophagitis 02/24/2015  . Hypertension, benign 02/24/2015  . Controlled gout 02/24/2015  . Fatty liver 02/24/2015  . Callus of foot 02/24/2015  . Primary osteoarthritis of right hip 02/24/2015  . History of carpal tunnel syndrome 02/24/2015  . Obesity (BMI 30-39.9) 02/24/2015  . Umbilical hernia without obstruction or gangrene 02/24/2015  . Intermittent low back pain 02/24/2015  . History of colonic polyps 02/24/2015  . Dyslipidemia 02/24/2015    Past Surgical History:  Procedure Laterality Date  . ABDOMINAL HYSTERECTOMY  2012  . APPENDECTOMY  2012  . BREAST BIOPSY Right 07/12/2012   Negative  . BREAST BIOPSY Left 05/09/2017   Affirm Bx of two areas- path pending  . COLONOSCOPY  2009   Dr Allen Norris  . COLONOSCOPY WITH PROPOFOL N/A 07/27/2017   Procedure: COLONOSCOPY WITH PROPOFOL;  Surgeon: Robert Bellow, MD;  Location: [facility] ENDOSCOPY;  Service: Endoscopy;  Laterality: N/A;  . DILATION AND CURETTAGE OF UTERUS  2007  . TUBAL LIGATION      Family History  Problem Relation Age of Onset  . Multiple sclerosis Mother   . Heart attack Father   . Kidney failure Sister   . Heart attack  Brother   . Diabetes Daughter   . CAD Sister   . Arthritis Sister        RA  . Lupus Sister   . Breast cancer Neg Hx   . Colon cancer Neg Hx     Social History   Tobacco Use  . Smoking status: Never Smoker  . Smokeless tobacco: Never Used  Substance Use Topics  . Alcohol use: No    Alcohol/week: 0.0 standard drinks     Current Outpatient Medications:  .  acetaminophen (TYLENOL) 500 MG tablet, Take 1 tablet (500 mg total) by mouth every 6 (six) hours as needed., Disp: 90 tablet, Rfl: 0 .  allopurinol (ZYLOPRIM) 300 MG  tablet, TAKE 1 TABLET(300 MG) BY MOUTH TWICE DAILY, Disp: 180 tablet, Rfl: 3 .  amLODipine-olmesartan (AZOR) 5-40 MG tablet, TAKE 1 TABLET BY MOUTH DAILY, Disp: 90 tablet, Rfl: 1 .  aspirin 81 MG chewable tablet, Chew 81 mg by mouth daily., Disp: , Rfl:  .  atorvastatin (LIPITOR) 40 MG tablet, Take 1 tablet (40 mg total) by mouth daily., Disp: 90 tablet, Rfl: 1 .  blood glucose meter kit and supplies, Dispense based on patient and insurance preference. Use up to four times daily as directed. (FOR ICD-10 E10.9, E11.9)., Disp: 1 each, Rfl: 0 .  Blood Glucose Monitoring Suppl (FREESTYLE LITE) DEVI, 1 each by Does not apply route daily., Disp: 1 each, Rfl: 0 .  Cholecalciferol (VITAMIN D-1000 MAX ST) 1000 units tablet, Take by mouth., Disp: , Rfl:  .  Coenzyme Q10 (CO Q 10 PO), Take by mouth daily., Disp: , Rfl:  .  Empagliflozin-metFORMIN HCl ER (SYNJARDY XR) 25-1000 MG TB24, Take 1 tablet by mouth daily., Disp: 90 tablet, Rfl: 1 .  fluticasone (FLONASE) 50 MCG/ACT nasal spray, SHAKE LIQUID AND USE 2 SPRAYS IN EACH NOSTRIL DAILY, Disp: 48 g, Rfl: 0 .  levocetirizine (XYZAL) 5 MG tablet, TAKE 1 TABLET(5 MG) BY MOUTH DAILY, Disp: 90 tablet, Rfl: 2 .  montelukast (SINGULAIR) 10 MG tablet, Take 1 tablet (10 mg total) by mouth at bedtime., Disp: 90 tablet, Rfl: 1 .  omega-3 acid ethyl esters (LOVAZA) 1 g capsule, TAKE 2 CAPSULES BY MOUTH TWICE DAILY, Disp: 360 capsule, Rfl: 2 .  polyethylene glycol powder (GLYCOLAX/MIRALAX) powder, Take 17 g by mouth daily as needed for mild constipation or moderate constipation., Disp: 850 g, Rfl: 0 .  pregabalin (LYRICA) 200 MG capsule, Take 1 capsule (200 mg total) by mouth 2 (two) times daily., Disp: 180 capsule, Rfl: 1 .  Semaglutide, 1 MG/DOSE, (OZEMPIC, 1 MG/DOSE,) 2 MG/1.5ML SOPN, Inject 1 mg into the skin once a week., Disp: 9 mL, Rfl: 1 .  traMADol (ULTRAM) 50 MG tablet, Take 1 tablet (50 mg total) by mouth 2 (two) times daily. For chronic use, Disp: 60 tablet,  Rfl: 0 .  Vitamin D, Ergocalciferol, 50 MCG (2000 UT) CAPS, Take 1 capsule by mouth daily., Disp: 90 capsule, Rfl: 1 .  fluconazole (DIFLUCAN) 150 MG tablet, TAKE 1 TABLET BY MOUTH EVERY OTHER DAY AS NEEDED (Patient not taking: Reported on 12/26/2019), Disp: 9 tablet, Rfl: 0 .  glucose blood (FREESTYLE LITE) test strip, Check blood sugar once daily and 2-3 times a day as needed. (Patient not taking: Reported on 12/26/2019), Disp: 100 each, Rfl: 12 .  Lancets (FREESTYLE) lancets, Check blood sugar once daily and 2-3 times a day as needed. (Patient not taking: Reported on 12/26/2019), Disp: 100 each, Rfl: 3  Allergies  Allergen  Reactions  . Ace Inhibitors Cough  . Lipofen [Fenofibrate] Other (See Comments)    Localized drug reaction, blister on back  . Penicillins Itching  . Sulfa Antibiotics Itching    I personally reviewed active problem list, medication list, allergies, family history, social history, health maintenance with the patient/caregiver today.   ROS  Constitutional: Negative for fever or weight change.  Respiratory: Negative for cough and shortness of breath.   Cardiovascular: Negative for chest pain or palpitations.  Gastrointestinal: Negative for abdominal pain, no bowel changes.  Musculoskeletal: Positive  for gait problem, and has some right 5 th finger  joint swelling.  Skin: Negative for rash.  Neurological: Negative for dizziness or headache.  No other specific complaints in a complete review of systems (except as listed in HPI above).  Objective  Vitals:   12/26/19 0818  BP: 128/80  Pulse: 100  Resp: 18  Temp: 97.6 F (36.4 C)  TempSrc: Temporal  SpO2: 99%  Weight: 212 lb 9.6 oz (96.4 kg)  Height: 5' 7"  (1.702 m)    Body mass index is 33.3 kg/m.  Physical Exam  Constitutional: Patient appears well-developed and well-nourished. Obese  No distress.  HEENT: head atraumatic, normocephalic, pupils equal and reactive to light,  neck supple Cardiovascular:  Normal rate, regular rhythm and normal heart sounds.  No murmur heard. No BLE edema. Pulmonary/Chest: Effort normal and breath sounds normal. No respiratory distress. Abdominal: Soft.  There is no tenderness. Muscular Skeletal: right 5th finger has some deformity, on DIP joint, no erythema or increase in warmth , able to walk with mild limp  Psychiatric: Patient has a normal mood and affect. behavior is normal. Judgment and thought content normal.  Recent Results (from the past 2160 hour(s))  VITAMIN D 25 Hydroxy (Vit-D Deficiency, Fractures)     Status: None   Collection Time: 11/29/19  2:50 PM  Result Value Ref Range   Vit D, 25-Hydroxy 43.3 30.0 - 100.0 ng/mL    Comment: Vitamin D deficiency has been defined by the [place] practice guideline as a level of serum 25-OH vitamin D less than 20 ng/mL (1,2). The Endocrine Society went on to further define vitamin D insufficiency as a level between 21 and 29 ng/mL (2). 1. IOM (Institute of Medicine). 2010. Dietary reference    intakes for calcium and D. [place]: The    [employer]. 2. Holick MF, Binkley Foley, Bischoff-Ferrari HA, et al.    Evaluation, treatment, and prevention of vitamin D    deficiency: an Endocrine Society clinical practice    guideline. JCEM. 2011 Jul; 96(7):1911-30.   CBC with Differential/Platelet     Status: Abnormal   Collection Time: 11/29/19  2:50 PM  Result Value Ref Range   WBC 6.3 3.4 - 10.8 x10E3/uL   RBC 4.75 3.77 - 5.28 x10E6/uL   Hemoglobin 15.1 11.1 - 15.9 g/dL   Hematocrit 43.6 34.0 - 46.6 %   MCV 92 79 - 97 fL   MCH 31.8 26.6 - 33.0 pg   MCHC 34.6 31.5 - 35.7 g/dL   RDW 12.9 11.7 - 15.4 %   Platelets 280 150 - 450 x10E3/uL   Neutrophils 33 Not Estab. %   Lymphs 57 Not Estab. %   Monocytes 7 Not Estab. %   Eos 2 Not Estab. %   Basos 1 Not Estab. %   Neutrophils Absolute 2.1 1.4 - 7.0 x10E3/uL   Lymphocytes Absolute 3.6 (H) 0.7 -  3.1  x10E3/uL   Monocytes Absolute 0.5 0.1 - 0.9 x10E3/uL   EOS (ABSOLUTE) 0.1 0.0 - 0.4 x10E3/uL   Basophils Absolute 0.1 0.0 - 0.2 x10E3/uL   Immature Granulocytes 0 Not Estab. %   Immature Grans (Abs) 0.0 0.0 - 0.1 x10E3/uL  Comprehensive metabolic panel     Status: Abnormal   Collection Time: 11/29/19  2:50 PM  Result Value Ref Range   Glucose 98 65 - 99 mg/dL   BUN 12 8 - 27 mg/dL   Creatinine, Ser 0.59 0.57 - 1.00 mg/dL   GFR calc non Af Amer 99 >59 mL/min/1.73   GFR calc Af Amer 114 >59 mL/min/1.73   BUN/Creatinine Ratio 20 12 - 28   Sodium 143 134 - 144 mmol/L   Potassium 4.3 3.5 - 5.2 mmol/L   Chloride 102 96 - 106 mmol/L   CO2 25 20 - 29 mmol/L   Calcium 10.7 (H) 8.7 - 10.3 mg/dL   Total Protein 7.8 6.0 - 8.5 g/dL   Albumin 5.1 (H) 3.8 - 4.8 g/dL   Globulin, Total 2.7 1.5 - 4.5 g/dL   Albumin/Globulin Ratio 1.9 1.2 - 2.2   Bilirubin Total 0.4 0.0 - 1.2 mg/dL   Alkaline Phosphatase 115 39 - 117 IU/L   AST 35 0 - 40 IU/L   ALT 32 0 - 32 IU/L  Lipid panel     Status: Abnormal   Collection Time: 11/29/19  2:50 PM  Result Value Ref Range   Cholesterol, Total 171 100 - 199 mg/dL   Triglycerides 249 (H) 0 - 149 mg/dL   HDL 48 >39 mg/dL   VLDL Cholesterol Cal 41 (H) 5 - 40 mg/dL   LDL Chol Calc (NIH) 82 0 - 99 mg/dL   Chol/HDL Ratio 3.6 0.0 - 4.4 ratio    Comment:                                   T. Chol/HDL Ratio                                             Men  Women                               1/2 Avg.Risk  3.4    3.3                                   Avg.Risk  5.0    4.4                                2X Avg.Risk  9.6    7.1                                3X Avg.Risk 23.4   11.0   Microalbumin / creatinine urine ratio     Status: Abnormal   Collection Time: 11/29/19  2:50 PM  Result Value Ref Range   Creatinine, Urine 83.2 Not Estab. mg/dL   Microalbumin, Urine 1,273.6 Not Estab. ug/mL    Comment: Results  confirmed on dilution.    Microalb/Creat Ratio 1,531 (H)  0 - 29 mg/g creat    Comment:                        Normal:                0 -  29                        Moderately increased: 30 - 300                        Severely increased:       >300   Hemoglobin A1c     Status: Abnormal   Collection Time: 11/29/19  2:50 PM  Result Value Ref Range   Hgb A1c MFr Bld 6.9 (H) 4.8 - 5.6 %    Comment:          Prediabetes: 5.7 - 6.4          Diabetes: >6.4          Glycemic control for adults with diabetes: <7.0    Est. average glucose Bld gHb Est-mCnc 151 mg/dL     PHQ2/9: Depression screen [facility] 2/9 12/26/2019 09/03/2019 07/02/2019 02/28/2019 10/26/2018  Decreased Interest 0 0 0 0 0  Down, Depressed, Hopeless 0 0 0 0 0  PHQ - 2 Score 0 0 0 0 0  Altered sleeping 0 0 0 0 0  Tired, decreased energy 0 0 0 0 0  Change in appetite 0 0 0 0 0  Feeling bad or failure about yourself  0 0 0 0 0  Trouble concentrating 0 0 0 0 0  Moving slowly or fidgety/restless 0 0 0 0 0  Suicidal thoughts 0 0 0 0 0  PHQ-9 Score 0 0 0 0 0  Difficult doing work/chores Not difficult at all - Not difficult at all Not difficult at all -  Some recent data might be hidden    phq 9 is negative   Fall Risk: Fall Risk  12/26/2019 09/03/2019 07/02/2019 02/28/2019 10/26/2018  Falls in the past year? 0 0 0 0 0  Number falls in past yr: 0 0 0 0 0  Injury with Fall? 0 0 0 0 0  Follow up Falls evaluation completed - - - -     Functional Status Survey: Is the patient deaf or have difficulty hearing?: No Does the patient have difficulty seeing, even when wearing glasses/contacts?: No Does the patient have difficulty concentrating, remembering, or making decisions?: No Does the patient have difficulty walking or climbing stairs?: Yes Does the patient have difficulty dressing or bathing?: No Does the patient have difficulty doing errands alone such as visiting a doctor's office or shopping?: No    Assessment & Plan   1. Dyslipidemia associated with type 2 diabetes mellitus  (HCC)  - Semaglutide, 1 MG/DOSE, (OZEMPIC, 1 MG/DOSE,) 2 MG/1.5ML SOPN; Inject 1 mg into the skin once a week.  Dispense: 9 mL; Refill: 1 - Empagliflozin-metFORMIN HCl ER (SYNJARDY XR) 25-1000 MG TB24; Take 1 tablet by mouth daily.  Dispense: 90 tablet; Refill: 1 - rosuvastatin (CRESTOR) 40 MG tablet; Take 1 tablet (40 mg total) by mouth daily. In place of atorvastatin  Dispense: 90 tablet; Refill: 1 - blood glucose meter kit and supplies; Dispense based on patient and insurance preference. Use up to four times daily as directed. (FOR ICD-10  E10.9, E11.9). Check fsbs up to twice daily  Dispense: 1 each; Refill: 0  2. Diabetes mellitus type 2 in obese (HCC)  - Semaglutide, 1 MG/DOSE, (OZEMPIC, 1 MG/DOSE,) 2 MG/1.5ML SOPN; Inject 1 mg into the skin once a week.  Dispense: 9 mL; Refill: 1 - Empagliflozin-metFORMIN HCl ER (SYNJARDY XR) 25-1000 MG TB24; Take 1 tablet by mouth daily.  Dispense: 90 tablet; Refill: 1  3. Herniated intervertebral disc of lumbar spine  - pregabalin (LYRICA) 200 MG capsule; Take 1 capsule (200 mg total) by mouth 2 (two) times daily.  Dispense: 180 capsule; Refill: 1  4. Perennial allergic rhinitis  - montelukast (SINGULAIR) 10 MG tablet; Take 1 tablet (10 mg total) by mouth at bedtime.  Dispense: 90 tablet; Refill: 1 - levocetirizine (XYZAL) 5 MG tablet; Take 1 tablet (5 mg total) by mouth every evening.  Dispense: 90 tablet; Refill: 1  5. Dyslipidemia  - montelukast (SINGULAIR) 10 MG tablet; Take 1 tablet (10 mg total) by mouth at bedtime.  Dispense: 90 tablet; Refill: 1  6. Encounter for screening mammogram for malignant neoplasm of breast  Mammogram   7. Deformity of hand, unspecified laterality  Keep follow up with ortho   8. History of left hip replacement  Doing well, still has a limp   9. Albuminuria  Keep follow up with Dr. Abigail Butts, stop nsaid's  10. Hypertension, benign  At goal

## 2020-01-04 ENCOUNTER — Other Ambulatory Visit: Payer: Self-pay | Admitting: Family Medicine

## 2020-01-04 DIAGNOSIS — E1169 Type 2 diabetes mellitus with other specified complication: Secondary | ICD-10-CM

## 2020-01-14 ENCOUNTER — Other Ambulatory Visit: Payer: Self-pay | Admitting: Family Medicine

## 2020-01-14 NOTE — Telephone Encounter (Signed)
Passed protocol.  Requested Prescriptions  Pending Prescriptions Disp Refills  . ONETOUCH ULTRA test strip [employer] Med Name: [place] TESTST(NEW)100] 300 strip 1    Sig: TEST UP TO FOUR TIMES DAILY     Endocrinology: Diabetes - Testing Supplies Passed - 01/14/2020 12:31 PM      Passed - Valid encounter within last 12 months    Recent Outpatient Visits          2 weeks ago Dyslipidemia associated with type 2 diabetes mellitus [facility])   [facility] Steele Sizer, MD   4 months ago Chronic hip pain, bilateral   [facility] [place], Drue Stager, MD   6 months ago Dyslipidemia associated with type 2 diabetes mellitus [facility])   [facility] Steele Sizer, MD   10 months ago Dyslipidemia associated with type 2 diabetes mellitus [facility])   [facility] Steele Sizer, MD   1 year ago Controlled type 2 diabetes mellitus with microalbuminuria, without long-term current use of insulin [facility])   [facility] Steele Sizer, MD      Future Appointments            In 2 months Ancil Boozer, Drue Stager, MD [facility], [facility]

## 2020-01-30 ENCOUNTER — Telehealth: Payer: Self-pay

## 2020-01-30 NOTE — Telephone Encounter (Signed)
Prior Authorization was initiated for Maria Cruz [facility]) and Approved. The medication is approved through 01/28/2021.

## 2020-01-31 ENCOUNTER — Other Ambulatory Visit: Payer: Self-pay | Admitting: Family Medicine

## 2020-01-31 DIAGNOSIS — E669 Obesity, unspecified: Secondary | ICD-10-CM

## 2020-01-31 DIAGNOSIS — E785 Hyperlipidemia, unspecified: Secondary | ICD-10-CM

## 2020-02-05 ENCOUNTER — Other Ambulatory Visit: Payer: Self-pay | Admitting: Family Medicine

## 2020-02-05 ENCOUNTER — Encounter: Payer: Self-pay | Admitting: Family Medicine

## 2020-02-05 NOTE — Telephone Encounter (Signed)
Requested Prescriptions  Pending Prescriptions Disp Refills  . ONETOUCH ULTRA test strip [employer] Med Name: [place] TESTST(NEW)100] 300 strip 1    Sig: TEST UP TO FOUR TIMES DAILY     Endocrinology: Diabetes - Testing Supplies Passed - 02/05/2020  3:26 AM      Passed - Valid encounter within last 12 months    Recent Outpatient Visits          1 month ago Dyslipidemia associated with type 2 diabetes mellitus [facility])   [facility] Steele Sizer, MD   5 months ago Chronic hip pain, bilateral   [facility] [place], Drue Stager, MD   7 months ago Dyslipidemia associated with type 2 diabetes mellitus [facility])   [facility] Steele Sizer, MD   11 months ago Dyslipidemia associated with type 2 diabetes mellitus [facility])   [facility] [place], Drue Stager, MD   1 year ago Controlled type 2 diabetes mellitus with microalbuminuria, without long-term current use of insulin [facility])   [facility] Steele Sizer, MD      Future Appointments            In 1 month Ancil Boozer, Drue Stager, MD [facility], [place]           . Lancets (ONETOUCH DELICA PLUS [id number]) [place] [Pharmacy Med Name: ONE TOUCH DELICA PLUS 26O LANCETS] 100 each     Sig: TEST UP TO FOUR TIMES DAILY     Endocrinology: Diabetes - Testing Supplies Passed - 02/05/2020  3:26 AM      Passed - Valid encounter within last 12 months    Recent Outpatient Visits          1 month ago Dyslipidemia associated with type 2 diabetes mellitus [facility])   [facility] Steele Sizer, MD   5 months ago Chronic hip pain, bilateral   [facility] [place], Drue Stager, MD   7 months ago Dyslipidemia associated with type 2 diabetes mellitus [facility])   [facility] Steele Sizer, MD   11 months ago Dyslipidemia associated with type 2 diabetes mellitus [facility])   [facility]  [place], Drue Stager, MD   1 year ago Controlled type 2 diabetes mellitus with microalbuminuria, without long-term current use of insulin [facility])   [facility] Steele Sizer, MD      Future Appointments            In 1 month Ancil Boozer, Drue Stager, MD [facility], [facility]

## 2020-03-22 ENCOUNTER — Other Ambulatory Visit: Payer: Self-pay | Admitting: Family Medicine

## 2020-03-22 DIAGNOSIS — E785 Hyperlipidemia, unspecified: Secondary | ICD-10-CM

## 2020-03-22 DIAGNOSIS — J3089 Other allergic rhinitis: Secondary | ICD-10-CM

## 2020-03-24 ENCOUNTER — Other Ambulatory Visit: Payer: Self-pay | Admitting: Family Medicine

## 2020-03-24 DIAGNOSIS — B373 Candidiasis of vulva and vagina: Secondary | ICD-10-CM

## 2020-03-24 DIAGNOSIS — B3731 Acute candidiasis of vulva and vagina: Secondary | ICD-10-CM

## 2020-03-26 ENCOUNTER — Other Ambulatory Visit: Payer: Self-pay | Admitting: Family Medicine

## 2020-03-26 DIAGNOSIS — Z124 Encounter for screening for malignant neoplasm of cervix: Secondary | ICD-10-CM

## 2020-03-26 NOTE — Progress Notes (Addendum)
Name: Maria Cruz   MRN: [id number]    DOB: 12/05/1955   Date:03/27/2020       Progress Note  Subjective  Chief Complaint  Chief Complaint  Patient presents with  . Follow-up  . Diabetes  . Hand Pain    right/post op  . Incisional Pain    HPI   DM II with proteinuria: urine microhas been elevated since 2019, but very high 11/2019 at 1.531. She is under the care of Dr. Abigail Butts nephrologist . She is on  ARB/CCB, Synjardi and Ozempic andhgbA1C6.8% Feb 2020 andwasup to 7.3%in July 20207.2 % Jan 21 and April 6.9 % today is up to 8.1 % . She has dyslipidemia and takes Atorvastatin, she also has microalbuminuria in the urine at 1.531 - she is under the care of nephrologist . She has obesity but weight has been stable. She denies polyphagia, polydipsia and polyuria. She states her diet has not been good lately, she has been eating more cookies lately ( Vanilla Wafer)   Hyperlipidemia: Taking Lovaza and Atorvastatin, triglycerides  went up a little to 249, LDL above 70 . We changed from Atorvastatin to crestor on her last visit and we will recheck labs.   HTN: She is onAZOR for now ( Norvasc plus Benicar )Denies chest pain, palpitation or dizziness.She has DM also and proteinuria, under the care of nephrologist. BP is at goal   Gout: no recent episodes,taking Allopurinol daily,denies side effects.She can try going down to one daily and monitor for gout symptoms Unchanged   Right 5 th finger pain: she states since left hip surgery on 04/28 she has noticed pain, tingling and numbness, also  thickness of right  5th finger, stiffness and now also swelling, she is now seeing a hand sub-specialist Dr. Peggye Ley , she is getting OT , she had NCS that was normal She also has left shoulder pain, and glucose is out of control, we will check inflammatory arthritis . 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 She is right hand dominant , she is working 3 days a week but putting her gloves on is excruciating, she also states 5 th finger is getting worse with more pain and swelling, we will fill FMLA forms for her to be out of work until proper evaluation by neurologist and Rheumatologist   History of  left hip replacement on 12/05/2019 by Dr. Harlow Mares, she finished PT of left pain is still present at 7/10, pain is  worse when she is walking, she also has some tingling in the area. She has to get up slowly , she is unable to get up and go, gait is slow, she has been working only 3 days a week but struggling because of the pain   Patient Active Problem List   Diagnosis Date Noted  . Acute posthemorrhagic anemia 03/27/2020  . Hip pain 03/27/2020  . Osteoarthritis 03/27/2020  . Sinus tachycardia 03/27/2020  . History of total hip arthroplasty 12/18/2019  . Proteinuria, unspecified 10/10/2019  . Microcalcification of left breast on mammogram 05/03/2017  . Herniated intervertebral disc of lumbar spine 09/02/2016  . Spondylolisthesis of lumbar region 09/02/2016  . Controlled type 2 diabetes mellitus with microalbuminuria, without long-term current use of insulin ([place]) 02/24/2015  . Vitamin D deficiency 02/24/2015  . Perennial allergic rhinitis 02/24/2015  . Hypercalcemia 02/24/2015  . GERD without esophagitis  02/24/2015  . Hypertension, benign 02/24/2015  . Controlled gout 02/24/2015  . Fatty liver 02/24/2015  . Callus of foot 02/24/2015  . Primary osteoarthritis of right hip 02/24/2015  . History of carpal tunnel syndrome 02/24/2015  . Obesity (BMI 30-39.9) 02/24/2015  . Umbilical hernia without obstruction or gangrene 02/24/2015  . Intermittent low back pain 02/24/2015  . History of colonic polyps 02/24/2015  . Dyslipidemia 02/24/2015    Past Surgical History:  Procedure Laterality Date  . ABDOMINAL HYSTERECTOMY  2012  . APPENDECTOMY  2012  .  BREAST BIOPSY Right 07/12/2012   Negative  . BREAST BIOPSY Left 05/09/2017   Affirm Bx of two areas- path pending  . COLONOSCOPY  2009   Dr Allen Norris  . COLONOSCOPY WITH PROPOFOL N/A 07/27/2017   Procedure: COLONOSCOPY WITH PROPOFOL;  Surgeon: Robert Bellow, MD;  Location: [facility] ENDOSCOPY;  Service: Endoscopy;  Laterality: N/A;  . DILATION AND CURETTAGE OF UTERUS  2007  . TUBAL LIGATION      Family History  Problem Relation Age of Onset  . Multiple sclerosis Mother   . Heart attack Father   . Kidney failure Sister   . Heart attack Brother   . Diabetes Daughter   . CAD Sister   . Arthritis Sister        RA  . Lupus Sister   . Breast cancer Neg Hx   . Colon cancer Neg Hx     Social History   Tobacco Use  . Smoking status: Never Smoker  . Smokeless tobacco: Never Used  Substance Use Topics  . Alcohol use: No    Alcohol/week: 0.0 standard drinks     Current Outpatient Medications:  .  acetaminophen (TYLENOL) 500 MG tablet, Take 1 tablet (500 mg total) by mouth every 6 (six) hours as needed., Disp: 90 tablet, Rfl: 0 .  allopurinol (ZYLOPRIM) 300 MG tablet, TAKE 1 TABLET(300 MG) BY MOUTH TWICE DAILY, Disp: 180 tablet, Rfl: 3 .  amLODipine-olmesartan (AZOR) 5-40 MG tablet, TAKE 1 TABLET BY MOUTH DAILY, Disp: 90 tablet, Rfl: 1 .  aspirin 81 MG chewable tablet, Chew 81 mg by mouth daily., Disp: , Rfl:  .  blood glucose meter kit and supplies, Dispense based on patient and insurance preference. Use up to four times daily as directed. (FOR ICD-10 E10.9, E11.9). Check fsbs up to twice daily, Disp: 1 each, Rfl: 0 .  Blood Glucose Monitoring Suppl (FREESTYLE LITE) DEVI, 1 each by Does not apply route daily., Disp: 1 each, Rfl: 0 .  Cholecalciferol (VITAMIN D-1000 MAX ST) 1000 units tablet, Take by mouth., Disp: , Rfl:  .  Coenzyme Q10 (CO Q 10 PO), Take by mouth daily., Disp: , Rfl:  .  diclofenac Sodium (VOLTAREN) 1 % GEL, Apply 2 g topically 4 (four) times daily., Disp: 100 g, Rfl:  2 .  Empagliflozin-metFORMIN HCl ER (SYNJARDY XR) 25-1000 MG TB24, Take 1 tablet by mouth daily., Disp: 90 tablet, Rfl: 1 .  fluconazole (DIFLUCAN) 150 MG tablet, TAKE 1 TABLET BY MOUTH EVERY OTHER DAY AS NEEDED, Disp: 9 tablet, Rfl: 0 .  fluticasone (FLONASE) 50 MCG/ACT nasal spray, SHAKE LIQUID AND USE 2 SPRAYS IN EACH NOSTRIL DAILY, Disp: 48 g, Rfl: 0 .  Lancets (ONETOUCH DELICA PLUS [id number]) MISC, TEST UP TO FOUR TIMES DAILY, Disp: 100 each, Rfl: 11 .  levocetirizine (XYZAL) 5 MG tablet, Take 1 tablet (5 mg total) by mouth every evening., Disp: 90 tablet, Rfl: 1 .  montelukast (SINGULAIR) 10 MG  tablet, TAKE 1 TABLET(10 MG) BY MOUTH AT BEDTIME, Disp: 30 tablet, Rfl: 0 .  omega-3 acid ethyl esters (LOVAZA) 1 g capsule, TAKE 2 CAPSULES BY MOUTH TWICE DAILY, Disp: 360 capsule, Rfl: 2 .  ONETOUCH ULTRA test strip, TEST UP TO FOUR TIMES DAILY, Disp: 300 strip, Rfl: 1 .  OZEMPIC, 1 MG/DOSE, 4 MG/3ML SOPN, , Disp: , Rfl:  .  polyethylene glycol powder (GLYCOLAX/MIRALAX) powder, Take 17 g by mouth daily as needed for mild constipation or moderate constipation., Disp: 850 g, Rfl: 0 .  pregabalin (LYRICA) 200 MG capsule, Take 1 capsule (200 mg total) by mouth 2 (two) times daily., Disp: 180 capsule, Rfl: 1 .  rosuvastatin (CRESTOR) 40 MG tablet, Take 1 tablet (40 mg total) by mouth daily. In place of atorvastatin, Disp: 90 tablet, Rfl: 1 .  Semaglutide, 1 MG/DOSE, (OZEMPIC, 1 MG/DOSE,) 2 MG/1.5ML SOPN, Inject 1 mg into the skin once a week., Disp: 9 mL, Rfl: 1 .  Semaglutide, 1 MG/DOSE, (OZEMPIC, 1 MG/DOSE,) 4 MG/3ML SOPN, Ozempic 1 mg/dose (4 mg/3 mL) subcutaneous pen injector, Disp: , Rfl:  .  Vitamin D, Ergocalciferol, 50 MCG (2000 UT) CAPS, Take 1 capsule by mouth daily., Disp: 90 capsule, Rfl: 1  Allergies  Allergen Reactions  . Ace Inhibitors Cough  . Lipofen [Fenofibrate] Other (See Comments)    Localized drug reaction, blister on back  . Penicillins Itching  . Sulfa Antibiotics Itching     I personally reviewed active problem list, medication list, allergies, family history, social history, health maintenance with the patient/caregiver today.   ROS  Constitutional: Negative for fever or weight change.  Respiratory: Negative for cough and shortness of breath.   Cardiovascular: Negative for chest pain or palpitations.  Gastrointestinal: Negative for abdominal pain, no bowel changes.  Musculoskeletal: Postiive for gait problem and  joint swelling.  Skin: Negative for rash.  Neurological: Negative for dizziness or headache.  No other specific complaints in a complete review of systems (except as listed in HPI above).  Objective  Vitals:   03/27/20 0916  BP: 124/72  Pulse: 98  Resp: 14  Temp: 98.2 F (36.8 C)  TempSrc: Oral  SpO2: 99%  Weight: 212 lb 11.2 oz (96.5 kg)  Height: 5' 7"  (1.702 m)    Body mass index is 33.31 kg/m.  Physical Exam  Constitutional: Patient appears well-developed and well-nourished. Obese  No distress.  HEENT: head atraumatic, normocephalic, pupils equal and reactive to light,neck supple Cardiovascular: Normal rate, regular rhythm and normal heart sounds.  No murmur heard. No BLE edema. Pulmonary/Chest: Effort normal and breath sounds normal. No respiratory distress. Abdominal: Soft.  There is no tenderness. Muscular skeletal: antalgic gait, swelling on left lateral hand and also pain to touch, decrease rom of fingers.  Psychiatric: Patient has a normal mood and affect. behavior is normal. Judgment and thought content normal.  Recent Results (from the past 2160 hour(s))  POCT HgB A1C     Status: Abnormal   Collection Time: 03/27/20  9:22 AM  Result Value Ref Range   Hemoglobin A1C 8.1 (A) 4.0 - 5.6 %   HbA1c POC (<> result, manual entry)     HbA1c, POC (prediabetic range)     HbA1c, POC (controlled diabetic range)       PHQ2/9: Depression screen [facility] 2/9 03/27/2020 12/26/2019 09/03/2019 07/02/2019 02/28/2019  Decreased  Interest 0 0 0 0 0  Down, Depressed, Hopeless 0 0 0 0 0  PHQ - 2 Score 0 0 0  0 0  Altered sleeping 0 0 0 0 0  Tired, decreased energy 0 0 0 0 0  Change in appetite 1 0 0 0 0  Feeling bad or failure about yourself  0 0 0 0 0  Trouble concentrating 0 0 0 0 0  Moving slowly or fidgety/restless 0 0 0 0 0  Suicidal thoughts 0 0 0 0 0  PHQ-9 Score 1 0 0 0 0  Difficult doing work/chores Somewhat difficult Not difficult at all - Not difficult at all Not difficult at all  Some recent data might be hidden    phq 9 is negative   Fall Risk: Fall Risk  03/27/2020 12/26/2019 09/03/2019 07/02/2019 02/28/2019  Falls in the past year? 0 0 0 0 0  Number falls in past yr: 0 0 0 0 0  Injury with Fall? 0 0 0 0 0  Follow up - Falls evaluation completed - - -    Functional Status Survey: Is the patient deaf or have difficulty hearing?: Yes Does the patient have difficulty seeing, even when wearing glasses/contacts?: Yes Does the patient have difficulty concentrating, remembering, or making decisions?: Yes Does the patient have difficulty walking or climbing stairs?: Yes Does the patient have difficulty dressing or bathing?: No Does the patient have difficulty doing errands alone such as visiting a doctor's office or shopping?: No    Assessment & Plan  1. Diabetes mellitus type 2 in obese (HCC)  - POCT HgB A1C  2. Localized swelling on left hand  - ANA,IFA RA Diag Pnl w/rflx Tit/Patn - C-reactive protein - Sedimentation rate - CBC with Differential/Platelet - Ambulatory referral to Rheumatology  Not able to go back to work at this time, still getting OT for right hand pain and swelling, unable to make a fist or use her 4th and 5 th fingers of right hand and has constant pain and swelling , pending evaluation by neurologist and is going to ask to see Dr. Sabra Heck instead of Dr. Peggye Ley at [facility]   3. Arthralgia, unspecified joint  - ANA,IFA RA Diag Pnl w/rflx Tit/Patn - C-reactive  protein - Sedimentation rate - CBC with Differential/Platelet - Ambulatory referral to Rheumatology  4. Albuminuria  Go back nephrologist   5. Dyslipidemia  - Lipid panel  6. Dyslipidemia associated with type 2 diabetes mellitus (HCC)  - Lipid panel  Increase dose of Metformin to get A1C to goal  7. Hypertension, benign  - Comprehensive Metabolic Panel (CMET)

## 2020-03-27 ENCOUNTER — Ambulatory Visit: Payer: 59 | Admitting: Family Medicine

## 2020-03-27 ENCOUNTER — Encounter: Payer: Self-pay | Admitting: Family Medicine

## 2020-03-27 ENCOUNTER — Other Ambulatory Visit: Payer: Self-pay

## 2020-03-27 VITALS — BP 124/72 | HR 98 | Temp 98.2°F | Resp 14 | Ht 67.0 in | Wt 212.7 lb

## 2020-03-27 DIAGNOSIS — R2232 Localized swelling, mass and lump, left upper limb: Secondary | ICD-10-CM

## 2020-03-27 DIAGNOSIS — E669 Obesity, unspecified: Secondary | ICD-10-CM | POA: Diagnosis not present

## 2020-03-27 DIAGNOSIS — E1169 Type 2 diabetes mellitus with other specified complication: Secondary | ICD-10-CM | POA: Diagnosis not present

## 2020-03-27 DIAGNOSIS — D62 Acute posthemorrhagic anemia: Secondary | ICD-10-CM | POA: Insufficient documentation

## 2020-03-27 DIAGNOSIS — I10 Essential (primary) hypertension: Secondary | ICD-10-CM

## 2020-03-27 DIAGNOSIS — M25559 Pain in unspecified hip: Secondary | ICD-10-CM | POA: Insufficient documentation

## 2020-03-27 DIAGNOSIS — E785 Hyperlipidemia, unspecified: Secondary | ICD-10-CM

## 2020-03-27 DIAGNOSIS — M1990 Unspecified osteoarthritis, unspecified site: Secondary | ICD-10-CM | POA: Insufficient documentation

## 2020-03-27 DIAGNOSIS — M2550 Pain in unspecified joint: Secondary | ICD-10-CM

## 2020-03-27 DIAGNOSIS — R809 Proteinuria, unspecified: Secondary | ICD-10-CM | POA: Diagnosis not present

## 2020-03-27 DIAGNOSIS — R000 Tachycardia, unspecified: Secondary | ICD-10-CM | POA: Insufficient documentation

## 2020-03-27 LAB — POCT GLYCOSYLATED HEMOGLOBIN (HGB A1C): Hemoglobin A1C: 8.1 % — AB (ref 4.0–5.6)

## 2020-03-27 MED ORDER — OZEMPIC (1 MG/DOSE) 4 MG/3ML ~~LOC~~ SOPN: 1.0000 mg | PEN_INJECTOR | Freq: Every day | SUBCUTANEOUS | 1 refills | Status: DC

## 2020-03-27 MED ORDER — AMLODIPINE-OLMESARTAN 5-40 MG PO TABS: 1.0000 | ORAL_TABLET | Freq: Every day | ORAL | 1 refills | Status: DC

## 2020-03-27 MED ORDER — SYNJARDY XR 12.5-1000 MG PO TB24: 2.0000 | ORAL_TABLET | Freq: Every day | ORAL | 1 refills | Status: DC

## 2020-03-27 NOTE — Patient Instructions (Signed)

## 2020-03-27 NOTE — Addendum Note (Signed)
Addended by: Steele Sizer F on: 03/27/2020 09:54 AM   Modules accepted: Orders

## 2020-03-29 LAB — COMPREHENSIVE METABOLIC PANEL
ALT: 38 IU/L — ABNORMAL HIGH (ref 0–32)
AST: 43 IU/L — ABNORMAL HIGH (ref 0–40)
Albumin/Globulin Ratio: 1.8 (ref 1.2–2.2)
Albumin: 5.2 g/dL — ABNORMAL HIGH (ref 3.8–4.8)
Alkaline Phosphatase: 115 IU/L (ref 48–121)
BUN/Creatinine Ratio: 14 (ref 12–28)
BUN: 11 mg/dL (ref 8–27)
Bilirubin Total: 0.4 mg/dL (ref 0.0–1.2)
CO2: 25 mmol/L (ref 20–29)
Calcium: 10.5 mg/dL — ABNORMAL HIGH (ref 8.7–10.3)
Chloride: 101 mmol/L (ref 96–106)
Creatinine, Ser: 0.79 mg/dL (ref 0.57–1.00)
GFR calc Af Amer: 92 mL/min/{1.73_m2} (ref >59)
GFR calc non Af Amer: 80 mL/min/{1.73_m2} (ref >59)
Globulin, Total: 2.9 g/dL (ref 1.5–4.5)
Glucose: 140 mg/dL — ABNORMAL HIGH (ref 65–99)
Potassium: 4.4 mmol/L (ref 3.5–5.2)
Sodium: 141 mmol/L (ref 134–144)
Total Protein: 8.1 g/dL (ref 6.0–8.5)

## 2020-03-29 LAB — ANA,IFA RA DIAG PNL W/RFLX TIT/PATN
ANA Titer 1: NEGATIVE
Cyclic Citrullin Peptide Ab: 14 units (ref 0–19)
Rheumatoid fact SerPl-aCnc: 10.2 IU/mL (ref 0.0–13.9)

## 2020-03-29 LAB — LIPID PANEL
Chol/HDL Ratio: 2.9 ratio (ref 0.0–4.4)
Cholesterol, Total: 142 mg/dL (ref 100–199)
HDL: 49 mg/dL (ref >39)
LDL Chol Calc (NIH): 64 mg/dL (ref 0–99)
Triglycerides: 171 mg/dL — ABNORMAL HIGH (ref 0–149)
VLDL Cholesterol Cal: 29 mg/dL (ref 5–40)

## 2020-03-29 LAB — CBC WITH DIFFERENTIAL/PLATELET
Basophils Absolute: 0.1 10*3/uL (ref 0.0–0.2)
Basos: 1 %
EOS (ABSOLUTE): 0.3 10*3/uL (ref 0.0–0.4)
Eos: 4 %
Hematocrit: 40.4 % (ref 34.0–46.6)
Hemoglobin: 13.7 g/dL (ref 11.1–15.9)
Immature Grans (Abs): 0 10*3/uL (ref 0.0–0.1)
Immature Granulocytes: 0 %
Lymphocytes Absolute: 2.9 10*3/uL (ref 0.7–3.1)
Lymphs: 46 %
MCH: 30 pg (ref 26.6–33.0)
MCHC: 33.9 g/dL (ref 31.5–35.7)
MCV: 89 fL (ref 79–97)
Monocytes Absolute: 0.4 10*3/uL (ref 0.1–0.9)
Monocytes: 6 %
Neutrophils Absolute: 2.8 10*3/uL (ref 1.4–7.0)
Neutrophils: 43 %
Platelets: 275 10*3/uL (ref 150–450)
RBC: 4.56 x10E6/uL (ref 3.77–5.28)
RDW: 14.2 % (ref 11.7–15.4)
WBC: 6.4 10*3/uL (ref 3.4–10.8)

## 2020-03-29 LAB — C-REACTIVE PROTEIN: CRP: 3 mg/L (ref 0–10)

## 2020-03-29 LAB — SEDIMENTATION RATE: Sed Rate: 18 mm/hr (ref 0–40)

## 2020-04-01 ENCOUNTER — Telehealth: Payer: Self-pay

## 2020-04-01 NOTE — Telephone Encounter (Signed)
Copied from [place] [id number]. Topic: General - Other >> Mar 31, 2020  8:23 AM Keene Breath wrote: Reason for CRM: Patient stated she is returning a call to the nurse regarding some FMLA paperwork.  She stated that the nurse  needed more information.  Please advise and call patient to discuss at [phone]

## 2020-04-01 NOTE — Telephone Encounter (Signed)
Patient called to speak to the nurse about her paperwork for her 6 month review. Ph# [phone]

## 2020-04-02 NOTE — Telephone Encounter (Signed)
Did either of you call this patient. Do you know anything about these forms. I know I took forms from patient and put them in Roland green folder. If you called patient please reach back out to her as soon as you can.

## 2020-04-02 NOTE — Telephone Encounter (Signed)
Called patient back I have paperwork I need to know her first missed date of work?

## 2020-04-07 ENCOUNTER — Telehealth: Payer: Self-pay

## 2020-04-07 DIAGNOSIS — E669 Obesity, unspecified: Secondary | ICD-10-CM

## 2020-04-07 MED ORDER — OZEMPIC (1 MG/DOSE) 4 MG/3ML ~~LOC~~ SOPN: 1.0000 mg | PEN_INJECTOR | SUBCUTANEOUS | 1 refills | Status: DC

## 2020-04-11 NOTE — Telephone Encounter (Signed)
Pt is calling checking on the status of FMLA paperwork from [employer]

## 2020-04-15 NOTE — Telephone Encounter (Signed)
Copied from [place] [id number]. Topic: General - Other >> Mar 31, 2020  8:23 AM Keene Breath wrote: Reason for CRM: Patient stated she is returning a call to the nurse regarding some FMLA paperwork.  She stated that the nurse  needed more information.  Please advise and call patient to discuss at [phone] >> Apr 15, 2020 10:34 AM Keene Breath wrote: Patient is calling again to get her FMLA paperwork faxed.  Please advise.  Patient stated that today is the cutoff.  Please call patient to confirm at [phone]

## 2020-04-15 NOTE — Telephone Encounter (Signed)
Info documented and sent to employer

## 2020-04-28 ENCOUNTER — Other Ambulatory Visit: Payer: Self-pay | Admitting: Family Medicine

## 2020-04-28 DIAGNOSIS — E1169 Type 2 diabetes mellitus with other specified complication: Secondary | ICD-10-CM

## 2020-05-09 ENCOUNTER — Other Ambulatory Visit: Payer: Self-pay | Admitting: Family Medicine

## 2020-05-09 DIAGNOSIS — Z1231 Encounter for screening mammogram for malignant neoplasm of breast: Secondary | ICD-10-CM

## 2020-05-09 DIAGNOSIS — R1012 Left upper quadrant pain: Secondary | ICD-10-CM

## 2020-05-09 DIAGNOSIS — K5900 Constipation, unspecified: Secondary | ICD-10-CM

## 2020-05-09 DIAGNOSIS — J3089 Other allergic rhinitis: Secondary | ICD-10-CM

## 2020-05-09 DIAGNOSIS — B3731 Acute candidiasis of vulva and vagina: Secondary | ICD-10-CM

## 2020-05-09 NOTE — Telephone Encounter (Signed)
Medication Refill - Medication: fluconazole (DIFLUCAN) 150 MG tablet,fluticasone (FLONASE) 50 MCG/ACT nasal spray ,polyethylene glycol powder (GLYCOLAX/MIRALAX) powder [,traMADol (ULTRAM) 50 MG tablet Has the patient contacted their pharmacy? yes (Agent: If no, request that the patient contact the pharmacy for the refill.) (Agent: If yes, when and what did the pharmacy advise?)Contact PCPPreferred Pharmacy (with phone number or street name):   [facility] [place], [place] Phone:  [phone]  Fax:  [phone]        Agent: Please be advised that RX refills may take up to 3 business days. We ask that you follow-up with your pharmacy.

## 2020-05-09 NOTE — Telephone Encounter (Signed)
Requested medication (s) are due for refill today: na  Requested medication (s) are on the active medication list: no  Last refill:  na  Future visit scheduled: yes  Notes to clinic:  requesting ultram 50mg  . Not order noted      Requested Prescriptions  Pending Prescriptions Disp Refills   polyethylene glycol powder (GLYCOLAX/MIRALAX) 17 GM/SCOOP powder 850 g 0    Sig: Take 17 g by mouth daily as needed for mild constipation or moderate constipation.      Gastroenterology:  Laxatives Passed - 05/09/2020  5:58 PM      Passed - Valid encounter within last 12 months    Recent Outpatient Visits           1 month ago Diabetes mellitus type 2 in obese [facility])   [facility] [place], Drue Stager, MD   4 months ago Dyslipidemia associated with type 2 diabetes mellitus [facility])   [facility] Steele Sizer, MD   8 months ago Chronic hip pain, bilateral   [facility] [place], Drue Stager, MD   10 months ago Dyslipidemia associated with type 2 diabetes mellitus [facility])   [facility] [place], Drue Stager, MD   1 year ago Dyslipidemia associated with type 2 diabetes mellitus [facility])   [facility] Steele Sizer, MD       Future Appointments             In 1 month Sow[place], Drue Stager, MD [facility]              fluticasone (FLONASE) 50 MCG/ACT nasal spray 48 g 0    Sig: SHAKE LIQUID AND USE 2 SPRAYS IN EACH NOSTRIL DAILY      Ear, Nose, and Throat: Nasal Preparations - Corticosteroids Passed - 05/09/2020  5:58 PM      Passed - Valid encounter within last 12 months    Recent Outpatient Visits           1 month ago Diabetes mellitus type 2 in obese [facility])   [facility] Steele Sizer, MD   4 months ago Dyslipidemia associated with type 2 diabetes mellitus [facility])   [facility] Steele Sizer, MD   8 months ago Chronic hip pain, bilateral    [facility] [place], Drue Stager, MD   10 months ago Dyslipidemia associated with type 2 diabetes mellitus [facility])   [facility] [place], Drue Stager, MD   1 year ago Dyslipidemia associated with type 2 diabetes mellitus [facility])   [facility] Steele Sizer, MD       Future Appointments             In 1 month Steele Sizer, MD [facility], [facility]              fluconazole (DIFLUCAN) 150 MG tablet 9 tablet 0      Off-Protocol Failed - 05/09/2020  5:58 PM      Failed - Medication not assigned to a protocol, review manually.      Passed - Valid encounter within last 12 months    Recent Outpatient Visits           1 month ago Diabetes mellitus type 2 in obese [facility])   [facility] [place], Drue Stager, MD   4 months ago Dyslipidemia associated with type 2 diabetes mellitus [facility])   [facility] Steele Sizer, MD  8 months ago Chronic hip pain, bilateral   [facility] [place], Drue Stager, MD   10 months ago Dyslipidemia associated with type 2 diabetes mellitus [facility])   [facility] [place], Drue Stager, MD   1 year ago Dyslipidemia associated with type 2 diabetes mellitus [facility])   [facility] Steele Sizer, MD       Future Appointments             In 1 month Ancil Boozer, Drue Stager, MD [facility]

## 2020-05-10 MED ORDER — FLUTICASONE PROPIONATE 50 MCG/ACT NA SUSP: NASAL | 0 refills | Status: DC

## 2020-05-10 MED ORDER — POLYETHYLENE GLYCOL 3350 17 GM/SCOOP PO POWD: 17.0000 g | Freq: Every day | ORAL | 0 refills | Status: DC | PRN

## 2020-05-10 NOTE — Telephone Encounter (Signed)
Requested  medications are  due for refill today yes  Requested medications are on the active medication list yes  Future visit scheduled Yes  Notes to clinic No protocol for this med, unsure if was to be continued.

## 2020-05-12 NOTE — Telephone Encounter (Unsigned)
Copied from [place] [phone]. Topic: General - Other >> May 09, 2020  5:11 PM Rainey Pines A wrote: Patient is requesting an order be placed for a mammogram. Please advise

## 2020-05-13 MED ORDER — FLUCONAZOLE 150 MG PO TABS: 150.0000 mg | ORAL_TABLET | ORAL | 0 refills | Status: DC

## 2020-05-22 ENCOUNTER — Other Ambulatory Visit: Payer: Self-pay | Admitting: Family Medicine

## 2020-05-22 DIAGNOSIS — I10 Essential (primary) hypertension: Secondary | ICD-10-CM

## 2020-05-22 NOTE — Telephone Encounter (Signed)
According to patient's chart this medication was last ordered  03/27/20 #90 with one refill. Refusing request due to requesting too early.

## 2020-06-02 DIAGNOSIS — R7989 Other specified abnormal findings of blood chemistry: Secondary | ICD-10-CM | POA: Insufficient documentation

## 2020-06-02 DIAGNOSIS — M8949 Other hypertrophic osteoarthropathy, multiple sites: Secondary | ICD-10-CM | POA: Insufficient documentation

## 2020-06-02 DIAGNOSIS — M4722 Other spondylosis with radiculopathy, cervical region: Secondary | ICD-10-CM | POA: Insufficient documentation

## 2020-06-02 DIAGNOSIS — M159 Polyosteoarthritis, unspecified: Secondary | ICD-10-CM | POA: Insufficient documentation

## 2020-06-02 DIAGNOSIS — M0609 Rheumatoid arthritis without rheumatoid factor, multiple sites: Secondary | ICD-10-CM | POA: Insufficient documentation

## 2020-06-02 DIAGNOSIS — Z79899 Other long term (current) drug therapy: Secondary | ICD-10-CM | POA: Insufficient documentation

## 2020-06-06 ENCOUNTER — Telehealth: Payer: Self-pay

## 2020-06-06 NOTE — Telephone Encounter (Signed)
Copied from [place] [id number]. Topic: General - Other >> Jun 06, 2020  9:18 AM Hinda Lenis D wrote: PT calling to F/UP on her disability papers, she needs to speak with a nurse / please advise

## 2020-06-10 DIAGNOSIS — R200 Anesthesia of skin: Secondary | ICD-10-CM | POA: Insufficient documentation

## 2020-06-10 DIAGNOSIS — R202 Paresthesia of skin: Secondary | ICD-10-CM | POA: Insufficient documentation

## 2020-06-14 ENCOUNTER — Other Ambulatory Visit: Payer: Self-pay | Admitting: Family Medicine

## 2020-06-14 DIAGNOSIS — E1169 Type 2 diabetes mellitus with other specified complication: Secondary | ICD-10-CM

## 2020-06-14 NOTE — Telephone Encounter (Signed)
Requested Prescriptions  Pending Prescriptions Disp Refills  . OZEMPIC, 1 MG/DOSE, 4 MG/3ML SOPN [Pharmacy Med Name: OZEMPIC 1MG  PER DOSE (1X4MG  PEN)] 9 mL 0    Sig: INJECT 1 MG INTO THE SKIN ONCE A WEEK     Endocrinology:  Diabetes - GLP-1 Receptor Agonists Failed - 06/14/2020  5:59 PM      Failed - HBA1C is between 0 and 7.9 and within 180 days    Hemoglobin A1C  Date Value Ref Range Status  03/27/2020 8.1 (A) 4.0 - 5.6 % Final   HbA1c, POC (prediabetic range)  Date Value Ref Range Status  07/27/2018 7.4 (A) 5.7 - 6.4 % Final   HbA1c, POC (controlled diabetic range)  Date Value Ref Range Status  02/28/2019 7.3 (A) 0.0 - 7.0 % Final   Hgb A1c MFr Bld  Date Value Ref Range Status  11/29/2019 6.9 (H) 4.8 - 5.6 % Final    Comment:             Prediabetes: 5.7 - 6.4          Diabetes: >6.4          Glycemic control for adults with diabetes: <7.0          Passed - Valid encounter within last 6 months    Recent Outpatient Visits          2 months ago Diabetes mellitus type 2 in obese [facility])   [facility]izer, MD   5 months ago Dyslipidemia associated with type 2 diabetes mellitus [facility])   [facility] Sizer, MD   9 months ago Chronic hip pain, bilateral   [facility] [address], Drue Stager, MD   11 months ago Dyslipidemia associated with type 2 diabetes mellitus [facility])   [facility] [place], Drue Stager, MD   1 year ago Dyslipidemia associated with type 2 diabetes mellitus [facility])   [facility] Steele Sizer, MD      Future Appointments            In 2 weeks Steele Sizer, MD [facility]

## 2020-06-21 ENCOUNTER — Other Ambulatory Visit: Payer: Self-pay | Admitting: Family Medicine

## 2020-06-21 DIAGNOSIS — J3089 Other allergic rhinitis: Secondary | ICD-10-CM

## 2020-06-21 DIAGNOSIS — E1169 Type 2 diabetes mellitus with other specified complication: Secondary | ICD-10-CM

## 2020-06-21 DIAGNOSIS — E785 Hyperlipidemia, unspecified: Secondary | ICD-10-CM

## 2020-06-22 ENCOUNTER — Other Ambulatory Visit: Payer: Self-pay | Admitting: Family Medicine

## 2020-06-22 DIAGNOSIS — E1169 Type 2 diabetes mellitus with other specified complication: Secondary | ICD-10-CM

## 2020-06-23 ENCOUNTER — Telehealth: Payer: Self-pay | Admitting: Family Medicine

## 2020-06-23 DIAGNOSIS — E785 Hyperlipidemia, unspecified: Secondary | ICD-10-CM

## 2020-06-23 DIAGNOSIS — E1169 Type 2 diabetes mellitus with other specified complication: Secondary | ICD-10-CM

## 2020-06-23 NOTE — Telephone Encounter (Signed)
Pt called and is requesting to know why this medication is being denied. Please Advise.

## 2020-06-30 ENCOUNTER — Encounter: Payer: Self-pay | Admitting: Family Medicine

## 2020-06-30 ENCOUNTER — Other Ambulatory Visit: Payer: Self-pay

## 2020-06-30 ENCOUNTER — Ambulatory Visit (INDEPENDENT_AMBULATORY_CARE_PROVIDER_SITE_OTHER): Payer: 59 | Admitting: Family Medicine

## 2020-06-30 VITALS — BP 136/88 | HR 89 | Temp 98.4°F | Resp 16 | Ht 67.0 in | Wt 213.8 lb

## 2020-06-30 DIAGNOSIS — E669 Obesity, unspecified: Secondary | ICD-10-CM

## 2020-06-30 DIAGNOSIS — I10 Essential (primary) hypertension: Secondary | ICD-10-CM | POA: Diagnosis not present

## 2020-06-30 DIAGNOSIS — E559 Vitamin D deficiency, unspecified: Secondary | ICD-10-CM

## 2020-06-30 DIAGNOSIS — Z23 Encounter for immunization: Secondary | ICD-10-CM

## 2020-06-30 DIAGNOSIS — J3089 Other allergic rhinitis: Secondary | ICD-10-CM | POA: Diagnosis not present

## 2020-06-30 DIAGNOSIS — Z96642 Presence of left artificial hip joint: Secondary | ICD-10-CM

## 2020-06-30 DIAGNOSIS — M109 Gout, unspecified: Secondary | ICD-10-CM

## 2020-06-30 DIAGNOSIS — M0609 Rheumatoid arthritis without rheumatoid factor, multiple sites: Secondary | ICD-10-CM | POA: Diagnosis not present

## 2020-06-30 DIAGNOSIS — E785 Hyperlipidemia, unspecified: Secondary | ICD-10-CM

## 2020-06-30 DIAGNOSIS — E1169 Type 2 diabetes mellitus with other specified complication: Secondary | ICD-10-CM

## 2020-06-30 DIAGNOSIS — B3731 Acute candidiasis of vulva and vagina: Secondary | ICD-10-CM

## 2020-06-30 DIAGNOSIS — B373 Candidiasis of vulva and vagina: Secondary | ICD-10-CM

## 2020-06-30 LAB — POCT GLYCOSYLATED HEMOGLOBIN (HGB A1C): Hemoglobin A1C: 7.6 % — AB (ref 4.0–5.6)

## 2020-06-30 MED ORDER — FLUCONAZOLE 150 MG PO TABS: 150.0000 mg | ORAL_TABLET | ORAL | 0 refills | Status: DC

## 2020-06-30 MED ORDER — PIOGLITAZONE HCL 15 MG PO TABS: 15.0000 mg | ORAL_TABLET | Freq: Every day | ORAL | 0 refills | Status: DC

## 2020-06-30 MED ORDER — MONTELUKAST SODIUM 10 MG PO TABS: 10.0000 mg | ORAL_TABLET | Freq: Every day | ORAL | 1 refills | Status: DC

## 2020-06-30 MED ORDER — FLUTICASONE PROPIONATE 50 MCG/ACT NA SUSP: NASAL | 0 refills | Status: DC

## 2020-06-30 NOTE — Progress Notes (Signed)
Name: Maria Cruz   MRN: [id number]    DOB: 08-27-1955   Date:06/30/2020       Progress Note  Subjective  Chief Complaint   HPI  Follow upDM II with proteinuria: urine microhas been elevated since 2019, but very high 11/2019 at 1.531. She is under the care of Dr. Abigail Butts nephrologist . She is on  ARB/CCB, Synjardi and Ozempic andhgbA1C6.8% Feb 2020 andwasup to 7.3%in July 20207.2 % Jan 21 and April 6.9 % up to  8.1 % and down to 7.6% today, she changed her diet , cutting down on sweets, she also changed her night time snacks to nuts and raisins, advised to change to nuts and cheese. She has dyslipidemia and takes Atorvastatin and Lovaza, she also has microalbuminuria in the urine at 1.531 - she is under the care of nephrologist.She denies polyphagia, polydipsia and polyuria.   Hyperlipidemia: Taking Lovaza and Rosuvastatin and LDL is now at goal at 64, continue medications. No side effects   HTN: She is onAZOR for now ( Norvasc plus Benicar )Denies chest pain, palpitation or dizziness.She has DM also and proteinuria, under the care of nephrologist. BP is under control and she is compliant with medication   Gout: no recent episodes,taking Allopurinol daily,denies side effects.She is still taking two daily, advised to try taking only one daily and if no flares to let me know next visit   Right 5 th finger pain: she states since left hip surgery on 04/28 she has noticed pain, tingling and numbness, also  thickness of right  5th finger, stiffness and now also swelling, she is now seeing a hand sub-specialist Dr. Peggye Ley , she is getting OT but is still having difficulty flexing her fingers, she had NCS that was normal, she also has bilateral shoulder pain . 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 She is right hand dominant . She continues to have problems, seen by Rheumatologist Dr. Posey Pronto and also neurologist Dr.  Melrose Nakayama. She was on short term disability but was told to go back to work on 06/28/2020 and had difficulty at work, increase in pain and also difficulty doing her job because of inability to make a fist with right hand. She is on Lyrica and is now on hydroxychloroquine   History of  left hip replacement on 12/05/2019 by Dr. Harlow Mares, she finished PT of left pain is still present at 7/10, pain is  worse when she is walking, she also has some tingling in the area. She has to get up slowly , she is unable to get up and go, gait is slow  Patient Active Problem List   Diagnosis Date Noted  . Numbness and tingling 06/10/2020  . Encounter for long-term (current) use of high-risk medication 06/02/2020  . LFT elevation 06/02/2020  . Osteoarthritis of spine with radiculopathy, cervical region 06/02/2020  . Primary osteoarthritis involving multiple joints 06/02/2020  . Rheumatoid arthritis of multiple sites with negative rheumatoid factor ([place]) 06/02/2020  . Acute posthemorrhagic anemia 03/27/2020  . Hip pain 03/27/2020  . Osteoarthritis 03/27/2020  . Sinus tachycardia 03/27/2020  . History of total hip arthroplasty 12/18/2019  . Proteinuria, unspecified 10/10/2019  . Microcalcification of left breast on mammogram 05/03/2017  . Herniated intervertebral disc of lumbar spine 09/02/2016  . Spondylolisthesis of lumbar region 09/02/2016  . Controlled type 2 diabetes mellitus with microalbuminuria, without long-term current use of insulin ([place]) 02/24/2015  . Vitamin D  deficiency 02/24/2015  . Perennial allergic rhinitis 02/24/2015  . Hypercalcemia 02/24/2015  . GERD without esophagitis 02/24/2015  . Hypertension, benign 02/24/2015  . Controlled gout 02/24/2015  . Fatty liver 02/24/2015  . Callus of foot 02/24/2015  . Primary osteoarthritis of right hip 02/24/2015  . History of carpal tunnel syndrome 02/24/2015  . Obesity (BMI 30-39.9) 02/24/2015  . Umbilical hernia without obstruction or gangrene  02/24/2015  . Intermittent low back pain 02/24/2015  . History of colonic polyps 02/24/2015  . Dyslipidemia 02/24/2015    Past Surgical History:  Procedure Laterality Date  . ABDOMINAL HYSTERECTOMY  2012  . APPENDECTOMY  2012  . BREAST BIOPSY Right 07/12/2012   Negative  . BREAST BIOPSY Left 05/09/2017   Affirm Bx of two areas- path pending  . COLONOSCOPY  2009   Dr Allen Norris  . COLONOSCOPY WITH PROPOFOL N/A 07/27/2017   Procedure: COLONOSCOPY WITH PROPOFOL;  Surgeon: Robert Bellow, MD;  Location: [facility] ENDOSCOPY;  Service: Endoscopy;  Laterality: N/A;  . DILATION AND CURETTAGE OF UTERUS  2007  . TUBAL LIGATION      Family History  Problem Relation Age of Onset  . Multiple sclerosis Mother   . Heart attack Father   . Kidney failure Sister   . Heart attack Brother   . Diabetes Daughter   . CAD Sister   . Arthritis Sister        RA  . Lupus Sister   . Breast cancer Neg Hx   . Colon cancer Neg Hx     Social History   Tobacco Use  . Smoking status: Never Smoker  . Smokeless tobacco: Never Used  Substance Use Topics  . Alcohol use: No    Alcohol/week: 0.0 standard drinks     Current Outpatient Medications:  .  acetaminophen (TYLENOL) 500 MG tablet, Take 1 tablet (500 mg total) by mouth every 6 (six) hours as needed., Disp: 90 tablet, Rfl: 0 .  allopurinol (ZYLOPRIM) 300 MG tablet, TAKE 1 TABLET(300 MG) BY MOUTH TWICE DAILY, Disp: 180 tablet, Rfl: 3 .  amLODipine-olmesartan (AZOR) 5-40 MG tablet, Take 1 tablet by mouth daily., Disp: 90 tablet, Rfl: 1 .  aspirin 81 MG chewable tablet, Chew 81 mg by mouth daily., Disp: , Rfl:  .  blood glucose meter kit and supplies, Dispense based on patient and insurance preference. Use up to four times daily as directed. (FOR ICD-10 E10.9, E11.9). Check fsbs up to twice daily, Disp: 1 each, Rfl: 0 .  Blood Glucose Monitoring Suppl (FREESTYLE LITE) DEVI, 1 each by Does not apply route daily., Disp: 1 each, Rfl: 0 .  Cholecalciferol  (VITAMIN D-1000 MAX ST) 1000 units tablet, Take by mouth., Disp: , Rfl:  .  Coenzyme Q10 (CO Q 10 PO), Take by mouth daily., Disp: , Rfl:  .  diclofenac Sodium (VOLTAREN) 1 % GEL, Apply 2 g topically 4 (four) times daily., Disp: 100 g, Rfl: 2 .  Empagliflozin-metFORMIN HCl ER (SYNJARDY XR) 12.12-998 MG TB24, Take 2 tablets by mouth daily., Disp: 180 tablet, Rfl: 1 .  fluconazole (DIFLUCAN) 150 MG tablet, Take 1 tablet (150 mg total) by mouth every other day., Disp: 9 tablet, Rfl: 0 .  fluticasone (FLONASE) 50 MCG/ACT nasal spray, SHAKE LIQUID AND USE 2 SPRAYS IN EACH NOSTRIL DAILY, Disp: 48 g, Rfl: 0 .  Lancets (ONETOUCH DELICA PLUS [id number]) MISC, TEST UP TO FOUR TIMES DAILY, Disp: 100 each, Rfl: 11 .  levocetirizine (XYZAL) 5 MG tablet, TAKE 1 TABLET(5  MG) BY MOUTH EVERY EVENING, Disp: 90 tablet, Rfl: 0 .  montelukast (SINGULAIR) 10 MG tablet, TAKE 1 TABLET(10 MG) BY MOUTH AT BEDTIME, Disp: 30 tablet, Rfl: 0 .  omega-3 acid ethyl esters (LOVAZA) 1 g capsule, TAKE 2 CAPSULES BY MOUTH TWICE DAILY, Disp: 360 capsule, Rfl: 2 .  ONETOUCH ULTRA test strip, TEST UP TO FOUR TIMES DAILY, Disp: 300 strip, Rfl: 1 .  OZEMPIC, 1 MG/DOSE, 4 MG/3ML SOPN, INJECT 1 MG INTO THE SKIN ONCE A WEEK, Disp: 9 mL, Rfl: 0 .  polyethylene glycol powder (GLYCOLAX/MIRALAX) 17 GM/SCOOP powder, Take 17 g by mouth daily as needed for mild constipation or moderate constipation., Disp: 850 g, Rfl: 0 .  pregabalin (LYRICA) 200 MG capsule, Take 1 capsule (200 mg total) by mouth 2 (two) times daily., Disp: 180 capsule, Rfl: 1 .  rosuvastatin (CRESTOR) 40 MG tablet, TAKE 1 TABLET BY MOUTH DAILY IN PLACE OF ATORVASTATIN, Disp: 90 tablet, Rfl: 0 .  Vitamin D, Ergocalciferol, 50 MCG (2000 UT) CAPS, Take 1 capsule by mouth daily., Disp: 90 capsule, Rfl: 1  Allergies  Allergen Reactions  . Ace Inhibitors Cough  . Lipofen [Fenofibrate] Other (See Comments)    Localized drug reaction, blister on back  . Penicillins Itching  . Sulfa  Antibiotics Itching    I personally reviewed active problem list, medication list, allergies, family history, social history, health maintenance with the patient/caregiver today.   ROS  Constitutional: Negative for fever or weight change.  Respiratory: Negative for cough and shortness of breath.   Cardiovascular: Negative for chest pain or palpitations.  Gastrointestinal: Negative for abdominal pain, no bowel changes.  Musculoskeletal: Negative for gait problem, positive for  joint swelling.  Skin: Negative for rash.  Neurological: Negative for dizziness or headache.  No other specific complaints in a complete review of systems (except as listed in HPI above).  Objective  Vitals:   06/30/20 0902  BP: 136/88  Pulse: 89  Resp: 16  Temp: 98.4 F (36.9 C)  TempSrc: Oral  SpO2: 100%  Weight: 213 lb 12.8 oz (97 kg)  Height: _0  (1.702 m)    Body mass index is 33.49 kg/m.  Physical Exam  Constitutional: Patient appears well-developed and well-nourished. Obese  No distress.  HEENT: head atraumatic, normocephalic, pupils equal and reactive to light,  neck supple Cardiovascular: Normal rate, regular rhythm and normal heart sounds.  No murmur heard. No BLE edema. Pulmonary/Chest: Effort normal and breath sounds normal. No respiratory distress. Abdominal: Soft.  There is no tenderness. Muscular skeletal: unable to make a fist with right hand, hand is swollen and tender to touch, pain on antecubital are of right arm  Psychiatric: Patient has a normal mood and affect. behavior is normal. Judgment and thought content normal.  Recent Results (from the past 2160 hour(s))  POCT HgB A1C     Status: Abnormal   Collection Time: 06/30/20  9:10 AM  Result Value Ref Range   Hemoglobin A1C 7.6 (A) 4.0 - 5.6 %   HbA1c POC (<> result, manual entry)     HbA1c, POC (prediabetic range)     HbA1c, POC (controlled diabetic range)       PHQ2/9: Depression screen [facility] 2/9 06/30/2020 03/27/2020  12/26/2019 09/03/2019 07/02/2019  Decreased Interest 0 0 0 0 0  Down, Depressed, Hopeless 0 0 0 0 0  PHQ - 2 Score 0 0 0 0 0  Altered sleeping - 0 0 0 0  Tired, decreased energy - 0  0 0 0  Change in appetite - 1 0 0 0  Feeling bad or failure about yourself  - 0 0 0 0  Trouble concentrating - 0 0 0 0  Moving slowly or fidgety/restless - 0 0 0 0  Suicidal thoughts - 0 0 0 0  PHQ-9 Score - 1 0 0 0  Difficult doing work/chores - Somewhat difficult Not difficult at all - Not difficult at all  Some recent data might be hidden    phq 9 is negative   Fall Risk: Fall Risk  06/30/2020 03/27/2020 12/26/2019 09/03/2019 07/02/2019  Falls in the past year? 0 0 0 0 0  Number falls in past yr: 0 0 0 0 0  Injury with Fall? 0 0 0 0 0  Follow up - - Falls evaluation completed - -    Functional Status Survey: Is the patient deaf or have difficulty hearing?: No Does the patient have difficulty seeing, even when wearing glasses/contacts?: No Does the patient have difficulty concentrating, remembering, or making decisions?: No Does the patient have difficulty walking or climbing stairs?: No Does the patient have difficulty dressing or bathing?: No Does the patient have difficulty doing errands alone such as visiting a doctor's office or shopping?: No    Assessment & Plan  1. Diabetes mellitus type 2 in obese (HCC)  - POCT HgB A1C - pioglitazone (ACTOS) 15 MG tablet; Take 1 tablet (15 mg total) by mouth daily.  Dispense: 90 tablet; Refill: 0  2. Rheumatoid arthritis of multiple sites with negative rheumatoid factor (HCC)  Keep follow up with Dr. Posey Pronto   3. Hypertension, benign   4. Dyslipidemia associated with type 2 diabetes mellitus ([place])   5. Perennial allergic rhinitis  - fluticasone (FLONASE) 50 MCG/ACT nasal spray; SHAKE LIQUID AND USE 2 SPRAYS IN EACH NOSTRIL DAILY  Dispense: 48 g; Refill: 0 - montelukast (SINGULAIR) 10 MG tablet; Take 1 tablet (10 mg total) by mouth at bedtime.   Dispense: 90 tablet; Refill: 1  6. History of left hip replacement   7. Vitamin D deficiency   8. Controlled gout   9. Need for shingles vaccine  - Varicella-zoster vaccine IM  10. Yeast vaginitis  - fluconazole (DIFLUCAN) 150 MG tablet; Take 1 tablet (150 mg total) by mouth every other day.  Dispense: 9 tablet; Refill: 0  11. Dyslipidemia  - montelukast (SINGULAIR) 10 MG tablet; Take 1 tablet (10 mg total) by mouth at bedtime.  Dispense: 90 tablet; Refill: 1

## 2020-07-14 ENCOUNTER — Other Ambulatory Visit: Payer: Self-pay | Admitting: Family Medicine

## 2020-07-14 DIAGNOSIS — M5126 Other intervertebral disc displacement, lumbar region: Secondary | ICD-10-CM

## 2020-07-14 NOTE — Telephone Encounter (Signed)
Requested medication (s) are due for refill today - yes  Requested medication (s) are on the active medication list -yes  Future visit scheduled -yes  Last refill: 12/26/19  Notes to clinic: request for RF non delegated Rx  Requested Prescriptions  Pending Prescriptions Disp Refills   pregabalin (LYRICA) 200 MG capsule [Pharmacy Med Name: PREGABALIN 200MG  CAPSULES] 180 capsule     Sig: TAKE 1 CAPSULE(200 MG) BY MOUTH TWICE DAILY      Not Delegated - Neurology:  Anticonvulsants - Controlled Failed - 07/14/2020 12:28 PM      Failed - This refill cannot be delegated      Passed - Valid encounter within last 12 months    Recent Outpatient Visits           2 weeks ago Diabetes mellitus type 2 in obese [facility])   [facility] [place], Drue Stager, MD   3 months ago Diabetes mellitus type 2 in obese [facility])   [facility] [place], Drue Stager, MD   6 months ago Dyslipidemia associated with type 2 diabetes mellitus [facility])   [facility] Steele Sizer, MD   10 months ago Chronic hip pain, bilateral   [facility] [place], Drue Stager, MD   1 year ago Dyslipidemia associated with type 2 diabetes mellitus [facility])   [facility] Steele Sizer, MD       Future Appointments             In 2 months Ancil Boozer, Drue Stager, MD [facility], [facility]                Requested Prescriptions  Pending Prescriptions Disp Refills   pregabalin (LYRICA) 200 MG capsule [Pharmacy Med Name: PREGABALIN 200MG  CAPSULES] 180 capsule     Sig: TAKE 1 CAPSULE(200 MG) BY MOUTH TWICE DAILY      Not Delegated - Neurology:  Anticonvulsants - Controlled Failed - 07/14/2020 12:28 PM      Failed - This refill cannot be delegated      Passed - Valid encounter within last 12 months    Recent Outpatient Visits           2 weeks ago Diabetes mellitus type 2 in obese [facility])   [facility] Steele Sizer,  MD   3 months ago Diabetes mellitus type 2 in obese [facility])   [facility] [place], Drue Stager, MD   6 months ago Dyslipidemia associated with type 2 diabetes mellitus [facility])   [facility] Steele Sizer, MD   10 months ago Chronic hip pain, bilateral   [facility] [place], Drue Stager, MD   1 year ago Dyslipidemia associated with type 2 diabetes mellitus [facility])   [facility] Steele Sizer, MD       Future Appointments             In 2 months Ancil Boozer, Drue Stager, MD [facility], [facility]

## 2020-07-16 DIAGNOSIS — G5621 Lesion of ulnar nerve, right upper limb: Secondary | ICD-10-CM | POA: Insufficient documentation

## 2020-07-16 DIAGNOSIS — G5712 Meralgia paresthetica, left lower limb: Secondary | ICD-10-CM | POA: Insufficient documentation

## 2020-09-08 ENCOUNTER — Other Ambulatory Visit: Payer: Self-pay | Admitting: Family Medicine

## 2020-09-08 DIAGNOSIS — E1169 Type 2 diabetes mellitus with other specified complication: Secondary | ICD-10-CM

## 2020-09-08 DIAGNOSIS — E669 Obesity, unspecified: Secondary | ICD-10-CM

## 2020-09-10 ENCOUNTER — Other Ambulatory Visit: Payer: Self-pay | Admitting: Family Medicine

## 2020-09-10 DIAGNOSIS — B373 Candidiasis of vulva and vagina: Secondary | ICD-10-CM

## 2020-09-10 DIAGNOSIS — B3731 Acute candidiasis of vulva and vagina: Secondary | ICD-10-CM

## 2020-09-10 NOTE — Telephone Encounter (Signed)
Requested medication (s) are due for refill today:   Yes  Requested medication (s) are on the active medication list:   Yes  Future visit scheduled:   Yes   Last ordered: 06/30/2020 #9, 0 refills  Clinic note:   Returned because no protocol assigned to this medication.   Requested Prescriptions  Pending Prescriptions Disp Refills   fluconazole (DIFLUCAN) 150 MG tablet [Pharmacy Med Name: FLUCONAZOLE 150MG  TABLETS] 9 tablet 0    Sig: TAKE 1 TABLET BY MOUTH EVERY OTHER DAY AS NEEDED      Off-Protocol Failed - 09/10/2020  2:17 PM      Failed - Medication not assigned to a protocol, review manually.      Passed - Valid encounter within last 12 months    Recent Outpatient Visits           2 months ago Diabetes mellitus type 2 in obese [facility])   [facility] [place], Drue Stager, MD   5 months ago Diabetes mellitus type 2 in obese [facility])   [facility] Steele Sizer, MD   8 months ago Dyslipidemia associated with type 2 diabetes mellitus [facility])   [facility] Steele Sizer, MD   1 year ago Chronic hip pain, bilateral   [facility] [place], Drue Stager, MD   1 year ago Dyslipidemia associated with type 2 diabetes mellitus [facility])   [facility] Steele Sizer, MD       Future Appointments             In 2 weeks Steele Sizer, MD [facility], [facility]

## 2020-09-18 ENCOUNTER — Other Ambulatory Visit: Payer: Self-pay | Admitting: Family Medicine

## 2020-09-18 DIAGNOSIS — J3089 Other allergic rhinitis: Secondary | ICD-10-CM

## 2020-09-18 DIAGNOSIS — E1169 Type 2 diabetes mellitus with other specified complication: Secondary | ICD-10-CM

## 2020-09-18 DIAGNOSIS — E785 Hyperlipidemia, unspecified: Secondary | ICD-10-CM

## 2020-09-25 ENCOUNTER — Other Ambulatory Visit: Payer: Self-pay

## 2020-09-25 ENCOUNTER — Ambulatory Visit (INDEPENDENT_AMBULATORY_CARE_PROVIDER_SITE_OTHER): Payer: Managed Care, Other (non HMO) | Admitting: Family Medicine

## 2020-09-25 ENCOUNTER — Encounter: Payer: Self-pay | Admitting: Family Medicine

## 2020-09-25 VITALS — BP 132/70 | HR 88 | Temp 98.1°F | Resp 16 | Ht 67.0 in | Wt 207.0 lb

## 2020-09-25 DIAGNOSIS — E669 Obesity, unspecified: Secondary | ICD-10-CM | POA: Diagnosis not present

## 2020-09-25 DIAGNOSIS — E785 Hyperlipidemia, unspecified: Secondary | ICD-10-CM

## 2020-09-25 DIAGNOSIS — I10 Essential (primary) hypertension: Secondary | ICD-10-CM | POA: Diagnosis not present

## 2020-09-25 DIAGNOSIS — M109 Gout, unspecified: Secondary | ICD-10-CM | POA: Diagnosis not present

## 2020-09-25 DIAGNOSIS — J3089 Other allergic rhinitis: Secondary | ICD-10-CM | POA: Diagnosis not present

## 2020-09-25 DIAGNOSIS — E1169 Type 2 diabetes mellitus with other specified complication: Secondary | ICD-10-CM | POA: Diagnosis not present

## 2020-09-25 LAB — POCT GLYCOSYLATED HEMOGLOBIN (HGB A1C): Hemoglobin A1C: 6.3 % — AB (ref 4.0–5.6)

## 2020-09-25 MED ORDER — OZEMPIC (1 MG/DOSE) 4 MG/3ML ~~LOC~~ SOPN: 1.0000 mg | PEN_INJECTOR | SUBCUTANEOUS | 0 refills | Status: DC

## 2020-09-25 MED ORDER — ROSUVASTATIN CALCIUM 40 MG PO TABS: 40.0000 mg | ORAL_TABLET | Freq: Every day | ORAL | 1 refills | Status: DC

## 2020-09-25 MED ORDER — AMLODIPINE-OLMESARTAN 5-40 MG PO TABS
1.0000 | ORAL_TABLET | Freq: Every day | ORAL | 1 refills | Status: DC
Start: 2020-09-25 — End: 2021-05-11

## 2020-09-25 MED ORDER — LEVOCETIRIZINE DIHYDROCHLORIDE 5 MG PO TABS: 5.0000 mg | ORAL_TABLET | Freq: Every evening | ORAL | 0 refills | Status: DC

## 2020-09-25 MED ORDER — SYNJARDY XR 12.5-1000 MG PO TB24: 2.0000 | ORAL_TABLET | Freq: Every day | ORAL | 1 refills | Status: DC

## 2020-09-25 NOTE — Progress Notes (Signed)
Name: Maria Cruz   MRN: [id number]    DOB: [birth year]   Date:09/25/2020       Progress Note  Subjective  Chief Complaint  Chief Complaint  Patient presents with  . Follow-up    HPI  DM II with proteinuria: urine microhas been elevated since 2019, but very high 11/2019 at 1.531. She is under the care of Dr. Abigail Butts nephrologist . She is on  ARB/CCB, Synjardi and Ozempic andhgbA1C6.8% Feb 2020 it went up to 7.6 % on her last visit and we send rx of Actos but she states never received medication, she has been compliant with her diet and A1C today is down to 6.3 % therefore no need to get Actos refilled.  She has dyslipidemia and takes Rosuvastatin  and Lovaza, she also has microalbuminuria and is on ARB. She denies polyphagia, polydipsia and polyuria.   Hyperlipidemia: Taking Lovaza and Rosuvastatin and LDL is now at goal at 64, continue medications. No side effects    HTN: She is onAZOR for now ( Norvasc plus Benicar )Denies chest pain, palpitation or dizziness.She has DM also and proteinuria, under the care of nephrologist. BP is under control and she is compliant with medication and denies side effects  Gout: no recent episodes,taking Allopurinol daily,denies side effects.She has gone down to once daily but thinking about going up on medication again.   Right 5 th finger pain: she states since left hip surgery on 04/28 she has noticed pain, tingling and numbness, also  thickness of right  5th finger, stiffness and now also swelling, she is now seeing a hand sub-specialist Dr. Peggye Ley , she is getting OT but is still having difficulty flexing her fingers, she had NCS that was normal, she also has bilateral shoulder pain . 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 She is right hand dominant . She continues to have problems, seen by Rheumatologist Dr. Posey Pronto and also neurologist Dr. Melrose Nakayama. She was on short term  disability but was told to go back to work on 06/28/2020 and had difficulty at work, increase in pain and also difficulty doing her job because of inability to make a fist with right hand. She is on Lyrica on hydroxychloroquine and recently started on Certrolizumab, she states swelling has improved but has numbness on 4th and 5th fingers of right hand due to cubital entrapment and may need surgery She is thinking about retiring in March   History of  left hip replacement on 12/05/2019 by Dr. Harlow Mares, she finished PT of left pain is still present at 8/10, pain is  worse when she is walking, she also has some tingling in the area. She has to get up slowly , she is unable to get up and go, gait is slow, advised her to go back to see him since not feeling better   Patient Active Problem List   Diagnosis Date Noted  . Cubital tunnel syndrome, right 07/16/2020  . Meralgia paraesthetica, left 07/16/2020  . Numbness and tingling 06/10/2020  . Encounter for long-term (current) use of high-risk medication 06/02/2020  . LFT elevation 06/02/2020  . Osteoarthritis of spine with radiculopathy, cervical region 06/02/2020  . Primary osteoarthritis involving multiple joints 06/02/2020  . Rheumatoid arthritis of multiple sites with negative rheumatoid factor ([place]) 06/02/2020  . Acute posthemorrhagic anemia 03/27/2020  . Hip pain 03/27/2020  . Osteoarthritis 03/27/2020  . Sinus tachycardia 03/27/2020  . History of total  hip arthroplasty 12/18/2019  . Proteinuria, unspecified 10/10/2019  . Microcalcification of left breast on mammogram 05/03/2017  . Herniated intervertebral disc of lumbar spine 09/02/2016  . Spondylolisthesis of lumbar region 09/02/2016  . Controlled type 2 diabetes mellitus with microalbuminuria, without long-term current use of insulin ([place]) 02/24/2015  . Vitamin D deficiency 02/24/2015  . Perennial allergic rhinitis 02/24/2015  . Hypercalcemia 02/24/2015  . GERD without esophagitis  02/24/2015  . Hypertension, benign 02/24/2015  . Controlled gout 02/24/2015  . Fatty liver 02/24/2015  . Callus of foot 02/24/2015  . Primary osteoarthritis of right hip 02/24/2015  . History of carpal tunnel syndrome 02/24/2015  . Obesity (BMI 30-39.9) 02/24/2015  . Umbilical hernia without obstruction or gangrene 02/24/2015  . Intermittent low back pain 02/24/2015  . History of colonic polyps 02/24/2015  . Dyslipidemia 02/24/2015    Past Surgical History:  Procedure Laterality Date  . ABDOMINAL HYSTERECTOMY  2012  . APPENDECTOMY  2012  . BREAST BIOPSY Right 07/12/2012   Negative  . BREAST BIOPSY Left 05/09/2017   Affirm Bx of two areas- path pending  . COLONOSCOPY  2009   Dr Allen Norris  . COLONOSCOPY WITH PROPOFOL N/A 07/27/2017   Procedure: COLONOSCOPY WITH PROPOFOL;  Surgeon: Robert Bellow, MD;  Location: [facility] ENDOSCOPY;  Service: Endoscopy;  Laterality: N/A;  . DILATION AND CURETTAGE OF UTERUS  2007  . TUBAL LIGATION      Family History  Problem Relation Age of Onset  . Multiple sclerosis Mother   . Heart attack Father   . Kidney failure Sister   . Heart attack Brother   . Diabetes Daughter   . CAD Sister   . Arthritis Sister        RA  . Lupus Sister   . Breast cancer Neg Hx   . Colon cancer Neg Hx     Social History   Tobacco Use  . Smoking status: Never Smoker  . Smokeless tobacco: Never Used  Substance Use Topics  . Alcohol use: No    Alcohol/week: 0.0 standard drinks     Current Outpatient Medications:  .  acetaminophen (TYLENOL) 500 MG tablet, Take 1 tablet (500 mg total) by mouth every 6 (six) hours as needed., Disp: 90 tablet, Rfl: 0 .  allopurinol (ZYLOPRIM) 300 MG tablet, TAKE 1 TABLET(300 MG) BY MOUTH TWICE DAILY, Disp: 180 tablet, Rfl: 3 .  aspirin 81 MG chewable tablet, Chew 81 mg by mouth daily., Disp: , Rfl:  .  blood glucose meter kit and supplies, Dispense based on patient and insurance preference. Use up to four times daily as directed.  (FOR ICD-10 E10.9, E11.9). Check fsbs up to twice daily, Disp: 1 each, Rfl: 0 .  Blood Glucose Monitoring Suppl (FREESTYLE LITE) DEVI, 1 each by Does not apply route daily., Disp: 1 each, Rfl: 0 .  Certolizumab Pegol 2 X 200 MG/ML KIT, Inject 1 each into the skin every 14 (fourteen) days., Disp: , Rfl:  .  Cholecalciferol 25 MCG (1000 UT) tablet, Take by mouth., Disp: , Rfl:  .  Coenzyme Q10 (CO Q 10 PO), Take by mouth daily., Disp: , Rfl:  .  diclofenac Sodium (VOLTAREN) 1 % GEL, Apply 2 g topically 4 (four) times daily., Disp: 100 g, Rfl: 2 .  fluconazole (DIFLUCAN) 150 MG tablet, TAKE 1 TABLET BY MOUTH EVERY OTHER DAY AS NEEDED, Disp: 9 tablet, Rfl: 0 .  fluticasone (FLONASE) 50 MCG/ACT nasal spray, SHAKE LIQUID AND USE 2 SPRAYS IN EACH NOSTRIL  DAILY, Disp: 48 g, Rfl: 0 .  hydroxychloroquine (PLAQUENIL) 200 MG tablet, Take 1 tablet by mouth in the morning and at bedtime., Disp: , Rfl:  .  Lancets (ONETOUCH DELICA PLUS [id number]) MISC, TEST UP TO FOUR TIMES DAILY, Disp: 100 each, Rfl: 11 .  montelukast (SINGULAIR) 10 MG tablet, Take 1 tablet (10 mg total) by mouth at bedtime., Disp: 90 tablet, Rfl: 1 .  omega-3 acid ethyl esters (LOVAZA) 1 g capsule, TAKE 2 CAPSULES BY MOUTH TWICE DAILY, Disp: 360 capsule, Rfl: 2 .  ONETOUCH ULTRA test strip, TEST UP TO FOUR TIMES DAILY, Disp: 300 strip, Rfl: 1 .  polyethylene glycol powder (GLYCOLAX/MIRALAX) 17 GM/SCOOP powder, Take 17 g by mouth daily as needed for mild constipation or moderate constipation., Disp: 850 g, Rfl: 0 .  pregabalin (LYRICA) 200 MG capsule, TAKE 1 CAPSULE(200 MG) BY MOUTH TWICE DAILY (Patient taking differently: Taking 17m in AM, 1554min PM), Disp: 180 capsule, Rfl: 0 .  Vitamin D, Ergocalciferol, 50 MCG (2000 UT) CAPS, Take 1 capsule by mouth daily., Disp: 90 capsule, Rfl: 1 .  amLODipine-olmesartan (AZOR) 5-40 MG tablet, Take 1 tablet by mouth daily., Disp: 90 tablet, Rfl: 1 .  Empagliflozin-metFORMIN HCl ER (SYNJARDY XR) 12.12-998  MG TB24, Take 2 tablets by mouth daily., Disp: 180 tablet, Rfl: 1 .  levocetirizine (XYZAL) 5 MG tablet, Take 1 tablet (5 mg total) by mouth every evening., Disp: 90 tablet, Rfl: 0 .  rosuvastatin (CRESTOR) 40 MG tablet, Take 1 tablet (40 mg total) by mouth daily., Disp: 90 tablet, Rfl: 1 .  Semaglutide, 1 MG/DOSE, (OZEMPIC, 1 MG/DOSE,) 4 MG/3ML SOPN, Inject 1 mg into the skin once a week., Disp: 9 mL, Rfl: 0  Allergies  Allergen Reactions  . Ace Inhibitors Cough  . Lipofen [Fenofibrate] Other (See Comments)    Localized drug reaction, blister on back  . Penicillins Itching  . Sulfa Antibiotics Itching    I personally reviewed active problem list, medication list, allergies, family history, social history, health maintenance, notes from last encounter with the patient/caregiver today.   ROS  Constitutional: Negative for fever , positive for  weight change - loss   Respiratory: Negative for cough and shortness of breath.   Cardiovascular: Negative for chest pain or palpitations.  Gastrointestinal: Negative for abdominal pain, no bowel changes.  Musculoskeletal: Positive for gait problem and hand  joint swelling.  Skin: Negative for rash.  Neurological: Negative for dizziness or headache.  No other specific complaints in a complete review of systems (except as listed in HPI above).  Objective  Vitals:   09/25/20 0845  BP: 132/70  Pulse: 88  Resp: 16  Temp: 98.1 F (36.7 C)  TempSrc: Oral  SpO2: 98%  Weight: 207 lb (93.9 kg)  Height: _0  (1.702 m)    Body mass index is 32.42 kg/m.  Physical Exam  Constitutional: Patient appears well-developed and well-nourished. Obese  No distress.  HEENT: head atraumatic, normocephalic, pupils equal and reactive to light,  neck supple Cardiovascular: Normal rate, regular rhythm and normal heart sounds.  No murmur heard. No BLE edema. Pulmonary/Chest: Effort normal and breath sounds normal. No respiratory distress. Muscular  Skeletal: some hand deformity from RA and also some hand swelling  Abdominal: Soft.  There is no tenderness. Psychiatric: Patient has a normal mood and affect. behavior is normal. Judgment and thought content normal.  Recent Results (from the past 2160 hour(s))  POCT HgB A1C     Status: Abnormal  Collection Time: 06/30/20  9:10 AM  Result Value Ref Range   Hemoglobin A1C 7.6 (A) 4.0 - 5.6 %   HbA1c POC (<> result, manual entry)     HbA1c, POC (prediabetic range)     HbA1c, POC (controlled diabetic range)    POCT HgB A1C     Status: Abnormal   Collection Time: 09/25/20  8:48 AM  Result Value Ref Range   Hemoglobin A1C 6.3 (A) 4.0 - 5.6 %   HbA1c POC (<> result, manual entry)     HbA1c, POC (prediabetic range)     HbA1c, POC (controlled diabetic range)      Diabetic Foot Exam: Diabetic Foot Exam - Simple   Simple Foot Form Diabetic Foot exam was performed with the following findings: Yes 09/25/2020  9:33 AM  Visual Inspection See comments: Yes Sensation Testing Intact to touch and monofilament testing bilaterally: Yes Pulse Check Posterior Tibialis and Dorsalis pulse intact bilaterally: Yes Comments Thick toenails       PHQ2/9: Depression screen [facility] 2/9 09/25/2020 06/30/2020 03/27/2020 12/26/2019 09/03/2019  Decreased Interest 0 0 0 0 0  Down, Depressed, Hopeless 0 0 0 0 0  PHQ - 2 Score 0 0 0 0 0  Altered sleeping - - 0 0 0  Tired, decreased energy - - 0 0 0  Change in appetite - - 1 0 0  Feeling bad or failure about yourself  - - 0 0 0  Trouble concentrating - - 0 0 0  Moving slowly or fidgety/restless - - 0 0 0  Suicidal thoughts - - 0 0 0  PHQ-9 Score - - 1 0 0  Difficult doing work/chores - - Somewhat difficult Not difficult at all -  Some recent data might be hidden    phq 9 is negative  Fall Risk: Fall Risk  09/25/2020 06/30/2020 03/27/2020 12/26/2019 09/03/2019  Falls in the past year? 0 0 0 0 0  Number falls in past yr: 0 0 0 0 0  Injury with Fall? 0 0 0 0 0   Follow up - - - Falls evaluation completed -    Functional Status Survey: Is the patient deaf or have difficulty hearing?: Yes Does the patient have difficulty seeing, even when wearing glasses/contacts?: No Does the patient have difficulty concentrating, remembering, or making decisions?: No Does the patient have difficulty walking or climbing stairs?: No Does the patient have difficulty dressing or bathing?: No Does the patient have difficulty doing errands alone such as visiting a doctor's office or shopping?: No    Assessment & Plan  1. Diabetes mellitus type 2 in obese (HCC)  - HM Diabetes Foot Exam - POCT HgB A1C - Empagliflozin-metFORMIN HCl ER (SYNJARDY XR) 12.12-998 MG TB24; Take 2 tablets by mouth daily.  Dispense: 180 tablet; Refill: 1 - Semaglutide, 1 MG/DOSE, (OZEMPIC, 1 MG/DOSE,) 4 MG/3ML SOPN; Inject 1 mg into the skin once a week.  Dispense: 9 mL; Refill: 0  2. Controlled gout   3. Hypertension, benign  - amLODipine-olmesartan (AZOR) 5-40 MG tablet; Take 1 tablet by mouth daily.  Dispense: 90 tablet; Refill: 1  4. Perennial allergic rhinitis  - levocetirizine (XYZAL) 5 MG tablet; Take 1 tablet (5 mg total) by mouth every evening.  Dispense: 90 tablet; Refill: 0  5. Dyslipidemia associated with type 2 diabetes mellitus (HCC)  - rosuvastatin (CRESTOR) 40 MG tablet; Take 1 tablet (40 mg total) by mouth daily.  Dispense: 90 tablet; Refill: 1

## 2020-10-05 ENCOUNTER — Other Ambulatory Visit: Payer: Self-pay | Admitting: Family Medicine

## 2020-10-05 DIAGNOSIS — J3089 Other allergic rhinitis: Secondary | ICD-10-CM

## 2020-10-05 NOTE — Telephone Encounter (Signed)
Requested Prescriptions  Pending Prescriptions Disp Refills  . fluticasone (FLONASE) 50 MCG/ACT nasal spray [Pharmacy Med Name: FLUTICASONE 50MCG NASAL SP (120) RX] 48 g 0    Sig: SHAKE LIQUID AND USE 2 SPRAYS IN EACH NOSTRIL DAILY     Ear, Nose, and Throat: Nasal Preparations - Corticosteroids Passed - 10/05/2020 10:53 AM      Passed - Valid encounter within last 12 months    Recent Outpatient Visits          1 week ago Diabetes mellitus type 2 in obese [facility])   [facility] Steele Sizer, MD   3 months ago Diabetes mellitus type 2 in obese [facility])   [facility] [place], Drue Stager, MD   6 months ago Diabetes mellitus type 2 in obese [facility])   [facility] Steele Sizer, MD   9 months ago Dyslipidemia associated with type 2 diabetes mellitus [facility])   [facility] Steele Sizer, MD   1 year ago Chronic hip pain, bilateral   [facility] Steele Sizer, MD      Future Appointments            In 4 months Ancil Boozer, Drue Stager, MD [facility], [facility]

## 2020-10-30 ENCOUNTER — Other Ambulatory Visit: Payer: Self-pay | Admitting: Family Medicine

## 2020-10-30 DIAGNOSIS — M109 Gout, unspecified: Secondary | ICD-10-CM

## 2020-10-30 NOTE — Telephone Encounter (Signed)
Requested Prescriptions  Pending Prescriptions Disp Refills  . allopurinol (ZYLOPRIM) 300 MG tablet [Pharmacy Med Name: ALLOPURINOL 300MG  TABLETS] 180 tablet 3    Sig: TAKE 1 TABLET(300 MG) BY MOUTH TWICE DAILY     Endocrinology:  Gout Agents Failed - 10/30/2020  3:16 AM      Failed - Uric Acid in normal range and within 360 days    Uric Acid  Date Value Ref Range Status  12/08/2017 3.4 2.5 - 7.1 mg/dL Final    Comment:               Therapeutic target for gout patients: <6.0         Passed - Cr in normal range and within 360 days    Creatinine, Ser  Date Value Ref Range Status  03/27/2020 0.79 0.57 - 1.00 mg/dL Final   Creatinine, Urine  Date Value Ref Range Status  06/05/2018 63 20 - 275 mg/dL Final         Passed - Valid encounter within last 12 months    Recent Outpatient Visits          1 month ago Diabetes mellitus type 2 in obese [facility])   [facility] Steele Sizer, MD   4 months ago Diabetes mellitus type 2 in obese [facility])   [facility] Steele Sizer, MD   7 months ago Diabetes mellitus type 2 in obese [facility])   [facility] [place], Drue Stager, MD   10 months ago Dyslipidemia associated with type 2 diabetes mellitus [facility])   [facility] Steele Sizer, MD   1 year ago Chronic hip pain, bilateral   [facility] Steele Sizer, MD      Future Appointments            In 3 months Ancil Boozer, Drue Stager, MD [facility], [facility]

## 2020-11-14 ENCOUNTER — Other Ambulatory Visit: Payer: Self-pay

## 2020-11-14 ENCOUNTER — Ambulatory Visit
Admission: RE | Admit: 2020-11-14 | Discharge: 2020-11-14 | Disposition: A | Discharge status: Home / Self Care | Payer: Managed Care, Other (non HMO) | Source: Ambulatory Visit | Attending: Family Medicine | Admitting: Family Medicine

## 2020-11-14 DIAGNOSIS — Z1231 Encounter for screening mammogram for malignant neoplasm of breast: Secondary | ICD-10-CM | POA: Diagnosis not present

## 2020-11-27 ENCOUNTER — Other Ambulatory Visit: Payer: Self-pay | Admitting: Orthopedic Surgery

## 2020-12-03 ENCOUNTER — Other Ambulatory Visit: Payer: Self-pay | Admitting: Family Medicine

## 2020-12-03 DIAGNOSIS — B373 Candidiasis of vulva and vagina: Secondary | ICD-10-CM

## 2020-12-03 DIAGNOSIS — B3731 Acute candidiasis of vulva and vagina: Secondary | ICD-10-CM

## 2020-12-03 NOTE — Telephone Encounter (Signed)
Requested medication (s) are due for refill today: yes  Requested medication (s) are on the active medication list: yes  Last refill:  09/11/20  Future visit scheduled: yes  Notes to clinic:  no assigned protocol    Requested Prescriptions  Pending Prescriptions Disp Refills   fluconazole (DIFLUCAN) 150 MG tablet [Pharmacy Med Name: FLUCONAZOLE 150MG  TABLETS] 9 tablet 0    Sig: TAKE 1 TABLET BY MOUTH EVERY OTHER DAY AS NEEDED      Off-Protocol Failed - 12/03/2020  9:51 AM      Failed - Medication not assigned to a protocol, review manually.      Passed - Valid encounter within last 12 months    Recent Outpatient Visits           2 months ago Diabetes mellitus type 2 in obese [facility])   [facility] [place], Drue Stager, MD   5 months ago Diabetes mellitus type 2 in obese [facility])   [facility] [place], Drue Stager, MD   8 months ago Diabetes mellitus type 2 in obese [facility])   [facility] Steele Sizer, MD   11 months ago Dyslipidemia associated with type 2 diabetes mellitus [facility])   [facility] Steele Sizer, MD   1 year ago Chronic hip pain, bilateral   [facility] Steele Sizer, MD       Future Appointments             In 2 months Ancil Boozer, Drue Stager, MD [facility], [facility]

## 2020-12-03 NOTE — Telephone Encounter (Signed)
Pt has an appt on 02/02/21

## 2020-12-05 ENCOUNTER — Other Ambulatory Visit
Admission: RE | Admit: 2020-12-05 | Discharge: 2020-12-05 | Disposition: A | Discharge status: Home / Self Care | Payer: Managed Care, Other (non HMO) | Source: Ambulatory Visit | Attending: Orthopedic Surgery | Admitting: Orthopedic Surgery

## 2020-12-05 ENCOUNTER — Other Ambulatory Visit: Payer: Self-pay

## 2020-12-05 HISTORY — DX: Unspecified osteoarthritis, unspecified site: M19.90

## 2020-12-05 NOTE — Patient Instructions (Addendum)
Your procedure is scheduled on: Tuesday Dec 16, 2020. Report to Day Surgery inside [facility] 2nd floor stop by admissions desk first before getting on elevator). To find out your arrival time please call [phone] between 1PM - 3PM on Monday Dec 15, 2020.  Remember: Instructions that are not followed completely may result in serious medical risk,  up to and including death, or upon the discretion of your surgeon and anesthesiologist your  surgery may need to be rescheduled.     _X__ 1. Do not eat food after midnight the night before your procedure.                 No chewing gum or hard candies. You may drink clear liquids up to 2 hours                 before you are scheduled to arrive for your surgery- DO not drink clear                 liquids within 2 hours of the start of your surgery.                 Clear Liquids include:  water, G2 or                  Gatorade Zero (avoid Red/Purple/Blue), Black Coffee or Tea (Do not add                 anything to coffee or tea).  __X__2.  On the morning of surgery brush your teeth with toothpaste and water, you                may rinse your mouth with mouthwash if you wish.  Do not swallow any toothpaste of mouthwash.     _X__ 3.  No Alcohol for 24 hours before or after surgery.   _X__ 4.  Do Not Smoke or use e-cigarettes For 24 Hours Prior to Your Surgery.                 Do not use any chewable tobacco products for at least 6 hours prior to                 Surgery.  _X__  5.  Do not use any recreational drugs (marijuana, cocaine, heroin, ecstasy, MDMA or other)                For at least one week prior to your surgery.  Combination of these drugs with anesthesia                May have life threatening results.  __X__6.  Notify your doctor if there is any change in your medical condition      (cold, fever, infections).     Do not wear jewelry, make-up, hairpins, clips or nail polish. Do not wear lotions,  powders, or perfumes.  Do not shave 48 hours prior to surgery.  Do not bring valuables to the hospital.    [facility] is not responsible for any belongings or valuables.  Contacts, dentures or bridgework may not be worn into surgery. Leave your suitcase in the car. After surgery it may be brought to your room. For patients admitted to the hospital, discharge time is determined by your treatment team.   Patients discharged the day of surgery will not be allowed to drive home.   Make arrangements for someone to be with you for the first 24  hours of your Same Day Discharge.   __X__ Take these medicines the morning of surgery with A SIP OF WATER:    1. allopurinol (ZYLOPRIM) 300 MG  2. acetaminophen (TYLENOL) 500 MG  3. pregabalin (LYRICA) 75 MG  4. hydroxychloroquine (PLAQUENIL) 200 MG  5.  6.  ____ Fleet Enema (as directed)   __X__ Use CHG Soap (or wipes) as directed  ____ Use Benzoyl Peroxide Gel as instructed  ____ Use inhalers on the day of surgery  __X__ Stop Empagliflozin-metFORMIN HCl ER (SYNJARDY XR) 12.12-998 MG TB24 2 days prior to surgery (last day will be Saturday 12/13/20)    ____ Take 1/2 of usual insulin dose the night before surgery. No insulin the morning          of surgery.   __X__ Stop aspirin 81 MG  as instructed   __X__ One Week prior to surgery- Stop Anti-inflammatories such as Ibuprofen, Aleve, Advil, Motrin, meloxicam (MOBIC), diclofenac, etodolac, ketorolac, Toradol, Daypro, piroxicam, Goody's or BC powders. OK TO USE TYLENOL IF NEEDED   __X__ Stop supplements until after surgery.  Coenzyme Q10 (COQ10) 100 MG,omega-3 acid ethyl esters (LOVAZA) 1 g and Multiple Vitamin (MULTIVITAMIN WITH MINERALS)  ____ Bring C-Pap to the hospital.    If you have any questions regarding your pre-procedure instructions,  Please call Pre-admit Testing at [phone].

## 2020-12-10 ENCOUNTER — Other Ambulatory Visit: Payer: Managed Care, Other (non HMO)

## 2020-12-10 ENCOUNTER — Other Ambulatory Visit: Payer: Self-pay

## 2020-12-10 ENCOUNTER — Other Ambulatory Visit
Admission: RE | Admit: 2020-12-10 | Discharge: 2020-12-10 | Disposition: A | Discharge status: Home / Self Care | Payer: Managed Care, Other (non HMO) | Source: Ambulatory Visit | Attending: Orthopedic Surgery | Admitting: Orthopedic Surgery

## 2020-12-10 DIAGNOSIS — I10 Essential (primary) hypertension: Secondary | ICD-10-CM | POA: Diagnosis not present

## 2020-12-10 DIAGNOSIS — Z01818 Encounter for other preprocedural examination: Secondary | ICD-10-CM | POA: Diagnosis present

## 2020-12-10 LAB — BASIC METABOLIC PANEL
Anion gap: 11 (ref 5–15)
BUN: 15 mg/dL (ref 8–23)
CO2: 26 mmol/L (ref 22–32)
Calcium: 9.6 mg/dL (ref 8.9–10.3)
Chloride: 103 mmol/L (ref 98–111)
Creatinine, Ser: 0.56 mg/dL (ref 0.44–1.00)
GFR, Estimated: >60 mL/min (ref >60)
Glucose, Bld: 128 mg/dL — ABNORMAL HIGH (ref 70–99)
Potassium: 3.7 mmol/L (ref 3.5–5.1)
Sodium: 140 mmol/L (ref 135–145)

## 2020-12-10 LAB — CBC
HCT: 38 % (ref 36.0–46.0)
Hemoglobin: 12.8 g/dL (ref 12.0–15.0)
MCH: 30.7 pg (ref 26.0–34.0)
MCHC: 33.7 g/dL (ref 30.0–36.0)
MCV: 91.1 fL (ref 80.0–100.0)
Platelets: 234 10*3/uL (ref 150–400)
RBC: 4.17 MIL/uL (ref 3.87–5.11)
RDW: 13.6 % (ref 11.5–15.5)
WBC: 5.1 10*3/uL (ref 4.0–10.5)
nRBC: 0 % (ref 0.0–0.2)

## 2020-12-14 ENCOUNTER — Other Ambulatory Visit: Payer: Self-pay | Admitting: Family Medicine

## 2020-12-14 DIAGNOSIS — E1169 Type 2 diabetes mellitus with other specified complication: Secondary | ICD-10-CM

## 2020-12-15 ENCOUNTER — Other Ambulatory Visit: Payer: Self-pay | Admitting: Family Medicine

## 2020-12-15 DIAGNOSIS — J3089 Other allergic rhinitis: Secondary | ICD-10-CM

## 2020-12-15 MED ORDER — CEFAZOLIN SODIUM-DEXTROSE 2-4 GM/100ML-% IV SOLN
2.0000 g | INTRAVENOUS | Status: AC
  Administered 2020-12-16: 2 g via INTRAVENOUS

## 2020-12-15 MED ORDER — FAMOTIDINE 20 MG PO TABS: 20.0000 mg | ORAL_TABLET | Freq: Once | ORAL | Status: AC

## 2020-12-15 MED ORDER — ORAL CARE MOUTH RINSE: 15.0000 mL | Freq: Once | OROMUCOSAL | Status: DC

## 2020-12-15 MED ORDER — SODIUM CHLORIDE 0.9 % IV SOLN: INTRAVENOUS | Status: DC

## 2020-12-15 MED ORDER — CHLORHEXIDINE GLUCONATE 0.12 % MT SOLN: 15.0000 mL | Freq: Once | OROMUCOSAL | Status: DC

## 2020-12-16 ENCOUNTER — Ambulatory Visit: Payer: Managed Care, Other (non HMO) | Admitting: Anesthesiology

## 2020-12-16 ENCOUNTER — Encounter
Admission: RE | Disposition: A | Discharge status: Home / Self Care | Payer: Self-pay | Source: Home / Self Care | Attending: Orthopedic Surgery

## 2020-12-16 ENCOUNTER — Ambulatory Visit
Admission: RE | Admit: 2020-12-16 | Discharge: 2020-12-16 | Disposition: A | Discharge status: Home / Self Care | Payer: Managed Care, Other (non HMO) | Attending: Orthopedic Surgery | Admitting: Orthopedic Surgery

## 2020-12-16 ENCOUNTER — Other Ambulatory Visit: Payer: Self-pay

## 2020-12-16 DIAGNOSIS — Z96642 Presence of left artificial hip joint: Secondary | ICD-10-CM | POA: Diagnosis not present

## 2020-12-16 DIAGNOSIS — Z79899 Other long term (current) drug therapy: Secondary | ICD-10-CM | POA: Insufficient documentation

## 2020-12-16 DIAGNOSIS — Z880 Allergy status to penicillin: Secondary | ICD-10-CM | POA: Insufficient documentation

## 2020-12-16 DIAGNOSIS — E1142 Type 2 diabetes mellitus with diabetic polyneuropathy: Secondary | ICD-10-CM | POA: Diagnosis not present

## 2020-12-16 DIAGNOSIS — Z882 Allergy status to sulfonamides status: Secondary | ICD-10-CM | POA: Insufficient documentation

## 2020-12-16 DIAGNOSIS — G5621 Lesion of ulnar nerve, right upper limb: Secondary | ICD-10-CM | POA: Diagnosis not present

## 2020-12-16 DIAGNOSIS — Z7982 Long term (current) use of aspirin: Secondary | ICD-10-CM | POA: Diagnosis not present

## 2020-12-16 HISTORY — PX: ANTERIOR INTEROSSEOUS NERVE DECOMPRESSION: SHX5735

## 2020-12-16 LAB — GLUCOSE, CAPILLARY
Glucose-Capillary: 121 mg/dL — ABNORMAL HIGH (ref 70–99)
Glucose-Capillary: 106 mg/dL — ABNORMAL HIGH (ref 70–99)

## 2020-12-16 SURGERY — ANTERIOR INTEROSSEOUS NERVE DECOMPRESSION
Anesthesia: General | Laterality: Right

## 2020-12-16 MED ORDER — OXYCODONE HCL 5 MG PO TABS: 5.0000 mg | ORAL_TABLET | Freq: Once | ORAL | Status: DC | PRN

## 2020-12-16 MED ORDER — ONDANSETRON HCL 4 MG/2ML IJ SOLN
INTRAMUSCULAR | Status: DC | PRN
  Administered 2020-12-16: 4 mg via INTRAVENOUS

## 2020-12-16 MED ORDER — PROPOFOL 10 MG/ML IV BOLUS
INTRAVENOUS | Status: DC | PRN
  Administered 2020-12-16: 140 mg via INTRAVENOUS
  Administered 2020-12-16: 60 mg via INTRAVENOUS

## 2020-12-16 MED ORDER — PROPOFOL 10 MG/ML IV BOLUS
INTRAVENOUS | Status: AC
  Filled 2020-12-16: qty 20

## 2020-12-16 MED ORDER — FAMOTIDINE 20 MG PO TABS
ORAL_TABLET | ORAL | Status: AC
  Administered 2020-12-16: 20 mg via ORAL
  Filled 2020-12-16: qty 1

## 2020-12-16 MED ORDER — BUPIVACAINE HCL (PF) 0.5 % IJ SOLN
INTRAMUSCULAR | Status: AC
  Filled 2020-12-16: qty 30

## 2020-12-16 MED ORDER — FENTANYL CITRATE (PF) 100 MCG/2ML IJ SOLN: 25.0000 ug | INTRAMUSCULAR | Status: DC | PRN

## 2020-12-16 MED ORDER — SODIUM CHLORIDE FLUSH 0.9 % IV SOLN
INTRAVENOUS | Status: AC
  Filled 2020-12-16: qty 10

## 2020-12-16 MED ORDER — CEFAZOLIN SODIUM-DEXTROSE 2-4 GM/100ML-% IV SOLN
INTRAVENOUS | Status: AC
  Filled 2020-12-16: qty 100

## 2020-12-16 MED ORDER — ONDANSETRON HCL 4 MG/2ML IJ SOLN: 4.0000 mg | Freq: Once | INTRAMUSCULAR | Status: DC | PRN

## 2020-12-16 MED ORDER — PHENYLEPHRINE HCL (PRESSORS) 10 MG/ML IV SOLN
INTRAVENOUS | Status: DC | PRN
  Administered 2020-12-16: 200 ug via INTRAVENOUS
  Administered 2020-12-16 (×3): 100 ug via INTRAVENOUS
  Administered 2020-12-16: 200 ug via INTRAVENOUS

## 2020-12-16 MED ORDER — LIDOCAINE HCL (CARDIAC) PF 100 MG/5ML IV SOSY
PREFILLED_SYRINGE | INTRAVENOUS | Status: DC | PRN
  Administered 2020-12-16: 40 mg via INTRAVENOUS

## 2020-12-16 MED ORDER — OXYCODONE HCL 5 MG/5ML PO SOLN
5.0000 mg | Freq: Once | ORAL | Status: DC | PRN
Start: 2020-12-16 — End: 2020-12-16

## 2020-12-16 MED ORDER — ACETAMINOPHEN 10 MG/ML IV SOLN
INTRAVENOUS | Status: AC
  Administered 2020-12-16: 1000 mg via INTRAVENOUS
  Filled 2020-12-16: qty 100

## 2020-12-16 MED ORDER — ONDANSETRON HCL 4 MG/2ML IJ SOLN
INTRAMUSCULAR | Status: AC
  Filled 2020-12-16: qty 2

## 2020-12-16 MED ORDER — FENTANYL CITRATE (PF) 100 MCG/2ML IJ SOLN
INTRAMUSCULAR | Status: AC
  Filled 2020-12-16: qty 2

## 2020-12-16 MED ORDER — FENTANYL CITRATE (PF) 100 MCG/2ML IJ SOLN
INTRAMUSCULAR | Status: DC | PRN
  Administered 2020-12-16: 25 ug via INTRAVENOUS

## 2020-12-16 MED ORDER — ACETAMINOPHEN 10 MG/ML IV SOLN: 1000.0000 mg | Freq: Once | INTRAVENOUS | Status: DC | PRN

## 2020-12-16 MED ORDER — LIDOCAINE HCL (PF) 2 % IJ SOLN
INTRAMUSCULAR | Status: AC
  Filled 2020-12-16: qty 5

## 2020-12-16 MED ORDER — CHLORHEXIDINE GLUCONATE 0.12 % MT SOLN
OROMUCOSAL | Status: AC
  Filled 2020-12-16: qty 15

## 2020-12-16 MED ORDER — MIDAZOLAM HCL 2 MG/2ML IJ SOLN
INTRAMUSCULAR | Status: AC
  Filled 2020-12-16: qty 2

## 2020-12-16 MED ORDER — HYDROCODONE-ACETAMINOPHEN 5-325 MG PO TABS: 1.0000 | ORAL_TABLET | ORAL | 0 refills | Status: DC | PRN

## 2020-12-16 SURGICAL SUPPLY — 40 items
APL PRP STRL LF DISP 70% ISPRP (MISCELLANEOUS) ×1
BNDG COHESIVE 4X5 TAN STRL (GAUZE/BANDAGES/DRESSINGS) IMPLANT
BNDG ESMARK 4X12 TAN STRL LF (GAUZE/BANDAGES/DRESSINGS) ×2 IMPLANT
CANISTER SUCT 1200ML W/VALVE (MISCELLANEOUS) ×2 IMPLANT
CHLORAPREP W/TINT 26 (MISCELLANEOUS) ×2 IMPLANT
COVER WAND RF STERILE (DRAPES) ×2 IMPLANT
CUFF TOURN SGL QUICK 18X4 (TOURNIQUET CUFF) IMPLANT
CUFF TOURN SGL QUICK 24 (TOURNIQUET CUFF)
CUFF TRNQT CYL 24X4X16.5-23 (TOURNIQUET CUFF) IMPLANT
ELECT CAUTERY BLADE 6.4 (BLADE) ×2 IMPLANT
ELECT REM PT RETURN 9FT ADLT (ELECTROSURGICAL) ×2
ELECTRODE REM PT RTRN 9FT ADLT (ELECTROSURGICAL) ×1 IMPLANT
GAUZE SPONGE 4X4 12PLY STRL (GAUZE/BANDAGES/DRESSINGS) ×2 IMPLANT
GAUZE XEROFORM 1X8 LF (GAUZE/BANDAGES/DRESSINGS) ×2 IMPLANT
GLOVE SURG SYN 9.0  PF PI (GLOVE) ×1
GLOVE SURG SYN 9.0 PF PI (GLOVE) ×1 IMPLANT
GOWN SRG 2XL LVL 4 RGLN SLV (GOWNS) ×1 IMPLANT
GOWN STRL NON-REIN 2XL LVL4 (GOWNS) ×2
GOWN STRL REUS W/ TWL LRG LVL3 (GOWN DISPOSABLE) ×1 IMPLANT
GOWN STRL REUS W/TWL LRG LVL3 (GOWN DISPOSABLE) ×2
KIT TURNOVER KIT A (KITS) ×2 IMPLANT
LOOP RED MAXI  1X406MM (MISCELLANEOUS) ×1
LOOP VESSEL MAXI 1X406 RED (MISCELLANEOUS) ×1 IMPLANT
MANIFOLD NEPTUNE II (INSTRUMENTS) ×2 IMPLANT
NEEDLE FILTER BLUNT 18X 1/2SAF (NEEDLE) ×1
NEEDLE FILTER BLUNT 18X1 1/2 (NEEDLE) ×1 IMPLANT
NS IRRIG 500ML POUR BTL (IV SOLUTION) ×2 IMPLANT
PACK EXTREMITY ARMC (MISCELLANEOUS) ×2 IMPLANT
PAD ABD DERMACEA PRESS 5X9 (GAUZE/BANDAGES/DRESSINGS) ×2 IMPLANT
PAD CAST CTTN 4X4 STRL (SOFTGOODS) ×2 IMPLANT
PADDING CAST COTTON 4X4 STRL (SOFTGOODS) ×4
SPONGE LAP 18X18 RF (DISPOSABLE) ×2 IMPLANT
STOCKINETTE IMPERVIOUS 9X36 MD (GAUZE/BANDAGES/DRESSINGS) IMPLANT
STRIP CLOSURE SKIN 1/2X4 (GAUZE/BANDAGES/DRESSINGS) ×2 IMPLANT
SUT ETHILON 4-0 (SUTURE) ×2
SUT ETHILON 4-0 FS2 18XMFL BLK (SUTURE) ×1
SUT VIC AB 2-0 SH 27 (SUTURE)
SUT VIC AB 2-0 SH 27XBRD (SUTURE) IMPLANT
SUT VIC AB 3-0 PS2 18 (SUTURE) ×2 IMPLANT
SUTURE ETHLN 4-0 FS2 18XMF BLK (SUTURE) ×1 IMPLANT

## 2020-12-16 NOTE — Anesthesia Procedure Notes (Cosign Needed)
Procedure Name: LMA Insertion Date/Time: 12/16/2020 11:12 AM Performed by: Arita Miss, MD Pre-anesthesia Checklist: Emergency Drugs available, Patient identified, Patient being monitored, Timeout performed and Suction available Patient Re-evaluated:Patient Re-evaluated prior to induction Oxygen Delivery Method: Circle system utilized and Simple face mask Preoxygenation: Pre-oxygenation with 100% oxygen Induction Type: IV induction Ventilation: Mask ventilation without difficulty LMA: LMA inserted LMA Size: 4.5 Number of attempts: 1

## 2020-12-16 NOTE — Op Note (Signed)
12/16/2020  12:05 PM  PATIENT:  Maria Cruz  65 y.o. female  PRE-OPERATIVE DIAGNOSIS:  Cubital tunnel syndrome on right G56.21  POST-OPERATIVE DIAGNOSIS:  Cubital tunnel syndrome on right G56.21  PROCEDURE:  Procedure(s): Right ulnar nerve release, elbow (Right)  SURGEON: Laurene Footman, MD  ASSISTANTS: None  ANESTHESIA:   general  EBL:  Total I/O In: 900 [I.V.:900] Out: 15 [Blood:15]  BLOOD ADMINISTERED:none  DRAINS: none   LOCAL MEDICATIONS USED:  NONE  SPECIMEN:  No Specimen  DISPOSITION OF SPECIMEN:  N/A  COUNTS:  YES  TOURNIQUET:   27 minutes at 250 mmHg  IMPLANTS: None  DICTATION: .Dragon Dictation patient was brought to the operating room and after adequate general anesthesia obtained the right arm was prepped and draped in usual sterile fashion with a tourniquet applied the upper arm.  After patient identification and timeout procedures were completed tourniquet was raised.  A curvilinear incision approximately 4 cm in length was made between the medial epicondyle and the olecranon.  Subcutaneous tissue was spread and there was more muscle than usual from the medial epicondyle and this could actually cover the nerve.  Dissection was carried out proximally within the nerve identified and tracked distally with compression noted about a centimeter in length at the level of the medial epicondyle with a band after release getting good vascular blush distal to this area.  The nerve was then tracked down to where it went into the FCU and no further compression was identified proximally there did not appear to be any compression.  After was felt to be an adequate decompression the elbow was placed through range of motion and the knee nerve was stable.  The wound was then thoroughly irrigated and closed with 3-0 Vicryl subcutaneously followed by 4-0 nylon in a simple interrupted fashion.  Xeroform 4 x 4 web roll and Ace wrap applied tourniquet let down prior to application of  dressing.  PLAN OF CARE: Discharge to home after PACU  PATIENT DISPOSITION:  PACU - hemodynamically stable.

## 2020-12-16 NOTE — Anesthesia Postprocedure Evaluation (Signed)
Anesthesia Post Note  Patient: Maria Cruz  Procedure(s) Performed: Right ulnar nerve release, elbow (Right )  Patient location during evaluation: PACU Anesthesia Type: General Level of consciousness: awake and alert Pain management: pain level controlled Vital Signs Assessment: post-procedure vital signs reviewed and stable Respiratory status: spontaneous breathing, nonlabored ventilation, respiratory function stable and patient connected to nasal cannula oxygen Cardiovascular status: blood pressure returned to baseline and stable Postop Assessment: no apparent nausea or vomiting Anesthetic complications: no   No complications documented.   Last Vitals:  Vitals:   12/16/20 1230 12/16/20 1245  BP: 124/84 137/80  Pulse: 79 77  Resp: 17 18  Temp: 36.8 C (!) 36.1 C  SpO2: 98% 100%    Last Pain:  Vitals:   12/16/20 1245  TempSrc: Temporal  PainSc: 8                  Arita Miss

## 2020-12-16 NOTE — Anesthesia Preprocedure Evaluation (Signed)
Anesthesia Evaluation  Patient identified by MRN, date of birth, ID band Patient awake    Reviewed: Allergy & Precautions, NPO status , Patient's Chart, lab work & pertinent test results  History of Anesthesia Complications Negative for: history of anesthetic complications  Airway Mallampati: II  TM Distance: >3 FB Neck ROM: Full    Dental no notable dental hx. (+) Teeth Intact   Pulmonary neg pulmonary ROS, neg sleep apnea, neg COPD, Patient abstained from smoking.Not current smoker,    Pulmonary exam normal breath sounds clear to auscultation       Cardiovascular Exercise Tolerance: Good METShypertension, (-) CAD and (-) Past MI (-) dysrhythmias  Rhythm:Regular Rate:Normal - Systolic murmurs    Neuro/Psych negative neurological ROS  negative psych ROS   GI/Hepatic GERD  ,(+)     (-) substance abuse  ,   Endo/Other  diabetes  Renal/GU negative Renal ROS     Musculoskeletal   Abdominal   Peds  Hematology   Anesthesia Other Findings Past Medical History: No date: Allergy No date: Arthritis No date: Back pain with radiation No date: Calluse     Comment:  Foot No date: Colon polyp 2016: Diabetes mellitus without complication ([place])     Comment:  Type II  No date: Fatty liver No date: GERD (gastroesophageal reflux disease) 12/05/2019: History of total hip replacement, left     Comment:  [facility] No date: Hyperlipidemia No date: Hypertension No date: Lumbar radiculopathy     Comment:  Dr. Mack Guise No date: Obesity No date: Plantar fasciitis, left No date: Vitamin D deficiency  Reproductive/Obstetrics                            Anesthesia Physical Anesthesia Plan  ASA: II  Anesthesia Plan: General   Post-op Pain Management:    Induction: Intravenous  PONV Risk Score and Plan: 3 and Ondansetron, Dexamethasone and Midazolam  Airway  Management Planned: LMA  Additional Equipment: None  Intra-op Plan:   Post-operative Plan: Extubation in OR  Informed Consent: I have reviewed the patients History and Physical, chart, labs and discussed the procedure including the risks, benefits and alternatives for the proposed anesthesia with the patient or authorized representative who has indicated his/her understanding and acceptance.     Dental advisory given  Plan Discussed with: CRNA and Surgeon  Anesthesia Plan Comments: (Discussed risks of anesthesia with patient, including PONV, sore throat, lip/dental damage. Rare risks discussed as well, such as cardiorespiratory and neurological sequelae. Patient understands.)        Anesthesia Quick Evaluation

## 2020-12-16 NOTE — H&P (Signed)
Chief Complaint  Patient presents with  . History and Physical  right ulnar nerve release    History of the Present Illness: Maria Cruz is a 65 y.o. female here today for history and physical for right cubital tunnel release, date of surgery 12/16/2020 with Dr. Hessie Knows. Patient initially diagnosed with right ulnar neuritis, referred by Dr. Rosalia Hammers. Dr. Melrose Nakayama performed a nerve test on 06/24/2020 showing moderate right ulnar neuropathy with superimposed mild generalized polyneuropathy. She subsequently has seen Dr. Candelaria Stagers on 10/28/2020. The patient has numbness from her right small and ring fingers, mostly from the right elbow. This has been ongoing for 1 year without relief. She reports that she does not have strength in her right hand. She has difficulty extending the arm. She notes associated finger stiffness, swelling, tingling, and pain.   The patient has diabetes. She had an operation on her left arm on 12/05/2019.  She is retired.   I have reviewed past medical, surgical, social and family history, and allergies as documented in the EMR.  Past Medical History: Past Medical History:  Diagnosis Date  . Anemia  . Back pain  . Colon polyp  . Diabetes mellitus type 2, uncomplicated (CMS-HCC)  . Fatty liver  . GERD (gastroesophageal reflux disease)  . Gout  . Hyperlipidemia  . Hypertension  . Lumbar radiculopathy  . Obesity  . Plantar fasciitis  left foot  . Sinus tachycardia  . Vitamin D deficiency   Past Surgical History: Past Surgical History:  Procedure Laterality Date  . ABDOMINAL HYSTERECTOMY 2012  . APPENDECTOMY 2012  . APPENDECTOMY  . breast biopsy Right 07/12/2012  negative  . breast biopsy Left 05/09/2017  . COLONOSCOPY 2009  . COLONOSCOPY [id number]  . DILATION AND CURETTAGE, DIAGNOSTIC / THERAPEUTIC 2007  . ESSURE TUBAL LIGATION  . REPLACEMENT TOTAL HIP W/ RESURFACING IMPLANTS Left  . TUBAL LIGATION   Past Family History: Family History   Problem Relation Age of Onset  . Multiple sclerosis Mother  . Myocardial Infarction (Heart attack) Father  . Kidney failure Sister  . Myocardial Infarction (Heart attack) Brother  . Diabetes Daughter  . Rheum arthritis Sister  . Coronary Artery Disease (Blocked arteries around heart) Sister  . Lupus Sister  . Gout Paternal Grandmother   Medications: Current Outpatient Medications Ordered in Epic  Medication Sig Dispense Refill  . acetaminophen (TYLENOL EXTRA STRENGTH) 500 MG tablet Take 1 tablet by mouth every 6 (six) hours as needed  . allopurinol (ZYLOPRIM) 300 MG tablet Take 1 tablet by mouth 2 (two) times daily  . amLODIPine-olmesartan (AZOR) 5-40 mg tablet Take 1 tablet by mouth once daily  . aspirin 81 MG chewable tablet Take 1 tablet by mouth once daily  . atorvastatin (LIPITOR) 40 MG tablet atorvastatin 40 mg tablet  . blood glucose meter (FREESTYLE LITE METER) kit Use  . certolizumab pegoL (CIMZIA) 400 mg/2 mL (200 mg/mL x 2) injection kit Inject 2 mLs (400 mg total) subcutaneously every 28 (twenty-eight) days 1 kit 0  . cholecalciferol (CHOLECALCIFEROL) 1,000 unit tablet Take 1,000 Units by mouth once daily  . diclofenac (VOLTAREN) 1 % topical gel Apply 2 g topically 4 (four) times daily  . empagliflozin-metformin (SYNJARDY XR) 25-1,000 mg TBph Take 1 tablet by mouth once daily  . ergocalciferol, vitamin D2, 50 mcg (2,000 unit) Cap Take 2,000 Units by mouth once daily  . fluconazole (DIFLUCAN) 150 MG tablet Take 1 tablet by mouth every other day  . fluticasone propionate (FLONASE) 50  mcg/actuation nasal spray 2 sprays by Nasal route once daily as needed  . hydrOXYchloroQUINE (PLAQUENIL) 200 mg tablet Take 1 tablet (200 mg total) by mouth 2 (two) times daily 60 tablet 5  . lancets (FREESTYLE LANCETS) 28 gauge Misc Check blood sugars as needed.  Marland Kitchen levocetirizine (XYZAL) 5 MG tablet Take 1 tablet by mouth nightly  . montelukast (SINGULAIR) 10 mg tablet Take 1 tablet by mouth  once daily  . omega-3 acid ethyl esters (LOVAZA) 1 gram capsule Take 2 capsules by mouth 2 (two) times daily  . pioglitazone (ACTOS) 15 MG tablet Take 15 mg by mouth once daily  . polyethylene glycol (MIRALAX) powder Take 17 g by mouth once daily as needed  . pregabalin (LYRICA) 200 MG capsule Take 200 mg by mouth 2 (two) times daily  . pregabalin (LYRICA) 75 MG capsule Take 75 mg in the morning and 150 mg at night. 90 capsule 1  . rosuvastatin (CRESTOR) 40 MG tablet Take 40 mg by mouth once daily  . semaglutide (OZEMPIC) 1 mg/dose (4 mg/3 mL) PnIj Inject subcutaneously once a week  . ubidecarenone (COENZYME Q10 ORAL) Take 100 mg by mouth once daily   No current Epic-ordered facility-administered medications on file.   Allergies: Allergies  Allergen Reactions  . Ace Inhibitors Cough  . Fenofibrate Other (See Comments)  Localized drug reaction, blister on back  . Penicillins Itching  . Sulfa (Sulfonamide Antibiotics) Itching    Body mass index is 33.36 kg/m.  Review of Systems: A comprehensive 14 point ROS was performed, reviewed, and the pertinent orthopaedic findings are documented in the HPI.  Vitals:  12/05/20 1109  BP: (!) 142/82    General Physical Examination:  General:  Well developed, well nourished, no apparent distress, normal affect, normal gait with no antalgic component.   HEENT: Head normocephalic, atraumatic, PERRL.   Abdomen: Soft, non tender, non distended, Bowel sounds present.  Heart: Examination of the heart reveals regular, rate, and rhythm. There is no murmur noted on ascultation. There is a normal apical pulse.  Lungs: Lungs are clear to auscultation. There is no wheeze, rhonchi, or crackles. There is normal expansion of bilateral chest walls.   Musculoskeletal Examination:  On exam, there is significant right hand weakness. Unable to make a fist on the right. Significant stiffness of her right ring finger. Right ring finger flexion to 30  degrees at the IP joints. Positive Tinel's along the cubital tunnel.  Radiographs:  EMG nerve conduction studies reviewed by me today from 06/24/2020 show diagnostic evidence of moderate right ulnar neuropathy at elbow with superimposed mild generalized polyneuropathy    Assessment: ICD-10-CM  1. Cubital tunnel syndrome on right G56.21   Plan: 55. 65 year old female with greater than 1 year right upper extremity cubital tunnel syndrome. She has developed weakness and has had no relief with conservative treatment. Nerve conduction studies confirm moderate right cubital tunnel syndrome. Risks, benefits, complications of a right elbow cubital tunnel release have been discussed with the patient. Patient has agreed and consented procedure Dr. Hessie Knows on 12/16/2020. Patient will need hand therapy following surgery to regain hand function    Electronically signed by Feliberto Gottron, PA at 12/05/2020 11:15 AM EDT  Reviewed  H+P. No changes noted.

## 2020-12-16 NOTE — Discharge Instructions (Addendum)
Try not to lift too much with the right arm for 2 weeks. Okay to work on gripping the hand and bending elbow as tolerated. Leave dressing in place and keep clean and dry until return visit. Pain medicine as directed. Call office if you are having problems.  AMBULATORY SURGERY  DISCHARGE INSTRUCTIONS   1) The drugs that you were given will stay in your system until tomorrow so for the next 24 hours you should not:  A) Drive an automobile B) Make any legal decisions C) Drink any alcoholic beverage   2) You may resume regular meals tomorrow.  Today it is better to start with liquids and gradually work up to solid foods.  You may eat anything you prefer, but it is better to start with liquids, then soup and crackers, and gradually work up to solid foods.   3) Please notify your doctor immediately if you have any unusual bleeding, trouble breathing, redness and pain at the surgery site, drainage, fever, or pain not relieved by medication.    4) Additional Instructions:        Please contact your physician with any problems or Same Day Surgery at [phone], Monday through Friday 6 am to 4 pm, or [place] at [facility] number at [phone].

## 2020-12-16 NOTE — Transfer of Care (Cosign Needed)
Immediate Anesthesia Transfer of Care Note  Patient: Maria Cruz  Procedure(s) Performed: Right ulnar nerve release, elbow (Right )  Patient Location: PACU  Anesthesia Type:General  Level of Consciousness: awake and alert   Airway & Oxygen Therapy: pt breathing spontaneous and connected to o2 via a face maks  Post-op Assessment: Report given to RN and Post -op Vital signs reviewed and stable  Post vital signs: Reviewed and stable  Last Vitals:  Vitals Value Taken Time  BP 129/85 12/16/20 1206  Temp    Pulse 81 12/16/20 1208  Resp 18 12/16/20 1208  SpO2 100 % 12/16/20 1208  Vitals shown include unvalidated device data.  Last Pain:  Vitals:   12/16/20 0945  TempSrc: Oral  PainSc: 0-No pain         Complications: No complications documented.

## 2020-12-17 ENCOUNTER — Encounter: Payer: Self-pay | Admitting: Orthopedic Surgery

## 2020-12-29 ENCOUNTER — Encounter: Payer: Self-pay | Admitting: Occupational Therapy

## 2020-12-29 ENCOUNTER — Ambulatory Visit: Payer: Managed Care, Other (non HMO) | Attending: Orthopedic Surgery | Admitting: Occupational Therapy

## 2020-12-29 ENCOUNTER — Other Ambulatory Visit: Payer: Self-pay

## 2020-12-29 DIAGNOSIS — M25621 Stiffness of right elbow, not elsewhere classified: Secondary | ICD-10-CM | POA: Diagnosis present

## 2020-12-29 DIAGNOSIS — M6281 Muscle weakness (generalized): Secondary | ICD-10-CM | POA: Insufficient documentation

## 2020-12-29 DIAGNOSIS — M25521 Pain in right elbow: Secondary | ICD-10-CM | POA: Insufficient documentation

## 2020-12-29 DIAGNOSIS — M25631 Stiffness of right wrist, not elsewhere classified: Secondary | ICD-10-CM | POA: Diagnosis present

## 2020-12-29 DIAGNOSIS — M25641 Stiffness of right hand, not elsewhere classified: Secondary | ICD-10-CM | POA: Insufficient documentation

## 2020-12-29 DIAGNOSIS — R208 Other disturbances of skin sensation: Secondary | ICD-10-CM | POA: Diagnosis present

## 2020-12-29 NOTE — Therapy (Signed)
[facility] [address], [place], [address] Phone: [phone]   Fax:  [phone]  Occupational Therapy Evaluation  Patient Details  Name: Maria Cruz MRN: [id number] Date of Birth: [birth year] Referring Provider (OT): Dr Rudene Christians   Encounter Date: 12/29/2020   OT End of Session - 12/29/20 1300    Visit Number 1    Number of Visits 12    Date for OT Re-Evaluation 02/23/21    OT Start Time 1116    OT Stop Time 1200    OT Time Calculation (min) 44 min    Activity Tolerance Patient tolerated treatment well    Behavior During Therapy [facility] for tasks assessed/performed           Past Medical History:  Diagnosis Date  . Allergy   . Arthritis   . Back pain with radiation   . Calluse    Foot  . Colon polyp   . Diabetes mellitus without complication ([place]) 6962   Type II   . Fatty liver   . GERD (gastroesophageal reflux disease)   . History of total hip replacement, left 12/05/2019   [facility]  . Hyperlipidemia   . Hypertension   . Lumbar radiculopathy    Dr. Mack Guise  . Obesity   . Plantar fasciitis, left   . Vitamin D deficiency     Past Surgical History:  Procedure Laterality Date  . ABDOMINAL HYSTERECTOMY  2012  . ANTERIOR INTEROSSEOUS NERVE DECOMPRESSION Right 12/16/2020   Procedure: Right ulnar nerve release, elbow;  Surgeon: Hessie Knows, MD;  Location: [facility] ORS;  Service: Orthopedics;  Laterality: Right;  . APPENDECTOMY  2012  . BREAST BIOPSY Right 07/12/2012   Negative  . BREAST BIOPSY Left 05/09/2017   Affirm Bx of two areas-FIBROADENOMA WITH COARSE CALCIFICATION. NEGATIVE FOR ATYPIA,COLLAPSED CYST AND FIBROADENOMATOID CHANGE  . COLONOSCOPY  2009   Dr Allen Norris  . COLONOSCOPY WITH PROPOFOL N/A 07/27/2017   Procedure: COLONOSCOPY WITH PROPOFOL;  Surgeon: Robert Bellow, MD;  Location: [facility] ENDOSCOPY;  Service: Endoscopy;  Laterality: N/A;  .  DILATION AND CURETTAGE OF UTERUS  2007  . TUBAL LIGATION      There were no vitals filed for this visit.   Subjective Assessment - 12/29/20 1251    Subjective  My stitches come out tomorrow- elbow are stiff, sore but pain mostly in upper arm and still some pins and needles in pinkie and ring finger but better than it was prior to surgery    Pertinent History Had cubital tunnel symptoms for about year- and had cubital tunnel release on May 11th by Dr Rudene Christians- - refer to OT/hand therapy on 5/13 - pt report symptoms better but had issues for about year and so stiffness and weakness in R hand    Patient Stated Goals Want to be able to use my R hand and arm to do things around the house, and see if I can maybe get part time job again    Currently in Pain? Yes    Pain Score 6     Pain Location Elbow    Pain Orientation Right    Pain Descriptors / Indicators Aching;Tender;Tingling;Tightness    Pain Type Surgical pain    Pain Onset More than a month ago    Pain Frequency Intermittent             [facility] OT Assessment - 12/29/20 0001      Assessment  Medical Diagnosis R cubital tunnel release    Referring Provider (OT) Dr Rudene Christians    Onset Date/Surgical Date 12/17/20    Hand Dominance Right    Next MD Visit May 24th- for stitches removal    Prior Therapy PT for her L hip      Home  Environment   Lives With --   Daughter lives with her     Prior Function   Vocation Retired    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, 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 [facility] [employer] [facility], read,      AROM   Right Elbow Flexion 145    Right Elbow Extension 0    Right Wrist Extension 70 Degrees    Right Wrist Flexion 55 Degrees      Right Hand AROM   R Thumb Opposition to Index --   Opposition to 5th   R Index  MCP 0-90 70 Degrees    R Index PIP 0-100 90 Degrees    R Long  MCP 0-90 75 Degrees    R Long PIP 0-100 85 Degrees    R Ring  MCP 0-90 80 Degrees    R Ring PIP 0-100 80 Degrees   0   R Little  MCP 0-90 80  Degrees    R Little PIP 0-100 75 Degrees                    OT Treatments/Exercises (OP) - 12/29/20 0001      RUE Contrast Bath   Time 8 minutes    Comments prior to review of HEP - decrease pain / stiffness           Contrast - 2-3 x day  AROM - tendon glides 10 reps Oppositin to all digits- oval - 10 reps Ulnar N glide - 5 reps Pain free   Elbow pad fabricated  for daytime cushioning cubital tunnel and night time to decrease elbow flexion   Avoid propping up or sleeping in flexion or cradling hand - bend elbow        OT Education - 12/29/20 1259    Education Details findings of eval and HEP    Person(s) Educated Patient    Methods Explanation;Handout    Comprehension Verbal cues required;Returned demonstration;Verbalized understanding            OT Short Term Goals - 12/29/20 1351      OT SHORT TERM GOAL #1   Title Pt to be independent in HEP to decrease edema, pain and increase AROM to WNL in R UE to use in all ADL's    Baseline pain increase with ROM and Ulnar N glide to 6/10 - tenderness- edema increase 1 cm - AROM limited in elbow and wrist    Time 2    Period Weeks    Status New    Target Date 01/12/21             OT Long Term Goals - 12/29/20 1359      OT LONG TERM GOAL #1   Title R UE AROM and strength increase for pt to use normall in ADL:s and around the house without increase symptoms    Baseline flexion elbow 145, extention of elbow 0, but wrist decrease and composite Ulnar N glide - pain increase to 6/10 -    Time 4    Period Weeks    Status New    Target Date 01/26/21  OT LONG TERM GOAL #2   Title R grip strength increase to more than 75% compare to L hand to carry groceries without increase symptoms    Baseline Grip NT - but symptoms she had for year prior to surgery - ADD strength decrease in 5th    Time 6    Period Weeks    Status New    Target Date 02/09/21      OT LONG TERM GOAL #3   Title Pt R UE strength  increase for pt to use it in ADL's and iADL's without increase symptoms    Baseline pain increase 6/10 - composite Ulnar N glide- decrease flexion and wrist ROM - tenderness cubital tunnel - stitches remove tomorrow- ADD of 5th adn opposition decrease strength    Time 8    Period Weeks    Status New    Target Date 02/23/21                 Plan - 12/29/20 1345    Clinical Impression Statement Pt arrive at OT eval 12 days s/p R cubital tunnel release - pt report her symptoms improved since surgery - stitches coming out tomorrow -  but still with some tenderness, stiffness , sensory changes in 5th and 4th digits and weakness in R dominant UE - pt do have some arthritis in digits- show decrease ADD of 5th and opposition strength - in need for education on positioning of R UE - all limiting her functional use of R dominant hand in ADL's and IADL's - pt can benefit from OT services    OT Occupational Profile and History Problem Focused Assessment - Including review of records relating to presenting problem    Occupational performance deficits (Please refer to evaluation for details): ADL's;IADL's;Rest and Sleep;Play;Leisure    Body Structure / Function / Physical Skills ADL;Flexibility;Edema;IADL;Scar mobility;Sensation;ROM;Pain;Strength;UE functional use    Rehab Potential Good    Clinical Decision Making Limited treatment options, no task modification necessary    Comorbidities Affecting Occupational Performance: None    Modification or Assistance to Complete Evaluation  No modification of tasks or assist necessary to complete eval    OT Frequency 2x / week   2 x wk progress decrease to 1 x wk   OT Duration 8 weeks    OT Treatment/Interventions Self-care/ADL training;Cryotherapy;Moist Heat;Contrast Bath;Therapeutic exercise;Manual Therapy;Patient/family education;Scar mobilization;DME and/or AE instruction;Splinting    Consulted and Agree with Plan of Care Patient           Patient will  benefit from skilled therapeutic intervention in order to improve the following deficits and impairments:   Body Structure / Function / Physical Skills: ADL,Flexibility,Edema,IADL,Scar mobility,Sensation,ROM,Pain,Strength,UE functional use       Visit Diagnosis: Pain in right elbow - Plan: Ot plan of care cert/re-cert  Stiffness of right elbow, not elsewhere classified - Plan: Ot plan of care cert/re-cert  Stiffness of right wrist, not elsewhere classified - Plan: Ot plan of care cert/re-cert  Stiffness of right hand, not elsewhere classified - Plan: Ot plan of care cert/re-cert  Muscle weakness (generalized) - Plan: Ot plan of care cert/re-cert  Other disturbances of skin sensation - Plan: Ot plan of care cert/re-cert    Problem List Patient Active Problem List   Diagnosis Date Noted  . Cubital tunnel syndrome, right 07/16/2020  . Meralgia paraesthetica, left 07/16/2020  . Numbness and tingling 06/10/2020  . Encounter for long-term (current) use of high-risk medication 06/02/2020  . LFT elevation 06/02/2020  .  Osteoarthritis of spine with radiculopathy, cervical region 06/02/2020  . Primary osteoarthritis involving multiple joints 06/02/2020  . Rheumatoid arthritis of multiple sites with negative rheumatoid factor ([place]) 06/02/2020  . Acute posthemorrhagic anemia 03/27/2020  . Hip pain 03/27/2020  . Osteoarthritis 03/27/2020  . Sinus tachycardia 03/27/2020  . History of total hip arthroplasty 12/18/2019  . Proteinuria, unspecified 10/10/2019  . Microcalcification of left breast on mammogram 05/03/2017  . Herniated intervertebral disc of lumbar spine 09/02/2016  . Spondylolisthesis of lumbar region 09/02/2016  . Controlled type 2 diabetes mellitus with microalbuminuria, without long-term current use of insulin ([place]) 02/24/2015  . Vitamin D deficiency 02/24/2015  . Perennial allergic rhinitis 02/24/2015  . Hypercalcemia 02/24/2015  . GERD without esophagitis 02/24/2015  .  Hypertension, benign 02/24/2015  . Controlled gout 02/24/2015  . Fatty liver 02/24/2015  . Callus of foot 02/24/2015  . Primary osteoarthritis of right hip 02/24/2015  . History of carpal tunnel syndrome 02/24/2015  . Obesity (BMI 30-39.9) 02/24/2015  . Umbilical hernia without obstruction or gangrene 02/24/2015  . Intermittent low back pain 02/24/2015  . History of colonic polyps 02/24/2015  . Dyslipidemia 02/24/2015    Rosalyn Gess OTR/L,CLT 12/29/2020, 2:18 PM  [facility] [address] [place], [address] Phone: [phone]   Fax:  [phone]  Name: Maria Cruz MRN: [id number] Date of Birth: 08/21/1955

## 2020-12-29 NOTE — Patient Instructions (Signed)
Contrast - 2-3 x day  AROM - tendon glides 10 reps Oppositin to all digits- oval - 10 reps Ulnar N glide - 5 reps Pain free   Elbow pad made for daytime cushioning cubital tunnel and night time to decrease elbow flexion   Avoid propping up or sleeping in flexion or cradling hand - bend elbow

## 2021-01-01 ENCOUNTER — Ambulatory Visit: Payer: Managed Care, Other (non HMO) | Admitting: Occupational Therapy

## 2021-01-01 ENCOUNTER — Other Ambulatory Visit: Payer: Self-pay

## 2021-01-01 DIAGNOSIS — M25521 Pain in right elbow: Secondary | ICD-10-CM

## 2021-01-01 DIAGNOSIS — M25641 Stiffness of right hand, not elsewhere classified: Secondary | ICD-10-CM

## 2021-01-01 DIAGNOSIS — M25621 Stiffness of right elbow, not elsewhere classified: Secondary | ICD-10-CM

## 2021-01-01 DIAGNOSIS — M25631 Stiffness of right wrist, not elsewhere classified: Secondary | ICD-10-CM

## 2021-01-01 DIAGNOSIS — M6281 Muscle weakness (generalized): Secondary | ICD-10-CM

## 2021-01-01 DIAGNOSIS — R208 Other disturbances of skin sensation: Secondary | ICD-10-CM

## 2021-01-01 NOTE — Therapy (Signed)
[place] [facility] [address] [place], [address] Phone: [phone]   Fax:  [phone]  Occupational Therapy Treatment  Patient Details  Name: Maria Cruz MRN: [id number] Date of Birth: March 28, 1956 Referring Provider (OT): Dr Rudene Christians   Encounter Date: 01/01/2021   OT End of Session - 01/01/21 1507    Visit Number 2    Number of Visits 12    Date for OT Re-Evaluation 02/23/21    OT Start Time 4562    OT Stop Time 1520    OT Time Calculation (min) 35 min    Activity Tolerance Patient tolerated treatment well    Behavior During Therapy [facility] for tasks assessed/performed           Past Medical History:  Diagnosis Date  . Allergy   . Arthritis   . Back pain with radiation   . Calluse    Foot  . Colon polyp   . Diabetes mellitus without complication ([place]) 5638   Type II   . Fatty liver   . GERD (gastroesophageal reflux disease)   . History of total hip replacement, left 12/05/2019   [facility]  . Hyperlipidemia   . Hypertension   . Lumbar radiculopathy    Dr. Mack Guise  . Obesity   . Plantar fasciitis, left   . Vitamin D deficiency     Past Surgical History:  Procedure Laterality Date  . ABDOMINAL HYSTERECTOMY  2012  . ANTERIOR INTEROSSEOUS NERVE DECOMPRESSION Right 12/16/2020   Procedure: Right ulnar nerve release, elbow;  Surgeon: Hessie Knows, MD;  Location: [facility] ORS;  Service: Orthopedics;  Laterality: Right;  . APPENDECTOMY  2012  . BREAST BIOPSY Right 07/12/2012   Negative  . BREAST BIOPSY Left 05/09/2017   Affirm Bx of two areas-FIBROADENOMA WITH COARSE CALCIFICATION. NEGATIVE FOR ATYPIA,COLLAPSED CYST AND FIBROADENOMATOID CHANGE  . COLONOSCOPY  2009   Dr Allen Norris  . COLONOSCOPY WITH PROPOFOL N/A 07/27/2017   Procedure: COLONOSCOPY WITH PROPOFOL;  Surgeon: Robert Bellow, MD;  Location: [facility] ENDOSCOPY;  Service: Endoscopy;  Laterality: N/A;  .  DILATION AND CURETTAGE OF UTERUS  2007  . TUBAL LIGATION      There were no vitals filed for this visit.   Subjective Assessment - 01/01/21 1505    Subjective  Seen the Dr and was happy with progress- still little funny feeling in pinkie -but more strength -and no pain and stiffness feels better- using it more -50% use - but avoid picking up anything heavy or pushing up    Pertinent History Had cubital tunnel symptoms for about year- and had cubital tunnel release on May 11th by Dr Rudene Christians- - refer to OT/hand therapy on 5/13 - pt report symptoms better but had issues for about year and so stiffness and weakness in R hand    Patient Stated Goals Want to be able to use my R hand and arm to do things around the house, and see if I can maybe get part time job again    Currently in Pain? No/denies              [facility] OT Assessment - 01/01/21 0001      AROM   Right Wrist Flexion 75 Degrees      Right Hand AROM   R Index  MCP 0-90 80 Degrees    R Index PIP 0-100 90 Degrees    R Long  MCP 0-90 85 Degrees  R Long PIP 0-100 90 Degrees    R Ring  MCP 0-90 90 Degrees    R Ring PIP 0-100 85 Degrees    R Little  MCP 0-90 90 Degrees    R Little PIP 0-100 75 Degrees            Pt made great progress in MC flexion and composite flexion - less pain and tenderness - stitches removed and one sterri strip intact        OT Treatments/Exercises (OP) - 01/01/21 0001      RUE Contrast Bath   Time 8 minutes    Comments prior to soft tissue and AROM           Contrast - 2-3 x day to cont with on elbow and if needed on hand  AROM - tendon glides 10 reps- min A for intrinsic block and then thin pen in palm with composite to block MC at 90 Oppositin to all digits- oval - 10 reps Ulnar N glide - 5 reps- needed min A for maintaining wrist extention during Nerve glide Pain free   Elbow pad fabricated  for daytime cushioning cubital tunnel and night time to decrease elbow flexion - last time  made - pt to cont to use if needed   Avoid propping up or sleeping in flexion or cradling hand - bend elbow         OT Education - 01/01/21 1507    Education Details progress and HEP    Person(s) Educated Patient    Methods Explanation;Handout    Comprehension Verbal cues required;Returned demonstration;Verbalized understanding            OT Short Term Goals - 12/29/20 1351      OT SHORT TERM GOAL #1   Title Pt to be independent in HEP to decrease edema, pain and increase AROM to WNL in R UE to use in all ADL's    Baseline pain increase with ROM and Ulnar N glide to 6/10 - tenderness- edema increase 1 cm - AROM limited in elbow and wrist    Time 2    Period Weeks    Status New    Target Date 01/12/21             OT Long Term Goals - 12/29/20 1359      OT LONG TERM GOAL #1   Title R UE AROM and strength increase for pt to use normall in ADL:s and around the house without increase symptoms    Baseline flexion elbow 145, extention of elbow 0, but wrist decrease and composite Ulnar N glide - pain increase to 6/10 -    Time 4    Period Weeks    Status New    Target Date 01/26/21      OT LONG TERM GOAL #2   Title R grip strength increase to more than 75% compare to L hand to carry groceries without increase symptoms    Baseline Grip NT - but symptoms she had for year prior to surgery - ADD strength decrease in 5th    Time 6    Period Weeks    Status New    Target Date 02/09/21      OT LONG TERM GOAL #3   Title Pt R UE strength increase for pt to use it in ADL's and iADL's without increase symptoms    Baseline pain increase 6/10 - composite Ulnar N glide- decrease flexion and wrist ROM - tenderness  cubital tunnel - stitches remove tomorrow- ADD of 5th adn opposition decrease strength    Time 8    Period Weeks    Status New    Target Date 02/23/21                 Plan - 01/01/21 1508    Clinical Impression Statement Pt is 15 days s/p R cubital tunnel  release - pt show great progress this date in digits composite flexion. Stitches were remove and still one sterristrip in place- and tenderness and pain improved - now only some stiffness end range and some numbness in DIP of 5th. Opposition to 5th 5/5 - still decrease strength in ADD of 5th - and needed min A for end range Ulnar N glide and tendon glides    OT Occupational Profile and History Problem Focused Assessment - Including review of records relating to presenting problem    Occupational performance deficits (Please refer to evaluation for details): ADL's;IADL's;Rest and Sleep;Play;Leisure    Body Structure / Function / Physical Skills ADL;Flexibility;Edema;IADL;Scar mobility;Sensation;ROM;Pain;Strength;UE functional use    Rehab Potential Good    Clinical Decision Making Limited treatment options, no task modification necessary    Comorbidities Affecting Occupational Performance: None    Modification or Assistance to Complete Evaluation  No modification of tasks or assist necessary to complete eval    OT Frequency 2x / week    OT Duration 8 weeks    OT Treatment/Interventions Self-care/ADL training;Cryotherapy;Moist Heat;Contrast Bath;Therapeutic exercise;Manual Therapy;Patient/family education;Scar mobilization;DME and/or AE instruction;Splinting    Consulted and Agree with Plan of Care Patient           Patient will benefit from skilled therapeutic intervention in order to improve the following deficits and impairments:   Body Structure / Function / Physical Skills: ADL,Flexibility,Edema,IADL,Scar mobility,Sensation,ROM,Pain,Strength,UE functional use       Visit Diagnosis: Stiffness of right elbow, not elsewhere classified  Stiffness of right wrist, not elsewhere classified  Stiffness of right hand, not elsewhere classified  Muscle weakness (generalized)  Other disturbances of skin sensation  Pain in right elbow    Problem List Patient Active Problem List    Diagnosis Date Noted  . Cubital tunnel syndrome, right 07/16/2020  . Meralgia paraesthetica, left 07/16/2020  . Numbness and tingling 06/10/2020  . Encounter for long-term (current) use of high-risk medication 06/02/2020  . LFT elevation 06/02/2020  . Osteoarthritis of spine with radiculopathy, cervical region 06/02/2020  . Primary osteoarthritis involving multiple joints 06/02/2020  . Rheumatoid arthritis of multiple sites with negative rheumatoid factor ([place]) 06/02/2020  . Acute posthemorrhagic anemia 03/27/2020  . Hip pain 03/27/2020  . Osteoarthritis 03/27/2020  . Sinus tachycardia 03/27/2020  . History of total hip arthroplasty 12/18/2019  . Proteinuria, unspecified 10/10/2019  . Microcalcification of left breast on mammogram 05/03/2017  . Herniated intervertebral disc of lumbar spine 09/02/2016  . Spondylolisthesis of lumbar region 09/02/2016  . Controlled type 2 diabetes mellitus with microalbuminuria, without long-term current use of insulin ([place]) 02/24/2015  . Vitamin D deficiency 02/24/2015  . Perennial allergic rhinitis 02/24/2015  . Hypercalcemia 02/24/2015  . GERD without esophagitis 02/24/2015  . Hypertension, benign 02/24/2015  . Controlled gout 02/24/2015  . Fatty liver 02/24/2015  . Callus of foot 02/24/2015  . Primary osteoarthritis of right hip 02/24/2015  . History of carpal tunnel syndrome 02/24/2015  . Obesity (BMI 30-39.9) 02/24/2015  . Umbilical hernia without obstruction or gangrene 02/24/2015  . Intermittent low back pain 02/24/2015  . History of colonic  polyps 02/24/2015  . Dyslipidemia 02/24/2015    Rosalyn Gess OTR/l,CLT 01/01/2021, 3:27 PM  [place] [facility] [address], [place], [address] Phone: [phone]   Fax:  [phone]  Name: Maria Cruz MRN: [id number] Date of Birth: 1955-12-21

## 2021-01-04 ENCOUNTER — Other Ambulatory Visit: Payer: Self-pay | Admitting: Family Medicine

## 2021-01-04 DIAGNOSIS — J3089 Other allergic rhinitis: Secondary | ICD-10-CM

## 2021-01-04 NOTE — Telephone Encounter (Signed)
Requested Prescriptions  Pending Prescriptions Disp Refills  . fluticasone (FLONASE) 50 MCG/ACT nasal spray [Pharmacy Med Name: FLUTICASONE 50MCG NASAL SP (120) RX] 48 g 0    Sig: SHAKE LIQUID AND USE 2 SPRAYS IN EACH NOSTRIL DAILY     Ear, Nose, and Throat: Nasal Preparations - Corticosteroids Passed - 01/04/2021  3:17 AM      Passed - Valid encounter within last 12 months    Recent Outpatient Visits          3 months ago Diabetes mellitus type 2 in obese [facility])   [facility], Drue Stager, MD   6 months ago Diabetes mellitus type 2 in obese [facility])   [facility], Drue Stager, MD   9 months ago Diabetes mellitus type 2 in obese [facility])   [facility], Drue Stager, MD   1 year ago Dyslipidemia associated with type 2 diabetes mellitus [facility]   [facility] Steele Sizer, MD   1 year ago Chronic hip pain, bilateral   [facility] Steele Sizer, MD      Future Appointments            In 4 weeks Steele Sizer, MD [facility]

## 2021-01-06 ENCOUNTER — Ambulatory Visit: Payer: Managed Care, Other (non HMO) | Admitting: Occupational Therapy

## 2021-01-06 ENCOUNTER — Other Ambulatory Visit: Payer: Self-pay

## 2021-01-06 DIAGNOSIS — R208 Other disturbances of skin sensation: Secondary | ICD-10-CM

## 2021-01-06 DIAGNOSIS — M25521 Pain in right elbow: Secondary | ICD-10-CM

## 2021-01-06 DIAGNOSIS — M25621 Stiffness of right elbow, not elsewhere classified: Secondary | ICD-10-CM

## 2021-01-06 DIAGNOSIS — M25641 Stiffness of right hand, not elsewhere classified: Secondary | ICD-10-CM

## 2021-01-06 DIAGNOSIS — M25631 Stiffness of right wrist, not elsewhere classified: Secondary | ICD-10-CM

## 2021-01-06 DIAGNOSIS — M6281 Muscle weakness (generalized): Secondary | ICD-10-CM

## 2021-01-06 NOTE — Therapy (Signed)
[place] [facility] [address], [place], [address] Phone: [phone]   Fax:  [phone]  Occupational Therapy Treatment  Patient Details  Name: Maria Cruz MRN: [id number] Date of Birth: 21-Nov-1955 Referring Provider (OT): Dr Rudene Christians   Encounter Date: 01/06/2021   OT End of Session - 01/06/21 1520    Visit Number 3    Number of Visits 12    Date for OT Re-Evaluation 02/23/21    OT Start Time 3151    OT Stop Time 1520    OT Time Calculation (min) 36 min    Activity Tolerance Patient tolerated treatment well    Behavior During Therapy [facility] for tasks assessed/performed           Past Medical History:  Diagnosis Date  . Allergy   . Arthritis   . Back pain with radiation   . Calluse    Foot  . Colon polyp   . Diabetes mellitus without complication ([place]) 7616   Type II   . Fatty liver   . GERD (gastroesophageal reflux disease)   . History of total hip replacement, left 12/05/2019   [facility]  . Hyperlipidemia   . Hypertension   . Lumbar radiculopathy    Dr. Mack Guise  . Obesity   . Plantar fasciitis, left   . Vitamin D deficiency     Past Surgical History:  Procedure Laterality Date  . ABDOMINAL HYSTERECTOMY  2012  . ANTERIOR INTEROSSEOUS NERVE DECOMPRESSION Right 12/16/2020   Procedure: Right ulnar nerve release, elbow;  Surgeon: Hessie Knows, MD;  Location: [facility] ORS;  Service: Orthopedics;  Laterality: Right;  . APPENDECTOMY  2012  . BREAST BIOPSY Right 07/12/2012   Negative  . BREAST BIOPSY Left 05/09/2017   Affirm Bx of two areas-FIBROADENOMA WITH COARSE CALCIFICATION. NEGATIVE FOR ATYPIA,COLLAPSED CYST AND FIBROADENOMATOID CHANGE  . COLONOSCOPY  2009   Dr Allen Norris  . COLONOSCOPY WITH PROPOFOL N/A 07/27/2017   Procedure: COLONOSCOPY WITH PROPOFOL;  Surgeon: Robert Bellow, MD;  Location: [facility] END[facility]COPY;  Service: Endoscopy;  Laterality: N/A;  .  DILATION AND CURETTAGE OF UTERUS  2007  . TUBAL LIGATION      There were no vitals filed for this visit.   Subjective Assessment - 01/06/21 1518    Subjective  Doing good- numbness or pins and needles only now in my pinkie - feel better and more motion - less stiffness- scar feels little tight    Pertinent History Had cubital tunnel symptoms for about year- and had cubital tunnel release on May 11th by Dr Rudene Christians- - refer to OT/hand therapy on 5/13 - pt report symptoms better but had issues for about year and so stiffness and weakness in R hand                  Cont to have numbness in 5th digit- prior to surgery in ulnar side of hand and forearm - pt ed on nerve healing  Pt decrease to 1 x wk - to cont with HEP closer to 5-6 wks s/p to initiate and assess strength      OT Treatments/Exercises (OP) - 01/06/21 0001      Modalities   Modalities Moist Heat             Pt ed on scar massage and cica scar pad for night time use proviided and ed on using underr her elbow pad for night time  AROM -  tendon glides 10 reps- min A for intrinsic block and then thin pen in palm with composite to block MC at 90 - needed reminder again this date   Oppositin to all digits- oval - 10 reps Ulnar N glide - 5 reps- needed min A for maintaining wrist extention during Nerve glide Pain free   Elbow padfabricated at evalfor daytime cushioning cubital tunnel and night time to decrease elbow flexion - pt to cont to use if needed   Avoid propping up or sleeping in flexion or cradling hand - bend elbow       OT Education - 01/06/21 1520    Education Details progress and HEP    Person(s) Educated Patient    Methods Explanation;Handout    Comprehension Verbal cues required;Returned demonstration;Verbalized understanding            OT Short Term Goals - 12/29/20 1351      OT SHORT TERM GOAL #1   Title Pt to be independent in HEP to decrease edema, pain and increase AROM to WNL in  R UE to use in all ADL's    Baseline pain increase with ROM and Ulnar N glide to 6/10 - tenderness- edema increase 1 cm - AROM limited in elbow and wrist    Time 2    Period Weeks    Status New    Target Date 01/12/21             OT Long Term Goals - 12/29/20 1359      OT LONG TERM GOAL #1   Title R UE AROM and strength increase for pt to use normall in ADL:s and around the house without increase symptoms    Baseline flexion elbow 145, extention of elbow 0, but wrist decrease and composite Ulnar N glide - pain increase to 6/10 -    Time 4    Period Weeks    Status New    Target Date 01/26/21      OT LONG TERM GOAL #2   Title R grip strength increase to more than 75% compare to L hand to carry groceries without increase symptoms    Baseline Grip NT - but symptoms she had for year prior to surgery - ADD strength decrease in 5th    Time 6    Period Weeks    Status New    Target Date 02/09/21      OT LONG TERM GOAL #3   Title Pt R UE strength increase for pt to use it in ADL's and iADL's without increase symptoms    Baseline pain increase 6/10 - composite Ulnar N glide- decrease flexion and wrist ROM - tenderness cubital tunnel - stitches remove tomorrow- ADD of 5th adn opposition decrease strength    Time 8    Period Weeks    Status New    Target Date 02/23/21                 Plan - 01/06/21 1520    Clinical Impression Statement Pt is 20 days s/p R cubital tunnel release - pt show great progress this date in digits composite flexion Pt ed on scar massage -and end range sttiffness improve greatly - numbness in 5th digit only. Opposition to 5th 5/5 - still decrease strength in ADD of 5th - and needed SBA for end range Ulnar N glide and tendon glides review again    OT Occupational Profile and History Problem Focused Assessment - Including review of records relating  to presenting problem    Occupational performance deficits (Please refer to evaluation for details):  ADL's;IADL's;Rest and Sleep;Play;Leisure    Body Structure / Function / Physical Skills ADL;Flexibility;Edema;IADL;Scar mobility;Sensation;ROM;Pain;Strength;UE functional use    Rehab Potential Good    Clinical Decision Making Limited treatment options, no task modification necessary    Comorbidities Affecting Occupational Performance: None    Modification or Assistance to Complete Evaluation  No modification of tasks or assist necessary to complete eval    OT Frequency 1x / week    OT Duration 8 weeks    OT Treatment/Interventions Self-care/ADL training;Cryotherapy;Moist Heat;Contrast Bath;Therapeutic exercise;Manual Therapy;Patient/family education;Scar mobilization;DME and/or AE instruction;Splinting    Consulted and Agree with Plan of Care Patient           Patient will benefit from skilled therapeutic intervention in order to improve the following deficits and impairments:   Body Structure / Function / Physical Skills: ADL,Flexibility,Edema,IADL,Scar mobility,Sensation,ROM,Pain,Strength,UE functional use       Visit Diagnosis: Stiffness of right elbow, not elsewhere classified  Stiffness of right wrist, not elsewhere classified  Stiffness of right hand, not elsewhere classified  Muscle weakness (generalized)  Other disturbances of skin sensation  Pain in right elbow    Problem List Patient Active Problem List   Diagnosis Date Noted  . Cubital tunnel syndrome, right 07/16/2020  . Meralgia paraesthetica, left 07/16/2020  . Numbness and tingling 06/10/2020  . Encounter for long-term (current) use of high-risk medication 06/02/2020  . LFT elevation 06/02/2020  . Osteoarthritis of spine with radiculopathy, cervical region 06/02/2020  . Primary osteoarthritis involving multiple joints 06/02/2020  . Rheumatoid arthritis of multiple sites with negative rheumatoid factor ([place]) 06/02/2020  . Acute posthemorrhagic anemia 03/27/2020  . Hip pain 03/27/2020  . Osteoarthritis  03/27/2020  . Sinus tachycardia 03/27/2020  . History of total hip arthroplasty 12/18/2019  . Proteinuria, unspecified 10/10/2019  . Microcalcification of left breast on mammogram 05/03/2017  . Herniated intervertebral disc of lumbar spine 09/02/2016  . Spondylolisthesis of lumbar region 09/02/2016  . Controlled type 2 diabetes mellitus with microalbuminuria, without long-term current use of insulin ([place]) 02/24/2015  . Vitamin D deficiency 02/24/2015  . Perennial allergic rhinitis 02/24/2015  . Hypercalcemia 02/24/2015  . GERD without esophagitis 02/24/2015  . Hypertension, benign 02/24/2015  . Controlled gout 02/24/2015  . Fatty liver 02/24/2015  . Callus of foot 02/24/2015  . Primary osteoarthritis of right hip 02/24/2015  . History of carpal tunnel syndrome 02/24/2015  . Obesity (BMI 30-39.9) 02/24/2015  . Umbilical hernia without obstruction or gangrene 02/24/2015  . Intermittent low back pain 02/24/2015  . History of colonic polyps 02/24/2015  . Dyslipidemia 02/24/2015    Rosalyn Gess OTR/l,CLT 01/06/2021, 3:24 PM  [place] [facility] [address], [place], [address] Phone: [phone]   Fax:  [phone]  Name: NATALEAH SCIONEAUX MRN: [id number] Date of Birth: [birth year]

## 2021-01-08 ENCOUNTER — Ambulatory Visit: Payer: Managed Care, Other (non HMO) | Admitting: Occupational Therapy

## 2021-01-15 ENCOUNTER — Other Ambulatory Visit: Payer: Self-pay

## 2021-01-15 ENCOUNTER — Ambulatory Visit: Payer: Managed Care, Other (non HMO) | Attending: Orthopedic Surgery | Admitting: Occupational Therapy

## 2021-01-15 DIAGNOSIS — M25631 Stiffness of right wrist, not elsewhere classified: Secondary | ICD-10-CM | POA: Diagnosis present

## 2021-01-15 DIAGNOSIS — M6281 Muscle weakness (generalized): Secondary | ICD-10-CM | POA: Insufficient documentation

## 2021-01-15 DIAGNOSIS — R208 Other disturbances of skin sensation: Secondary | ICD-10-CM | POA: Diagnosis present

## 2021-01-15 DIAGNOSIS — M25641 Stiffness of right hand, not elsewhere classified: Secondary | ICD-10-CM

## 2021-01-15 DIAGNOSIS — M25621 Stiffness of right elbow, not elsewhere classified: Secondary | ICD-10-CM

## 2021-01-15 DIAGNOSIS — M25521 Pain in right elbow: Secondary | ICD-10-CM | POA: Insufficient documentation

## 2021-01-15 NOTE — Therapy (Signed)
[place] [facility] [address], [place], [address] Phone: [phone]   Fax:  [phone]  Occupational Therapy Treatment  Patient Details  Name: Maria Cruz MRN: [id number] Date of Birth: 1955/12/11 Referring Provider (OT): Dr Rudene Christians   Encounter Date: 01/15/2021   OT End of Session - 01/15/21 1508     Visit Number 4    Number of Visits 12    Date for OT Re-Evaluation 02/23/21    OT Start Time 5361    OT Stop Time 1508    OT Time Calculation (min) 24 min    Activity Tolerance Patient tolerated treatment well    Behavior During Therapy [facility] for tasks assessed/performed             Past Medical History:  Diagnosis Date   Allergy    Arthritis    Back pain with radiation    Calluse    Foot   Colon polyp    Diabetes mellitus without complication ([place]) 4431   Type II    Fatty liver    GERD (gastroesophageal reflux disease)    History of total hip replacement, left 12/05/2019   [place] [facility]   Hyperlipidemia    Hypertension    Lumbar radiculopathy    Dr. Mack Guise   Obesity    Plantar fasciitis, left    Vitamin D deficiency     Past Surgical History:  Procedure Laterality Date   ABDOMINAL HYSTERECTOMY  2012   ANTERIOR INTEROSSEOUS NERVE DECOMPRESSION Right 12/16/2020   Procedure: Right ulnar nerve release, elbow;  Surgeon: Hessie Knows, MD;  Location: [facility] ORS;  Service: Orthopedics;  Laterality: Right;   APPENDECTOMY  2012   BREAST BIOPSY Right 07/12/2012   Negative   BREAST BIOPSY Left 05/09/2017   Affirm Bx of two areas-FIBROADENOMA WITH COARSE CALCIFICATION. NEGATIVE FOR ATYPIA,COLLAPSED CYST AND FIBROADENOMATOID CHANGE   COLONOSCOPY  2009   Dr Allen Norris   COLONOSCOPY WITH PROPOFOL N/A 07/27/2017   Procedure: COLONOSCOPY WITH PROPOFOL;  Surgeon: Robert Bellow, MD;  Location: [facility] END[facility]COPY;  Service: Endoscopy;  Laterality: N/A;   DILATION AND CURETTAGE OF  UTERUS  2007   TUBAL LIGATION      There were no vitals filed for this visit.   Subjective Assessment - 01/15/21 1508     Subjective  Doing okay - just still that funny feeling in the pinkie - but mostly at night time - using it more just not picking up anything heavy    Pertinent History Had cubital tunnel symptoms for about year- and had cubital tunnel release on May 11th by Dr Rudene Christians- - refer to OT/hand therapy on 5/13 - pt report symptoms better but had issues for about year and so stiffness and weakness in R hand    Patient Stated Goals Want to be able to use my R hand and arm to do things around the house, and see if I can maybe get part time job again    Currently in Pain? No/denies                [facility] OT Assessment - 01/15/21 0001       Strength   Right Hand Grip (lbs) 48    Right Hand Lateral Pinch 18 lbs    Right Hand 3 Point Pinch 14 lbs    Left Hand Grip (lbs) 56    Left Hand Lateral Pinch 17 lbs    Left Hand 3  Point Pinch 13 lbs            Strength in elbow and wrist 5/5 Can push up from chair - no pain  Scar tissue improve - no need for cica scar pad  But to cont with scar massage   Pt to cont with tendon glides  Opposition to all digits- oval - 10 reps Ulnar N glide - 5 reps- SBA  maintaining wrist extention during Nerve glide Pain free Add and review med teal putty for grip, lat and 3 point - no pain  20 reps  Can increase to 2nd set and 3rd set over the next 2 wks  Painfree  2 x day   Pt can stop wearing elbow pad   Avoid propping up or sleeping in flexion or cradling hand - bend elbow                OT Education - 01/15/21 1508     Education Details progress and HEP    Person(s) Educated Patient    Methods Explanation;Handout    Comprehension Verbal cues required;Returned demonstration;Verbalized understanding              OT Short Term Goals - 12/29/20 1351       OT SHORT TERM GOAL #1   Title Pt to be independent in  HEP to decrease edema, pain and increase AROM to WNL in R UE to use in all ADL's    Baseline pain increase with ROM and Ulnar N glide to 6/10 - tenderness- edema increase 1 cm - AROM limited in elbow and wrist    Time 2    Period Weeks    Status New    Target Date 01/12/21               OT Long Term Goals - 12/29/20 1359       OT LONG TERM GOAL #1   Title R UE AROM and strength increase for pt to use normall in ADL:s and around the house without increase symptoms    Baseline flexion elbow 145, extention of elbow 0, but wrist decrease and composite Ulnar N glide - pain increase to 6/10 -    Time 4    Period Weeks    Status New    Target Date 01/26/21      OT LONG TERM GOAL #2   Title R grip strength increase to more than 75% compare to L hand to carry groceries without increase symptoms    Baseline Grip NT - but symptoms she had for year prior to surgery - ADD strength decrease in 5th    Time 6    Period Weeks    Status New    Target Date 02/09/21      OT LONG TERM GOAL #3   Title Pt R UE strength increase for pt to use it in ADL's and iADL's without increase symptoms    Baseline pain increase 6/10 - composite Ulnar N glide- decrease flexion and wrist ROM - tenderness cubital tunnel - stitches remove tomorrow- ADD of 5th adn opposition decrease strength    Time 8    Period Weeks    Status New    Target Date 02/23/21                   Plan - 01/15/21 1509     Clinical Impression Statement Pt is 4 wks s/p R cubital tunnel release - pt show great progress in digits composite  flexion, elbow AROM- increase strength in wrist and elbow - assess grip and prehension this date - Prehension about same as L - but grip decrease. Provided some med putty for gripping, lat and 3 point for pt to do next 10 days and follow up.  Pt to cont scar massage and Ulnar N glide - numbness in 5th digit only but not constant. Opposition to 5th 5/5 - still decrease strength in ADD of 5th. Pt  can benefit from cont OT services.    OT Occupational Profile and History Problem Focused Assessment - Including review of records relating to presenting problem    Occupational performance deficits (Please refer to evaluation for details): ADL's;IADL's;Rest and Sleep;Play;Leisure    Body Structure / Function / Physical Skills ADL;Flexibility;Edema;IADL;Scar mobility;Sensation;ROM;Pain;Strength;UE functional use    Rehab Potential Good    Clinical Decision Making Limited treatment options, no task modification necessary    Comorbidities Affecting Occupational Performance: None    Modification or Assistance to Complete Evaluation  No modification of tasks or assist necessary to complete eval    OT Frequency Biweekly    OT Duration 6 weeks    OT Treatment/Interventions Self-care/ADL training;Cryotherapy;Moist Heat;Contrast Bath;Therapeutic exercise;Manual Therapy;Patient/family education;Scar mobilization;DME and/or AE instruction;Splinting    Consulted and Agree with Plan of Care Patient             Patient will benefit from skilled therapeutic intervention in order to improve the following deficits and impairments:   Body Structure / Function / Physical Skills: ADL, Flexibility, Edema, IADL, Scar mobility, Sensation, ROM, Pain, Strength, UE functional use       Visit Diagnosis: Stiffness of right elbow, not elsewhere classified  Stiffness of right wrist, not elsewhere classified  Stiffness of right hand, not elsewhere classified  Muscle weakness (generalized)  Other disturbances of skin sensation  Pain in right elbow    Problem List Patient Active Problem List   Diagnosis Date Noted   Cubital tunnel syndrome, right 07/16/2020   Meralgia paraesthetica, left 07/16/2020   Numbness and tingling 06/10/2020   Encounter for long-term (current) use of high-risk medication 06/02/2020   LFT elevation 06/02/2020   Osteoarthritis of spine with radiculopathy, cervical region  06/02/2020   Primary osteoarthritis involving multiple joints 06/02/2020   Rheumatoid arthritis of multiple sites with negative rheumatoid factor ([place]) 06/02/2020   Acute posthemorrhagic anemia 03/27/2020   Hip pain 03/27/2020   Osteoarthritis 03/27/2020   Sinus tachycardia 03/27/2020   History of total hip arthroplasty 12/18/2019   Proteinuria, unspecified 10/10/2019   Microcalcification of left breast on mammogram 05/03/2017   Herniated intervertebral disc of lumbar spine 09/02/2016   Spondylolisthesis of lumbar region 09/02/2016   Controlled type 2 diabetes mellitus with microalbuminuria, without long-term current use of insulin (HCC) 02/24/2015   Vitamin D deficiency 02/24/2015   Perennial allergic rhinitis 02/24/2015   Hypercalcemia 02/24/2015   GERD without esophagitis 02/24/2015   Hypertension, benign 02/24/2015   Controlled gout 02/24/2015   Fatty liver 02/24/2015   Callus of foot 02/24/2015   Primary osteoarthritis of right hip 02/24/2015   History of carpal tunnel syndrome 02/24/2015   Obesity (BMI 30-39.9) 77/41/2878   Umbilical hernia without obstruction or gangrene 02/24/2015   Intermittent low back pain 02/24/2015   History of colonic polyps 02/24/2015   Dyslipidemia 02/24/2015    Rosalyn Gess OTR/L,CLT 01/15/2021, 3:13 PM  [place] [facility] [address], [place], [address] Phone: [phone]   Fax:  [phone]  Name: Maria Cruz MRN: [id number] Date of Birth: October 01, 1955

## 2021-01-24 ENCOUNTER — Other Ambulatory Visit: Payer: Self-pay | Admitting: Family Medicine

## 2021-01-24 DIAGNOSIS — E1169 Type 2 diabetes mellitus with other specified complication: Secondary | ICD-10-CM

## 2021-01-24 NOTE — Telephone Encounter (Signed)
Requested Prescriptions  Pending Prescriptions Disp Refills  . omega-3 acid ethyl esters (LOVAZA) 1 g capsule [Pharmacy Med Name: OMEGA-3-ACID 1GM CAPSULES (RX)] 360 capsule 2    Sig: TAKE 2 CAPSULES BY MOUTH TWICE DAILY     Endocrinology:  Nutritional Agents Passed - 01/24/2021 11:25 AM      Passed - Valid encounter within last 12 months    Recent Outpatient Visits          4 months ago Diabetes mellitus type 2 in obese [facility])   [facility], Drue Stager, MD   6 months ago Diabetes mellitus type 2 in obese [facility])   [facility], Drue Stager, MD   10 months ago Diabetes mellitus type 2 in obese [facility])   [facility] Steele Sizer, MD   1 year ago Dyslipidemia associated with type 2 diabetes mellitus [facility])   [facility] Steele Sizer, MD   1 year ago Chronic hip pain, bilateral   [facility] Steele Sizer, MD      Future Appointments            In 1 week Steele Sizer, MD [facility], [facility]

## 2021-01-27 ENCOUNTER — Other Ambulatory Visit: Payer: Self-pay

## 2021-01-27 ENCOUNTER — Ambulatory Visit: Payer: Managed Care, Other (non HMO) | Admitting: Occupational Therapy

## 2021-01-27 DIAGNOSIS — M25621 Stiffness of right elbow, not elsewhere classified: Secondary | ICD-10-CM

## 2021-01-27 DIAGNOSIS — M6281 Muscle weakness (generalized): Secondary | ICD-10-CM

## 2021-01-27 DIAGNOSIS — M25631 Stiffness of right wrist, not elsewhere classified: Secondary | ICD-10-CM

## 2021-01-27 DIAGNOSIS — M25641 Stiffness of right hand, not elsewhere classified: Secondary | ICD-10-CM

## 2021-01-27 DIAGNOSIS — R208 Other disturbances of skin sensation: Secondary | ICD-10-CM

## 2021-01-27 NOTE — Therapy (Signed)
[place] [facility] [address] [place], [place], [address] Phone: [phone]   Fax:  [phone]  Occupational Therapy Treatment  Patient Details  Name: Maria Cruz MRN: [id number] Date of Birth: 1956/03/12 Referring Provider (OT): Dr Rudene Christians   Encounter Date: 01/27/2021   OT End of Session - 01/27/21 1014     Visit Number 5    Number of Visits 12    Date for OT Re-Evaluation 02/23/21    OT Start Time 0947    OT Stop Time 1013    OT Time Calculation (min) 26 min    Activity Tolerance Patient tolerated treatment well    Behavior During Therapy [facility] for tasks assessed/performed             Past Medical History:  Diagnosis Date   Allergy    Arthritis    Back pain with radiation    Calluse    Foot   Colon polyp    Diabetes mellitus without complication ([place]) 0947   Type II    Fatty liver    GERD (gastroesophageal reflux disease)    History of total hip replacement, left 12/05/2019   [place] [facility]   Hyperlipidemia    Hypertension    Lumbar radiculopathy    Dr. Mack Guise   Obesity    Plantar fasciitis, left    Vitamin D deficiency     Past Surgical History:  Procedure Laterality Date   ABDOMINAL HYSTERECTOMY  2012   ANTERIOR INTEROSSEOUS NERVE DECOMPRESSION Right 12/16/2020   Procedure: Right ulnar nerve release, elbow;  Surgeon: Hessie Knows, MD;  Location: [facility] ORS;  Service: Orthopedics;  Laterality: Right;   APPENDECTOMY  2012   BREAST BIOPSY Right 07/12/2012   Negative   BREAST BIOPSY Left 05/09/2017   Affirm Bx of two areas-FIBROADENOMA WITH COARSE CALCIFICATION. NEGATIVE FOR ATYPIA,COLLAPSED CYST AND FIBROADENOMATOID CHANGE   COLONOSCOPY  2009   Dr Allen Norris   COLONOSCOPY WITH PROPOFOL N/A 07/27/2017   Procedure: COLONOSCOPY WITH PROPOFOL;  Surgeon: Robert Bellow, MD;  Location: [facility] ENDOSCOPY;  Service: Endoscopy;  Laterality: N/A;   DILATION AND CURETTAGE  OF UTERUS  2007   TUBAL LIGATION      There were no vitals filed for this visit.   Subjective Assessment - 01/27/21 1013     Subjective  Using my hand about 75% - still funny feeling in pinkie but no only the tip and some nights my pinkie and ring finger still get swollen but the elbow sleeve helps you made me -Can I get new scar pad    Pertinent History Had cubital tunnel symptoms for about year- and had cubital tunnel release on May 11th by Dr Rudene Christians- - refer to OT/hand therapy on 5/13 - pt report symptoms better but had issues for about year and so stiffness and weakness in R hand    Patient Stated Goals Want to be able to use my R hand and arm to do things around the house, and see if I can maybe get part time job again    Currently in Pain? No/denies                [facility] OT Assessment - 01/27/21 0001       Strength   Right Hand Grip (lbs) 51    Right Hand Lateral Pinch 17 lbs    Right Hand 3 Point Pinch 14 lbs    Left Hand Grip (lbs) 56  Left Hand Lateral Pinch 17 lbs    Left Hand 3 Point Pinch 13 lbs             Sensation improving - numbness only now in DIP of 5th  Strength in elbow and wrist 5/5 Can push up from chair - no pain And report using it about 75%  Scar tissue improve - ask for new cica scar pad Cont scar massage -and night time elbow sleeve -appear helps for swelling that she feels at night  time in 5th and 4th      Pt to cont with tendon glides Opposition to all digits- oval - 10 reps Ulnar N glide - 5 reps maintaining wrist extention during Nerve glide Pain free Upgrade to green firm putty for grip, lat and 3 point - no pain 20 reps Can increase to 2nd set and 3rd set over the next 2 -3wks Painfree 2 x day     Avoid propping up or sleeping in flexion or cradling hand - bend elbow                   OT Education - 01/27/21 1014     Education Details progress and HEP    Person(s) Educated Patient    Methods  Explanation;Handout    Comprehension Verbal cues required;Returned demonstration;Verbalized understanding              OT Short Term Goals - 12/29/20 1351       OT SHORT TERM GOAL #1   Title Pt to be independent in HEP to decrease edema, pain and increase AROM to WNL in R UE to use in all ADL's    Baseline pain increase with ROM and Ulnar N glide to 6/10 - tenderness- edema increase 1 cm - AROM limited in elbow and wrist    Time 2    Period Weeks    Status New    Target Date 01/12/21               OT Long Term Goals - 12/29/20 1359       OT LONG TERM GOAL #1   Title R UE AROM and strength increase for pt to use normall in ADL:s and around the house without increase symptoms    Baseline flexion elbow 145, extention of elbow 0, but wrist decrease and composite Ulnar N glide - pain increase to 6/10 -    Time 4    Period Weeks    Status New    Target Date 01/26/21      OT LONG TERM GOAL #2   Title R grip strength increase to more than 75% compare to L hand to carry groceries without increase symptoms    Baseline Grip NT - but symptoms she had for year prior to surgery - ADD strength decrease in 5th    Time 6    Period Weeks    Status New    Target Date 02/09/21      OT LONG TERM GOAL #3   Title Pt R UE strength increase for pt to use it in ADL's and iADL's without increase symptoms    Baseline pain increase 6/10 - composite Ulnar N glide- decrease flexion and wrist ROM - tenderness cubital tunnel - stitches remove tomorrow- ADD of 5th adn opposition decrease strength    Time 8    Period Weeks    Status New    Target Date 02/23/21  Plan - 01/27/21 1014     Clinical Impression Statement Pt is about 6 wks s/p R cubital tunnel release - pt show great progress in digits composite flexion, elbow AROM- increase strength in wrist and elbow - grip increased -and upgrade this date her putty resistance to firm green for gripping, lat and 3 point  for pt to do next 3 wks.  Pt to cont scar massage and Ulnar N glide - numbness in 5th digit improve - now only in DIP - Opposition to 5th 5/5 - still decrease strength in ADD of 5th. Pt can benefit from cont OT services.    OT Occupational Profile and History Problem Focused Assessment - Including review of records relating to presenting problem    Occupational performance deficits (Please refer to evaluation for details): ADL's;IADL's;Rest and Sleep;Play;Leisure    Body Structure / Function / Physical Skills ADL;Flexibility;Edema;IADL;Scar mobility;Sensation;ROM;Pain;Strength;UE functional use    Rehab Potential Good    Clinical Decision Making Limited treatment options, no task modification necessary    Comorbidities Affecting Occupational Performance: None    Modification or Assistance to Complete Evaluation  No modification of tasks or assist necessary to complete eval    OT Frequency --   3 wks   OT Duration 4 weeks    OT Treatment/Interventions Self-care/ADL training;Cryotherapy;Moist Heat;Contrast Bath;Therapeutic exercise;Manual Therapy;Patient/family education;Scar mobilization;DME and/or AE instruction;Splinting    Consulted and Agree with Plan of Care Patient             Patient will benefit from skilled therapeutic intervention in order to improve the following deficits and impairments:   Body Structure / Function / Physical Skills: ADL, Flexibility, Edema, IADL, Scar mobility, Sensation, ROM, Pain, Strength, UE functional use       Visit Diagnosis: Stiffness of right elbow, not elsewhere classified  Stiffness of right wrist, not elsewhere classified  Stiffness of right hand, not elsewhere classified  Muscle weakness (generalized)  Other disturbances of skin sensation    Problem List Patient Active Problem List   Diagnosis Date Noted   Cubital tunnel syndrome, right 07/16/2020   Meralgia paraesthetica, left 07/16/2020   Numbness and tingling 06/10/2020    Encounter for long-term (current) use of high-risk medication 06/02/2020   LFT elevation 06/02/2020   Osteoarthritis of spine with radiculopathy, cervical region 06/02/2020   Primary osteoarthritis involving multiple joints 06/02/2020   Rheumatoid arthritis of multiple sites with negative rheumatoid factor ([place]) 06/02/2020   Acute posthemorrhagic anemia 03/27/2020   Hip pain 03/27/2020   Osteoarthritis 03/27/2020   Sinus tachycardia 03/27/2020   History of total hip arthroplasty 12/18/2019   Proteinuria, unspecified 10/10/2019   Microcalcification of left breast on mammogram 05/03/2017   Herniated intervertebral disc of lumbar spine 09/02/2016   Spondylolisthesis of lumbar region 09/02/2016   Controlled type 2 diabetes mellitus with microalbuminuria, without long-term current use of insulin ([facility]CC) 02/24/2015   Vitamin D deficiency 02/24/2015   Perennial allergic rhinitis 02/24/2015   Hypercalcemia 02/24/2015   GERD without esophagitis 02/24/2015   Hypertension, benign 02/24/2015   Controlled gout 02/24/2015   Fatty liver 02/24/2015   Callus of foot 02/24/2015   Primary osteoarthritis of right hip 02/24/2015   History of carpal tunnel syndrome 02/24/2015   Obesity (BMI 30-39.9) 15/40/0867   Umbilical hernia without obstruction or gangrene 02/24/2015   Intermittent low back pain 02/24/2015   History of colonic polyps 02/24/2015   Dyslipidemia 02/24/2015    Maria Cruz 01/27/2021, 10:17 AM  [place] [facility] [address] [place], [address] Phone: [phone]   Fax:  [phone]  Name: Maria Cruz MRN: [id number] Date of Birth: Dec 23, 1955

## 2021-01-30 NOTE — Progress Notes (Signed)
Name: Maria Cruz   MRN: [id number]    DOB: September 13, 1955   Date:02/02/2021       Progress Note  Subjective  Chief Complaint  Follow Up  HPI  DM II with proteinuria: urine micro has been elevated since 2019, under the care of Dr. Abigail Butts since 2021. She is on  ARB/CCB, Synjardi , Ozempic  A1C has been controlled with current regiment.  She has dyslipidemia and takes Rosuvastatin  and Lovaza, she also has microalbuminuria and is on ARB. She denies polyphagia, polydipsia and polyuria. A1C today was 6.5 %   Hyperlipidemia: Taking Lovaza and Rosuvastatin and LDL is now at goal at 64, continue medications. She takes CoQ10 , no myopathy    HTN: She is on AZOR for now ( Norvasc plus Benicar ) Denies chest pain, palpitation or dizziness. She has DM also and proteinuria, under the care of nephrologist. BP is at goal , [facility]    Gout: no recent episodes, taking Allopurinol daily, denies side effects. She has been compliant with medication    Right 5 th finger pain: she was seen by  neurologist and ortho, had right ulnar nerve release and is feeling better, still taking Lyrica and needs refills, having PT and is improving. She also retied    History of  left hip replacement on 12/05/2019 by Dr. Harlow Mares, she is back on PT, she states it has been helping with pain on left outer thigh  RA: taking plaquenil, retired, taking medication as recommended   Patient Active Problem List   Diagnosis Date Noted   Cubital tunnel syndrome, right 07/16/2020   Meralgia paraesthetica, left 07/16/2020   Ulnar neuropathy at elbow, right 07/16/2020   Numbness and tingling 06/10/2020   Encounter for long-term (current) use of high-risk medication 06/02/2020   LFT elevation 06/02/2020   Osteoarthritis of spine with radiculopathy, cervical region 06/02/2020   Primary osteoarthritis involving multiple joints 06/02/2020   Rheumatoid arthritis of multiple sites with negative  rheumatoid factor ([place]) 06/02/2020   Acute posthemorrhagic anemia 03/27/2020   Hip pain 03/27/2020   Osteoarthritis 03/27/2020   Sinus tachycardia 03/27/2020   History of total hip arthroplasty 12/18/2019   Proteinuria, unspecified 10/10/2019   Microcalcification of left breast on mammogram 05/03/2017   Herniated intervertebral disc of lumbar spine 09/02/2016   Spondylolisthesis of lumbar region 09/02/2016   Controlled type 2 diabetes mellitus with microalbuminuria, without long-term current use of insulin ([place]) 02/24/2015   Vitamin D deficiency 02/24/2015   Perennial allergic rhinitis 02/24/2015   Hypercalcemia 02/24/2015   GERD without esophagitis 02/24/2015   Hypertension, benign 02/24/2015   Controlled gout 02/24/2015   Fatty liver 02/24/2015   Callus of foot 02/24/2015   Primary osteoarthritis of right hip 02/24/2015   History of carpal tunnel syndrome 02/24/2015   Obesity (BMI 30-39.9) 71/01/2693   Umbilical hernia without obstruction or gangrene 02/24/2015   Intermittent low back pain 02/24/2015   History of colonic polyps 02/24/2015   Dyslipidemia 02/24/2015    Past Surgical History:  Procedure Laterality Date   ABDOMINAL HYSTERECTOMY  2012   ANTERIOR INTEROSSEOUS NERVE DECOMPRESSION Right 12/16/2020   Procedure: Right ulnar nerve release, elbow;  Surgeon: Hessie Knows, MD;  Location: [facility] ORS;  Service: Orthopedics;  Laterality: Right;   APPENDECTOMY  2012   BREAST BIOPSY Right 07/12/2012   Negative   BREAST BIOPSY Left 05/09/2017   Affirm Bx of two areas-FIBROADENOMA WITH COARSE CALCIFICATION. NEGATIVE FOR ATYPIA,COLLAPSED CYST  AND FIBROADENOMATOID CHANGE   COLONOSCOPY  2009   Dr Allen Norris   COLONOSCOPY WITH PROPOFOL N/A 07/27/2017   Procedure: COLONOSCOPY WITH PROPOFOL;  Surgeon: Robert Bellow, MD;  Location: [facility] ENDOSCOPY;  Service: Endoscopy;  Laterality: N/A;   DILATION AND CURETTAGE OF UTERUS  2007   TUBAL LIGATION      Family History  Problem Relation  Age of Onset   Multiple sclerosis Mother    Heart attack Father    Kidney failure Sister    Heart attack Brother    Diabetes Daughter    CAD Sister    Arthritis Sister        RA   Lupus Sister    Breast cancer Neg Hx    Colon cancer Neg Hx     Social History   Tobacco Use   Smoking status: Never   Smokeless tobacco: Never  Substance Use Topics   Alcohol use: No    Alcohol/week: 0.0 standard drinks     Current Outpatient Medications:    acetaminophen (TYLENOL) 500 MG tablet, Take 1 tablet (500 mg total) by mouth every 6 (six) hours as needed. (Patient taking differently: Take 500 mg by mouth 2 (two) times daily as needed for moderate pain.), Disp: 90 tablet, Rfl: 0   allopurinol (ZYLOPRIM) 300 MG tablet, TAKE 1 TABLET(300 MG) BY MOUTH TWICE DAILY (Patient taking differently: Take 300 mg by mouth 2 (two) times daily.), Disp: 180 tablet, Rfl: 3   amLODipine-olmesartan (AZOR) 5-40 MG tablet, Take 1 tablet by mouth daily., Disp: 90 tablet, Rfl: 1   aspirin 81 MG chewable tablet, Chew 81 mg by mouth daily., Disp: , Rfl:    blood glucose meter kit and supplies, Dispense based on patient and insurance preference. Use up to four times daily as directed. (FOR ICD-10 E10.9, E11.9). Check fsbs up to twice daily, Disp: 1 each, Rfl: 0   Blood Glucose Monitoring Suppl (FREESTYLE LITE) DEVI, 1 each by Does not apply route daily., Disp: 1 each, Rfl: 0   Certolizumab Pegol 2 X 200 MG/ML KIT, Inject 1 each into the skin every 30 (thirty) days., Disp: , Rfl:    Cholecalciferol 25 MCG (1000 UT) tablet, Take 1,000 Units by mouth in the morning and at bedtime., Disp: , Rfl:    Coenzyme Q10 (COQ10) 100 MG CAPS, Take 100 mg by mouth 2 (two) times daily., Disp: , Rfl:    diclofenac Sodium (VOLTAREN) 1 % GEL, Apply 2 g topically 4 (four) times daily., Disp: 100 g, Rfl: 2   Empagliflozin-metFORMIN HCl ER (SYNJARDY XR) 12.12-998 MG TB24, Take 2 tablets by mouth daily., Disp: 180 tablet, Rfl: 1   fluconazole  (DIFLUCAN) 150 MG tablet, Take 1 tablet (150 mg total) by mouth See admin instructions. Take 150 mg every 3rd day as needed for yeast, Disp: 9 tablet, Rfl: 0   fluticasone (FLONASE) 50 MCG/ACT nasal spray, SHAKE LIQUID AND USE 2 SPRAYS IN EACH NOSTRIL DAILY, Disp: 48 g, Rfl: 0   HYDROcodone-acetaminophen (NORCO) 5-325 MG tablet, Take 1 tablet by mouth every 4 (four) hours as needed., Disp: 30 tablet, Rfl: 0   hydroxychloroquine (PLAQUENIL) 200 MG tablet, Take 200 mg by mouth 2 (two) times daily., Disp: , Rfl:    Lancets (ONETOUCH DELICA PLUS ZCHYIF02D) MISC, TEST UP TO FOUR TIMES DAILY, Disp: 100 each, Rfl: 11   levocetirizine (XYZAL) 5 MG tablet, TAKE 1 TABLET(5 MG) BY MOUTH EVERY EVENING, Disp: 90 tablet, Rfl: 0   montelukast (SINGULAIR) 10 MG  tablet, Take 1 tablet (10 mg total) by mouth at bedtime., Disp: 90 tablet, Rfl: 1   Multiple Vitamin (MULTIVITAMIN WITH MINERALS) TABS tablet, Take 1 tablet by mouth daily., Disp: , Rfl:    omega-3 acid ethyl esters (LOVAZA) 1 g capsule, TAKE 2 CAPSULES BY MOUTH TWICE DAILY, Disp: 360 capsule, Rfl: 2   ONETOUCH ULTRA test strip, TEST UP TO FOUR TIMES DAILY, Disp: 300 strip, Rfl: 1   polyethylene glycol powder (GLYCOLAX/MIRALAX) 17 GM/SCOOP powder, Take 17 g by mouth daily as needed for mild constipation or moderate constipation., Disp: 850 g, Rfl: 0   pregabalin (LYRICA) 75 MG capsule, Take 75-150 mg by mouth See admin instructions. Taking 95m in AM, 1568min PM, Disp: , Rfl:    rosuvastatin (CRESTOR) 40 MG tablet, Take 1 tablet (40 mg total) by mouth daily. (Patient taking differently: Take 40 mg by mouth every evening.), Disp: 90 tablet, Rfl: 1   Semaglutide, 1 MG/DOSE, (OZEMPIC, 1 MG/DOSE,) 4 MG/3ML SOPN, Inject 1 mg into the skin once a week. (Patient taking differently: Inject 1 mg into the skin every Thursday.), Disp: 9 mL, Rfl: 0  Allergies  Allergen Reactions   Ace Inhibitors Cough   Lipofen [Fenofibrate] Other (See Comments)    Localized drug  reaction, blister on back   Penicillins Itching   Sulfa Antibiotics Itching    I personally reviewed active problem list, medication list, allergies, family history, social history, health maintenance with the patient/caregiver today.   ROS  Constitutional: Negative for fever or weight change.  Respiratory: Negative for cough and shortness of breath.   Cardiovascular: Negative for chest pain or palpitations.  Gastrointestinal: Negative for abdominal pain, no bowel changes.  Musculoskeletal: Positive for gait problem but no joint swelling.  Skin: Negative for rash.  Neurological: Negative for dizziness or headache.  No other specific complaints in a complete review of systems (except as listed in HPI above).   Objective  Vitals:   02/02/21 0914  BP: 132/78  Pulse: 100  Resp: 16  Temp: 98.3 F (36.8 C)  TempSrc: Oral  SpO2: 97%  Weight: 217 lb (98.4 kg)  Height: _0  (1.702 m)    Body mass index is 33.99 kg/m.  Physical Exam  Constitutional: Patient appears well-developed and well-nourished. Obese  No distress.  HEENT: head atraumatic, normocephalic, pupils equal and reactive to light,neck supple Cardiovascular: Normal rate, regular rhythm and normal heart sounds.  No murmur heard. No BLE edema. Muscular skeletal: well healed scar from right cubital release, difficulty making a fist on both hands, right worse than left  Pulmonary/Chest: Effort normal and breath sounds normal. No respiratory distress. Abdominal: Soft.  There is no tenderness. Psychiatric: Patient has a normal mood and affect. behavior is normal. Judgment and thought content normal.   Recent Results (from the past 2160 hour(s))  Basic metabolic panel per protocol     Status: Abnormal   Collection Time: 12/10/20 11:50 AM  Result Value Ref Range   Sodium 140 135 - 145 mmol/L   Potassium 3.7 3.5 - 5.1 mmol/L   Chloride 103 98 - 111 mmol/L   CO2 26 22 - 32 mmol/L   Glucose, Bld 128 (H) 70 - 99 mg/dL     Comment: Glucose reference range applies only to samples taken after fasting for at least 8 hours.   BUN 15 8 - 23 mg/dL   Creatinine, Ser 0.56 0.44 - 1.00 mg/dL   Calcium 9.6 8.9 - 10.3 mg/dL   GFR,  Estimated >60 >60 mL/min    Comment: (NOTE) Calculated using the CKD-EPI Creatinine Equation (2021)    Anion gap 11 5 - 15    Comment: Performed at [facility], [address] [place], [place] [address]  CBC per protocol     Status: None   Collection Time: 12/10/20 11:50 AM  Result Value Ref Range   WBC 5.1 4.0 - 10.5 K/uL   RBC 4.17 3.87 - 5.11 MIL/uL   Hemoglobin 12.8 12.0 - 15.0 g/dL   HCT 38.0 36.0 - 46.0 %   MCV 91.1 80.0 - 100.0 fL   MCH 30.7 26.0 - 34.0 pg   MCHC 33.7 30.0 - 36.0 g/dL   RDW 13.6 11.5 - 15.5 %   Platelets 234 150 - 400 K/uL   nRBC 0.0 0.0 - 0.2 %    Comment: Performed at [facility], [address] [place], [place] [address]  Glucose, capillary     Status: Abnormal   Collection Time: 12/16/20  9:04 AM  Result Value Ref Range   Glucose-Capillary 121 (H) 70 - 99 mg/dL    Comment: Glucose reference range applies only to samples taken after fasting for at least 8 hours.  Glucose, capillary     Status: Abnormal   Collection Time: 12/16/20 12:08 PM  Result Value Ref Range   Glucose-Capillary 106 (H) 70 - 99 mg/dL    Comment: Glucose reference range applies only to samples taken after fasting for at least 8 hours.  POCT HgB A1C     Status: Abnormal   Collection Time: 02/02/21  9:21 AM  Result Value Ref Range   Hemoglobin A1C 6.5 (A) 4.0 - 5.6 %   HbA1c POC (<> result, manual entry)     HbA1c, POC (prediabetic range)     HbA1c, POC (controlled diabetic range)        PHQ2/9: Depression screen [facility] 2/9 02/02/2021 09/25/2020 06/30/2020 03/27/2020 12/26/2019  Decreased Interest 0 0 0 0 0  Down, Depressed, Hopeless 0 0 0 0 0  PHQ - 2 Score 0 0 0 0 0  Altered sleeping - - - 0 0  Tired, decreased energy - - - 0 0  Change in appetite - - - 1  0  Feeling bad or failure about yourself  - - - 0 0  Trouble concentrating - - - 0 0  Moving slowly or fidgety/restless - - - 0 0  Suicidal thoughts - - - 0 0  PHQ-9 Score - - - 1 0  Difficult doing work/chores - - - Somewhat difficult Not difficult at all  Some recent data might be hidden    phq 9 is negative  Fall Risk: Fall Risk  02/02/2021 09/25/2020 06/30/2020 03/27/2020 12/26/2019  Falls in the past year? 0 0 0 0 0  Number falls in past yr: 0 0 0 0 0  Injury with Fall? 0 0 0 0 0  Follow up - - - - Falls evaluation completed    Functional Status Survey: Is the patient deaf or have difficulty hearing?: No Does the patient have difficulty seeing, even when wearing glasses/contacts?: No Does the patient have difficulty concentrating, remembering, or making decisions?: No Does the patient have difficulty walking or climbing stairs?: No Does the patient have difficulty dressing or bathing?: No Does the patient have difficulty doing errands alone such as visiting a doctor's office or shopping?: No    Assessment & Plan  1. Diabetes mellitus type 2 in obese (  [place])  - POCT HgB A1C - Semaglutide, 1 MG/DOSE, (OZEMPIC, 1 MG/DOSE,) 4 MG/3ML SOPN; Inject 1 mg into the skin once a week.  Dispense: 9 mL; Refill: 1  2. Perennial allergic rhinitis  - levocetirizine (XYZAL) 5 MG tablet; Take 1 tablet (5 mg total) by mouth every evening.  Dispense: 90 tablet; Refill: 1  3. Dyslipidemia associated with type 2 diabetes mellitus ([place])   4. Hypertension, benign   5. Controlled gout   6. Vitamin D deficiency   7. Rheumatoid arthritis of multiple sites with negative rheumatoid factor ([place])   8. History of decompression of ulnar nerve  - pregabalin (LYRICA) 75 MG capsule; Take 1-2 capsules (75-150 mg total) by mouth 2 (two) times daily. Taking 30m in AM, 1545min PM  Dispense: 270 capsule; Refill: 1

## 2021-02-02 ENCOUNTER — Encounter: Payer: Self-pay | Admitting: Family Medicine

## 2021-02-02 ENCOUNTER — Other Ambulatory Visit: Payer: Self-pay

## 2021-02-02 ENCOUNTER — Ambulatory Visit (INDEPENDENT_AMBULATORY_CARE_PROVIDER_SITE_OTHER): Payer: Managed Care, Other (non HMO) | Admitting: Family Medicine

## 2021-02-02 VITALS — BP 132/78 | HR 100 | Temp 98.3°F | Resp 16 | Ht 67.0 in | Wt 217.0 lb

## 2021-02-02 DIAGNOSIS — E785 Hyperlipidemia, unspecified: Secondary | ICD-10-CM

## 2021-02-02 DIAGNOSIS — J3089 Other allergic rhinitis: Secondary | ICD-10-CM

## 2021-02-02 DIAGNOSIS — E669 Obesity, unspecified: Secondary | ICD-10-CM | POA: Diagnosis not present

## 2021-02-02 DIAGNOSIS — E1169 Type 2 diabetes mellitus with other specified complication: Secondary | ICD-10-CM

## 2021-02-02 DIAGNOSIS — M109 Gout, unspecified: Secondary | ICD-10-CM | POA: Diagnosis not present

## 2021-02-02 DIAGNOSIS — I10 Essential (primary) hypertension: Secondary | ICD-10-CM

## 2021-02-02 DIAGNOSIS — Z9889 Other specified postprocedural states: Secondary | ICD-10-CM

## 2021-02-02 DIAGNOSIS — E559 Vitamin D deficiency, unspecified: Secondary | ICD-10-CM

## 2021-02-02 DIAGNOSIS — M0609 Rheumatoid arthritis without rheumatoid factor, multiple sites: Secondary | ICD-10-CM

## 2021-02-02 LAB — POCT GLYCOSYLATED HEMOGLOBIN (HGB A1C): Hemoglobin A1C: 6.5 % — AB (ref 4.0–5.6)

## 2021-02-02 MED ORDER — OZEMPIC (1 MG/DOSE) 4 MG/3ML ~~LOC~~ SOPN: 1.0000 mg | PEN_INJECTOR | SUBCUTANEOUS | 1 refills | Status: DC

## 2021-02-02 MED ORDER — PREGABALIN 75 MG PO CAPS: 75.0000 mg | ORAL_CAPSULE | Freq: Two times a day (BID) | ORAL | 1 refills | Status: DC

## 2021-02-02 MED ORDER — LEVOCETIRIZINE DIHYDROCHLORIDE 5 MG PO TABS: 5.0000 mg | ORAL_TABLET | Freq: Every evening | ORAL | 1 refills | Status: DC

## 2021-02-17 ENCOUNTER — Ambulatory Visit: Payer: Managed Care, Other (non HMO) | Attending: Orthopedic Surgery | Admitting: Occupational Therapy

## 2021-02-17 DIAGNOSIS — R208 Other disturbances of skin sensation: Secondary | ICD-10-CM | POA: Diagnosis present

## 2021-02-17 DIAGNOSIS — M25521 Pain in right elbow: Secondary | ICD-10-CM | POA: Insufficient documentation

## 2021-02-17 DIAGNOSIS — M25621 Stiffness of right elbow, not elsewhere classified: Secondary | ICD-10-CM | POA: Insufficient documentation

## 2021-02-17 DIAGNOSIS — M6281 Muscle weakness (generalized): Secondary | ICD-10-CM | POA: Insufficient documentation

## 2021-02-17 NOTE — Therapy (Signed)
[facility] [address], [place], [address] Phone: [phone]   Fax:  [phone]  Occupational Therapy Treatment  Patient Details  Name: Maria Cruz MRN: [id number] Date of Birth: May 06, 1956 Referring Provider (OT): Dr Rudene Christians   Encounter Date: 02/17/2021   OT End of Session - 02/17/21 1213     Visit Number 6    Number of Visits 7    Date for OT Re-Evaluation 02/23/21    OT Start Time 1115    OT Stop Time 1146    OT Time Calculation (min) 31 min    Activity Tolerance Patient tolerated treatment well    Behavior During Therapy [facility] for tasks assessed/performed             Past Medical History:  Diagnosis Date   Allergy    Arthritis    Back pain with radiation    Calluse    Foot   Colon polyp    Diabetes mellitus without complication ([place]) 2505   Type II    Fatty liver    GERD (gastroesophageal reflux disease)    History of total hip replacement, left 12/05/2019   [facility]inous [facility]   Hyperlipidemia    Hypertension    Lumbar radiculopathy    Dr. Mack Guise   Obesity    Plantar fasciitis, left    Vitamin D deficiency     Past Surgical History:  Procedure Laterality Date   ABDOMINAL HYSTERECTOMY  2012   ANTERIOR INTEROSSEOUS NERVE DECOMPRESSION Right 12/16/2020   Procedure: Right ulnar nerve release, elbow;  Surgeon: Hessie Knows, MD;  Location: [facility] ORS;  Service: Orthopedics;  Laterality: Right;   APPENDECTOMY  2012   BREAST BIOPSY Right 07/12/2012   Negative   BREAST BIOPSY Left 05/09/2017   Affirm Bx of two areas-FIBROADENOMA WITH COARSE CALCIFICATION. NEGATIVE FOR ATYPIA,COLLAPSED CYST AND FIBROADENOMATOID CHANGE   COLONOSCOPY  2009   Dr Allen Norris   COLONOSCOPY WITH PROPOFOL N/A 07/27/2017   Procedure: COLONOSCOPY WITH PROPOFOL;  Surgeon: Robert Bellow, MD;  Location: [facility] ENDOSCOPY;  Service: Endoscopy;  Laterality: N/A;   DILATION AND CURETTAGE OF  UTERUS  2007   TUBAL LIGATION      There were no vitals filed for this visit.   Subjective Assessment - 02/17/21 1211     Subjective  I am doing much better with my pinkie - still some numbness in the pinkie but more strength and using it but my shoulder bother me more - can you look at it please    Pertinent History Had cubital tunnel symptoms for about year- and had cubital tunnel release on May 11th by Dr Rudene Christians- - refer to OT/hand therapy on 5/13 - pt report symptoms better but had issues for about year and so stiffness and weakness in R hand    Patient Stated Goals Want to be able to use my R hand and arm to do things around the house, and see if I can maybe get part time job again    Currently in Pain? Yes    Pain Score 3     Pain Orientation Right    Pain Descriptors / Indicators Tender;Tightness;Sharp    Pain Type Acute pain    Pain Onset More than a month ago    Pain Frequency Intermittent                [facility] OT Assessment - 02/17/21 0001  Strength   Right Hand Grip (lbs) 53    Right Hand Lateral Pinch 16 lbs    Right Hand 3 Point Pinch 14 lbs    Left Hand Grip (lbs) 53    Left Hand Lateral Pinch 18 lbs    Left Hand 3 Point Pinch 16 lbs                Sensation improving - numbness only now in DIP of 5th  Tinel at [facility]  Strength in elbow and wrist 5/5 Opposition of thumb to 5th - 5/5  4/5 for ADD of 5th  Can push up from chair - no pain And report using it  more than 75% - but pain in shoulder with pouring milk , and washing her back Scar tissue improve      Ulnar N glide - 5 reps cont while she still have pins and needles or numbness in DIP of 5th  Pain free Cont green firm putty for grip, lat and 3 point - no pain  But add pulling with ulnar side of hand 20 reps Can do 2nd set and 3rd set over the next 4 wks Painfree 2 x day     Avoid propping up or sleeping in flexion or cradle elbow position                   OT  Education - 02/17/21 1213     Education Details progress and HEP    Person(s) Educated Patient    Methods Explanation;Handout              OT Short Term Goals - 12/29/20 1351       OT SHORT TERM GOAL #1   Title Pt to be independent in HEP to decrease edema, pain and increase AROM to WNL in R UE to use in all ADL's    Baseline pain increase with ROM and Ulnar N glide to 6/10 - tenderness- edema increase 1 cm - AROM limited in elbow and wrist    Time 2    Period Weeks    Status New    Target Date 01/12/21               OT Long Term Goals - 12/29/20 1359       OT LONG TERM GOAL #1   Title R UE AROM and strength increase for pt to use normall in ADL:s and around the house without increase symptoms    Baseline flexion elbow 145, extention of elbow 0, but wrist decrease and composite Ulnar N glide - pain increase to 6/10 -    Time 4    Period Weeks    Status New    Target Date 01/26/21      OT LONG TERM GOAL #2   Title R grip strength increase to more than 75% compare to L hand to carry groceries without increase symptoms    Baseline Grip NT - but symptoms she had for year prior to surgery - ADD strength decrease in 5th    Time 6    Period Weeks    Status New    Target Date 02/09/21      OT LONG TERM GOAL #3   Title Pt R UE strength increase for pt to use it in ADL's and iADL's without increase symptoms    Baseline pain increase 6/10 - composite Ulnar N glide- decrease flexion and wrist ROM - tenderness cubital tunnel - stitches remove tomorrow- ADD of  5th adn opposition decrease strength    Time 8    Period Weeks    Status New    Target Date 02/23/21                   Plan - 02/17/21 1214     Clinical Impression Statement Pt is about 9 wks s/p R cubital tunnel release - pt show great progress since SOC in digits composite flexion, elbow and wrist  AROM- increase strength in wrist and elbow - grip increased - upgrade this date her putty resistance to  firm green for gripping, lat and 3 point for pt to do next month. [facility]inel now at [facility] and numbness mostly on at 5th DIP - and some pins and needles at night time in 4th and 5th . Pt do report increase pain or discomfort in  R shoulder - had PT screen - appear to have subacromial impingement - will contact Dr Rudene Christians for PT eval order if agree - pt will contact for follow up in month or 6 wks if needed. Progressing well and no from elbow to hand. Pt to cont with Ulnar N glide, grip and prehension strengthening without pain.    OT Occupational Profile and History Problem Focused Assessment - Including review of records relating to presenting problem    Occupational performance deficits (Please refer to evaluation for details): ADL's;IADL's;Rest and Sleep;Play;Leisure    Body Structure / Function / Physical Skills ADL;Flexibility;Edema;IADL;Scar mobility;Sensation;ROM;Pain;Strength;UE functional use    Rehab Potential Good    Clinical Decision Making Limited treatment options, no task modification necessary    Comorbidities Affecting Occupational Performance: None    Modification or Assistance to Complete Evaluation  No modification of tasks or assist necessary to complete eval    OT Frequency Monthly    OT Duration 4 weeks    OT Treatment/Interventions Self-care/ADL training;Cryotherapy;Moist Heat;Contrast Bath;Therapeutic exercise;Manual Therapy;Patient/family education;Scar mobilization;DME and/or AE instruction;Splinting    Consulted and Agree with Plan of Care Patient             Patient will benefit from skilled therapeutic intervention in order to improve the following deficits and impairments:   Body Structure / Function / Physical Skills: ADL, Flexibility, Edema, IADL, Scar mobility, Sensation, ROM, Pain, Strength, UE functional use       Visit Diagnosis: Stiffness of right elbow, not elsewhere classified  Muscle weakness (generalized)  Other disturbances of skin sensation  Pain in  right elbow    Problem List Patient Active Problem List   Diagnosis Date Noted   Cubital tunnel syndrome, right 07/16/2020   Meralgia paraesthetica, left 07/16/2020   Ulnar neuropathy at elbow, right 07/16/2020   Numbness and tingling 06/10/2020   Encounter for long-term (current) use of high-risk medication 06/02/2020   LFT elevation 06/02/2020   Osteoarthritis of spine with radiculopathy, cervical region 06/02/2020   Primary osteoarthritis involving multiple joints 06/02/2020   Rheumatoid arthritis of multiple sites with negative rheumatoid factor ([place]) 06/02/2020   Acute posthemorrhagic anemia 03/27/2020   Hip pain 03/27/2020   Osteoarthritis 03/27/2020   Sinus tachycardia 03/27/2020   History of total hip arthroplasty 12/18/2019   Proteinuria, unspecified 10/10/2019   Microcalcification of left breast on mammogram 05/03/2017   Herniated intervertebral disc of lumbar spine 09/02/2016   Spondylolisthesis of lumbar region 09/02/2016   Controlled type 2 diabetes mellitus with microalbuminuria, without long-term current use of insulin ([place]) 02/24/2015   Vitamin D deficiency 02/24/2015   Perennial allergic rhinitis 02/24/2015   Hypercalcemia  02/24/2015   GERD without esophagitis 02/24/2015   Hypertension, benign 02/24/2015   Controlled gout 02/24/2015   Fatty liver 02/24/2015   Callus of foot 02/24/2015   Primary osteoarthritis of right hip 02/24/2015   History of carpal tunnel syndrome 02/24/2015   Obesity (BMI 30-39.9) 89/78/4784   Umbilical hernia without obstruction or gangrene 02/24/2015   Intermittent low back pain 02/24/2015   History of colonic polyps 02/24/2015   Dyslipidemia 02/24/2015    Rosalyn Gess OTR/L,CLT 02/17/2021, 12:20 PM  [facility] [address], [place], [address] Phone: [phone]   Fax:  [phone]  Name: Maria Cruz MRN: [id number] Date of Birth: [birth year]

## 2021-03-10 ENCOUNTER — Ambulatory Visit: Payer: Managed Care, Other (non HMO) | Attending: Orthopedic Surgery

## 2021-03-10 DIAGNOSIS — M542 Cervicalgia: Secondary | ICD-10-CM | POA: Diagnosis present

## 2021-03-10 DIAGNOSIS — M25511 Pain in right shoulder: Secondary | ICD-10-CM | POA: Insufficient documentation

## 2021-03-10 DIAGNOSIS — M5412 Radiculopathy, cervical region: Secondary | ICD-10-CM | POA: Diagnosis present

## 2021-03-10 DIAGNOSIS — M6281 Muscle weakness (generalized): Secondary | ICD-10-CM | POA: Diagnosis present

## 2021-03-10 NOTE — Therapy (Signed)
[facility] [address], [place], [address] Phone: [phone]   Fax:  [phone]  Physical Therapy Evaluation  Patient Details  Name: Maria Cruz MRN: [id number] Date of Birth: 02/15/1956 Referring Provider (PT): Terrilee Croak, MD  Encounter Date: 03/10/2021   PT End of Session - 03/10/21 1016     Visit Number 1    Number of Visits 17    Date for PT Re-Evaluation 05/07/21    Authorization Type 1    Authorization Time Period 10    PT Start Time 1016    PT Stop Time 1103    PT Time Calculation (min) 47 min    Activity Tolerance Patient tolerated treatment well    Behavior During Therapy [facility] for tasks assessed/performed             Past Medical History:  Diagnosis Date   Allergy    Arthritis    Back pain with radiation    Calluse    Foot   Colon polyp    Diabetes mellitus without complication ([place]) Q000111Q   Type II    Fatty liver    GERD (gastroesophageal reflux disease)    History of total hip replacement, left 12/05/2019   [facility]   Hyperlipidemia    Hypertension    Lumbar radiculopathy    Dr. Mack Guise   Obesity    Plantar fasciitis, left    Vitamin D deficiency     Past Surgical History:  Procedure Laterality Date   ABDOMINAL HYSTERECTOMY  2012   ANTERIOR INTEROSSEOUS NERVE DECOMPRESSION Right 12/16/2020   Procedure: Right ulnar nerve release, elbow;  Surgeon: Hessie Knows, MD;  Location: [facility] ORS;  Service: Orthopedics;  Laterality: Right;   APPENDECTOMY  2012   BREAST BIOPSY Right 07/12/2012   Negative   BREAST BIOPSY Left 05/09/2017   Affirm Bx of two areas-FIBROADENOMA WITH COARSE CALCIFICATION. NEGATIVE FOR ATYPIA,COLLAPSED CYST AND FIBROADENOMATOID CHANGE   COLONOSCOPY  2009   Dr Allen Norris   COLONOSCOPY WITH PROPOFOL N/A 07/27/2017   Procedure: COLONOSCOPY WITH PROPOFOL;  Surgeon: Robert Bellow, MD;  Location: [facility] ENDOSCOPY;   Service: Endoscopy;  Laterality: N/A;   DILATION AND CURETTAGE OF UTERUS  2007   TUBAL LIGATION      There were no vitals filed for this visit.    Subjective Assessment - 03/10/21 1021     Subjective Pain location: R anterior as well as lateral arm. 8/10 currently, and 9/10 at most for the past 3 months.    Pertinent History R shoulder pain. Pain began since April 2021. Pt had L hip surgery and feels like lying on her R side might have bothered her R shoulder, arm, and elbow. Had R ulnar nerve release last month and participated in OT for surgical rehab. Pain as worsened since onset. Has not yet had PT for R shoulder. Pt also feels like her grip is off. Still has swelling at 4th and 5th digits.    Patient Stated Goals Decrease pain.    Currently in Pain? Yes    Pain Score 8     Pain Location Arm    Pain Orientation Right;Anterior;Lateral    Pain Descriptors / Indicators Aching;Sore    Pain Type Chronic pain    Pain Onset More than a month ago    Pain Frequency Constant    Aggravating Factors  Bending at her R elbow (feels like a pinched nerve at biceps/median  nerve area).  raising her R arm up as well as to the side.    Pain Relieving Factors L S/L  wiht R arm propped on pillow. raising her arm up to the side as well as flexion (hurts and feels better)                [facility] PT Assessment - 03/10/21 1020       Assessment   Medical Diagnosis Primary OA involving multiple joints (shoulder)    Referring Provider (PT) Terrilee Croak, MD    Onset Date/Surgical Date 02/19/21   Date PT referral signed   Hand Dominance Right      Precautions   Precaution Comments recent ulnar nerve release      Restrictions   Other Position/Activity Restrictions No known restrictions      Balance Screen   Has the patient fallen in the past 6 months No    Has the patient had a decrease in activity level because of a fear of falling?  No    Is the patient reluctant to leave their home because of a  fear of falling?  No      Home Environment   Additional Comments Pt lives in a 1 story home with daughter. 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      AROM   Right Shoulder Flexion 113 Degrees   146 AAROM with pain, stiff end feel   Right Shoulder ABduction 92 Degrees   113 AAROM with pain, empty end feel   Cervical Flexion WFL    Cervical Extension limited with R lateral shoulder and proximal arm pain    Cervical - Right Side Bend limited    Cervical - Left Side Bend limited with reproduction or R latearl arm and shoulder pain (C5 dermatome)    Cervical - Right Rotation limited with R lateral neck pain    Cervical - Left Rotation WFL with R lateral neck and upper trap pull      Strength   Right Shoulder Flexion 4-/5   with anterior joint pain   Right Shoulder ABduction 4/5    Right Shoulder Internal Rotation 4/5    Right Shoulder External Rotation 4/5   with lateral shoulder pain     Special Tests   Other special tests Unable to raise arm up to 90 degrees in empty can postion due to anterior shoulder pain. Increased R arm pain with shoulder flexion with addition of scapular retraction.                        Objective measurements completed on examination: See above findings.   No latex allergies  Blood pressure is controlled per pt.  DM controlled per pt    Medbridge Access Code [email or web address]  Manual therapy  Seated STM R upper trap/rhomboid muscle area to decrease tension to lower cervical spine  Decreased R arm pain to 6/10 afterwards. Pt states R arm feels better afterwards   Therapeutic exercise  Chin tucks  10x2 Decreased R arm pain.  Improved exercise technique, movement at target joints,  use of target muscles after mod verbal, visual, tactile cues.   Response to treatment Decreased R arm pain   Clinical impression Pt is a 65 year old female who came to physical therapy secondary to R shoulder pain. She also presents with symptoms along the R median nerve and C5/C6 dermatome, reproduction of symptoms with cervical AROM and R shoulder flexion and abduction suggesting both cervical and shoulder joint involvement. Pt also demonstrates poor posture, R shoulder and scapular weakness, and difficulty performing tasks which involve raising her arm as well as reaching. Pt will benefit from skilled physical therapy services to address the aforementioned deficits.                 PT Education - 03/10/21 1519     Education Details ther-ex, HEP, plan of care    Person(s) Educated Patient    Methods Explanation;Demonstration;Tactile cues;Verbal cues;Handout    Comprehension Returned demonstration;Verbalized understanding              PT Short Term Goals - 03/10/21 1306       PT SHORT TERM GOAL #1   Title Pt will be independent with her initial HEP to decrease pain, improve strength and ability to raise her R arm and reach more comfortably.    Baseline Pt has started her HEP (03/10/2021)    Time 3    Period Weeks    Status New    Target Date 04/02/21               PT Long Term Goals - 03/10/21 1308       PT LONG TERM GOAL #1   Title Pt will have a decrease in R arm pain to 5/10 or less at worst to promote ability to raise her R arm up, reach, as well as bend at her elbow more comfortably.    Baseline 9/10 R arm pain at most for the past 3 months (03/10/2021)    Time 8    Period Weeks    Status New    Target Date 05/07/21      PT LONG TERM GOAL #2   Title Pt will improve R shoulder flexion and abduction AROM by at least 20 degrees to promote ability to raise her arm up  more comfortably.    Baseline R shoulder AROM flexion 113 degrees, abduction 92 degrees (03/10/2021)    Time 8    Period Weeks    Status New    Target Date 05/07/21      PT LONG TERM GOAL #3   Title Pt will improve her FOTO score by at least 10 points as a demonstration of improved function.    Baseline Shoulder FOTO 50 (03/10/2021)    Time 8    Period Weeks    Status New    Target Date 05/07/21                    Plan - 03/10/21 1235     Clinical Impression Statement Pt is a 65 year old female who came to physical therapy secondary to R shoulder pain. She also presents with symptoms along the R median nerve and C5/C6 dermatome, reproduction of symptoms with cervical AROM and R shoulder flexion and abduction suggesting both cervical and shoulder joint involvement. Pt also demonstrates poor posture, R shoulder and scapular weakness, and difficulty performing tasks which involve raising her arm as well as reaching. Pt will benefit from skilled physical therapy services to address the  aforementioned deficits.    Personal Factors and Comorbidities Age;Comorbidity 2;Fitness;Past/Current Experience;Time since onset of injury/illness/exacerbation    Comorbidities DM, HTN    Examination-Activity Limitations Lift;Reach Overhead;Toileting;Self Feeding;Carry;Sleep    Stability/Clinical Decision Making Evolving/Moderate complexity    Clinical Decision Making Moderate    Rehab Potential Fair    PT Frequency 2x / week    PT Duration 8 weeks    PT Treatment/Interventions Manual techniques;Neuromuscular re-education;Patient/family education;Therapeutic exercise;Therapeutic activities;Electrical Stimulation;Iontophoresis '4mg'$ /ml Dexamethasone;Dry needling    PT Next Visit Plan posture, scapular, shoulder anterior cervical strengthening, manual techniques, modalities PRN    PT Home Exercise Plan Medbridge Access Code [email or web address]    Consulted and Agree with Plan of Care Patient              Patient will benefit from skilled therapeutic intervention in order to improve the following deficits and impairments:  Pain, Improper body mechanics, Postural dysfunction, Impaired UE functional use, Decreased strength, Decreased range of motion  Visit Diagnosis: Cervicalgia  Radiculopathy, cervical region  Pain in joint of right shoulder     Problem List Patient Active Problem List   Diagnosis Date Noted   Cubital tunnel syndrome, right 07/16/2020   Meralgia paraesthetica, left 07/16/2020   Ulnar neuropathy at elbow, right 07/16/2020   Numbness and tingling 06/10/2020   Encounter for long-term (current) use of high-risk medication 06/02/2020   LFT elevation 06/02/2020   Osteoarthritis of spine with radiculopathy, cervical region 06/02/2020   Primary osteoarthritis involving multiple joints 06/02/2020   Rheumatoid arthritis of multiple sites with negative rheumatoid factor ([place]) 06/02/2020   Acute posthemorrhagic anemia 03/27/2020   Hip pain 03/27/2020   Osteoarthritis 03/27/2020   Sinus tachycardia 03/27/2020   History of total hip arthroplasty 12/18/2019   Proteinuria, unspecified 10/10/2019   Microcalcification of left breast on mammogram 05/03/2017   Herniated intervertebral disc of lumbar spine 09/02/2016   Spondylolisthesis of lumbar region 09/02/2016   Controlled type 2 diabetes mellitus with microalbuminuria, without long-term current use of insulin ([place]) 02/24/2015   Vitamin D deficiency 02/24/2015   Perennial allergic rhinitis 02/24/2015   Hypercalcemia 02/24/2015   GERD without esophagitis 02/24/2015   Hypertension, benign 02/24/2015   Controlled gout 02/24/2015   Fatty liver 02/24/2015   Callus of foot 02/24/2015   Primary osteoarthritis of right hip 02/24/2015   History of carpal tunnel syndrome 02/24/2015   Obesity (BMI 30-39.9) Q000111Q   Umbilical hernia without obstruction or gangrene 02/24/2015   Intermittent low back pain 02/24/2015   History  of colonic polyps 02/24/2015   Dyslipidemia 02/24/2015    Joneen Boers PT, DPT   03/10/2021, 3:24 PM  [facility] [address] [place], [address] Phone: [phone]   Fax:  [phone]  Name: Maria Cruz MRN: [id number] Date of Birth: 01/11/1956

## 2021-03-10 NOTE — Patient Instructions (Signed)
Access Code: [email or web address] URL: [email or web address] Date: 03/10/2021 Prepared by: Joneen Boers  Exercises Seated Cervical Retraction - 1 x daily - 7 x weekly - 3 sets - 10 reps - 5 seconds hold

## 2021-03-12 ENCOUNTER — Ambulatory Visit: Payer: Managed Care, Other (non HMO)

## 2021-03-12 DIAGNOSIS — M542 Cervicalgia: Secondary | ICD-10-CM | POA: Diagnosis not present

## 2021-03-12 DIAGNOSIS — M5412 Radiculopathy, cervical region: Secondary | ICD-10-CM

## 2021-03-12 DIAGNOSIS — M25511 Pain in right shoulder: Secondary | ICD-10-CM

## 2021-03-12 NOTE — Patient Instructions (Signed)
Access Code: [email or web address] URL: [email or web address] Date: 03/12/2021 Prepared by: Joneen Boers  Exercises Seated Cervical Retraction - 1 x daily - 7 x weekly - 3 sets - 10 reps - 5 seconds hold Seated Scapular Retraction - 1 x daily - 7 x weekly - 3 sets - 10 reps - 5 seconds hold

## 2021-03-12 NOTE — Therapy (Signed)
[facility] [address] [place] [address], [place], [address] Phone: [phone]   Fax:  [phone]  Physical Therapy Treatment  Patient Details  Name: Maria Cruz MRN: [id number] Date of Birth: 08/05/1956 Referring Provider (PT): Terrilee Croak, MD   Encounter Date: 03/12/2021   PT End of Session - 03/12/21 0934     Visit Number 2    Number of Visits 17    Date for PT Re-Evaluation 05/07/21    Authorization Type 2    Authorization Time Period 10    PT Start Time 0934    PT Stop Time 1014    PT Time Calculation (min) 40 min    Activity Tolerance Patient tolerated treatment well    Behavior During Therapy [facility] for tasks assessed/performed             Past Medical History:  Diagnosis Date   Allergy    Arthritis    Back pain with radiation    Calluse    Foot   Colon polyp    Diabetes mellitus without complication ([place]) Q000111Q   Type II    Fatty liver    GERD (gastroesophageal reflux disease)    History of total hip replacement, left 12/05/2019   [facility]   Hyperlipidemia    Hypertension    Lumbar radiculopathy    Dr. Mack Guise   Obesity    Plantar fasciitis, left    Vitamin D deficiency     Past Surgical History:  Procedure Laterality Date   ABDOMINAL HYSTERECTOMY  2012   ANTERIOR INTEROSSEOUS NERVE DECOMPRESSION Right 12/16/2020   Procedure: Right ulnar nerve release, elbow;  Surgeon: Hessie Knows, MD;  Location: [facility] ORS;  Service: Orthopedics;  Laterality: Right;   APPENDECTOMY  2012   BREAST BIOPSY Right 07/12/2012   Negative   BREAST BIOPSY Left 05/09/2017   Affirm Bx of two areas-FIBROADENOMA WITH COARSE CALCIFICATION. NEGATIVE FOR ATYPIA,COLLAPSED CYST AND FIBROADENOMATOID CHANGE   COLONOSCOPY  2009   Dr Allen Norris   COLONOSCOPY WITH PROPOFOL N/A 07/27/2017   Procedure: COLONOSCOPY WITH PROPOFOL;  Surgeon: Robert Bellow, MD;  Location: [facility] ENDOSCOPY;   Service: Endoscopy;  Laterality: N/A;   DILATION AND CURETTAGE OF UTERUS  2007   TUBAL LIGATION      There were no vitals filed for this visit.   Subjective Assessment - 03/12/21 0935     Subjective R anterior arm is better. R lateral arm is still sensitive. 8/10 R lateral arm, 6/10 R anterior arm. No R lateral neck and upper trap pain.    Pertinent History R shoulder pain. Pain began since April 2021. Pt had L hip surgery and feels like lying on her R side might have bothered her R shoulder, arm, and elbow. Had R ulnar nerve release last month and participated in OT for surgical rehab. Pain as worsened since onset. Has not yet had PT for R shoulder. Pt also feels like her grip is off. Still has swelling at 4th and 5th digits.    Patient Stated Goals Decrease pain.    Currently in Pain? Yes    Pain Score 8     Pain Onset More than a month ago                                       PT Education - 03/12/21 1004  Education Details ther-ex, HEP    Person(s) Educated Patient    Methods Explanation;Demonstration;Tactile cues;Verbal cues;Handout    Comprehension Returned demonstration;Verbalized understanding              Objective   No latex allergies Blood pressure is controlled per pt. DM controlled per pt     Medbridge Access Code [email or web address]   Manual therapy   Seated STM R upper trap/rhomboid muscle area to decrease tension to lower cervical spine.  Decreased R arm pain.   Seated STM R distal scalene area around first rib.   Decreased R 4th and 5th digit tingling    Therapeutic exercise   Seated manually resisted R scapular depression isometrics in neural 10x3 with 5 second holds    Seated B scapular retraction 10x3 with 5 second holds   Seated manually resisted L scapular rertraction isometrics, targeting the lower trap muscle 10x5 seconds for 2 sets  Chin tucks 10x2 with 5 second holds     Improved exercise technique, movement  at target joints, use of target muscles after mod verbal, visual, tactile cues.    Response to treatment Decreased R 4rth and 5th digit symptoms. Decreased R lateral arm pain to 7/10, and anterior arm to 4/10      Clinical impression Decreased R arm pain with treatement to decrease muscle tension to C6 transverse process as well as decreasing R scalene muscle tension to ulnar nerve, and decreasing cervical extension stress. Pt will benefit from continued skilled physical therapy services to decrease pain, improve strength, ROM, and function.       PT Short Term Goals - 03/10/21 1306       PT SHORT TERM GOAL #1   Title Pt will be independent with her initial HEP to decrease pain, improve strength and ability to raise her R arm and reach more comfortably.    Baseline Pt has started her HEP (03/10/2021)    Time 3    Period Weeks    Status New    Target Date 04/02/21               PT Long Term Goals - 03/10/21 1308       PT LONG TERM GOAL #1   Title Pt will have a decrease in R arm pain to 5/10 or less at worst to promote ability to raise her R arm up, reach, as well as bend at her elbow more comfortably.    Baseline 9/10 R arm pain at most for the past 3 months (03/10/2021)    Time 8    Period Weeks    Status New    Target Date 05/07/21      PT LONG TERM GOAL #2   Title Pt will improve R shoulder flexion and abduction AROM by at least 20 degrees to promote ability to raise her arm up more comfortably.    Baseline R shoulder AROM flexion 113 degrees, abduction 92 degrees (03/10/2021)    Time 8    Period Weeks    Status New    Target Date 05/07/21      PT LONG TERM GOAL #3   Title Pt will improve her FOTO score by at least 10 points as a demonstration of improved function.    Baseline Shoulder FOTO 50 (03/10/2021)    Time 8    Period Weeks    Status New    Target Date 05/07/21  Plan - 03/12/21 1004     Clinical Impression Statement Decreased R  arm pain with treatement to decrease muscle tension to C6 transverse process as well as decreasing R scalene muscle tension to ulnar nerve, and decreasing cervical extension stress. Pt will benefit from continued skilled physical therapy services to decrease pain, improve strength, ROM, and function.    Personal Factors and Comorbidities Age;Comorbidity 2;Fitness;Past/Current Experience;Time since onset of injury/illness/exacerbation    Comorbidities DM, HTN    Examination-Activity Limitations Lift;Reach Overhead;Toileting;Self Feeding;Carry;Sleep    Stability/Clinical Decision Making Evolving/Moderate complexity    Rehab Potential Fair    PT Frequency 2x / week    PT Duration 8 weeks    PT Treatment/Interventions Manual techniques;Neuromuscular re-education;Patient/family education;Therapeutic exercise;Therapeutic activities;Electrical Stimulation;Iontophoresis '4mg'$ /ml Dexamethasone;Dry needling    PT Next Visit Plan posture, scapular, shoulder anterior cervical strengthening, manual techniques, modalities PRN    PT Home Exercise Plan Medbridge Access Code [email or web address]    Consulted and Agree with Plan of Care Patient             Patient will benefit from skilled therapeutic intervention in order to improve the following deficits and impairments:  Pain, Improper body mechanics, Postural dysfunction, Impaired UE functional use, Decreased strength, Decreased range of motion  Visit Diagnosis: Cervicalgia  Radiculopathy, cervical region  Pain in joint of right shoulder     Problem List Patient Active Problem List   Diagnosis Date Noted   Cubital tunnel syndrome, right 07/16/2020   Meralgia paraesthetica, left 07/16/2020   Ulnar neuropathy at elbow, right 07/16/2020   Numbness and tingling 06/10/2020   Encounter for long-term (current) use of high-risk medication 06/02/2020   LFT elevation 06/02/2020   Osteoarthritis of spine with radiculopathy, cervical region 06/02/2020   Primary  osteoarthritis involving multiple joints 06/02/2020   Rheumatoid arthritis of multiple sites with negative rheumatoid factor ([place]) 06/02/2020   Acute posthemorrhagic anemia 03/27/2020   Hip pain 03/27/2020   Osteoarthritis 03/27/2020   Sinus tachycardia 03/27/2020   History of total hip arthroplasty 12/18/2019   Proteinuria, unspecified 10/10/2019   Microcalcification of left breast on mammogram 05/03/2017   Herniated intervertebral disc of lumbar spine 09/02/2016   Spondylolisthesis of lumbar region 09/02/2016   Controlled type 2 diabetes mellitus with microalbuminuria, without long-term current use of insulin ([place]) 02/24/2015   Vitamin D deficiency 02/24/2015   Perennial allergic rhinitis 02/24/2015   Hypercalcemia 02/24/2015   GERD without esophagitis 02/24/2015   Hypertension, benign 02/24/2015   Controlled gout 02/24/2015   Fatty liver 02/24/2015   Callus of foot 02/24/2015   Primary osteoarthritis of right hip 02/24/2015   History of carpal tunnel syndrome 02/24/2015   Obesity (BMI 30-39.9) Q000111Q   Umbilical hernia without obstruction or gangrene 02/24/2015   Intermittent low back pain 02/24/2015   History of colonic polyps 02/24/2015   Dyslipidemia 02/24/2015    Joneen Boers PT, DPT  03/12/2021, 1:13 PM  [facility] [address], [place], [address] Phone: [phone]   Fax:  [phone]  Name: DEOSHA MAMMEN MRN: [id number] Date of Birth: 05-Jul-1956

## 2021-03-17 ENCOUNTER — Ambulatory Visit: Payer: Managed Care, Other (non HMO)

## 2021-03-17 DIAGNOSIS — M542 Cervicalgia: Secondary | ICD-10-CM

## 2021-03-17 DIAGNOSIS — M5412 Radiculopathy, cervical region: Secondary | ICD-10-CM

## 2021-03-17 NOTE — Therapy (Signed)
[facility] [address], [place], [address] Phone: [phone]   Fax:  [phone]  Physical Therapy Treatment  Patient Details  Name: Maria Cruz MRN: [id number] Date of Birth: 03/23/1956 Referring Provider (PT): Terrilee Croak, MD   Encounter Date: 03/17/2021   PT End of Session - 03/17/21 0930     Visit Number 3    Number of Visits 17    Date for PT Re-Evaluation 05/07/21    Authorization Type 3    Authorization Time Period 10    PT Start Time 0930    PT Stop Time [id number]    PT Time Calculation (min) 45 min    Activity Tolerance Patient tolerated treatment well    Behavior During Therapy [facility] for tasks assessed/performed             Past Medical History:  Diagnosis Date   Allergy    Arthritis    Back pain with radiation    Calluse    Foot   Colon polyp    Diabetes mellitus without complication ([place]) [id number]   Type II    Fatty liver    GERD (gastroesophageal reflux disease)    History of total hip replacement, left 12/05/2019   [facility]   Hyperlipidemia    Hypertension    Lumbar radiculopathy    Dr. Mack Guise   Obesity    Plantar fasciitis, left    Vitamin D deficiency     Past Surgical History:  Procedure Laterality Date   ABDOMINAL HYSTERECTOMY  2012   ANTERIOR INTEROSSEOUS NERVE DECOMPRESSION Right 12/16/2020   Procedure: Right ulnar nerve release, elbow;  Surgeon: Hessie Knows, MD;  Location: [facility] ORS;  Service: Orthopedics;  Laterality: Right;   APPENDECTOMY  2012   BREAST BIOPSY Right 07/12/2012   Negative   BREAST BIOPSY Left 05/09/2017   Affirm Bx of two areas-FIBROADENOMA WITH COARSE CALCIFICATION. NEGATIVE FOR ATYPIA,COLLAPSED CYST AND FIBROADENOMATOID CHANGE   COLONOSCOPY  2009   Dr Allen Norris   COLONOSCOPY WITH PROPOFOL N/A 07/27/2017   Procedure: COLONOSCOPY WITH PROPOFOL;  Surgeon: Robert Bellow, MD;  Location: [facility];   Service: Endoscopy;  Laterality: N/A;   DILATION AND CURETTAGE OF UTERUS  2007   TUBAL LIGATION      There were no vitals filed for this visit.   Subjective Assessment - 03/17/21 0932     Subjective It gets better then she lays down at night. 8/10 R lateral shoulder pain currently. Also pickes up her 25 lbs grand baby twins.    Pertinent History R shoulder pain. Pain began since April 2021. Pt had L hip surgery and feels like lying on her R side might have bothered her R shoulder, arm, and elbow. Had R ulnar nerve release last month and participated in OT for surgical rehab. Pain as worsened since onset. Has not yet had PT for R shoulder. Pt also feels like her grip is off. Still has swelling at 4th and 5th digits.    Patient Stated Goals Decrease pain.    Currently in Pain? Yes    Pain Score 8     Pain Onset More than a month ago                                       PT Education - 03/17/21 [id number]  Education Details ther-ex, HEP    Person(s) Educated Patient    Methods Explanation;Demonstration;Tactile cues;Verbal cues;Handout    Comprehension Returned demonstration;Verbalized understanding             Objective   No latex allergies Blood pressure is controlled per pt. DM controlled per pt     Medbridge Access Code [email or web address]   Manual therapy   Seated STM R anterior and lateral arm to decrease soft tissue restrictions.   Decreased pain     Therapeutic exercise   Seated manually resisted R scapular depression isometrics in neural 10x10 with 10 second holds               R first rib stretch 30 seconds x 5  Seated B scapular retraction 10x2 with 10 second holds   Seated manually resisted L scapular rertraction isometrics, targeting the lower trap muscle 10x5 seconds for 2 sets   Chin tucks with seated thoracic extension on chair 10x with 5 second holds  Regular chin tucks 10x5 seconds  Seated manually resisted R triceps extension  isometrics 10x5 seconds for 3 sets  Decreased R lateral arm pain.   Seated manually resisted R shoulder extension isometrics 10x5 seconds for 3 sets  Decreased R lateral arm pain, decreased R anterior lateral arm muscle tension       Improved exercise technique, movement at target joints, use of target muscles after mod verbal, visual, tactile cues.    Response to treatment Decreased R arm pain to 6/10 after session.      Clinical impression Decreased R arm pain with treatment to promote posterior arm and shoulder muscle activation. Continued working on scapular strengthening to decrease upper trap and scalene muscle tension to her neck. Decreased R arm pain to 6/10 after session. Pt will benefit from continued skilled physical therapy services to decrease pain, improve strength, ROM, and function.      PT Short Term Goals - 03/10/21 1306       PT SHORT TERM GOAL #1   Title Pt will be independent with her initial HEP to decrease pain, improve strength and ability to raise her R arm and reach more comfortably.    Baseline Pt has started her HEP (03/10/2021)    Time 3    Period Weeks    Status New    Target Date 04/02/21               PT Long Term Goals - 03/10/21 1308       PT LONG TERM GOAL #1   Title Pt will have a decrease in R arm pain to 5/10 or less at worst to promote ability to raise her R arm up, reach, as well as bend at her elbow more comfortably.    Baseline 9/10 R arm pain at most for the past 3 months (03/10/2021)    Time 8    Period Weeks    Status New    Target Date 05/07/21      PT LONG TERM GOAL #2   Title Pt will improve R shoulder flexion and abduction AROM by at least 20 degrees to promote ability to raise her arm up more comfortably.    Baseline R shoulder AROM flexion 113 degrees, abduction 92 degrees (03/10/2021)    Time 8    Period Weeks    Status New    Target Date 05/07/21      PT LONG TERM GOAL #3   Title Pt will improve her FOTO score  by  at least 10 points as a demonstration of improved function.    Baseline Shoulder FOTO 50 (03/10/2021)    Time 8    Period Weeks    Status New    Target Date 05/07/21                   Plan - 03/17/21 0926     Clinical Impression Statement Decreased R arm pain with treatment to promote posterior arm and shoulder muscle activation. Continued working on scapular strengthening to decrease upper trap and scalene muscle tension to her neck. Decreased R arm pain to 6/10 after session. Pt will benefit from continued skilled physical therapy services to decrease pain, improve strength, ROM, and function.    Personal Factors and Comorbidities Age;Comorbidity 2;Fitness;Past/Current Experience;Time since onset of injury/illness/exacerbation    Comorbidities DM, HTN    Examination-Activity Limitations Lift;Reach Overhead;Toileting;Self Feeding;Carry;Sleep    Stability/Clinical Decision Making Evolving/Moderate complexity    Rehab Potential Fair    PT Frequency 2x / week    PT Duration 8 weeks    PT Treatment/Interventions Manual techniques;Neuromuscular re-education;Patient/family education;Therapeutic exercise;Therapeutic activities;Electrical Stimulation;Iontophoresis '4mg'$ /ml Dexamethasone;Dry needling    PT Next Visit Plan posture, scapular, shoulder anterior cervical strengthening, manual techniques, modalities PRN    PT Home Exercise Plan Medbridge Access Code [email or web address]    Consulted and Agree with Plan of Care Patient             Patient will benefit from skilled therapeutic intervention in order to improve the following deficits and impairments:  Pain, Improper body mechanics, Postural dysfunction, Impaired UE functional use, Decreased strength, Decreased range of motion  Visit Diagnosis: Cervicalgia  Radiculopathy, cervical region     Problem List Patient Active Problem List   Diagnosis Date Noted   Cubital tunnel syndrome, right 07/16/2020   Meralgia paraesthetica, left  07/16/2020   Ulnar neuropathy at elbow, right 07/16/2020   Numbness and tingling 06/10/2020   Encounter for long-term (current) use of high-risk medication 06/02/2020   LFT elevation 06/02/2020   Osteoarthritis of spine with radiculopathy, cervical region 06/02/2020   Primary osteoarthritis involving multiple joints 06/02/2020   Rheumatoid arthritis of multiple sites with negative rheumatoid factor ([place]) 06/02/2020   Acute posthemorrhagic anemia 03/27/2020   Hip pain 03/27/2020   Osteoarthritis 03/27/2020   Sinus tachycardia 03/27/2020   History of total hip arthroplasty 12/18/2019   Proteinuria, unspecified 10/10/2019   Microcalcification of left breast on mammogram 05/03/2017   Herniated intervertebral disc of lumbar spine 09/02/2016   Spondylolisthesis of lumbar region 09/02/2016   Controlled type 2 diabetes mellitus with microalbuminuria, without long-term current use of insulin ([place]) 02/24/2015   Vitamin D deficiency 02/24/2015   Perennial allergic rhinitis 02/24/2015   Hypercalcemia 02/24/2015   GERD without esophagitis 02/24/2015   Hypertension, benign 02/24/2015   Controlled gout 02/24/2015   Fatty liver 02/24/2015   Callus of foot 02/24/2015   Primary osteoarthritis of right hip 02/24/2015   History of carpal tunnel syndrome 02/24/2015   Obesity (BMI 30-39.9) Q000111Q   Umbilical hernia without obstruction or gangrene 02/24/2015   Intermittent low back pain 02/24/2015   History of colonic polyps 02/24/2015   Dyslipidemia 02/24/2015    Joneen Boers PT, DPT  03/17/2021, 1:12 PM  [facility] [address], [place], [address] Phone: [phone]   Fax:  [phone]  Name: Maria Cruz MRN: [id number] Date of Birth: 08-19-1955

## 2021-03-17 NOTE — Patient Instructions (Signed)
Access Code: [email or web address] URL: [email or web address] Date: 03/17/2021 Prepared by: Joneen Boers  Exercises Seated Cervical Retraction - 1 x daily - 7 x weekly - 3 sets - 10 reps - 5 seconds hold Seated Scapular Retraction - 1 x daily - 7 x weekly - 3 sets - 10 reps - 5 seconds hold First Rib Mobilization with Strap - 3 x daily - 7 x weekly - 1 sets - 3 reps - 30 seconds hold

## 2021-03-19 ENCOUNTER — Ambulatory Visit: Payer: Managed Care, Other (non HMO)

## 2021-03-19 DIAGNOSIS — M542 Cervicalgia: Secondary | ICD-10-CM | POA: Diagnosis not present

## 2021-03-19 DIAGNOSIS — M5412 Radiculopathy, cervical region: Secondary | ICD-10-CM

## 2021-03-19 DIAGNOSIS — M25511 Pain in right shoulder: Secondary | ICD-10-CM

## 2021-03-19 NOTE — Therapy (Signed)
[facility] [address], [place], [address] Phone: [phone]   Fax:  [phone]  Physical Therapy Treatment  Patient Details  Name: Maria Cruz MRN: [id number] Date of Birth: 1956/01/08 Referring Provider (PT): Terrilee Croak, MD   Encounter Date: 03/19/2021   PT End of Session - 03/19/21 1548     Visit Number 4    Number of Visits 17    Date for PT Re-Evaluation 05/07/21    Authorization Type 3    Authorization Time Period 10    PT Start Time 1548    PT Stop Time 1632    PT Time Calculation (min) 44 min    Activity Tolerance Patient tolerated treatment well    Behavior During Therapy [facility] for tasks assessed/performed             Past Medical History:  Diagnosis Date   Allergy    Arthritis    Back pain with radiation    Calluse    Foot   Colon polyp    Diabetes mellitus without complication ([place]) Q000111Q   Type II    Fatty liver    GERD (gastroesophageal reflux disease)    History of total hip replacement, left 12/05/2019   [facility]   Hyperlipidemia    Hypertension    Lumbar radiculopathy    Dr. Mack Guise   Obesity    Plantar fasciitis, left    Vitamin D deficiency     Past Surgical History:  Procedure Laterality Date   ABDOMINAL HYSTERECTOMY  2012   ANTERIOR INTEROSSEOUS NERVE DECOMPRESSION Right 12/16/2020   Procedure: Right ulnar nerve release, elbow;  Surgeon: Hessie Knows, MD;  Location: [facility] ORS;  Service: Orthopedics;  Laterality: Right;   APPENDECTOMY  2012   BREAST BIOPSY Right 07/12/2012   Negative   BREAST BIOPSY Left 05/09/2017   Affirm Bx of two areas-FIBROADENOMA WITH COARSE CALCIFICATION. NEGATIVE FOR ATYPIA,COLLAPSED CYST AND FIBROADENOMATOID CHANGE   COLONOSCOPY  2009   Dr Allen Norris   COLONOSCOPY WITH PROPOFOL N/A 07/27/2017   Procedure: COLONOSCOPY WITH PROPOFOL;  Surgeon: Robert Bellow, MD;  Location: [facility] END[facility];   Service: Endoscopy;  Laterality: N/A;   DILATION AND CURETTAGE OF UTERUS  2007   TUBAL LIGATION      There were no vitals filed for this visit.   Subjective Assessment - 03/19/21 1550     Subjective R arm is doing better today. 5/10 anterior arm currently.    Pertinent History R shoulder pain. Pain began since April 2021. Pt had L hip surgery and feels like lying on her R side might have bothered her R shoulder, arm, and elbow. Had R ulnar nerve release last month and participated in OT for surgical rehab. Pain as worsened since onset. Has not yet had PT for R shoulder. Pt also feels like her grip is off. Still has swelling at 4th and 5th digits.    Patient Stated Goals Decrease pain.    Currently in Pain? Yes    Pain Score 5     Pain Onset More than a month ago                                       PT Education - 03/19/21 1553     Education Details ther-ex    Person(s) Educated Patient    Methods Explanation;Demonstration;Tactile  cues;Verbal cues    Comprehension Returned demonstration;Verbalized understanding           Objective   No latex allergies Blood pressure is controlled per pt. DM controlled per pt     Medbridge Access Code [email or web address]   Manual therapy  Supine STM posterior cervical paraspinal msucles to decrease tension.    Decreased pain in R arm     Therapeutic exercise  Supine cervical nod 10x5 seconds  Supine chin tucks 10x2 with 5 second holds   Supine deep cervical flexion 5x3 with PT assist for proper form during first set  Supine with pressing tongue at the roof her her mouth  R and L cervical rotation with PT assist for proper form 10x3  Supine B scapular retraction 10x5 seconds for 3 sets  Supine L cervical side bend to stretch R muscles 10x10 seconds for 2 sets  R 5th digit feels stronger afterwards per pt.    Improved exercise technique, movement at target joints, use of target muscles after mod verbal,  visual, tactile cues.    Response to treatment Decreased R arm pain and improved R 5th digit sensation.      Clinical impression Good carry over of decreased pain from previous session based on subjective reports. Worked on increasing anterior cervical muscle strength to promote better cervical movement and decrease stress to nerves to R UE. Decreased R arm pain reported afterwards. Pt will benefit from continued skilled physical therapy services to decrease pain, improve strength, ROM, and function.        PT Short Term Goals - 03/10/21 1306       PT SHORT TERM GOAL #1   Title Pt will be independent with her initial HEP to decrease pain, improve strength and ability to raise her R arm and reach more comfortably.    Baseline Pt has started her HEP (03/10/2021)    Time 3    Period Weeks    Status New    Target Date 04/02/21               PT Long Term Goals - 03/10/21 1308       PT LONG TERM GOAL #1   Title Pt will have a decrease in R arm pain to 5/10 or less at worst to promote ability to raise her R arm up, reach, as well as bend at her elbow more comfortably.    Baseline 9/10 R arm pain at most for the past 3 months (03/10/2021)    Time 8    Period Weeks    Status New    Target Date 05/07/21      PT LONG TERM GOAL #2   Title Pt will improve R shoulder flexion and abduction AROM by at least 20 degrees to promote ability to raise her arm up more comfortably.    Baseline R shoulder AROM flexion 113 degrees, abduction 92 degrees (03/10/2021)    Time 8    Period Weeks    Status New    Target Date 05/07/21      PT LONG TERM GOAL #3   Title Pt will improve her FOTO score by at least 10 points as a demonstration of improved function.    Baseline Shoulder FOTO 50 (03/10/2021)    Time 8    Period Weeks    Status New    Target Date 05/07/21                   Plan -  03/19/21 1553     Clinical Impression Statement Good carry over of decreased pain from previous  session based on subjective reports. Worked on increasing anterior cervical muscle strength to promote better cervical movement and decrease stress to nerves to R UE. Decreased R arm pain reported afterwards. Pt will benefit from continued skilled physical therapy services to decrease pain, improve strength, ROM, and function.    Personal Factors and Comorbidities Age;Comorbidity 2;Fitness;Past/Current Experience;Time since onset of injury/illness/exacerbation    Comorbidities DM, HTN    Examination-Activity Limitations Lift;Reach Overhead;Toileting;Self Feeding;Carry;Sleep    Stability/Clinical Decision Making Evolving/Moderate complexity    Clinical Decision Making Low    Rehab Potential Fair    PT Frequency 2x / week    PT Duration 8 weeks    PT Treatment/Interventions Manual techniques;Neuromuscular re-education;Patient/family education;Therapeutic exercise;Therapeutic activities;Electrical Stimulation;Iontophoresis '4mg'$ /ml Dexamethasone;Dry needling    PT Next Visit Plan posture, scapular, shoulder anterior cervical strengthening, manual techniques, modalities PRN    PT Home Exercise Plan Medbridge Access Code [email or web address]    Consulted and Agree with Plan of Care Patient             Patient will benefit from skilled therapeutic intervention in order to improve the following deficits and impairments:  Pain, Improper body mechanics, Postural dysfunction, Impaired UE functional use, Decreased strength, Decreased range of motion  Visit Diagnosis: Cervicalgia  Radiculopathy, cervical region  Pain in joint of right shoulder     Problem List Patient Active Problem List   Diagnosis Date Noted   Cubital tunnel syndrome, right 07/16/2020   Meralgia paraesthetica, left 07/16/2020   Ulnar neuropathy at elbow, right 07/16/2020   Numbness and tingling 06/10/2020   Encounter for long-term (current) use of high-risk medication 06/02/2020   LFT elevation 06/02/2020   Osteoarthritis of spine  with radiculopathy, cervical region 06/02/2020   Primary osteoarthritis involving multiple joints 06/02/2020   Rheumatoid arthritis of multiple sites with negative rheumatoid factor ([place]) 06/02/2020   Acute posthemorrhagic anemia 03/27/2020   Hip pain 03/27/2020   Osteoarthritis 03/27/2020   Sinus tachycardia 03/27/2020   History of total hip arthroplasty 12/18/2019   Proteinuria, unspecified 10/10/2019   Microcalcification of left breast on mammogram 05/03/2017   Herniated intervertebral disc of lumbar spine 09/02/2016   Spondylolisthesis of lumbar region 09/02/2016   Controlled type 2 diabetes mellitus with microalbuminuria, without long-term current use of insulin ([place]) 02/24/2015   Vitamin D deficiency 02/24/2015   Perennial allergic rhinitis 02/24/2015   Hypercalcemia 02/24/2015   GERD without esophagitis 02/24/2015   Hypertension, benign 02/24/2015   Controlled gout 02/24/2015   Fatty liver 02/24/2015   Callus of foot 02/24/2015   Primary osteoarthritis of right hip 02/24/2015   History of carpal tunnel syndrome 02/24/2015   Obesity (BMI 30-39.9) Q000111Q   Umbilical hernia without obstruction or gangrene 02/24/2015   Intermittent low back pain 02/24/2015   History of colonic polyps 02/24/2015   Dyslipidemia 02/24/2015    Joneen Boers PT, DPT   03/19/2021, 5:45 PM  [facility] [address] [place], [address] Phone: [phone]   Fax:  [phone]  Name: Maria Cruz MRN: [id number] Date of Birth: [birth year]

## 2021-03-24 ENCOUNTER — Ambulatory Visit: Payer: Managed Care, Other (non HMO)

## 2021-03-24 DIAGNOSIS — M25511 Pain in right shoulder: Secondary | ICD-10-CM

## 2021-03-24 DIAGNOSIS — M542 Cervicalgia: Secondary | ICD-10-CM

## 2021-03-24 DIAGNOSIS — M5412 Radiculopathy, cervical region: Secondary | ICD-10-CM

## 2021-03-24 NOTE — Therapy (Signed)
[facility] [address], [place], [address] Phone: [phone]   Fax:  [phone]  Physical Therapy Treatment  Patient Details  Name: Maria Cruz MRN: [id number] Date of Birth: 07-24-1956 Referring Provider (PT): Terrilee Croak, MD   Encounter Date: 03/24/2021   PT End of Session - 03/24/21 0945     Visit Number 5    Number of Visits 17    Date for PT Re-Evaluation 05/07/21    Authorization Type [employer] Managed    Authorization Time Period Cert [id number]    Progress Note Due on Visit 10    PT Start Time 0935    PT Stop Time 1013    PT Time Calculation (min) 38 min    Activity Tolerance Patient tolerated treatment well;Patient limited by pain    Behavior During Therapy [facility] for tasks assessed/performed             Past Medical History:  Diagnosis Date   Allergy    Arthritis    Back pain with radiation    Calluse    Foot   Colon polyp    Diabetes mellitus without complication ([place]) [id number]   Type II    Fatty liver    GERD (gastroesophageal reflux disease)    History of total hip replacement, left 12/05/2019   [facility]   Hyperlipidemia    Hypertension    Lumbar radiculopathy    Dr. Mack Guise   Obesity    Plantar fasciitis, left    Vitamin D deficiency     Past Surgical History:  Procedure Laterality Date   ABDOMINAL HYSTERECTOMY  2012   ANTERIOR INTEROSSEOUS NERVE DECOMPRESSION Right 12/16/2020   Procedure: Right ulnar nerve release, elbow;  Surgeon: Hessie Knows, MD;  Location: [facility] ORS;  Service: Orthopedics;  Laterality: Right;   APPENDECTOMY  2012   BREAST BIOPSY Right 07/12/2012   Negative   BREAST BIOPSY Left 05/09/2017   Affirm Bx of two areas-FIBROADENOMA WITH COARSE CALCIFICATION. NEGATIVE FOR ATYPIA,COLLAPSED CYST AND FIBROADENOMATOID CHANGE   COLONOSCOPY  2009   Dr Allen Norris   COLONOSCOPY WITH PROPOFOL N/A 07/27/2017   Procedure:  COLONOSCOPY WITH PROPOFOL;  Surgeon: Robert Bellow, MD;  Location: [facility] ENDOSCOPY;  Service: Endoscopy;  Laterality: N/A;   DILATION AND CURETTAGE OF UTERUS  2007   TUBAL LIGATION      There were no vitals filed for this visit.   Subjective Assessment - 03/24/21 0939     Subjective Pt doing ok today, no major improvements in shoulder pain, still around 8/10 today. Pt says HEP is being performed with consistentcy. Pt denies any updates to medicaitons or recent MD visits.    Pertinent History R shoulder pain. Pain began since April 2021. Pt had L hip surgery and feels like lying on her R side might have bothered her R shoulder, arm, and elbow. Had R ulnar nerve release last month and participated in OT for surgical rehab. Pain as worsened since onset. Has not yet had PT for R shoulder. Pt also feels like her grip is off. Still has swelling at 4th and 5th digits.    Patient Stated Goals Decrease pain.    Currently in Pain? Yes    Pain Score 8     Pain Location Shoulder    Pain Orientation Right            Brachial circumference  Left at distal segment, elbow at 90*: 29.5cm (31cm  Rt) Left at mid segment, elbow at 90 degrees: 36.5cm (34cm Lt)   -Trial of Moist Heat to Right shoulder 5 minutes, good reduction in pain intensity, asked pt to trial at home  -Flexion table slides 3x30sec hold bilat, 1x15x1secH -ABDCT table slides 1x15x1secH  -Trigger point release 5 minutes, posterior, middle, and anterior (palpable reduction in taut band tone)  -seated RUE [facility] ER in scaption (elbow resting on table) 1x15 c 2lbFW -standing RUE shoulder abduction to 90  1x15 (no weight)  -seated RUE [facility] ER in scaption (elbow resting on table) 1x15 c 3lbFW -standing BUE elbow extension, GTB drapped across neck 1x15  -standing RUE shoulder abduction to 90  1x15 (no weight)        PT Education - 03/24/21 0944     Education Details continued educaiton on symptoms modification    Person(s) Educated  Patient    Methods Explanation;Demonstration    Comprehension Verbalized understanding;Returned demonstration              PT Short Term Goals - 03/10/21 1306       PT SHORT TERM GOAL #1   Title Pt will be independent with her initial HEP to decrease pain, improve strength and ability to raise her R arm and reach more comfortably.    Baseline Pt has started her HEP (03/10/2021)    Time 3    Period Weeks    Status New    Target Date 04/02/21               PT Long Term Goals - 03/10/21 1308       PT LONG TERM GOAL #1   Title Pt will have a decrease in R arm pain to 5/10 or less at worst to promote ability to raise her R arm up, reach, as well as bend at her elbow more comfortably.    Baseline 9/10 R arm pain at most for the past 3 months (03/10/2021)    Time 8    Period Weeks    Status New    Target Date 05/07/21      PT LONG TERM GOAL #2   Title Pt will improve R shoulder flexion and abduction AROM by at least 20 degrees to promote ability to raise her arm up more comfortably.    Baseline R shoulder AROM flexion 113 degrees, abduction 92 degrees (03/10/2021)    Time 8    Period Weeks    Status New    Target Date 05/07/21      PT LONG TERM GOAL #3   Title Pt will improve her FOTO score by at least 10 points as a demonstration of improved function.    Baseline Shoulder FOTO 50 (03/10/2021)    Time 8    Period Weeks    Status New    Target Date 05/07/21                   Plan - 03/24/21 0947     Clinical Impression Statement Continued to work toward treatment goals based on objective tests and measures form evaluation. Pt finds a reduction in pain intensity over session, moreso after moderate intensity resistance exercise. Pt continues to make gradual progress toward goals in general, but would continue to benefit from skilled services to maximize return to PLOF in independence in ADL, IADL.    Personal Factors and Comorbidities Age;Comorbidity  2;Fitness;Past/Current Experience;Time since onset of injury/illness/exacerbation    Comorbidities DM, HTN    Examination-Activity Limitations  Lift;Reach Overhead;Toileting;Self Feeding;Carry;Sleep    Stability/Clinical Decision Making Evolving/Moderate complexity    Clinical Decision Making Low    Rehab Potential Fair    PT Frequency 2x / week    PT Duration 8 weeks    PT Treatment/Interventions Manual techniques;Neuromuscular re-education;Patient/family education;Therapeutic exercise;Therapeutic activities;Electrical Stimulation;Iontophoresis '4mg'$ /ml Dexamethasone;Dry needling    PT Next Visit Plan posture, scapular, shoulder anterior cervical strengthening, manual techniques, modalities PRN    PT Home Exercise Plan Medbridge Access Code [email or web address]    Consulted and Agree with Plan of Care Patient             Patient will benefit from skilled therapeutic intervention in order to improve the following deficits and impairments:  Pain, Improper body mechanics, Postural dysfunction, Impaired UE functional use, Decreased strength, Decreased range of motion  Visit Diagnosis: Cervicalgia  Radiculopathy, cervical region  Pain in joint of right shoulder     Problem List Patient Active Problem List   Diagnosis Date Noted   Cubital tunnel syndrome, right 07/16/2020   Meralgia paraesthetica, left 07/16/2020   Ulnar neuropathy at elbow, right 07/16/2020   Numbness and tingling 06/10/2020   Encounter for long-term (current) use of high-risk medication 06/02/2020   LFT elevation 06/02/2020   Osteoarthritis of spine with radiculopathy, cervical region 06/02/2020   Primary osteoarthritis involving multiple joints 06/02/2020   Rheumatoid arthritis of multiple sites with negative rheumatoid factor ([place]) 06/02/2020   Acute posthemorrhagic anemia 03/27/2020   Hip pain 03/27/2020   Osteoarthritis 03/27/2020   Sinus tachycardia 03/27/2020   History of total hip arthroplasty 12/18/2019    Proteinuria, unspecified 10/10/2019   Microcalcification of left breast on mammogram 05/03/2017   Herniated intervertebral disc of lumbar spine 09/02/2016   Spondylolisthesis of lumbar region 09/02/2016   Controlled type 2 diabetes mellitus with microalbuminuria, without long-term current use of insulin ([place]) 02/24/2015   Vitamin D deficiency 02/24/2015   Perennial allergic rhinitis 02/24/2015   Hypercalcemia 02/24/2015   GERD without esophagitis 02/24/2015   Hypertension, benign 02/24/2015   Controlled gout 02/24/2015   Fatty liver 02/24/2015   Callus of foot 02/24/2015   Primary osteoarthritis of right hip 02/24/2015   History of carpal tunnel syndrome 02/24/2015   Obesity (BMI 30-39.9) Q000111Q   Umbilical hernia without obstruction or gangrene 02/24/2015   Intermittent low back pain 02/24/2015   History of colonic polyps 02/24/2015   Dyslipidemia 02/24/2015   10:16 AM, 03/24/21 Etta Grandchild, PT, DPT Physical Therapist - [place] [phone] (Office)   Levis Nazir C 03/24/2021, 9:55 AM  [facility] [address] [place], [address] Phone: [phone]   Fax:  [phone]  Name: JENITZA SKELLEY MRN: [id number] Date of Birth: 1955-11-02

## 2021-03-25 ENCOUNTER — Ambulatory Visit: Payer: Self-pay | Admitting: *Deleted

## 2021-03-25 ENCOUNTER — Other Ambulatory Visit: Payer: Self-pay

## 2021-03-25 ENCOUNTER — Encounter: Payer: Self-pay | Admitting: Family Medicine

## 2021-03-25 ENCOUNTER — Telehealth (INDEPENDENT_AMBULATORY_CARE_PROVIDER_SITE_OTHER): Payer: Managed Care, Other (non HMO) | Admitting: Family Medicine

## 2021-03-25 DIAGNOSIS — J069 Acute upper respiratory infection, unspecified: Secondary | ICD-10-CM | POA: Diagnosis not present

## 2021-03-25 NOTE — Telephone Encounter (Signed)
Reason for Disposition  Common cold with no complications  Answer Assessment - Initial Assessment Questions 1. ONSET: "When did the nasal discharge start?"      I'm having a sore throat, nasal congestion, coughing, for 3 days now.  Last night my sore throat has gotten worse.   When I was younger I had strep throat.   This feels similar.    No done a Covid test.  I wanted to get Covid test.   2. AMOUNT: "How much discharge is there?"      I'm coughing up mucus and blowing my nose.    It's yellow but this morning it's clear.     3. COUGH: "Do you have a cough?" If yes, ask: "Describe the color of your sputum" (clear, white, yellow, green)     Yes coughing up clear mucus.  4. RESPIRATORY DISTRESS: "Describe your breathing."      No fine 5. FEVER: "Do you have a fever?" If Yes, ask: "What is your temperature, how was it measured, and when did it start?"     I'm having chills at night.    I don't have a thermometer.   No body aches.  No vomiting.   No diarrhea. 6. SEVERITY: "Overall, how bad are you feeling right now?" (e.g., doesn't interfere with normal activities, staying home from school/work, staying in bed)      Better today other than my throat is still bothering me.   I'm having drainage down the back of my throat and clearing my throat a lot.   7. OTHER SYMPTOMS: "Do you have any other symptoms?" (e.g., sore throat, earache, wheezing, vomiting)     No lung history.    I have diabetes and hypertension.   I have Rheumatoid arthritis.   I take a shot every month for it.    8. PREGNANCY: "Is there any chance you are pregnant?" "When was your last menstrual period?"     N/A due age  Protocols used: Common Cold-A-AH

## 2021-03-25 NOTE — Telephone Encounter (Signed)
Pt called in c/o having a sore throat, coughing up clear mucus, drainage down the back of her throat and clearing her throat a lot, sinus congestion and chills at night for the last 3 days.   The sore throat is bothering her more than anything.   She is wondering if she has strep throat.   No exposures that she is aware of but had strep throat as a child and this feels similar.   Like it's her tonsils are sore.     She is going to do a home Covid test and call us back with her result.   I let her know I would pass her symptoms along to Dr. Ancil Boozer.   If pt's Covid test is positive she has high risk factors:  hypertension, diabetes and RA that she gets a monthly shot for Certolizumab Pegol.  I instructed her to call us back and let us know if her Covid test is positive or negative.   She is agreeable to this and will call us back.  I sent this information to [facility] for Dr. Ancil Boozer.

## 2021-03-25 NOTE — Patient Instructions (Signed)
You have a cold and it should start to get better about 7 - 10 days after it started.    For your nasal congestion and runny nose, try using Afrin (generic is Oxymetazoline) twice daily for 3 days.  Do not use for longer that 3 days.    Some other therapies you can try are: push fluids, rest, use vaporizer or mist as needed. Continue to use mucinex and robatussin as needed.   Drinking warm liquids such as teas and soups can help with secretions and cough. A mist humidifier or vaporizer can work well to help with secretions and cough.  It is very important to clean the humidifier between use according to the instructions.    It was good to see you.  If you're still having trouble in the next week, come back and see [place].    Of course, if you start having trouble breathing, worsening fevers, vomiting and unable to hold down any fluids, or you have other concerns, don't hesitate to come back or go to the ED after hours.

## 2021-03-25 NOTE — Progress Notes (Signed)
Virtual Visit via Video Note  I connected with Maria Cruz on 03/25/21 at  4:00 PM EDT by a video enabled telemedicine application and verified that I am speaking with the correct person using two identifiers.  Location: Patient: home Provider: [facility]   I discussed the limitations of evaluation and management by telemedicine and the availability of in person appointments. The patient expressed understanding and agreed to proceed.  History of Present Illness:  UPPER RESPIRATORY TRACT INFECTION - symptom onset 1 week ago - has received 3 doses of mRNA COVID vaccine - COVID negative this morning. - originally with sore throat, now better  Worst symptom: Fever:  subjective Cough: yes, phelgm Shortness of breath: no Wheezing: no Chest pain: no Chest tightness: no Chest congestion: yes Nasal congestion: yes Runny nose: no Sore throat: no Headache: no Ear pain: no  Ear pressure: yes left Eyes red/itching:no Eye drainage/crusting: no  Vomiting: no Sick contacts: no Context: better Recurrent sinusitis: no Relief with OTC cold/cough medications: yes  Treatments attempted: flonase, alka seltzer plus, robatussin cold/flu, mucinex    Observations/Objective:  Well appearing, in NAD. Speaks in full sentences, no respiratory distress.   Assessment and Plan:  Viral URI Doing well with mild sx, improving. COVID negative. Reviewed OTC symptom relief and emergency precautions.     I discussed the assessment and treatment plan with the patient. The patient was provided an opportunity to ask questions and all were answered. The patient agreed with the plan and demonstrated an understanding of the instructions.   The patient was advised to call back or seek an in-person evaluation if the symptoms worsen or if the condition fails to improve as anticipated.  I provided 13 minutes of non-face-to-face time during this encounter.   Myles Gip, DO

## 2021-03-25 NOTE — Telephone Encounter (Signed)
Pt has an Virtual appt for this afternoon with Dr Ky Barban

## 2021-03-26 ENCOUNTER — Other Ambulatory Visit: Payer: Self-pay | Admitting: Family Medicine

## 2021-03-26 ENCOUNTER — Ambulatory Visit: Payer: Managed Care, Other (non HMO)

## 2021-03-26 DIAGNOSIS — E669 Obesity, unspecified: Secondary | ICD-10-CM

## 2021-03-26 DIAGNOSIS — M542 Cervicalgia: Secondary | ICD-10-CM | POA: Diagnosis not present

## 2021-03-26 DIAGNOSIS — M5412 Radiculopathy, cervical region: Secondary | ICD-10-CM

## 2021-03-26 DIAGNOSIS — E1169 Type 2 diabetes mellitus with other specified complication: Secondary | ICD-10-CM

## 2021-03-26 NOTE — Telephone Encounter (Signed)
Requested Prescriptions  Pending Prescriptions Disp Refills  . SYNJARDY XR 12.12-998 MG TB24 [Pharmacy Med Name: SYNJARDY XR 12.5MG-1000MG TABLET] 180 tablet 1    Sig: TAKE 2 TABLETS BY MOUTH DAILY     Endocrinology:  Diabetes - Biguanide + SGLT2 Inhibitor Combos Failed - 03/26/2021  3:13 AM      Failed - LDL in normal range and within 360 days    LDL Chol Calc (NIH)  Date Value Ref Range Status  03/27/2020 64 0 - 99 mg/dL Final         Failed - AA eGFR in normal range and within 360 days    GFR calc Af Amer  Date Value Ref Range Status  03/27/2020 92 >59 mL/min/1.73 Final    Comment:    **[facility] currently reports eGFR in compliance with the current**   recommendations of the [employer]. [facility] will   update reporting as new guidelines are published from the NKF-ASN   Task force.    GFR, Estimated  Date Value Ref Range Status  12/10/2020 >60 >60 mL/min Final    Comment:    (NOTE) Calculated using the CKD-EPI Creatinine Equation (2021)          Passed - Cr in normal range and within 360 days    Creatinine, Ser  Date Value Ref Range Status  12/10/2020 0.56 0.44 - 1.00 mg/dL Final   Creatinine, Urine  Date Value Ref Range Status  06/05/2018 63 20 - 275 mg/dL Final         Passed - HBA1C is between 0 and 7.9 and within 180 days    Hemoglobin A1C  Date Value Ref Range Status  02/02/2021 6.5 (A) 4.0 - 5.6 % Final   HbA1c, POC (prediabetic range)  Date Value Ref Range Status  07/27/2018 7.4 (A) 5.7 - 6.4 % Final   HbA1c, POC (controlled diabetic range)  Date Value Ref Range Status  02/28/2019 7.3 (A) 0.0 - 7.0 % Final   Hgb A1c MFr Bld  Date Value Ref Range Status  11/29/2019 6.9 (H) 4.8 - 5.6 % Final    Comment:             Prediabetes: 5.7 - 6.4          Diabetes: >6.4          Glycemic control for adults with diabetes: <7.0          Passed - Valid encounter within last 6 months    Recent Outpatient Visits          Yesterday Viral URI    [facility] Rory Percy M, DO   1 month ago Diabetes mellitus type 2 in obese [facility])   [facility] [place], Drue Stager, MD   6 months ago Diabetes mellitus type 2 in obese [facility])   [facility] [place], Drue Stager, MD   8 months ago Diabetes mellitus type 2 in obese [facility])   [facility] Steele Sizer, MD   12 months ago Diabetes mellitus type 2 in obese [facility])   [facility] Steele Sizer, MD      Future Appointments            In 2 months Ancil Boozer, Drue Stager, MD [facility]

## 2021-03-26 NOTE — Therapy (Signed)
[facility] [address], [place], [address] Phone: [phone]   Fax:  [phone]  Physical Therapy Treatment  Patient Details  Name: Maria Cruz MRN: [id number] Date of Birth: 07-Jun-1956 Referring Provider (PT): Terrilee Croak, MD   Encounter Date: 03/26/2021   PT End of Session - 03/26/21 1501     Visit Number 6    Number of Visits 17    Date for PT Re-Evaluation 05/07/21    Authorization Type [employer] Managed    Authorization Time Period Cert [id number]    Progress Note Due on Visit 10    PT Start Time 1501    PT Stop Time 1541    PT Time Calculation (min) 40 min    Activity Tolerance Patient tolerated treatment well    Behavior During Therapy [facility] for tasks assessed/performed             Past Medical History:  Diagnosis Date   Allergy    Arthritis    Back pain with radiation    Calluse    Foot   Colon polyp    Diabetes mellitus without complication ([place]) Q000111Q   Type II    Fatty liver    GERD (gastroesophageal reflux disease)    History of total hip replacement, left 12/05/2019   [facility]   Hyperlipidemia    Hypertension    Lumbar radiculopathy    Dr. Mack Guise   Obesity    Plantar fasciitis, left    Vitamin D deficiency     Past Surgical History:  Procedure Laterality Date   ABDOMINAL HYSTERECTOMY  2012   ANTERIOR INTEROSSEOUS NERVE DECOMPRESSION Right 12/16/2020   Procedure: Right ulnar nerve release, elbow;  Surgeon: Hessie Knows, MD;  Location: [facility] ORS;  Service: Orthopedics;  Laterality: Right;   APPENDECTOMY  2012   BREAST BIOPSY Right 07/12/2012   Negative   BREAST BIOPSY Left 05/09/2017   Affirm Bx of two areas-FIBROADENOMA WITH COARSE CALCIFICATION. NEGATIVE FOR ATYPIA,COLLAPSED CYST AND FIBROADENOMATOID CHANGE   COLONOSCOPY  2009   Dr Allen Norris   COLONOSCOPY WITH PROPOFOL N/A 07/27/2017   Procedure: COLONOSCOPY WITH PROPOFOL;   Surgeon: Robert Bellow, MD;  Location: [facility] ENDOSCOPY;  Service: Endoscopy;  Laterality: N/A;   DILATION AND CURETTAGE OF UTERUS  2007   TUBAL LIGATION      There were no vitals filed for this visit.   Subjective Assessment - 03/26/21 1503     Subjective R arm is much better after last session.    Pertinent History R shoulder pain. Pain began since April 2021. Pt had L hip surgery and feels like lying on her R side might have bothered her R shoulder, arm, and elbow. Had R ulnar nerve release last month and participated in OT for surgical rehab. Pain as worsened since onset. Has not yet had PT for R shoulder. Pt also feels like her grip is off. Still has swelling at 4th and 5th digits.    Patient Stated Goals Decrease pain.    Currently in Pain? Yes    Pain Score 6                                        PT Education - 03/26/21 1536     Education Details ther-ex    Person(s) Educated Patient    Methods Explanation;Demonstration;Tactile cues;Verbal  cues    Comprehension Verbalized understanding;Returned demonstration            Objective   No latex allergies Blood pressure is controlled per pt. DM controlled per pt     Medbridge Access Code [email or web address]   Manual therapy    Seated STM R lateral arm to decrease tension   Supine STM posterior cervical paraspinal and R upper trap muscles to decrease tension.    Decreased pain in R arm     Therapeutic exercise   Seated manually resisted scapular retraction isometrics targeting lower trap muscles 10x3 with 5 second holds  Seated L cervical side bend to stretch R lateral neck 10x10 seconds for 2 sets. R pinky finger feeling more normal per pt.   No pain in R arm after exercises  Seated chin tucks 10x10 seconds  Seated manually resisted R shoulder  extension isometrics 10x5 seconds for 2 sets   Improved exercise technique, movement at target joints, use of target muscles after mod  verbal, visual, tactile cues.    Response to treatment Decreased RUE pain after session     Clinical impression Improved R UE symptoms with treatment to decrease R upper trap and scalene muscle tension as well as improving R scapular strength to help decrease stress to R UE nerves. R UE feels good per pt after session. Decreased pain to 4/10. Improved ability to raise R UE up observed. Pt will benefit from continued skilled physical therapy services to decrease pain, improve strength, ROM, and function.         PT Short Term Goals - 03/10/21 1306       PT SHORT TERM GOAL #1   Title Pt will be independent with her initial HEP to decrease pain, improve strength and ability to raise her R arm and reach more comfortably.    Baseline Pt has started her HEP (03/10/2021)    Time 3    Period Weeks    Status New    Target Date 04/02/21               PT Long Term Goals - 03/10/21 1308       PT LONG TERM GOAL #1   Title Pt will have a decrease in R arm pain to 5/10 or less at worst to promote ability to raise her R arm up, reach, as well as bend at her elbow more comfortably.    Baseline 9/10 R arm pain at most for the past 3 months (03/10/2021)    Time 8    Period Weeks    Status New    Target Date 05/07/21      PT LONG TERM GOAL #2   Title Pt will improve R shoulder flexion and abduction AROM by at least 20 degrees to promote ability to raise her arm up more comfortably.    Baseline R shoulder AROM flexion 113 degrees, abduction 92 degrees (03/10/2021)    Time 8    Period Weeks    Status New    Target Date 05/07/21      PT LONG TERM GOAL #3   Title Pt will improve her FOTO score by at least 10 points as a demonstration of improved function.    Baseline Shoulder FOTO 50 (03/10/2021)    Time 8    Period Weeks    Status New    Target Date 05/07/21  Plan - 03/26/21 1501     Clinical Impression Statement Improved R UE symptoms with treatment to  decrease R upper trap and scalene muscle tension as well as improving R scapular strength to help decrease stress to R UE nerves. R UE feels good per pt after session. Decreased pain to 4/10. Improved ability to raise R UE up observed. Pt will benefit from continued skilled physical therapy services to decrease pain, improve strength, ROM, and function.    Personal Factors and Comorbidities Age;Comorbidity 2;Fitness;Past/Current Experience;Time since onset of injury/illness/exacerbation    Comorbidities DM, HTN    Examination-Activity Limitations Lift;Reach Overhead;Toileting;Self Feeding;Carry;Sleep    Stability/Clinical Decision Making Evolving/Moderate complexity    Clinical Decision Making Low    Rehab Potential Fair    PT Frequency 2x / week    PT Duration 8 weeks    PT Treatment/Interventions Manual techniques;Neuromuscular re-education;Patient/family education;Therapeutic exercise;Therapeutic activities;Electrical Stimulation;Iontophoresis '4mg'$ /ml Dexamethasone;Dry needling    PT Next Visit Plan posture, scapular, shoulder anterior cervical strengthening, manual techniques, modalities PRN    PT Home Exercise Plan Medbridge Access Code [email or web address]    Consulted and Agree with Plan of Care Patient             Patient will benefit from skilled therapeutic intervention in order to improve the following deficits and impairments:  Pain, Improper body mechanics, Postural dysfunction, Impaired UE functional use, Decreased strength, Decreased range of motion  Visit Diagnosis: Cervicalgia  Radiculopathy, cervical region     Problem List Patient Active Problem List   Diagnosis Date Noted   Cubital tunnel syndrome, right 07/16/2020   Meralgia paraesthetica, left 07/16/2020   Ulnar neuropathy at elbow, right 07/16/2020   Numbness and tingling 06/10/2020   Encounter for long-term (current) use of high-risk medication 06/02/2020   LFT elevation 06/02/2020   Osteoarthritis of spine with  radiculopathy, cervical region 06/02/2020   Primary osteoarthritis involving multiple joints 06/02/2020   Rheumatoid arthritis of multiple sites with negative rheumatoid factor ([place]) 06/02/2020   Acute posthemorrhagic anemia 03/27/2020   Hip pain 03/27/2020   Osteoarthritis 03/27/2020   Sinus tachycardia 03/27/2020   History of total hip arthroplasty 12/18/2019   Proteinuria, unspecified 10/10/2019   Microcalcification of left breast on mammogram 05/03/2017   Herniated intervertebral disc of lumbar spine 09/02/2016   Spondylolisthesis of lumbar region 09/02/2016   Controlled type 2 diabetes mellitus with microalbuminuria, without long-term current use of insulin ([place]) 02/24/2015   Vitamin D deficiency 02/24/2015   Perennial allergic rhinitis 02/24/2015   Hypercalcemia 02/24/2015   GERD without esophagitis 02/24/2015   Hypertension, benign 02/24/2015   Controlled gout 02/24/2015   Fatty liver 02/24/2015   Callus of foot 02/24/2015   Primary osteoarthritis of right hip 02/24/2015   History of carpal tunnel syndrome 02/24/2015   Obesity (BMI 30-39.9) Q000111Q   Umbilical hernia without obstruction or gangrene 02/24/2015   Intermittent low back pain 02/24/2015   History of colonic polyps 02/24/2015   Dyslipidemia 02/24/2015   Joneen Boers PT, DPT   03/26/2021, 3:43 PM  [facility] [address] [place], [address] Phone: [phone]   Fax:  [phone]  Name: Maria Cruz MRN: [id number] Date of Birth: [birth year]

## 2021-03-31 ENCOUNTER — Ambulatory Visit: Payer: Managed Care, Other (non HMO)

## 2021-03-31 DIAGNOSIS — M5412 Radiculopathy, cervical region: Secondary | ICD-10-CM

## 2021-03-31 DIAGNOSIS — M542 Cervicalgia: Secondary | ICD-10-CM

## 2021-03-31 NOTE — Therapy (Signed)
[facility] [address] [place], [address] Phone: [phone]   Fax:  [phone]  Physical Therapy Treatment  Patient Details  Name: Maria Cruz MRN: [id number] Date of Birth: 1956-03-26 Referring Provider (PT): Terrilee Croak, MD   Encounter Date: 03/31/2021   PT End of Session - 03/31/21 1025     Visit Number 7    Number of Visits 17    Date for PT Re-Evaluation 05/07/21    Authorization Type [employer] Managed    Authorization Time Period Cert [id number]    Progress Note Due on Visit 10    PT Start Time 0932    PT Stop Time 1018    PT Time Calculation (min) 46 min    Activity Tolerance Patient tolerated treatment well;No increased pain    Behavior During Therapy [facility] for tasks assessed/performed             Past Medical History:  Diagnosis Date   Allergy    Arthritis    Back pain with radiation    Calluse    Foot   Colon polyp    Diabetes mellitus without complication ([place]) Q000111Q   Type II    Fatty liver    GERD (gastroesophageal reflux disease)    History of total hip replacement, left 12/05/2019   [facility]   Hyperlipidemia    Hypertension    Lumbar radiculopathy    Dr. Mack Guise   Obesity    Plantar fasciitis, left    Vitamin D deficiency     Past Surgical History:  Procedure Laterality Date   ABDOMINAL HYSTERECTOMY  2012   ANTERIOR INTEROSSEOUS NERVE DECOMPRESSION Right 12/16/2020   Procedure: Right ulnar nerve release, elbow;  Surgeon: Hessie Knows, MD;  Location: [facility] ORS;  Service: Orthopedics;  Laterality: Right;   APPENDECTOMY  2012   BREAST BIOPSY Right 07/12/2012   Negative   BREAST BIOPSY Left 05/09/2017   Affirm Bx of two areas-FIBROADENOMA WITH COARSE CALCIFICATION. NEGATIVE FOR ATYPIA,COLLAPSED CYST AND FIBROADENOMATOID CHANGE   COLONOSCOPY  2009   Dr Allen Norris   COLONOSCOPY WITH PROPOFOL N/A 07/27/2017   Procedure: COLONOSCOPY  WITH PROPOFOL;  Surgeon: Robert Bellow, MD;  Location: [facility] ENDOSCOPY;  Service: Endoscopy;  Laterality: N/A;   DILATION AND CURETTAGE OF UTERUS  2007   TUBAL LIGATION      There were no vitals filed for this visit.   Subjective Assessment - 03/31/21 0932     Subjective Pt reports R shoulder and arm pain is 5/10 NPS.    Pertinent History R shoulder pain. Pain began since April 2021. Pt had L hip surgery and feels like lying on her R side might have bothered her R shoulder, arm, and elbow. Had R ulnar nerve release last month and participated in OT for surgical rehab. Pain as worsened since onset. Has not yet had PT for R shoulder. Pt also feels like her grip is off. Still has swelling at 4th and 5th digits.    Patient Stated Goals Decrease pain.    Currently in Pain? Yes    Pain Score 5     Pain Location Shoulder    Pain Orientation Right    Pain Descriptors / Indicators Aching;Sore            Manual Therapy:   Seated STM to R upper delt, bicep, tricep. Palpable concordant trigger point noted on Lateral R arm. Hard to decipher lat bicep or  lat tricep. Reproducing pain with resisted elbow extension indicative of tricep trigger point. 10 min spent on trigger point release. Palpable reduction in size of trigger point post session.    Supine STM to suboccipitals and cervical paraspinals with focus on R side. 5 min.  R upper trap stretch with overpressure at R shoulder x5, 30 sec   There.ex:   Seated resisted GTB scap retractions: 2x10, VC's for scapular depression to reduce UT compensation.  Seated contract/relax R elbow flex/extension manual light resistance for reduced tension of palpable trigger point and analgesic affect. X10/motion.   Seated chin tucks: 2x12, good form/technique   Seated R GHJ abduction to 90 deg: 2x12, reports decreased R shoulder/arm symptoms during exercise. VC's to keep in pain free range.   Seated Shoulder flexion with supination and elbows extended:  2x12, in scap plane  Standing shoulder extension isometrics against door: 10x5 sec bilat for analgesic affect.       Pt reports 4/10 NPS post session, but reports significant reduction in tension in R upper arm.     PT Education - 03/31/21 0933     Education Details form/technique with exercise.    Person(s) Educated Patient    Methods Explanation;Demonstration;Tactile cues;Verbal cues    Comprehension Verbalized understanding;Returned demonstration              PT Short Term Goals - 03/10/21 1306       PT SHORT TERM GOAL #1   Title Pt will be independent with her initial HEP to decrease pain, improve strength and ability to raise her R arm and reach more comfortably.    Baseline Pt has started her HEP (03/10/2021)    Time 3    Period Weeks    Status New    Target Date 04/02/21               PT Long Term Goals - 03/10/21 1308       PT LONG TERM GOAL #1   Title Pt will have a decrease in R arm pain to 5/10 or less at worst to promote ability to raise her R arm up, reach, as well as bend at her elbow more comfortably.    Baseline 9/10 R arm pain at most for the past 3 months (03/10/2021)    Time 8    Period Weeks    Status New    Target Date 05/07/21      PT LONG TERM GOAL #2   Title Pt will improve R shoulder flexion and abduction AROM by at least 20 degrees to promote ability to raise her arm up more comfortably.    Baseline R shoulder AROM flexion 113 degrees, abduction 92 degrees (03/10/2021)    Time 8    Period Weeks    Status New    Target Date 05/07/21      PT LONG TERM GOAL #3   Title Pt will improve her FOTO score by at least 10 points as a demonstration of improved function.    Baseline Shoulder FOTO 50 (03/10/2021)    Time 8    Period Weeks    Status New    Target Date 05/07/21                   Plan - 03/31/21 1026     Clinical Impression Statement Continuing primary PT POC with focus on reduction of RUE pain. Pt tolerated manual  therapy and seated therex AROM exercises with reports of decreased pain and  tension in R shoulder and RUE. With shoulder flex and abduction, scapula is upwardly rotation and protracting. Will continue to benefit from skilled PT intervention to address remaining deficits.    Personal Factors and Comorbidities Age;Comorbidity 2;Fitness;Past/Current Experience;Time since onset of injury/illness/exacerbation    Comorbidities DM, HTN    Examination-Activity Limitations Lift;Reach Overhead;Toileting;Self Feeding;Carry;Sleep    Stability/Clinical Decision Making Evolving/Moderate complexity    Clinical Decision Making Low    Rehab Potential Fair    PT Frequency 2x / week    PT Duration 8 weeks    PT Treatment/Interventions Manual techniques;Neuromuscular re-education;Patient/family education;Therapeutic exercise;Therapeutic activities;Electrical Stimulation;Iontophoresis '4mg'$ /ml Dexamethasone;Dry needling    PT Next Visit Plan posture, scapular, shoulder anterior cervical strengthening, manual techniques, modalities PRN    PT Home Exercise Plan Medbridge Access Code [email or web address]    Consulted and Agree with Plan of Care Patient             Patient will benefit from skilled therapeutic intervention in order to improve the following deficits and impairments:  Pain, Improper body mechanics, Postural dysfunction, Impaired UE functional use, Decreased strength, Decreased range of motion  Visit Diagnosis: Cervicalgia  Radiculopathy, cervical region     Problem List Patient Active Problem List   Diagnosis Date Noted   Cubital tunnel syndrome, right 07/16/2020   Meralgia paraesthetica, left 07/16/2020   Ulnar neuropathy at elbow, right 07/16/2020   Numbness and tingling 06/10/2020   Encounter for long-term (current) use of high-risk medication 06/02/2020   LFT elevation 06/02/2020   Osteoarthritis of spine with radiculopathy, cervical region 06/02/2020   Primary osteoarthritis involving multiple  joints 06/02/2020   Rheumatoid arthritis of multiple sites with negative rheumatoid factor ([place]) 06/02/2020   Acute posthemorrhagic anemia 03/27/2020   Hip pain 03/27/2020   Osteoarthritis 03/27/2020   Sinus tachycardia 03/27/2020   History of total hip arthroplasty 12/18/2019   Proteinuria, unspecified 10/10/2019   Microcalcification of left breast on mammogram 05/03/2017   Herniated intervertebral disc of lumbar spine 09/02/2016   Spondylolisthesis of lumbar region 09/02/2016   Controlled type 2 diabetes mellitus with microalbuminuria, without long-term current use of insulin ([place]) 02/24/2015   Vitamin D deficiency 02/24/2015   Perennial allergic rhinitis 02/24/2015   Hypercalcemia 02/24/2015   GERD without esophagitis 02/24/2015   Hypertension, benign 02/24/2015   Controlled gout 02/24/2015   Fatty liver 02/24/2015   Callus of foot 02/24/2015   Primary osteoarthritis of right hip 02/24/2015   History of carpal tunnel syndrome 02/24/2015   Obesity (BMI 30-39.9) Q000111Q   Umbilical hernia without obstruction or gangrene 02/24/2015   Intermittent low back pain 02/24/2015   History of colonic polyps 02/24/2015   Dyslipidemia 02/24/2015    Salem Caster. Fairly IV, PT, DPT Physical Therapist- [facility]  03/31/2021, 10:31 AM  [facility] [address] [place], [address] Phone: [phone]   Fax:  [phone]  Name: Maria Cruz MRN: [id number] Date of Birth: Aug 18, 1955

## 2021-04-02 ENCOUNTER — Other Ambulatory Visit: Payer: Self-pay

## 2021-04-02 ENCOUNTER — Ambulatory Visit: Payer: Managed Care, Other (non HMO)

## 2021-04-02 DIAGNOSIS — M5412 Radiculopathy, cervical region: Secondary | ICD-10-CM

## 2021-04-02 DIAGNOSIS — M542 Cervicalgia: Secondary | ICD-10-CM

## 2021-04-02 DIAGNOSIS — M25511 Pain in right shoulder: Secondary | ICD-10-CM

## 2021-04-02 DIAGNOSIS — M6281 Muscle weakness (generalized): Secondary | ICD-10-CM

## 2021-04-02 NOTE — Therapy (Signed)
[place] [facility] [address], [place], [address] Phone: [phone]   Fax:  [phone]  Physical Therapy Treatment  Patient Details  Name: Maria Cruz MRN: [id number] Date of Birth: Feb 25, 1956 Referring Provider (PT): Terrilee Croak, MD   Encounter Date: 04/02/2021   PT End of Session - 04/02/21 0937     Visit Number 8    Number of Visits 17    Date for PT Re-Evaluation 05/07/21    Authorization Type [employer] Managed    Authorization Time Period Cert [id number]    Progress Note Due on Visit 10    PT Start Time 0935    PT Stop Time 1015    PT Time Calculation (min) 40 min    Activity Tolerance Patient tolerated treatment well;No increased pain    Behavior During Therapy [facility] for tasks assessed/performed             Past Medical History:  Diagnosis Date   Allergy    Arthritis    Back pain with radiation    Calluse    Foot   Colon polyp    Diabetes mellitus without complication ([place]) Q000111Q   Type II    Fatty liver    GERD (gastroesophageal reflux disease)    History of total hip replacement, left 12/05/2019   [facility]   Hyperlipidemia    Hypertension    Lumbar radiculopathy    Dr. Mack Guise   Obesity    Plantar fasciitis, left    Vitamin D deficiency     Past Surgical History:  Procedure Laterality Date   ABDOMINAL HYSTERECTOMY  2012   ANTERIOR INTEROSSEOUS NERVE DECOMPRESSION Right 12/16/2020   Procedure: Right ulnar nerve release, elbow;  Surgeon: Hessie Knows, MD;  Location: [facility] ORS;  Service: Orthopedics;  Laterality: Right;   APPENDECTOMY  2012   BREAST BIOPSY Right 07/12/2012   Negative   BREAST BIOPSY Left 05/09/2017   Affirm Bx of two areas-FIBROADENOMA WITH COARSE CALCIFICATION. NEGATIVE FOR ATYPIA,COLLAPSED CYST AND FIBROADENOMATOID CHANGE   COLONOSCOPY  2009   Dr Allen Norris   COLONOSCOPY WITH PROPOFOL N/A 07/27/2017   Procedure: COLONOSCOPY  WITH PROPOFOL;  Surgeon: Robert Bellow, MD;  Location: [facility] ENDOSCOPY;  Service: Endoscopy;  Laterality: N/A;   DILATION AND CURETTAGE OF UTERUS  2007   TUBAL LIGATION      There were no vitals filed for this visit.   Subjective Assessment - 04/02/21 0936     Subjective Pt reports 4/10 pain in R shoulder. Late to session due to traffic.    Pertinent History R shoulder pain. Pain began since April 2021. Pt had L hip surgery and feels like lying on her R side might have bothered her R shoulder, arm, and elbow. Had R ulnar nerve release last month and participated in OT for surgical rehab. Pain as worsened since onset. Has not yet had PT for R shoulder. Pt also feels like her grip is off. Still has swelling at 4th and 5th digits.    Patient Stated Goals Decrease pain.    Currently in Pain? Yes    Pain Score 4     Pain Location Shoulder    Pain Orientation Right    Pain Descriptors / Indicators Aching;Sore    Pain Type Chronic pain    Pain Onset More than a month ago            Manual Therapy: Seated, 10 min  Seated  STM to R upper delt, bicep. Palpable concordant trigger point noted on Lateral R arm, appears to be lateral bicep.  Seated contract/relax resisted R elbow extension for reciprocal inhibition to biceps. Reports decreased ant R shoulder pain. X10 varying ranges of motion.   Passive R elbow extension + shoulder flexion(~60-90 degs shoulder flexion to pt's tolerance) to stretch ant shoulder flexor group: x10, 20-30 sec holds      There.ex:   Seated exercises:   R shoulder abd to 90 deg, 2x10  B shoulder flexion with elbows extended and forearms supinated: 2x10   B shoulder 2# DB shoulder flexion supinated grip , 2x10   Seated chin tucks: 2x12, good form/technique.  Standing exercises:   4# DB bicep curls, 2x10 GTB, 2x12, scap retractions, good form/technique.      PT Education - 04/02/21 [id number]     Education Details form/technique with exercise.    Person(s)  Educated Patient    Methods Explanation;Demonstration;Tactile cues;Verbal cues    Comprehension Verbalized understanding;Returned demonstration              PT Short Term Goals - 03/10/21 1306       PT SHORT TERM GOAL #1   Title Pt will be independent with her initial HEP to decrease pain, improve strength and ability to raise her R arm and reach more comfortably.    Baseline Pt has started her HEP (03/10/2021)    Time 3    Period Weeks    Status New    Target Date 04/02/21               PT Long Term Goals - 03/10/21 1308       PT LONG TERM GOAL #1   Title Pt will have a decrease in R arm pain to 5/10 or less at worst to promote ability to raise her R arm up, reach, as well as bend at her elbow more comfortably.    Baseline 9/10 R arm pain at most for the past 3 months (03/10/2021)    Time 8    Period Weeks    Status New    Target Date 05/07/21      PT LONG TERM GOAL #2   Title Pt will improve R shoulder flexion and abduction AROM by at least 20 degrees to promote ability to raise her arm up more comfortably.    Baseline R shoulder AROM flexion 113 degrees, abduction 92 degrees (03/10/2021)    Time 8    Period Weeks    Status New    Target Date 05/07/21      PT LONG TERM GOAL #3   Title Pt will improve her FOTO score by at least 10 points as a demonstration of improved function.    Baseline Shoulder FOTO 50 (03/10/2021)    Time 8    Period Weeks    Status New    Target Date 05/07/21                   Plan - 04/02/21 1013     Clinical Impression Statement Continuing to focus on reducing RUE pain with manual techniques and therex. Pt responded well to treatment with reports of reduction of R shoulder pain to 3/10 NPS. Progressed this session in shoulder strengthening with no adverse effects or increased pain with added resistance. Pt will continue to benefit from skilled PT treatment to further reduce pain and improve strength/function.    Personal Factors  and Comorbidities Age;Comorbidity 2;Fitness;Past/Current Experience;Time  since onset of injury/illness/exacerbation    Comorbidities DM, HTN    Examination-Activity Limitations Lift;Reach Overhead;Toileting;Self Feeding;Carry;Sleep    Stability/Clinical Decision Making Evolving/Moderate complexity    Rehab Potential Fair    PT Frequency 2x / week    PT Duration 8 weeks    PT Treatment/Interventions Manual techniques;Neuromuscular re-education;Patient/family education;Therapeutic exercise;Therapeutic activities;Electrical Stimulation;Iontophoresis '4mg'$ /ml Dexamethasone;Dry needling    PT Next Visit Plan posture, scapular, shoulder anterior cervical strengthening, manual techniques, modalities PRN    PT Home Exercise Plan Medbridge Access Code [email or web address]    Consulted and Agree with Plan of Care Patient             Patient will benefit from skilled therapeutic intervention in order to improve the following deficits and impairments:  Pain, Improper body mechanics, Postural dysfunction, Impaired UE functional use, Decreased strength, Decreased range of motion  Visit Diagnosis: Cervicalgia  Radiculopathy, cervical region  Pain in joint of right shoulder  Muscle weakness (generalized)     Problem List Patient Active Problem List   Diagnosis Date Noted   Cubital tunnel syndrome, right 07/16/2020   Meralgia paraesthetica, left 07/16/2020   Ulnar neuropathy at elbow, right 07/16/2020   Numbness and tingling 06/10/2020   Encounter for long-term (current) use of high-risk medication 06/02/2020   LFT elevation 06/02/2020   Osteoarthritis of spine with radiculopathy, cervical region 06/02/2020   Primary osteoarthritis involving multiple joints 06/02/2020   Rheumatoid arthritis of multiple sites with negative rheumatoid factor ([place]) 06/02/2020   Acute posthemorrhagic anemia 03/27/2020   Hip pain 03/27/2020   Osteoarthritis 03/27/2020   Sinus tachycardia 03/27/2020   History of total hip  arthroplasty 12/18/2019   Proteinuria, unspecified 10/10/2019   Microcalcification of left breast on mammogram 05/03/2017   Herniated intervertebral disc of lumbar spine 09/02/2016   Spondylolisthesis of lumbar region 09/02/2016   Controlled type 2 diabetes mellitus with microalbuminuria, without long-term current use of insulin ([place]) 02/24/2015   Vitamin D deficiency 02/24/2015   Perennial allergic rhinitis 02/24/2015   Hypercalcemia 02/24/2015   GERD without esophagitis 02/24/2015   Hypertension, benign 02/24/2015   Controlled gout 02/24/2015   Fatty liver 02/24/2015   Callus of foot 02/24/2015   Primary osteoarthritis of right hip 02/24/2015   History of carpal tunnel syndrome 02/24/2015   Obesity (BMI 30-39.9) Q000111Q   Umbilical hernia without obstruction or gangrene 02/24/2015   Intermittent low back pain 02/24/2015   History of colonic polyps 02/24/2015   Dyslipidemia 02/24/2015    Salem Caster. Fairly IV, PT, DPT Physical Therapist- [facility]  04/02/2021, 11:09 AM  [place] [facility] [address], [place], [address] Phone: [phone]   Fax:  [phone]  Name: Maria Cruz MRN: [id number] Date of Birth: 29-Sep-1955

## 2021-04-05 ENCOUNTER — Other Ambulatory Visit: Payer: Self-pay | Admitting: Family Medicine

## 2021-04-05 DIAGNOSIS — J3089 Other allergic rhinitis: Secondary | ICD-10-CM

## 2021-04-05 NOTE — Telephone Encounter (Signed)
Requested Prescriptions  Pending Prescriptions Disp Refills  . fluticasone (FLONASE) 50 MCG/ACT nasal spray [Pharmacy Med Name: FLUTICASONE 50MCG NASAL SP (120) RX] 48 g 0    Sig: SHAKE LIQUID AND USE 2 SPRAYS IN EACH NOSTRIL DAILY     Ear, Nose, and Throat: Nasal Preparations - Corticosteroids Passed - 04/05/2021 10:17 AM      Passed - Valid encounter within last 12 months    Recent Outpatient Visits          1 week ago Viral URI   [facility] Rory Percy M, DO   2 months ago Diabetes mellitus type 2 in obese [facility])   [facility] Steele Sizer, MD   6 months ago Diabetes mellitus type 2 in obese [facility])   [facility] Steele Sizer, MD   9 months ago Diabetes mellitus type 2 in obese [facility])   [facility] Steele Sizer, MD   1 year ago Diabetes mellitus type 2 in obese [facility])   [facility] Steele Sizer, MD      Future Appointments            In 1 month Ancil Boozer, Drue Stager, MD [facility], [facility]

## 2021-04-07 ENCOUNTER — Ambulatory Visit: Payer: Managed Care, Other (non HMO)

## 2021-04-07 DIAGNOSIS — M25511 Pain in right shoulder: Secondary | ICD-10-CM

## 2021-04-07 DIAGNOSIS — M542 Cervicalgia: Secondary | ICD-10-CM | POA: Diagnosis not present

## 2021-04-07 DIAGNOSIS — M5412 Radiculopathy, cervical region: Secondary | ICD-10-CM

## 2021-04-07 DIAGNOSIS — M6281 Muscle weakness (generalized): Secondary | ICD-10-CM

## 2021-04-07 NOTE — Therapy (Signed)
[place] [facility] [address] [place], [address] Phone: [phone]   Fax:  [phone]  Physical Therapy Treatment/Reassessment  Patient Details  Name: Maria Cruz MRN: [id number] Date of Birth: 04/19/1956 Referring Provider (PT): Terrilee Croak, MD   Encounter Date: 04/07/2021   PT End of Session - 04/07/21 0959     Visit Number 9    Number of Visits 17    Date for PT Re-Evaluation 05/07/21    Authorization Type [employer] Managed    Authorization Time Period Cert [id number]    Progress Note Due on Visit 10    PT Start Time 0935    PT Stop Time 1014    PT Time Calculation (min) 39 min    Activity Tolerance Patient tolerated treatment well;No increased pain    Behavior During Therapy [facility] for tasks assessed/performed             Past Medical History:  Diagnosis Date   Allergy    Arthritis    Back pain with radiation    Calluse    Foot   Colon polyp    Diabetes mellitus without complication ([place]) Q000111Q   Type II    Fatty liver    GERD (gastroesophageal reflux disease)    History of total hip replacement, left 12/05/2019   [facility]   Hyperlipidemia    Hypertension    Lumbar radiculopathy    Dr. Mack Guise   Obesity    Plantar fasciitis, left    Vitamin D deficiency     Past Surgical History:  Procedure Laterality Date   ABDOMINAL HYSTERECTOMY  2012   ANTERIOR INTEROSSEOUS NERVE DECOMPRESSION Right 12/16/2020   Procedure: Right ulnar nerve release, elbow;  Surgeon: Hessie Knows, MD;  Location: [facility] ORS;  Service: Orthopedics;  Laterality: Right;   APPENDECTOMY  2012   BREAST BIOPSY Right 07/12/2012   Negative   BREAST BIOPSY Left 05/09/2017   Affirm Bx of two areas-FIBROADENOMA WITH COARSE CALCIFICATION. NEGATIVE FOR ATYPIA,COLLAPSED CYST AND FIBROADENOMATOID CHANGE   COLONOSCOPY  2009   Dr Allen Norris   COLONOSCOPY WITH PROPOFOL N/A 07/27/2017    Procedure: COLONOSCOPY WITH PROPOFOL;  Surgeon: Robert Bellow, MD;  Location: [facility] ENDOSCOPY;  Service: Endoscopy;  Laterality: N/A;   DILATION AND CURETTAGE OF UTERUS  2007   TUBAL LIGATION      There were no vitals filed for this visit.       [facility] PT Assessment - 04/07/21 0001       Assessment   Medical Diagnosis Primary OA involving multiple joints (shoulder)    Referring Provider (PT) Terrilee Croak, MD    Onset Date/Surgical Date 02/19/21   Date PT referral signed   Hand Dominance Right      Observation/Other Assessments   Focus on Therapeutic Outcomes (FOTO)  55   50at evaluation     AROM   Right Shoulder Flexion 110 Degrees    (113 scaption); 113 flexion at eval   Right Shoulder ABduction 134 Degrees   (mod pain at anterior deltoid); 92 at evaluation;   Left Shoulder Flexion 140 Degrees    Left Shoulder ABduction 144 Degrees    Cervical Extension 38    Cervical - Right Side Bend 17    Lt neck pain   Cervical - Left Side Bend 28      Strength   Right Shoulder Flexion 4-/5    anterior shoulder pain   Right Shoulder  ABduction 5/5   (4/5 at evaluation)   Right Shoulder Internal Rotation 4+/5    Right Shoulder External Rotation 4+/5    Left Shoulder Flexion 4/5   anterior shoulder pain   Left Shoulder ABduction 5/5    Left Shoulder Internal Rotation 4/5   no pain   Left Shoulder External Rotation 4+/5    Right Hand Grip (lbs) 40, 40    53 in July 2022   Left Hand Grip (lbs) 45, 45             INTERVENTION THIS DATE:  -scaption table slides 10x10sec, 15x3secH  -GHJ traction at 30 degrees abduction (20lb on floor cable) 3x30sec bilat  -Standing bilat shoulder abduction 1x12 at 2lb FW -Table pushup 1x15 (max table height) feet 12 inches back -standing scaption bilat 1lb FW  -standing row down 20lb, 1x12 (tactile cues for thoracic extension)           PT Short Term Goals - 04/07/21 1001       PT SHORT TERM GOAL #1   Title Pt will be independent  with her initial HEP to decrease pain, improve strength and ability to raise her R arm and reach more comfortably.    Baseline Pt has started her HEP (03/10/2021)    Time 3    Period Weeks    Status Achieved    Target Date 04/02/21               PT Long Term Goals - 04/07/21 1002       PT LONG TERM GOAL #1   Title Pt will have a decrease in R arm pain to 5/10 or less at worst to promote ability to raise her R arm up, reach, as well as bend at her elbow more comfortably.    Baseline 9/10 R arm pain at most for the past 3 months (03/10/2021); 8/30: 8/10 at worst    Time 8    Period Weeks    Status On-going    Target Date 05/07/21      PT LONG TERM GOAL #2   Title Pt will improve R shoulder flexion and abduction AROM by at least 20 degrees to promote ability to raise her arm up more comfortably.    Baseline R shoulder AROM flexion 113 degrees, abduction 92 degrees (03/10/2021); 8/30: Achieved in abduction, flexion unchanged    Time 8    Period Weeks    Status On-going    Target Date 05/07/21      PT LONG TERM GOAL #3   Title Pt will improve her FOTO score by at least 10 points as a demonstration of improved function.    Baseline Shoulder FOTO 50 (03/10/2021); 04/07/21: 55    Time 8    Period Weeks    Status On-going    Target Date 05/07/21                   Plan - 04/07/21 0959     Clinical Impression Statement Reassessment this dat ein preparation for 10th visit next time. Pt showing improvements in ROM, pain report, and FOTO score, but remains limited in shoulder strenght impairments    Personal Factors and Comorbidities Age;Comorbidity 2;Fitness;Past/Current Experience;Time since onset of injury/illness/exacerbation    Comorbidities DM, HTN    Examination-Activity Limitations Lift;Reach Overhead;Toileting;Self Feeding;Carry;Sleep    Stability/Clinical Decision Making Evolving/Moderate complexity    Clinical Decision Making Low    Rehab Potential Fair    PT  Frequency 2x /  week    PT Duration 8 weeks    PT Treatment/Interventions Manual techniques;Neuromuscular re-education;Patient/family education;Therapeutic exercise;Therapeutic activities;Electrical Stimulation;Iontophoresis '4mg'$ /ml Dexamethasone;Dry needling    PT Next Visit Plan posture, scapular, shoulder anterior cervical strengthening, manual techniques, modalities PRN    PT Home Exercise Plan Medbridge Access Code [email or web address], no updates this date    Consulted and Agree with Plan of Care Patient             Patient will benefit from skilled therapeutic intervention in order to improve the following deficits and impairments:  Pain, Improper body mechanics, Postural dysfunction, Impaired UE functional use, Decreased strength, Decreased range of motion  Visit Diagnosis: Cervicalgia  Radiculopathy, cervical region  Pain in joint of right shoulder  Muscle weakness (generalized)     Problem List Patient Active Problem List   Diagnosis Date Noted   Cubital tunnel syndrome, right 07/16/2020   Meralgia paraesthetica, left 07/16/2020   Ulnar neuropathy at elbow, right 07/16/2020   Numbness and tingling 06/10/2020   Encounter for long-term (current) use of high-risk medication 06/02/2020   LFT elevation 06/02/2020   Osteoarthritis of spine with radiculopathy, cervical region 06/02/2020   Primary osteoarthritis involving multiple joints 06/02/2020   Rheumatoid arthritis of multiple sites with negative rheumatoid factor ([place]) 06/02/2020   Acute posthemorrhagic anemia 03/27/2020   Hip pain 03/27/2020   Osteoarthritis 03/27/2020   Sinus tachycardia 03/27/2020   History of total hip arthroplasty 12/18/2019   Proteinuria, unspecified 10/10/2019   Microcalcification of left breast on mammogram 05/03/2017   Herniated intervertebral disc of lumbar spine 09/02/2016   Spondylolisthesis of lumbar region 09/02/2016   Controlled type 2 diabetes mellitus with microalbuminuria, without  long-term current use of insulin ([place]) 02/24/2015   Vitamin D deficiency 02/24/2015   Perennial allergic rhinitis 02/24/2015   Hypercalcemia 02/24/2015   GERD without esophagitis 02/24/2015   Hypertension, benign 02/24/2015   Controlled gout 02/24/2015   Fatty liver 02/24/2015   Callus of foot 02/24/2015   Primary osteoarthritis of right hip 02/24/2015   History of carpal tunnel syndrome 02/24/2015   Obesity (BMI 30-39.9) Q000111Q   Umbilical hernia without obstruction or gangrene 02/24/2015   Intermittent low back pain 02/24/2015   History of colonic polyps 02/24/2015   Dyslipidemia 02/24/2015   10:18 AM, 04/07/21 Etta Grandchild, PT, DPT Physical Therapist - [place] [phone] (Office)   Raegan Sipp C 04/07/2021, 10:17 AM  [place] [facility] [address] [place], [address] Phone: [phone]   Fax:  [phone]  Name: Maria Cruz MRN: [id number] Date of Birth: 1956/03/18

## 2021-04-09 ENCOUNTER — Ambulatory Visit: Payer: Managed Care, Other (non HMO) | Attending: Orthopedic Surgery

## 2021-04-09 DIAGNOSIS — M25511 Pain in right shoulder: Secondary | ICD-10-CM | POA: Insufficient documentation

## 2021-04-09 DIAGNOSIS — M6281 Muscle weakness (generalized): Secondary | ICD-10-CM | POA: Diagnosis present

## 2021-04-09 DIAGNOSIS — M542 Cervicalgia: Secondary | ICD-10-CM | POA: Insufficient documentation

## 2021-04-09 DIAGNOSIS — M5412 Radiculopathy, cervical region: Secondary | ICD-10-CM | POA: Diagnosis present

## 2021-04-09 NOTE — Therapy (Signed)
[facility] [address] [place], [address] Phone: [phone]   Fax:  [phone]  Physical Therapy Treatment/Progress note Reporting period 03/10/21-04/09/21  Patient Details  Name: Maria Cruz MRN: [id number] Date of Birth: 09/28/1955 Referring Provider (PT): Terrilee Croak, MD   Encounter Date: 04/09/2021   PT End of Session - 04/09/21 1028     Visit Number 10    Number of Visits 17    Date for PT Re-Evaluation 05/07/21    Authorization Type [employer] Managed    Authorization Time Period Cert [id number]    Progress Note Due on Visit 20    PT Start Time 1020    PT Stop Time 1058    PT Time Calculation (min) 38 min    Activity Tolerance Patient tolerated treatment well;No increased pain    Behavior During Therapy [facility] for tasks assessed/performed             Past Medical History:  Diagnosis Date   Allergy    Arthritis    Back pain with radiation    Calluse    Foot   Colon polyp    Diabetes mellitus without complication ([place]) Q000111Q   Type II    Fatty liver    GERD (gastroesophageal reflux disease)    History of total hip replacement, left 12/05/2019   [facility]   Hyperlipidemia    Hypertension    Lumbar radiculopathy    Dr. Mack Guise   Obesity    Plantar fasciitis, left    Vitamin D deficiency     Past Surgical History:  Procedure Laterality Date   ABDOMINAL HYSTERECTOMY  2012   ANTERIOR INTEROSSEOUS NERVE DECOMPRESSION Right 12/16/2020   Procedure: Right ulnar nerve release, elbow;  Surgeon: Hessie Knows, MD;  Location: [facility] ORS;  Service: Orthopedics;  Laterality: Right;   APPENDECTOMY  2012   BREAST BIOPSY Right 07/12/2012   Negative   BREAST BIOPSY Left 05/09/2017   Affirm Bx of two areas-FIBROADENOMA WITH COARSE CALCIFICATION. NEGATIVE FOR ATYPIA,COLLAPSED CYST AND FIBROADENOMATOID CHANGE   COLONOSCOPY  2009   Dr Allen Norris   COLONOSCOPY WITH  PROPOFOL N/A 07/27/2017   Procedure: COLONOSCOPY WITH PROPOFOL;  Surgeon: Robert Bellow, MD;  Location: [facility] ENDOSCOPY;  Service: Endoscopy;  Laterality: N/A;   DILATION AND CURETTAGE OF UTERUS  2007   TUBAL LIGATION      There were no vitals filed for this visit.   Subjective Assessment - 04/09/21 1021     Subjective Pt doing well today, says she really felt good after last session, improved pain and able to move her arm better. Pt feels today at her 10th visit that she is makeing good progress toward getting better and moving better.    Pertinent History R shoulder pain. Pain began since April 2021. Pt had L hip surgery and feels like lying on her R side might have bothered her R shoulder, arm, and elbow. Had R ulnar nerve release last month and participated in OT for surgical rehab. Pain as worsened since onset. Has not yet had PT for R shoulder. Pt also feels like her grip is off. Still has swelling at 4th and 5th digits.    Currently in Pain? Yes    Pain Score 3     Pain Location --   Right anterior deltoid pain            INTERVENTION:  -aa/rom bue 5 minutes on Nustep, mina  for mount/dismount, setup: seat 9, arms 11, thoracic towel roll   -Scaption P/ROM BUE stretch on chest press 3x30sec     -MFR trigger point release Rt Anterior and middle deltoid x 4 minutes, followed by IASTM right, middle deltoid and deltoid insertion site- skin barrier applied for each.   -standing BUE shoulder ABDCT 1lbFW 1x15 bilat, mirror for feedback on stopping at 90 degrees -standing Rt shoulder flexion 2lb 1x10 -standing Lt shoulder flexion 2lb 1x10 -standing BUE supinated ER RedTB 1x15 (blue obviously too resistant) -standing row down 20lb, 1x12 (tactile cues for thoracic extension)  -seated chest press 10lb 1x15 (starting position in minimal shoulder extension only)  -standing BUE shoulder ABDCT 1lbFW 1x15 bilat, mirror for feedback on stopping at 90 degrees -standing Rt shoulder  flexion 2lb 1x10 -standing Lt shoulder flexion 2lb 1x10 -standing BUE supinated ER RedTB 1x15 (blue obviously too resistant) -standing row down 20lb, 1x12 (tactile cues for thoracic extension)  -seated chest press 10lb 1x15 (starting position in minimal shoulder extension only     PT Short Term Goals - 04/09/21 1023       PT SHORT TERM GOAL #1   Title Pt will be independent with her initial HEP to decrease pain, improve strength and ability to raise her R arm and reach more comfortably.    Baseline Pt has started her HEP (03/10/2021)    Time 3    Period Weeks    Status Achieved    Target Date 04/02/21               PT Long Term Goals - 04/09/21 1023       PT LONG TERM GOAL #1   Title Pt will have a decrease in R arm pain to 5/10 or less at worst to promote ability to raise her R arm up, reach, as well as bend at her elbow more comfortably.    Baseline 9/10 R arm pain at most for the past 3 months (03/10/2021); 8/30: 8/10 at worst; 04/09/21 6/10    Time 8    Period Weeks    Status On-going    Target Date 05/07/21      PT LONG TERM GOAL #2   Title Pt will improve R shoulder flexion and abduction AROM by at least 20 degrees to promote ability to raise her arm up more comfortably.    Baseline R shoulder AROM flexion 113 degrees, abduction 92 degrees (03/10/2021); 8/30: Achieved in abduction, flexion unchanged    Time 8    Period Weeks    Status On-going    Target Date 05/07/21      PT LONG TERM GOAL #3   Title Pt will improve her FOTO score by at least 10 points as a demonstration of improved function.    Baseline Shoulder FOTO 50 (03/10/2021); 04/07/21: 55    Time 8    Period Weeks    Status On-going    Target Date 05/07/21                   Plan - 04/09/21 1029     Clinical Impression Statement Progress report this date, excellent progress toward treatment goals. Pt conitnues to tolerate session well, have reduced pain and build strength back.Pt will continue to  benefit from skilled PT intervention to restore to PLOF.    Personal Factors and Comorbidities Age;Comorbidity 2;Fitness;Past/Current Experience;Time since onset of injury/illness/exacerbation    Comorbidities DM, HTN    Examination-Activity Limitations Lift;Reach Overhead;Toileting;Self Feeding;Carry;Sleep  Stability/Clinical Decision Making Evolving/Moderate complexity    Clinical Decision Making Low    Rehab Potential Poor    PT Frequency 2x / week    PT Duration 8 weeks    PT Treatment/Interventions Manual techniques;Neuromuscular re-education;Patient/family education;Therapeutic exercise;Therapeutic activities;Electrical Stimulation;Iontophoresis '4mg'$ /ml Dexamethasone;Dry needling    PT Next Visit Plan posture, scapular, shoulder anterior cervical strengthening, manual techniques, modalities PRN    PT Home Exercise Plan Medbridge Access Code [email or web address], no updates this date    Consulted and Agree with Plan of Care Patient             Patient will benefit from skilled therapeutic intervention in order to improve the following deficits and impairments:  Pain, Improper body mechanics, Postural dysfunction, Impaired UE functional use, Decreased strength, Decreased range of motion  Visit Diagnosis: Cervicalgia  Radiculopathy, cervical region  Pain in joint of right shoulder  Muscle weakness (generalized)     Problem List Patient Active Problem List   Diagnosis Date Noted   Cubital tunnel syndrome, right 07/16/2020   Meralgia paraesthetica, left 07/16/2020   Ulnar neuropathy at elbow, right 07/16/2020   Numbness and tingling 06/10/2020   Encounter for long-term (current) use of high-risk medication 06/02/2020   LFT elevation 06/02/2020   Osteoarthritis of spine with radiculopathy, cervical region 06/02/2020   Primary osteoarthritis involving multiple joints 06/02/2020   Rheumatoid arthritis of multiple sites with negative rheumatoid factor ([place]) 06/02/2020   Acute  posthemorrhagic anemia 03/27/2020   Hip pain 03/27/2020   Osteoarthritis 03/27/2020   Sinus tachycardia 03/27/2020   History of total hip arthroplasty 12/18/2019   Proteinuria, unspecified 10/10/2019   Microcalcification of left breast on mammogram 05/03/2017   Herniated intervertebral disc of lumbar spine 09/02/2016   Spondylolisthesis of lumbar region 09/02/2016   Controlled type 2 diabetes mellitus with microalbuminuria, without long-term current use of insulin ([place]) 02/24/2015   Vitamin D deficiency 02/24/2015   Perennial allergic rhinitis 02/24/2015   Hypercalcemia 02/24/2015   GERD without esophagitis 02/24/2015   Hypertension, benign 02/24/2015   Controlled gout 02/24/2015   Fatty liver 02/24/2015   Callus of foot 02/24/2015   Primary osteoarthritis of right hip 02/24/2015   History of carpal tunnel syndrome 02/24/2015   Obesity (BMI 30-39.9) Q000111Q   Umbilical hernia without obstruction or gangrene 02/24/2015   Intermittent low back pain 02/24/2015   History of colonic polyps 02/24/2015   Dyslipidemia 02/24/2015   10:58 AM, 04/09/21 Etta Grandchild, PT, DPT Physical Therapist - [place] [phone] (Office)   Ela Moffat C 04/09/2021, 10:51 AM  [facility] [address] [place], [address] Phone: [phone]   Fax:  [phone]  Name: Maria Cruz MRN: [id number] Date of Birth: 06-22-1956

## 2021-04-14 ENCOUNTER — Ambulatory Visit: Payer: Managed Care, Other (non HMO)

## 2021-04-14 DIAGNOSIS — M542 Cervicalgia: Secondary | ICD-10-CM

## 2021-04-14 DIAGNOSIS — M5412 Radiculopathy, cervical region: Secondary | ICD-10-CM

## 2021-04-14 NOTE — Patient Instructions (Signed)
Access Code: [email or web address] URL: [email or web address] Date: 04/14/2021 Prepared by: Joneen Boers  Exercises Seated Cervical Retraction - 1 x daily - 7 x weekly - 3 sets - 10 reps - 5 seconds hold Seated Scapular Retraction - 1 x daily - 7 x weekly - 3 sets - 10 reps - 5 seconds hold First Rib Mobilization with Strap - 3 x daily - 7 x weekly - 1 sets - 3 reps - 30 seconds hold Shoulder Abduction with Dumbbells - Palms Down - 1 x daily - 7 x weekly - 2 sets - 15 reps

## 2021-04-14 NOTE — Therapy (Signed)
[facility] [address] [place], [address] Phone: [phone]   Fax:  [phone]  Physical Therapy Treatment  Patient Details  Name: Maria Cruz MRN: [id number] Date of Birth: [birth year] Referring Provider (PT): Terrilee Croak, MD   Encounter Date: 04/14/2021   PT End of Session - 04/14/21 1016     Visit Number 11    Number of Visits 17    Date for PT Re-Evaluation 05/07/21    Authorization Type [employer] Managed    Authorization Time Period Cert [id number]    Progress Note Due on Visit 20    PT Start Time 1016    PT Stop Time 1100    PT Time Calculation (min) 44 min    Activity Tolerance Patient tolerated treatment well;No increased pain    Behavior During Therapy [facility] for tasks assessed/performed             Past Medical History:  Diagnosis Date   Allergy    Arthritis    Back pain with radiation    Calluse    Foot   Colon polyp    Diabetes mellitus without complication ([place]) Q000111Q   Type II    Fatty liver    GERD (gastroesophageal reflux disease)    History of total hip replacement, left 12/05/2019   [facility]   Hyperlipidemia    Hypertension    Lumbar radiculopathy    Dr. Mack Guise   Obesity    Plantar fasciitis, left    Vitamin D deficiency     Past Surgical History:  Procedure Laterality Date   ABDOMINAL HYSTERECTOMY  2012   ANTERIOR INTEROSSEOUS NERVE DECOMPRESSION Right 12/16/2020   Procedure: Right ulnar nerve release, elbow;  Surgeon: Hessie Knows, MD;  Location: [facility] ORS;  Service: Orthopedics;  Laterality: Right;   APPENDECTOMY  2012   BREAST BIOPSY Right 07/12/2012   Negative   BREAST BIOPSY Left 05/09/2017   Affirm Bx of two areas-FIBROADENOMA WITH COARSE CALCIFICATION. NEGATIVE FOR ATYPIA,COLLAPSED CYST AND FIBROADENOMATOID CHANGE   COLONOSCOPY  2009   Dr Allen Norris   COLONOSCOPY WITH PROPOFOL N/A 07/27/2017   Procedure: COLONOSCOPY  WITH PROPOFOL;  Surgeon: Robert Bellow, MD;  Location: [facility] ENDOSCOPY;  Service: Endoscopy;  Laterality: N/A;   DILATION AND CURETTAGE OF UTERUS  2007   TUBAL LIGATION      There were no vitals filed for this visit.   Subjective Assessment - 04/14/21 1018     Subjective R shoulder is doing better. 3/10 currently. Has been doing her putty exercises.    Pertinent History R shoulder pain. Pain began since April 2021. Pt had L hip surgery and feels like lying on her R side might have bothered her R shoulder, arm, and elbow. Had R ulnar nerve release last month and participated in OT for surgical rehab. Pain as worsened since onset. Has not yet had PT for R shoulder. Pt also feels like her grip is off. Still has swelling at 4th and 5th digits.    Currently in Pain? Yes    Pain Score 3                                        PT Education - 04/14/21 1024     Education Details ther-ex    Person(s) Educated Patient    Methods Explanation;Demonstration;Tactile cues;Verbal cues  Comprehension Returned demonstration;Verbalized understanding            Objective   No latex allergies Blood pressure is controlled per pt. DM controlled per pt     Medbridge Access Code [email or web address]      Therapeutic exercise   R grip strength 50 lbs, 54 lbs, 53 lbs  Grip strength returning.   -Scaption P/ROM BUE stretch on chest press 3x30sec   standing BUE shoulder ABDCT 1lbFW 1x15 bilat, mirror for feedback on stopping at 90 degrees  -standing Rt shoulder flexion 2lb 1x10  -standing Lt shoulder flexion 2lb 1x10   -standing BUE supinated ER RedTB 1x15   -seated chest press 10lb 1x15 (starting position in minimal shoulder extension only)   -standing row down 20lb, 1x12 (tactile cues for thoracic extension)    -standing BUE shoulder ABDCT 1lbFW 1x15 bilat, mirror for feedback on stopping at 90 degrees -standing Rt shoulder flexion 2lb 1x10 -standing Lt  shoulder flexion 2lb 1x10  -standing BUE supinated ER RedTB 1x15   -standing row down 20lb, 1x12 (tactile cues for thoracic extension)   -seated chest press 10lb 1x15 (starting position in minimal shoulder extension only  Seated manually resisted scapular retraction isometrics targeting lower trap muscles 10x3 with 5 second holds   Seated manually resisted R triceps extension isometrics, 10x3 with 5 second holds    Improved exercise technique, movement at target joints, use of target muscles after mod verbal, visual, tactile cues.    Response to treatment R UE feels better after exercises.      Clinical impression Decreased R shoulder and arm pain after performing UE strengthening exercises. Decreased R anterior arm muscle tension with triceps strengthening for reciprocal inhibition. Pt states R UE feeling better after session. Pt will benefit from continued skilled physical therapy services to decrease pain, improve strength, ROM, and function.      PT Short Term Goals - 04/09/21 1023       PT SHORT TERM GOAL #1   Title Pt will be independent with her initial HEP to decrease pain, improve strength and ability to raise her R arm and reach more comfortably.    Baseline Pt has started her HEP (03/10/2021)    Time 3    Period Weeks    Status Achieved    Target Date 04/02/21               PT Long Term Goals - 04/09/21 1023       PT LONG TERM GOAL #1   Title Pt will have a decrease in R arm pain to 5/10 or less at worst to promote ability to raise her R arm up, reach, as well as bend at her elbow more comfortably.    Baseline 9/10 R arm pain at most for the past 3 months (03/10/2021); 8/30: 8/10 at worst; 04/09/21 6/10    Time 8    Period Weeks    Status On-going    Target Date 05/07/21      PT LONG TERM GOAL #2   Title Pt will improve R shoulder flexion and abduction AROM by at least 20 degrees to promote ability to raise her arm up more comfortably.    Baseline R  shoulder AROM flexion 113 degrees, abduction 92 degrees (03/10/2021); 8/30: Achieved in abduction, flexion unchanged    Time 8    Period Weeks    Status On-going    Target Date 05/07/21      PT LONG TERM GOAL #3  Title Pt will improve her FOTO score by at least 10 points as a demonstration of improved function.    Baseline Shoulder FOTO 50 (03/10/2021); 04/07/21: 55    Time 8    Period Weeks    Status On-going    Target Date 05/07/21                   Plan - 04/14/21 1016     Clinical Impression Statement Decreased R shoulder and arm pain after performing UE strengthening exercises. Decreased R anterior arm muscle tension with triceps strengthening for reciprocal inhibition. Pt states R UE feeling better after session. Pt will benefit from continued skilled physical therapy services to decrease pain, improve strength, ROM, and function.    Personal Factors and Comorbidities Age;Comorbidity 2;Fitness;Past/Current Experience;Time since onset of injury/illness/exacerbation    Comorbidities DM, HTN    Examination-Activity Limitations Lift;Reach Overhead;Toileting;Self Feeding;Carry;Sleep    Stability/Clinical Decision Making Stable/Uncomplicated    Clinical Decision Making Low    Rehab Potential Poor    PT Frequency 2x / week    PT Duration 8 weeks    PT Treatment/Interventions Manual techniques;Neuromuscular re-education;Patient/family education;Therapeutic exercise;Therapeutic activities;Electrical Stimulation;Iontophoresis '4mg'$ /ml Dexamethasone;Dry needling    PT Next Visit Plan posture, scapular, shoulder anterior cervical strengthening, manual techniques, modalities PRN    PT Home Exercise Plan Medbridge Access Code [email or web address], no updates this date    Consulted and Agree with Plan of Care Patient             Patient will benefit from skilled therapeutic intervention in order to improve the following deficits and impairments:  Pain, Improper body mechanics, Postural  dysfunction, Impaired UE functional use, Decreased strength, Decreased range of motion  Visit Diagnosis: Cervicalgia  Radiculopathy, cervical region     Problem List Patient Active Problem List   Diagnosis Date Noted   Cubital tunnel syndrome, right 07/16/2020   Meralgia paraesthetica, left 07/16/2020   Ulnar neuropathy at elbow, right 07/16/2020   Numbness and tingling 06/10/2020   Encounter for long-term (current) use of high-risk medication 06/02/2020   LFT elevation 06/02/2020   Osteoarthritis of spine with radiculopathy, cervical region 06/02/2020   Primary osteoarthritis involving multiple joints 06/02/2020   Rheumatoid arthritis of multiple sites with negative rheumatoid factor ([place]) 06/02/2020   Acute posthemorrhagic anemia 03/27/2020   Hip pain 03/27/2020   Osteoarthritis 03/27/2020   Sinus tachycardia 03/27/2020   History of total hip arthroplasty 12/18/2019   Proteinuria, unspecified 10/10/2019   Microcalcification of left breast on mammogram 05/03/2017   Herniated intervertebral disc of lumbar spine 09/02/2016   Spondylolisthesis of lumbar region 09/02/2016   Controlled type 2 diabetes mellitus with microalbuminuria, without long-term current use of insulin ([place]) 02/24/2015   Vitamin D deficiency 02/24/2015   Perennial allergic rhinitis 02/24/2015   Hypercalcemia 02/24/2015   GERD without esophagitis 02/24/2015   Hypertension, benign 02/24/2015   Controlled gout 02/24/2015   Fatty liver 02/24/2015   Callus of foot 02/24/2015   Primary osteoarthritis of right hip 02/24/2015   History of carpal tunnel syndrome 02/24/2015   Obesity (BMI 30-39.9) Q000111Q   Umbilical hernia without obstruction or gangrene 02/24/2015   Intermittent low back pain 02/24/2015   History of colonic polyps 02/24/2015   Dyslipidemia 02/24/2015    Joneen Boers PT, DPT   04/14/2021, 11:09 AM  [facility] [address] [place], [address] Phone: [phone]   Fax:  [phone]  Name: Maria Cruz  MRN: [id number] Date of Birth: [birth year]

## 2021-04-16 ENCOUNTER — Ambulatory Visit: Payer: Managed Care, Other (non HMO)

## 2021-04-16 DIAGNOSIS — M25511 Pain in right shoulder: Secondary | ICD-10-CM

## 2021-04-16 DIAGNOSIS — M542 Cervicalgia: Secondary | ICD-10-CM | POA: Diagnosis not present

## 2021-04-16 DIAGNOSIS — M5412 Radiculopathy, cervical region: Secondary | ICD-10-CM

## 2021-04-16 DIAGNOSIS — M6281 Muscle weakness (generalized): Secondary | ICD-10-CM

## 2021-04-16 NOTE — Therapy (Signed)
[place] [facility] [address], [place], [address] Phone: [phone]   Fax:  [phone]  Physical Therapy Treatment  Patient Details  Name: Maria Cruz MRN: [id number] Date of Birth: 03-06-1956 Referring Provider (PT): Terrilee Croak, MD   Encounter Date: 04/16/2021   PT End of Session - 04/16/21 1017     Visit Number 12    Number of Visits 17    Date for PT Re-Evaluation 05/07/21    Authorization Type [employer] Managed    Authorization Time Period Cert [id number]    Progress Note Due on Visit 20    PT Start Time 1018    PT Stop Time 1100    PT Time Calculation (min) 42 min    Activity Tolerance Patient tolerated treatment well;No increased pain    Behavior During Therapy [facility] for tasks assessed/performed             Past Medical History:  Diagnosis Date   Allergy    Arthritis    Back pain with radiation    Calluse    Foot   Colon polyp    Diabetes mellitus without complication ([place]) Q000111Q   Type II    Fatty liver    GERD (gastroesophageal reflux disease)    History of total hip replacement, left 12/05/2019   [facility]   Hyperlipidemia    Hypertension    Lumbar radiculopathy    Dr. Mack Guise   Obesity    Plantar fasciitis, left    Vitamin D deficiency     Past Surgical History:  Procedure Laterality Date   ABDOMINAL HYSTERECTOMY  2012   ANTERIOR INTEROSSEOUS NERVE DECOMPRESSION Right 12/16/2020   Procedure: Right ulnar nerve release, elbow;  Surgeon: Hessie Knows, MD;  Location: [facility] ORS;  Service: Orthopedics;  Laterality: Right;   APPENDECTOMY  2012   BREAST BIOPSY Right 07/12/2012   Negative   BREAST BIOPSY Left 05/09/2017   Affirm Bx of two areas-FIBROADENOMA WITH COARSE CALCIFICATION. NEGATIVE FOR ATYPIA,COLLAPSED CYST AND FIBROADENOMATOID CHANGE   COLONOSCOPY  2009   Dr Allen Norris   COLONOSCOPY WITH PROPOFOL N/A 07/27/2017   Procedure: COLONOSCOPY  WITH PROPOFOL;  Surgeon: Robert Bellow, MD;  Location: [facility] ENDOSCOPY;  Service: Endoscopy;  Laterality: N/A;   DILATION AND CURETTAGE OF UTERUS  2007   TUBAL LIGATION      There were no vitals filed for this visit.   Subjective Assessment - 04/16/21 1018     Subjective 3/10 currently. Was pain free after last session.    Pertinent History R shoulder pain. Pain began since April 2021. Pt had L hip surgery and feels like lying on her R side might have bothered her R shoulder, arm, and elbow. Had R ulnar nerve release last month and participated in OT for surgical rehab. Pain as worsened since onset. Has not yet had PT for R shoulder. Pt also feels like her grip is off. Still has swelling at 4th and 5th digits.    Currently in Pain? Yes    Pain Score 3                                         PT Education - 04/16/21 1021     Education Details ther-ex    Person(s) Educated Patient    Methods Explanation;Demonstration;Tactile cues;Verbal cues    Comprehension  Returned demonstration;Verbalized understanding           Objective   No latex allergies Blood pressure is controlled per pt. DM controlled per pt     Medbridge Access Code [email or web address]       Therapeutic exercise  Scaption P/ROM BUE stretch on chest press 3x30sec   -seated chest press 10lb 2x15 (starting position in minimal shoulder extension only)   -standing row down 20lb, 2x12 (tactile cues for thoracic extension)    Wall push ups 10x2  Scapular retraction green band 10x5 seconds for 3 sets  standing BUE shoulder ABDCT 1lb 2x15 bilat, mirror for feedback on stopping at 90 degrees   -standing Rt shoulder flexion 2lb 2x10   -standing Lt shoulder flexion 2lb 2x10    -standing BUE supinated ER RedTB 1x15      Seated manually resisted scapular retraction isometrics targeting lower trap muscles 10x3 with 5 second holds    Seated manually resisted R triceps extension isometrics,  10x2 with 5 second holds      Improved exercise technique, movement at target joints, use of target muscles after mod verbal, visual, tactile cues.    Response to treatment R UE feels much better after session per pt     Clinical impression Pt able to maintain 3/10 starting R shoulder and arm pain with reports of being pain free after last session. Continued with UE strengthening exercises. Decreased R anterior arm muscle tension with triceps strengthening for reciprocal inhibition. Pt states R UE feeling better after session. Pt will benefit from continued skilled physical therapy services to decrease pain, improve strength, ROM, and function.           PT Short Term Goals - 04/09/21 1023       PT SHORT TERM GOAL #1   Title Pt will be independent with her initial HEP to decrease pain, improve strength and ability to raise her R arm and reach more comfortably.    Baseline Pt has started her HEP (03/10/2021)    Time 3    Period Weeks    Status Achieved    Target Date 04/02/21               PT Long Term Goals - 04/09/21 1023       PT LONG TERM GOAL #1   Title Pt will have a decrease in R arm pain to 5/10 or less at worst to promote ability to raise her R arm up, reach, as well as bend at her elbow more comfortably.    Baseline 9/10 R arm pain at most for the past 3 months (03/10/2021); 8/30: 8/10 at worst; 04/09/21 6/10    Time 8    Period Weeks    Status On-going    Target Date 05/07/21      PT LONG TERM GOAL #2   Title Pt will improve R shoulder flexion and abduction AROM by at least 20 degrees to promote ability to raise her arm up more comfortably.    Baseline R shoulder AROM flexion 113 degrees, abduction 92 degrees (03/10/2021); 8/30: Achieved in abduction, flexion unchanged    Time 8    Period Weeks    Status On-going    Target Date 05/07/21      PT LONG TERM GOAL #3   Title Pt will improve her FOTO score by at least 10 points as a demonstration of improved  function.    Baseline Shoulder FOTO 50 (03/10/2021); 04/07/21: 55    Time  8    Period Weeks    Status On-going    Target Date 05/07/21                   Plan - 04/16/21 1017     Clinical Impression Statement Pt able to maintain 3/10 starting R shoulder and arm pain with reports of being pain free after last session. Continued with UE strengthening exercises. Decreased R anterior arm muscle tension with triceps strengthening for reciprocal inhibition. Pt states R UE feeling better after session. Pt will benefit from continued skilled physical therapy services to decrease pain, improve strength, ROM, and function.    Personal Factors and Comorbidities Age;Comorbidity 2;Fitness;Past/Current Experience;Time since onset of injury/illness/exacerbation    Comorbidities DM, HTN    Examination-Activity Limitations Lift;Reach Overhead;Toileting;Self Feeding;Carry;Sleep    Stability/Clinical Decision Making Stable/Uncomplicated    Clinical Decision Making Low    Rehab Potential Poor    PT Frequency 2x / week    PT Duration 8 weeks    PT Treatment/Interventions Manual techniques;Neuromuscular re-education;Patient/family education;Therapeutic exercise;Therapeutic activities;Electrical Stimulation;Iontophoresis '4mg'$ /ml Dexamethasone;Dry needling    PT Next Visit Plan posture, scapular, shoulder anterior cervical strengthening, manual techniques, modalities PRN    PT Home Exercise Plan Medbridge Access Code [email or web address], no updates this date    Consulted and Agree with Plan of Care Patient             Patient will benefit from skilled therapeutic intervention in order to improve the following deficits and impairments:  Pain, Improper body mechanics, Postural dysfunction, Impaired UE functional use, Decreased strength, Decreased range of motion  Visit Diagnosis: Cervicalgia  Radiculopathy, cervical region  Pain in joint of right shoulder  Muscle weakness (generalized)     Problem  List Patient Active Problem List   Diagnosis Date Noted   Cubital tunnel syndrome, right 07/16/2020   Meralgia paraesthetica, left 07/16/2020   Ulnar neuropathy at elbow, right 07/16/2020   Numbness and tingling 06/10/2020   Encounter for long-term (current) use of high-risk medication 06/02/2020   LFT elevation 06/02/2020   Osteoarthritis of spine with radiculopathy, cervical region 06/02/2020   Primary osteoarthritis involving multiple joints 06/02/2020   Rheumatoid arthritis of multiple sites with negative rheumatoid factor ([place]) 06/02/2020   Acute posthemorrhagic anemia 03/27/2020   Hip pain 03/27/2020   Osteoarthritis 03/27/2020   Sinus tachycardia 03/27/2020   History of total hip arthroplasty 12/18/2019   Proteinuria, unspecified 10/10/2019   Microcalcification of left breast on mammogram 05/03/2017   Herniated intervertebral disc of lumbar spine 09/02/2016   Spondylolisthesis of lumbar region 09/02/2016   Controlled type 2 diabetes mellitus with microalbuminuria, without long-term current use of insulin ([place]) 02/24/2015   Vitamin D deficiency 02/24/2015   Perennial allergic rhinitis 02/24/2015   Hypercalcemia 02/24/2015   GERD without esophagitis 02/24/2015   Hypertension, benign 02/24/2015   Controlled gout 02/24/2015   Fatty liver 02/24/2015   Callus of foot 02/24/2015   Primary osteoarthritis of right hip 02/24/2015   History of carpal tunnel syndrome 02/24/2015   Obesity (BMI 30-39.9) Q000111Q   Umbilical hernia without obstruction or gangrene 02/24/2015   Intermittent low back pain 02/24/2015   History of colonic polyps 02/24/2015   Dyslipidemia 02/24/2015    Joneen Boers PT, DPT   04/16/2021, 7:17 PM  [place] [facility] [address], [place], [address] Phone: [phone]   Fax:  [phone]  Name: Maria Cruz MRN: [id number] Date of Birth: 03-Dec-1955

## 2021-04-22 ENCOUNTER — Ambulatory Visit: Payer: Managed Care, Other (non HMO)

## 2021-04-22 DIAGNOSIS — M542 Cervicalgia: Secondary | ICD-10-CM | POA: Diagnosis not present

## 2021-04-22 DIAGNOSIS — M25511 Pain in right shoulder: Secondary | ICD-10-CM

## 2021-04-22 DIAGNOSIS — M5412 Radiculopathy, cervical region: Secondary | ICD-10-CM

## 2021-04-22 DIAGNOSIS — M6281 Muscle weakness (generalized): Secondary | ICD-10-CM

## 2021-04-22 NOTE — Therapy (Signed)
[place] [facility] [address] [place], [address] Phone: [phone]   Fax:  [phone]  Physical Therapy Treatment  Patient Details  Name: Maria Cruz MRN: [id number] Date of Birth: Jul 18, 1956 Referring Provider (PT): Terrilee Croak, MD   Encounter Date: 04/22/2021   PT End of Session - 04/22/21 0807     Visit Number 13    Number of Visits 17    Date for PT Re-Evaluation 05/07/21    Authorization Type [employer] Managed    Authorization Time Period Cert [id number]    Progress Note Due on Visit 20    PT Start Time 0807    PT Stop Time 0846    PT Time Calculation (min) 39 min    Activity Tolerance Patient tolerated treatment well;No increased pain    Behavior During Therapy [facility] for tasks assessed/performed             Past Medical History:  Diagnosis Date   Allergy    Arthritis    Back pain with radiation    Calluse    Foot   Colon polyp    Diabetes mellitus without complication ([place]) Q000111Q   Type II    Fatty liver    GERD (gastroesophageal reflux disease)    History of total hip replacement, left 12/05/2019   [facility]   Hyperlipidemia    Hypertension    Lumbar radiculopathy    Dr. Mack Guise   Obesity    Plantar fasciitis, left    Vitamin D deficiency     Past Surgical History:  Procedure Laterality Date   ABDOMINAL HYSTERECTOMY  2012   ANTERIOR INTEROSSEOUS NERVE DECOMPRESSION Right 12/16/2020   Procedure: Right ulnar nerve release, elbow;  Surgeon: Hessie Knows, MD;  Location: [facility] ORS;  Service: Orthopedics;  Laterality: Right;   APPENDECTOMY  2012   BREAST BIOPSY Right 07/12/2012   Negative   BREAST BIOPSY Left 05/09/2017   Affirm Bx of two areas-FIBROADENOMA WITH COARSE CALCIFICATION. NEGATIVE FOR ATYPIA,COLLAPSED CYST AND FIBROADENOMATOID CHANGE   COLONOSCOPY  2009   Dr Allen Norris   COLONOSCOPY WITH PROPOFOL N/A 07/27/2017   Procedure:  COLONOSCOPY WITH PROPOFOL;  Surgeon: Robert Bellow, MD;  Location: [facility] ENDOSCOPY;  Service: Endoscopy;  Laterality: N/A;   DILATION AND CURETTAGE OF UTERUS  2007   TUBAL LIGATION      There were no vitals filed for this visit.   Subjective Assessment - 04/22/21 0808     Subjective R shoulder is a little pinching in the arm... about a 5/10 currently.    Pertinent History R shoulder pain. Pain began since April 2021. Pt had L hip surgery and feels like lying on her R side might have bothered her R shoulder, arm, and elbow. Had R ulnar nerve release last month and participated in OT for surgical rehab. Pain as worsened since onset. Has not yet had PT for R shoulder. Pt also feels like her grip is off. Still has swelling at 4th and 5th digits.    Currently in Pain? Yes    Pain Score 5                                         PT Education - 04/22/21 0814     Education Details ther-ex    Person(s) Educated Patient    Methods Explanation;Demonstration;Tactile cues;Verbal  cues    Comprehension Returned demonstration;Verbalized understanding             Objective   No latex allergies Blood pressure is controlled per pt. DM controlled per pt     Medbridge Access Code [email or web address]     Therapeutic exercise    Wall push ups 10x3  Scapular retraction green band 10x5 seconds for 3 sets with 5 second holds   Standing B shoulder extension with scapular retraction 15x2  -standing BUE supinated ER RedTB 1x15   Scaption P/ROM BUE stretch on chest press 3x30sec    -seated chest press 10lb 2x15 (starting position in minimal shoulder extension only)   R first rib stretch 30 seconds x 3  Decreased R 5th digit paresthesia       Improved exercise technique, movement at target joints, use of target muscles after mod verbal, visual, tactile cues.     Manual Therapy   STM R upper trap muscle area to decrease fascial restrictions     Response to  treatment R UE feels much better after session per pt. Decreased R arm pain to 2/10 and improved R 5th digit sensation      Clinical impression Decreased R arm pain with treatment to promote triceps muscle strengthening for reciprocal inhibition of brachialis muscle. Improved R 5th digit sensation with treatment to decrease R scalene muscle tension. Pt will benefit from continued skilled physical therapy services to decrease pain, improve strength, ROM, and function.        PT Short Term Goals - 04/09/21 1023       PT SHORT TERM GOAL #1   Title Pt will be independent with her initial HEP to decrease pain, improve strength and ability to raise her R arm and reach more comfortably.    Baseline Pt has started her HEP (03/10/2021)    Time 3    Period Weeks    Status Achieved    Target Date 04/02/21               PT Long Term Goals - 04/09/21 1023       PT LONG TERM GOAL #1   Title Pt will have a decrease in R arm pain to 5/10 or less at worst to promote ability to raise her R arm up, reach, as well as bend at her elbow more comfortably.    Baseline 9/10 R arm pain at most for the past 3 months (03/10/2021); 8/30: 8/10 at worst; 04/09/21 6/10    Time 8    Period Weeks    Status On-going    Target Date 05/07/21      PT LONG TERM GOAL #2   Title Pt will improve R shoulder flexion and abduction AROM by at least 20 degrees to promote ability to raise her arm up more comfortably.    Baseline R shoulder AROM flexion 113 degrees, abduction 92 degrees (03/10/2021); 8/30: Achieved in abduction, flexion unchanged    Time 8    Period Weeks    Status On-going    Target Date 05/07/21      PT LONG TERM GOAL #3   Title Pt will improve her FOTO score by at least 10 points as a demonstration of improved function.    Baseline Shoulder FOTO 50 (03/10/2021); 04/07/21: 55    Time 8    Period Weeks    Status On-going    Target Date 05/07/21  Plan - 04/22/21 0824      Clinical Impression Statement Decreased R arm pain with treatment to promote triceps muscle strengthening for reciprocal inhibition of brachialis muscle. Improved R 5th digit sensation with treatment to decrease R scalene muscle tension. Pt will benefit from continued skilled physical therapy services to decrease pain, improve strength, ROM, and function.    Personal Factors and Comorbidities Age;Comorbidity 2;Fitness;Past/Current Experience;Time since onset of injury/illness/exacerbation    Comorbidities DM, HTN    Examination-Activity Limitations Lift;Reach Overhead;Toileting;Self Feeding;Carry;Sleep    Stability/Clinical Decision Making Stable/Uncomplicated    Clinical Decision Making Low    Rehab Potential Poor    PT Frequency 2x / week    PT Duration 8 weeks    PT Treatment/Interventions Manual techniques;Neuromuscular re-education;Patient/family education;Therapeutic exercise;Therapeutic activities;Electrical Stimulation;Iontophoresis '4mg'$ /ml Dexamethasone;Dry needling    PT Next Visit Plan posture, scapular, shoulder anterior cervical strengthening, manual techniques, modalities PRN    PT Home Exercise Plan Medbridge Access Code [email or web address], no updates this date    Consulted and Agree with Plan of Care Patient             Patient will benefit from skilled therapeutic intervention in order to improve the following deficits and impairments:  Pain, Improper body mechanics, Postural dysfunction, Impaired UE functional use, Decreased strength, Decreased range of motion  Visit Diagnosis: Cervicalgia  Radiculopathy, cervical region  Pain in joint of right shoulder  Muscle weakness (generalized)     Problem List Patient Active Problem List   Diagnosis Date Noted   Cubital tunnel syndrome, right 07/16/2020   Meralgia paraesthetica, left 07/16/2020   Ulnar neuropathy at elbow, right 07/16/2020   Numbness and tingling 06/10/2020   Encounter for long-term (current) use of high-risk  medication 06/02/2020   LFT elevation 06/02/2020   Osteoarthritis of spine with radiculopathy, cervical region 06/02/2020   Primary osteoarthritis involving multiple joints 06/02/2020   Rheumatoid arthritis of multiple sites with negative rheumatoid factor ([place]) 06/02/2020   Acute posthemorrhagic anemia 03/27/2020   Hip pain 03/27/2020   Osteoarthritis 03/27/2020   Sinus tachycardia 03/27/2020   History of total hip arthroplasty 12/18/2019   Proteinuria, unspecified 10/10/2019   Microcalcification of left breast on mammogram 05/03/2017   Herniated intervertebral disc of lumbar spine 09/02/2016   Spondylolisthesis of lumbar region 09/02/2016   Controlled type 2 diabetes mellitus with microalbuminuria, without long-term current use of insulin ([place]) 02/24/2015   Vitamin D deficiency 02/24/2015   Perennial allergic rhinitis 02/24/2015   Hypercalcemia 02/24/2015   GERD without esophagitis 02/24/2015   Hypertension, benign 02/24/2015   Controlled gout 02/24/2015   Fatty liver 02/24/2015   Callus of foot 02/24/2015   Primary osteoarthritis of right hip 02/24/2015   History of carpal tunnel syndrome 02/24/2015   Obesity (BMI 30-39.9) Q000111Q   Umbilical hernia without obstruction or gangrene 02/24/2015   Intermittent low back pain 02/24/2015   History of colonic polyps 02/24/2015   Dyslipidemia 02/24/2015    Joneen Boers PT, DPT   04/22/2021, 9:45 AM  [facility] [facility] [address], [place], [address] Phone: [phone]   Fax:  [phone]  Name: Maria Cruz MRN: [id number] Date of Birth: Dec 11, 1955

## 2021-04-23 ENCOUNTER — Ambulatory Visit: Payer: Managed Care, Other (non HMO)

## 2021-04-23 DIAGNOSIS — M5412 Radiculopathy, cervical region: Secondary | ICD-10-CM

## 2021-04-23 DIAGNOSIS — M542 Cervicalgia: Secondary | ICD-10-CM

## 2021-04-23 NOTE — Therapy (Signed)
[facility] [address], [place], [address] Phone: [phone]   Fax:  [phone]  Physical Therapy Treatment  Patient Details  Name: Maria Cruz MRN: [id number] Date of Birth: April 25, 1956 Referring Provider (PT): Terrilee Croak, MD   Encounter Date: 04/23/2021   PT End of Session - 04/23/21 1059     Visit Number 14    Number of Visits 17    Date for PT Re-Evaluation 05/07/21    Authorization Type [employer] Managed    Authorization Time Period Cert [id number]    Progress Note Due on Visit 20    PT Start Time 1059    PT Stop Time 1140    PT Time Calculation (min) 41 min    Activity Tolerance Patient tolerated treatment well;No increased pain    Behavior During Therapy [facility] for tasks assessed/performed             Past Medical History:  Diagnosis Date   Allergy    Arthritis    Back pain with radiation    Calluse    Foot   Colon polyp    Diabetes mellitus without complication ([place]) Q000111Q   Type II    Fatty liver    GERD (gastroesophageal reflux disease)    History of total hip replacement, left 12/05/2019   [facility]   Hyperlipidemia    Hypertension    Lumbar radiculopathy    Dr. Mack Guise   Obesity    Plantar fasciitis, left    Vitamin D deficiency     Past Surgical History:  Procedure Laterality Date   ABDOMINAL HYSTERECTOMY  2012   ANTERIOR INTEROSSEOUS NERVE DECOMPRESSION Right 12/16/2020   Procedure: Right ulnar nerve release, elbow;  Surgeon: Hessie Knows, MD;  Location: [facility] ORS;  Service: Orthopedics;  Laterality: Right;   APPENDECTOMY  2012   BREAST BIOPSY Right 07/12/2012   Negative   BREAST BIOPSY Left 05/09/2017   Affirm Bx of two areas-FIBROADENOMA WITH COARSE CALCIFICATION. NEGATIVE FOR ATYPIA,COLLAPSED CYST AND FIBROADENOMATOID CHANGE   COLONOSCOPY  2009   Dr Allen Norris   COLONOSCOPY WITH PROPOFOL N/A 07/27/2017   Procedure:  COLONOSCOPY WITH PROPOFOL;  Surgeon: Robert Bellow, MD;  [facility];  Service: Endoscopy;  Laterality: N/A;   DILATION AND CURETTAGE OF UTERUS  2007   TUBAL LIGATION      There were no vitals filed for this visit.   Subjective Assessment - 04/23/21 1101     Subjective R arm is good. No pain currently. Still has some tightess. Has R lateral neck and arm tightness.    Pertinent History R shoulder pain. Pain began since April 2021. Pt had L hip surgery and feels like lying on her R side might have bothered her R shoulder, arm, and elbow. Had R ulnar nerve release last month and participated in OT for surgical rehab. Pain as worsened since onset. Has not yet had PT for R shoulder. Pt also feels like her grip is off. Still has swelling at 4th and 5th digits.    Currently in Pain? No/denies                                        PT Education - 04/23/21 1244     Education Details ther-ex    Methods Explanation;Demonstration;Tactile cues;Verbal cues    Comprehension Returned demonstration;Verbalized understanding  Objective   No latex allergies Blood pressure is controlled per pt. DM controlled per pt     Medbridge Access Code [email or web address]     Therapeutic exercise   Chin tucks 10x3 with 5 second holds    R first rib stretch 30 seconds x 3             Decreased R 5th digit paresthesia  Standing R brachialis stretch 30 seconds x 4  Scapular retraction green band 10x5 seconds for 3 sets with 5 second holds    Wall push ups with plus 10x3   Feels a release in her R arm.   Seated manually resisted scapular retraction 10x3 with 5 second holds targeting the lower trap        Improved exercise technique, movement at target joints, use of target muscles after mod verbal, visual, tactile cues.      Manual Therapy   STM R upper trap and cervical paraspinal muscle area to decrease tension and fascial restrictions         Response to treatment R UE feels much better after session per pt. No tigheness reported afterwarsds    Clinical impression Decreased R arm symptoms with treatment to decrease R brachialis and upper trap muscle tension, improving scapular and triceps strength. Pt will benefit from continued skilled physical therapy services to decrease pain, improve strength, ROM, and function.        PT Short Term Goals - 04/09/21 1023       PT SHORT TERM GOAL #1   Title Pt will be independent with her initial HEP to decrease pain, improve strength and ability to raise her R arm and reach more comfortably.    Baseline Pt has started her HEP (03/10/2021)    Time 3    Period Weeks    Status Achieved    Target Date 04/02/21               PT Long Term Goals - 04/09/21 1023       PT LONG TERM GOAL #1   Title Pt will have a decrease in R arm pain to 5/10 or less at worst to promote ability to raise her R arm up, reach, as well as bend at her elbow more comfortably.    Baseline 9/10 R arm pain at most for the past 3 months (03/10/2021); 8/30: 8/10 at worst; 04/09/21 6/10    Time 8    Period Weeks    Status On-going    Target Date 05/07/21      PT LONG TERM GOAL #2   Title Pt will improve R shoulder flexion and abduction AROM by at least 20 degrees to promote ability to raise her arm up more comfortably.    Baseline R shoulder AROM flexion 113 degrees, abduction 92 degrees (03/10/2021); 8/30: Achieved in abduction, flexion unchanged    Time 8    Period Weeks    Status On-going    Target Date 05/07/21      PT LONG TERM GOAL #3   Title Pt will improve her FOTO score by at least 10 points as a demonstration of improved function.    Baseline Shoulder FOTO 50 (03/10/2021); 04/07/21: 55    Time 8    Period Weeks    Status On-going    Target Date 05/07/21                   Plan - 04/23/21 1058     Clinical Impression  Statement Decreased R arm symptoms with treatment to decrease R  brachialis and upper trap muscle tension, improving scapular and triceps strength. Pt will benefit from continued skilled physical therapy services to decrease pain, improve strength, ROM, and function.    Personal Factors and Comorbidities Age;Comorbidity 2;Fitness;Past/Current Experience;Time since onset of injury/illness/exacerbation    Comorbidities DM, HTN    Examination-Activity Limitations Lift;Reach Overhead;Toileting;Self Feeding;Carry;Sleep    Stability/Clinical Decision Making Stable/Uncomplicated    Rehab Potential Poor    PT Frequency 2x / week    PT Duration 8 weeks    PT Treatment/Interventions Manual techniques;Neuromuscular re-education;Patient/family education;Therapeutic exercise;Therapeutic activities;Electrical Stimulation;Iontophoresis '4mg'$ /ml Dexamethasone;Dry needling    PT Next Visit Plan posture, scapular, shoulder anterior cervical strengthening, manual techniques, modalities PRN    PT Home Exercise Plan Medbridge Access Code [email or web address], no updates this date    Consulted and Agree with Plan of Care Patient             Patient will benefit from skilled therapeutic intervention in order to improve the following deficits and impairments:  Pain, Improper body mechanics, Postural dysfunction, Impaired UE functional use, Decreased strength, Decreased range of motion  Visit Diagnosis: Cervicalgia  Radiculopathy, cervical region     Problem List Patient Active Problem List   Diagnosis Date Noted   Cubital tunnel syndrome, right 07/16/2020   Meralgia paraesthetica, left 07/16/2020   Ulnar neuropathy at elbow, right 07/16/2020   Numbness and tingling 06/10/2020   Encounter for long-term (current) use of high-risk medication 06/02/2020   LFT elevation 06/02/2020   Osteoarthritis of spine with radiculopathy, cervical region 06/02/2020   Primary osteoarthritis involving multiple joints 06/02/2020   Rheumatoid arthritis of multiple sites with negative rheumatoid  factor ([place]) 06/02/2020   Acute posthemorrhagic anemia 03/27/2020   Hip pain 03/27/2020   Osteoarthritis 03/27/2020   Sinus tachycardia 03/27/2020   History of total hip arthroplasty 12/18/2019   Proteinuria, unspecified 10/10/2019   Microcalcification of left breast on mammogram 05/03/2017   Herniated intervertebral disc of lumbar spine 09/02/2016   Spondylolisthesis of lumbar region 09/02/2016   Controlled type 2 diabetes mellitus with microalbuminuria, without long-term current use of insulin ([place]) 02/24/2015   Vitamin D deficiency 02/24/2015   Perennial allergic rhinitis 02/24/2015   Hypercalcemia 02/24/2015   GERD without esophagitis 02/24/2015   Hypertension, benign 02/24/2015   Controlled gout 02/24/2015   Fatty liver 02/24/2015   Callus of foot 02/24/2015   Primary osteoarthritis of right hip 02/24/2015   History of carpal tunnel syndrome 02/24/2015   Obesity (BMI 30-39.9) Q000111Q   Umbilical hernia without obstruction or gangrene 02/24/2015   Intermittent low back pain 02/24/2015   History of colonic polyps 02/24/2015   Dyslipidemia 02/24/2015    Joneen Boers PT, DPT   04/23/2021, 12:56 PM  [facility] [address], [place], [address] Phone: [phone]   Fax:  [phone]  Name: Maria Cruz MRN: [id number] Date of Birth: [birth year]

## 2021-04-27 ENCOUNTER — Other Ambulatory Visit: Payer: Self-pay | Admitting: Family Medicine

## 2021-04-27 DIAGNOSIS — B3731 Acute candidiasis of vulva and vagina: Secondary | ICD-10-CM

## 2021-04-27 DIAGNOSIS — B373 Candidiasis of vulva and vagina: Secondary | ICD-10-CM

## 2021-04-27 NOTE — Telephone Encounter (Signed)
Called patient to see if she needed the refill,left voicemail for return call.

## 2021-04-28 ENCOUNTER — Ambulatory Visit: Payer: Managed Care, Other (non HMO)

## 2021-04-28 DIAGNOSIS — M542 Cervicalgia: Secondary | ICD-10-CM

## 2021-04-28 DIAGNOSIS — M5412 Radiculopathy, cervical region: Secondary | ICD-10-CM

## 2021-04-28 NOTE — Patient Instructions (Signed)
Access Code: [email or web address] URL: [email or web address] Date: 04/28/2021 Prepared by: Joneen Boers  Exercises Seated Cervical Retraction - 1 x daily - 7 x weekly - 3 sets - 10 reps - 5 seconds hold Seated Scapular Retraction - 1 x daily - 7 x weekly - 3 sets - 10 reps - 5 seconds hold First Rib Mobilization with Strap - 3 x daily - 7 x weekly - 1 sets - 3 reps - 30 seconds hold Shoulder Abduction with Dumbbells - Palms Down - 1 x daily - 7 x weekly - 2 sets - 15 reps Tricep Push Up on Wall - 1 x daily - 7 x weekly - 3 sets - 10 reps Scapular Retraction with Resistance - 1 x daily - 7 x weekly - 3 sets - 10 reps - 5 seconds hold First Rib Mobilization with Strap - 3 x daily - 7 x weekly - 1 sets - 3 reps - 30 seconds hold Single Arm Shoulder Extension with Anchored Resistance - 1 x daily - 7 x weekly - 3 sets - 10 reps - 5 seconds hold Supine Single Arm Shoulder Protraction - 1 x daily - 7 x weekly - 3 sets - 10 reps - 5 seconds hold

## 2021-04-28 NOTE — Therapy (Signed)
[facility] [address], [place], [address] Phone: [phone]   Fax:  [phone]  Physical Therapy Treatment  Patient Details  Name: Maria Cruz MRN: [id number] Date of Birth: 1955/09/19 Referring Provider (PT): Terrilee Croak, MD   Encounter Date: 04/28/2021   PT End of Session - 04/28/21 0810     Visit Number 15    Number of Visits 17    Date for PT Re-Evaluation 05/07/21    Authorization Type [employer] Managed    Authorization Time Period Cert 02/06/05-2/69/48    Progress Note Due on Visit 20    PT Start Time 0809    PT Stop Time 0845    PT Time Calculation (min) 36 min    Activity Tolerance Patient tolerated treatment well;No increased pain    Behavior During Therapy [facility] for tasks assessed/performed             Past Medical History:  Diagnosis Date   Allergy    Arthritis    Back pain with radiation    Calluse    Foot   Colon polyp    Diabetes mellitus without complication ([place]) 5462   Type II    Fatty liver    GERD (gastroesophageal reflux disease)    History of total hip replacement, left 12/05/2019   [facility]   Hyperlipidemia    Hypertension    Lumbar radiculopathy    Dr. Mack Guise   Obesity    Plantar fasciitis, left    Vitamin D deficiency     Past Surgical History:  Procedure Laterality Date   ABDOMINAL HYSTERECTOMY  2012   ANTERIOR INTEROSSEOUS NERVE DECOMPRESSION Right 12/16/2020   Procedure: Right ulnar nerve release, elbow;  Surgeon: Hessie Knows, MD;  Location: [facility] ORS;  Service: Orthopedics;  Laterality: Right;   APPENDECTOMY  2012   BREAST BIOPSY Right 07/12/2012   Negative   BREAST BIOPSY Left 05/09/2017   Affirm Bx of two areas-FIBROADENOMA WITH COARSE CALCIFICATION. NEGATIVE FOR ATYPIA,COLLAPSED CYST AND FIBROADENOMATOID CHANGE   COLONOSCOPY  2009   Dr Allen Norris   COLONOSCOPY WITH PROPOFOL N/A 07/27/2017   Procedure:  COLONOSCOPY WITH PROPOFOL;  Surgeon: Robert Bellow, MD;  Location: [facility] ENDOSCOPY;  Service: Endoscopy;  Laterality: N/A;   DILATION AND CURETTAGE OF UTERUS  2007   TUBAL LIGATION      There were no vitals filed for this visit.   Subjective Assessment - 04/28/21 0810     Subjective R arm is better, 4/10 currently. The good feeling after last session lasted to the weekend. Had a hard time gripping the coffee pot this weekend with her 4th and 5th fingers in R hand.    Pertinent History R shoulder pain. Pain began since April 2021. Pt had L hip surgery and feels like lying on her R side might have bothered her R shoulder, arm, and elbow. Had R ulnar nerve release last month and participated in OT for surgical rehab. Pain as worsened since onset. Has not yet had PT for R shoulder. Pt also feels like her grip is off. Still has swelling at 4th and 5th digits.    Currently in Pain? Yes    Pain Score 4                                         PT Education -  04/28/21 0829     Education Details ther-ex, HEP    Person(s) Educated Patient    Methods Explanation;Demonstration;Tactile cues;Verbal cues    Comprehension Returned demonstration;Verbalized understanding           Objective   No latex allergies Blood pressure is controlled per pt. DM controlled per pt     Medbridge Access Code [email or web address]     Therapeutic exercise   Scaption P/ROM BUE stretch on chest press 3x30sec   Palpation: improved R anterior deltoid muscle tone  Standing B shoulder extension with scapular retraction red band 10x5 seconds for 3 sets to improve posterior deltoid muscle strength  Standing horizontal R shoulder abduction, palm down to target posterior deltoid, with PT assist 10x2 yellow band  Supine with R arm at 90 degrees flexion   Scapular protraction 10x3 with 5 second holds to decrease R scapular winging.     With scapular protraction: rhytmic stabilization with PT  manual resistance 30 seconds x 3  difficult   R first rib stretch 30 seconds x 3             Decreased R 5th digit paresthesia   Wall push ups with plus 10x3                  Improved exercise technique, movement at target joints, use of target muscles after mod verbal, visual, tactile cues.     Response to treatment No pain after session.      Clinical impression Worked on posterior R deltoid muscle strengthening to correct muscle imbalance. Also worked in improving R triceps muscle strength to decrease R brachialis muscle tension and symptoms. Conitnued working on R first rib stretch to decrease pressure to ulnar nerve and  improve grip with R 4th and 5th digits. No pain after session. Pt will benefit from continued skilled physical therapy services to decrease pain, improve strength, ROM, and function.        PT Short Term Goals - 04/09/21 1023       PT SHORT TERM GOAL #1   Title Pt will be independent with her initial HEP to decrease pain, improve strength and ability to raise her R arm and reach more comfortably.    Baseline Pt has started her HEP (03/10/2021)    Time 3    Period Weeks    Status Achieved    Target Date 04/02/21               PT Long Term Goals - 04/09/21 1023       PT LONG TERM GOAL #1   Title Pt will have a decrease in R arm pain to 5/10 or less at worst to promote ability to raise her R arm up, reach, as well as bend at her elbow more comfortably.    Baseline 9/10 R arm pain at most for the past 3 months (03/10/2021); 8/30: 8/10 at worst; 04/09/21 6/10    Time 8    Period Weeks    Status On-going    Target Date 05/07/21      PT LONG TERM GOAL #2   Title Pt will improve R shoulder flexion and abduction AROM by at least 20 degrees to promote ability to raise her arm up more comfortably.    Baseline R shoulder AROM flexion 113 degrees, abduction 92 degrees (03/10/2021); 8/30: Achieved in abduction, flexion unchanged    Time 8    Period Weeks     Status On-going  Target Date 05/07/21      PT LONG TERM GOAL #3   Title Pt will improve her FOTO score by at least 10 points as a demonstration of improved function.    Baseline Shoulder FOTO 50 (03/10/2021); 04/07/21: 55    Time 8    Period Weeks    Status On-going    Target Date 05/07/21                   Plan - 04/28/21 0830     Clinical Impression Statement Worked on posterior R deltoid muscle strengthening to correct muscle imbalance. Also worked in improving R triceps muscle strength to decrease R brachialis muscle tension and symptoms. Conitnued working on R first rib stretch to decrease pressure to ulnar nerve and  improve grip with R 4th and 5th digits. No pain after session. Pt will benefit from continued skilled physical therapy services to decrease pain, improve strength, ROM, and function.    Personal Factors and Comorbidities Age;Comorbidity 2;Fitness;Past/Current Experience;Time since onset of injury/illness/exacerbation    Comorbidities DM, HTN    Examination-Activity Limitations Lift;Reach Overhead;Toileting;Self Feeding;Carry;Sleep    Stability/Clinical Decision Making Stable/Uncomplicated    Rehab Potential Poor    PT Frequency 2x / week    PT Duration 8 weeks    PT Treatment/Interventions Manual techniques;Neuromuscular re-education;Patient/family education;Therapeutic exercise;Therapeutic activities;Electrical Stimulation;Iontophoresis 4mg /ml Dexamethasone;Dry needling    PT Next Visit Plan posture, scapular, shoulder anterior cervical strengthening, manual techniques, modalities PRN    PT Home Exercise Plan Medbridge Access Code [email or web address], no updates this date    Consulted and Agree with Plan of Care Patient             Patient will benefit from skilled therapeutic intervention in order to improve the following deficits and impairments:  Pain, Improper body mechanics, Postural dysfunction, Impaired UE functional use, Decreased strength, Decreased range  of motion  Visit Diagnosis: Cervicalgia  Radiculopathy, cervical region     Problem List Patient Active Problem List   Diagnosis Date Noted   Cubital tunnel syndrome, right 07/16/2020   Meralgia paraesthetica, left 07/16/2020   Ulnar neuropathy at elbow, right 07/16/2020   Numbness and tingling 06/10/2020   Encounter for long-term (current) use of high-risk medication 06/02/2020   LFT elevation 06/02/2020   Osteoarthritis of spine with radiculopathy, cervical region 06/02/2020   Primary osteoarthritis involving multiple joints 06/02/2020   Rheumatoid arthritis of multiple sites with negative rheumatoid factor ([place]) 06/02/2020   Acute posthemorrhagic anemia 03/27/2020   Hip pain 03/27/2020   Osteoarthritis 03/27/2020   Sinus tachycardia 03/27/2020   History of total hip arthroplasty 12/18/2019   Proteinuria, unspecified 10/10/2019   Microcalcification of left breast on mammogram 05/03/2017   Herniated intervertebral disc of lumbar spine 09/02/2016   Spondylolisthesis of lumbar region 09/02/2016   Controlled type 2 diabetes mellitus with microalbuminuria, without long-term current use of insulin ([place]) 02/24/2015   Vitamin D deficiency 02/24/2015   Perennial allergic rhinitis 02/24/2015   Hypercalcemia 02/24/2015   GERD without esophagitis 02/24/2015   Hypertension, benign 02/24/2015   Controlled gout 02/24/2015   Fatty liver 02/24/2015   Callus of foot 02/24/2015   Primary osteoarthritis of right hip 02/24/2015   History of carpal tunnel syndrome 02/24/2015   Obesity (BMI 30-39.9) 15/72/6203   Umbilical hernia without obstruction or gangrene 02/24/2015   Intermittent low back pain 02/24/2015   History of colonic polyps 02/24/2015   Dyslipidemia 02/24/2015    Joneen Boers PT, DPT   04/28/2021, 9:53 AM  [facility] [address], [place], [address] Phone: [phone]   Fax:   [phone]  Name: Maria Cruz MRN: [id number] Date of Birth: 06-Nov-1955

## 2021-04-30 ENCOUNTER — Ambulatory Visit: Payer: Managed Care, Other (non HMO)

## 2021-04-30 DIAGNOSIS — M5412 Radiculopathy, cervical region: Secondary | ICD-10-CM

## 2021-04-30 DIAGNOSIS — M542 Cervicalgia: Secondary | ICD-10-CM

## 2021-04-30 NOTE — Therapy (Signed)
[facility] [address], [place], [address] Phone: [phone]   Fax:  [phone]  Physical Therapy Treatment  Patient Details  Name: Maria Cruz MRN: [id number] Date of Birth: October 12, 1955 Referring Provider (PT): Terrilee Croak, MD   Encounter Date: 04/30/2021   PT End of Session - 04/30/21 1502     Visit Number 16    Number of Visits 17    Date for PT Re-Evaluation 05/07/21    Authorization Type [employer] Managed    Authorization Time Period Cert 03/09/26-12/24/98    Progress Note Due on Visit 20    PT Start Time 1502    PT Stop Time 1547    PT Time Calculation (min) 45 min    Activity Tolerance Patient tolerated treatment well;No increased pain    Behavior During Therapy [facility] for tasks assessed/performed             Past Medical History:  Diagnosis Date   Allergy    Arthritis    Back pain with radiation    Calluse    Foot   Colon polyp    Diabetes mellitus without complication ([place]) 1749   Type II    Fatty liver    GERD (gastroesophageal reflux disease)    History of total hip replacement, left 12/05/2019   [facility]   Hyperlipidemia    Hypertension    Lumbar radiculopathy    Dr. Mack Guise   Obesity    Plantar fasciitis, left    Vitamin D deficiency     Past Surgical History:  Procedure Laterality Date   ABDOMINAL HYSTERECTOMY  2012   ANTERIOR INTEROSSEOUS NERVE DECOMPRESSION Right 12/16/2020   Procedure: Right ulnar nerve release, elbow;  Surgeon: Hessie Knows, MD;  Location: [facility] ORS;  Service: Orthopedics;  Laterality: Right;   APPENDECTOMY  2012   BREAST BIOPSY Right 07/12/2012   Negative   BREAST BIOPSY Left 05/09/2017   Affirm Bx of two areas-FIBROADENOMA WITH COARSE CALCIFICATION. NEGATIVE FOR ATYPIA,COLLAPSED CYST AND FIBROADENOMATOID CHANGE   COLONOSCOPY  2009   Dr Allen Norris   COLONOSCOPY WITH PROPOFOL N/A 07/27/2017   Procedure:  COLONOSCOPY WITH PROPOFOL;  Surgeon: Robert Bellow, MD;  Location: [facility] ENDOSCOPY;  Service: Endoscopy;  Laterality: N/A;   DILATION AND CURETTAGE OF UTERUS  2007   TUBAL LIGATION      There were no vitals filed for this visit.   Subjective Assessment - 04/30/21 1503     Subjective R arm is doing better today. 4/10 currently. Felt better after last session which lasted for 3 days.  The grip comes and goes. Wants to continue PT until 05/13/2021.    Pertinent History R shoulder pain. Pain began since April 2021. Pt had L hip surgery and feels like lying on her R side might have bothered her R shoulder, arm, and elbow. Had R ulnar nerve release last month and participated in OT for surgical rehab. Pain as worsened since onset. Has not yet had PT for R shoulder. Pt also feels like her grip is off. Still has swelling at 4th and 5th digits.    Currently in Pain? Yes    Pain Score 4                                         PT Education - 04/30/21 1508  Education Details ther-ex    Person(s) Educated Patient    Methods Explanation;Demonstration;Tactile cues;Verbal cues    Comprehension Returned demonstration;Verbalized understanding           Objective   No latex allergies Blood pressure is controlled per pt. DM controlled per pt     Medbridge Access Code [email or web address]    Manual therapy seated STM R cervical paraspinal muscles upper trap/first rib area to decrease muscle tension   Therapeutic exercise   Hand grip: 46 lbs, 51 lbs, 47 lbs   (48 lbs avg)  R first rib stretch 30 seconds x 4             Decreased R 5th digit paresthesia  Hand grip: 48 lbs, 46 lbs, 50 lbs afterward R first rib strength and manual therapy  Wall push ups with plus 10x3  At Omega machine  B shoulder extension 20 lbs 10x2  Scaption P/ROM BUE stretch on chest press 3x30sec    Seated manually resisted R shoulder extension isometrics in neutral 10x5 seconds for 3  sets  Seated manually resisted R triceps extension isometrics in 90 degrees flexion   10x5 seconds for 3 sets  Decreased R biceps muscle knot palpated     Improved exercise technique, movement at target joints, use of target muscles after mod verbal, visual, tactile cues.     Response to treatment No pain after session. Pt states that her R arm feel so much better.        Clinical impression Continued working in posterior deltoid and triceps muscle strength to decrease muscle knots to biceps and distal deltoid.  Continued working on decreasing R upper trap and scalene muscle tension to improve R hand sensation. No pain after session. Pt will benefit from continued skilled physical therapy services to decrease pain, improve strength, ROM, and function.             PT Short Term Goals - 04/09/21 1023       PT SHORT TERM GOAL #1   Title Pt will be independent with her initial HEP to decrease pain, improve strength and ability to raise her R arm and reach more comfortably.    Baseline Pt has started her HEP (03/10/2021)    Time 3    Period Weeks    Status Achieved    Target Date 04/02/21               PT Long Term Goals - 04/09/21 1023       PT LONG TERM GOAL #1   Title Pt will have a decrease in R arm pain to 5/10 or less at worst to promote ability to raise her R arm up, reach, as well as bend at her elbow more comfortably.    Baseline 9/10 R arm pain at most for the past 3 months (03/10/2021); 8/30: 8/10 at worst; 04/09/21 6/10    Time 8    Period Weeks    Status On-going    Target Date 05/07/21      PT LONG TERM GOAL #2   Title Pt will improve R shoulder flexion and abduction AROM by at least 20 degrees to promote ability to raise her arm up more comfortably.    Baseline R shoulder AROM flexion 113 degrees, abduction 92 degrees (03/10/2021); 8/30: Achieved in abduction, flexion unchanged    Time 8    Period Weeks    Status On-going    Target Date 05/07/21  PT LONG TERM GOAL #3   Title Pt will improve her FOTO score by at least 10 points as a demonstration of improved function.    Baseline Shoulder FOTO 50 (03/10/2021); 04/07/21: 55    Time 8    Period Weeks    Status On-going    Target Date 05/07/21                   Plan - 04/30/21 1508     Clinical Impression Statement Continued working in posterior deltoid and triceps muscle strength to decrease muscle knots to biceps and distal deltoid.  Continued working on decreasing R upper trap and scalene muscle tension to improve R hand sensation. No pain after session. Pt will benefit from continued skilled physical therapy services to decrease pain, improve strength, ROM, and function.    Personal Factors and Comorbidities Age;Comorbidity 2;Fitness;Past/Current Experience;Time since onset of injury/illness/exacerbation    Comorbidities DM, HTN    Examination-Activity Limitations Lift;Reach Overhead;Toileting;Self Feeding;Carry;Sleep    Stability/Clinical Decision Making Stable/Uncomplicated    Clinical Decision Making Low    Rehab Potential Poor    PT Frequency 2x / week    PT Duration 8 weeks    PT Treatment/Interventions Manual techniques;Neuromuscular re-education;Patient/family education;Therapeutic exercise;Therapeutic activities;Electrical Stimulation;Iontophoresis 4mg /ml Dexamethasone;Dry needling    PT Next Visit Plan posture, scapular, shoulder anterior cervical strengthening, manual techniques, modalities PRN    PT Home Exercise Plan Medbridge Access Code [email or web address], no updates this date    Consulted and Agree with Plan of Care Patient             Patient will benefit from skilled therapeutic intervention in order to improve the following deficits and impairments:  Pain, Improper body mechanics, Postural dysfunction, Impaired UE functional use, Decreased strength, Decreased range of motion  Visit Diagnosis: Cervicalgia  Radiculopathy, cervical region     Problem  List Patient Active Problem List   Diagnosis Date Noted   Cubital tunnel syndrome, right 07/16/2020   Meralgia paraesthetica, left 07/16/2020   Ulnar neuropathy at elbow, right 07/16/2020   Numbness and tingling 06/10/2020   Encounter for long-term (current) use of high-risk medication 06/02/2020   LFT elevation 06/02/2020   Osteoarthritis of spine with radiculopathy, cervical region 06/02/2020   Primary osteoarthritis involving multiple joints 06/02/2020   Rheumatoid arthritis of multiple sites with negative rheumatoid factor ([employer]) 06/02/2020   Acute posthemorrhagic anemia 03/27/2020   Hip pain 03/27/2020   Osteoarthritis 03/27/2020   Sinus tachycardia 03/27/2020   History of total hip arthroplasty 12/18/2019   Proteinuria, unspecified 10/10/2019   Microcalcification of left breast on mammogram 05/03/2017   Herniated intervertebral disc of lumbar spine 09/02/2016   Spondylolisthesis of lumbar region 09/02/2016   Controlled type 2 diabetes mellitus with microalbuminuria, without long-term current use of insulin ([place]) 02/24/2015   Vitamin D deficiency 02/24/2015   Perennial allergic rhinitis 02/24/2015   Hypercalcemia 02/24/2015   GERD without esophagitis 02/24/2015   Hypertension, benign 02/24/2015   Controlled gout 02/24/2015   Fatty liver 02/24/2015   Callus of foot 02/24/2015   Primary osteoarthritis of right hip 02/24/2015   History of carpal tunnel syndrome 02/24/2015   Obesity (BMI 30-39.9) 82/50/5397   Umbilical hernia without obstruction or gangrene 02/24/2015   Intermittent low back pain 02/24/2015   History of colonic polyps 02/24/2015   Dyslipidemia 02/24/2015    Joneen Boers PT, DPT   04/30/2021, 3:53 PM   [facility] [address],  [place], [address] Phone: [phone]   Fax:  [phone]  Name: LORIN HAUCK MRN: [id number] Date of Birth: 18-Nov-1955

## 2021-05-05 ENCOUNTER — Ambulatory Visit: Payer: Managed Care, Other (non HMO)

## 2021-05-05 DIAGNOSIS — M542 Cervicalgia: Secondary | ICD-10-CM | POA: Diagnosis not present

## 2021-05-05 DIAGNOSIS — M5412 Radiculopathy, cervical region: Secondary | ICD-10-CM

## 2021-05-05 NOTE — Therapy (Signed)
[facility] [address] [place], [address] Phone: [phone]   Fax:  [phone]  Physical Therapy Treatment  Patient Details  Name: Maria Cruz MRN: [id number] Date of Birth: May 05, 1956 Referring Provider (PT): Terrilee Croak, MD   Encounter Date: 05/05/2021   PT End of Session - 05/05/21 0806     Visit Number 17    Number of Visits 17    Date for PT Re-Evaluation 05/07/21    Authorization Type [employer] Managed    Authorization Time Period Cert 12/16/54-3/87/56    Progress Note Due on Visit 20    PT Start Time 0807    PT Stop Time 0845    PT Time Calculation (min) 38 min    Activity Tolerance Patient tolerated treatment well;No increased pain    Behavior During Therapy [facility] for tasks assessed/performed             Past Medical History:  Diagnosis Date   Allergy    Arthritis    Back pain with radiation    Calluse    Foot   Colon polyp    Diabetes mellitus without complication ([place]) 4332   Type II    Fatty liver    GERD (gastroesophageal reflux disease)    History of total hip replacement, left 12/05/2019   [facility]   Hyperlipidemia    Hypertension    Lumbar radiculopathy    Dr. Mack Guise   Obesity    Plantar fasciitis, left    Vitamin D deficiency     Past Surgical History:  Procedure Laterality Date   ABDOMINAL HYSTERECTOMY  2012   ANTERIOR INTEROSSEOUS NERVE DECOMPRESSION Right 12/16/2020   Procedure: Right ulnar nerve release, elbow;  Surgeon: Hessie Knows, MD;  Location: [facility] ORS;  Service: Orthopedics;  Laterality: Right;   APPENDECTOMY  2012   BREAST BIOPSY Right 07/12/2012   Negative   BREAST BIOPSY Left 05/09/2017   Affirm Bx of two areas-FIBROADENOMA WITH COARSE CALCIFICATION. NEGATIVE FOR ATYPIA,COLLAPSED CYST AND FIBROADENOMATOID CHANGE   COLONOSCOPY  2009   Dr Allen Norris   COLONOSCOPY WITH PROPOFOL N/A 07/27/2017   Procedure:  COLONOSCOPY WITH PROPOFOL;  Surgeon: Robert Bellow, MD;  Location: [facility] ENDOSCOPY;  Service: Endoscopy;  Laterality: N/A;   DILATION AND CURETTAGE OF UTERUS  2007   TUBAL LIGATION      There were no vitals filed for this visit.   Subjective Assessment - 05/05/21 0809     Subjective R shoulder/arm is doing better. 3/10 currently.    Pertinent History R shoulder pain. Pain began since April 2021. Pt had L hip surgery and feels like lying on her R side might have bothered her R shoulder, arm, and elbow. Had R ulnar nerve release last month and participated in OT for surgical rehab. Pain as worsened since onset. Has not yet had PT for R shoulder. Pt also feels like her grip is off. Still has swelling at 4th and 5th digits.    Currently in Pain? Yes    Pain Score 3                                   Objective   No latex allergies Blood pressure is controlled per pt. DM controlled per pt     Medbridge Access Code [email or web address]      Therapeutic exercise   Scaption P/ROM BUE  stretch on chest press 3x30sec   Wall push ups with plus 10x3  R first rib stretch 30 seconds x 4             Standing B shoulder extension with scapular retraction red band 10x5 seconds for 3 sets to improve posterior deltoid muscle strength  Bent over triceps extension 3 lbs   R 10x3  Seated manually resisted R triceps extension isometrics in 90 degrees flexion              10x5 seconds for 3 sets             Decreased R biceps muscle knot palpated  Seated manually resisted scapular depression isometrics in neutral 10x5 seconds for 2 sets    Improved exercise technique, movement at target joints, use of target muscles after mod verbal, visual, tactile cues.     Response to treatment Decreased arm pain after session.    Clinical impression Continued working on improving R posterior arm strength to decrease anterior arm muscle tension. Decreased R arm pain after session. Pt  will benefit from continued skilled physical therapy services to decrease pain, improve strength, ROM, and function.    PT Short Term Goals - 04/09/21 1023       PT SHORT TERM GOAL #1   Title Pt will be independent with her initial HEP to decrease pain, improve strength and ability to raise her R arm and reach more comfortably.    Baseline Pt has started her HEP (03/10/2021)    Time 3    Period Weeks    Status Achieved    Target Date 04/02/21               PT Long Term Goals - 04/09/21 1023       PT LONG TERM GOAL #1   Title Pt will have a decrease in R arm pain to 5/10 or less at worst to promote ability to raise her R arm up, reach, as well as bend at her elbow more comfortably.    Baseline 9/10 R arm pain at most for the past 3 months (03/10/2021); 8/30: 8/10 at worst; 04/09/21 6/10    Time 8    Period Weeks    Status On-going    Target Date 05/07/21      PT LONG TERM GOAL #2   Title Pt will improve R shoulder flexion and abduction AROM by at least 20 degrees to promote ability to raise her arm up more comfortably.    Baseline R shoulder AROM flexion 113 degrees, abduction 92 degrees (03/10/2021); 8/30: Achieved in abduction, flexion unchanged    Time 8    Period Weeks    Status On-going    Target Date 05/07/21      PT LONG TERM GOAL #3   Title Pt will improve her FOTO score by at least 10 points as a demonstration of improved function.    Baseline Shoulder FOTO 50 (03/10/2021); 04/07/21: 55    Time 8    Period Weeks    Status On-going    Target Date 05/07/21                   Plan - 05/05/21 3419     Clinical Impression Statement Continued working on improving R posterior arm strength to decrease anterior arm muscle tension. Decreased R arm pain after session. Pt will benefit from continued skilled physical therapy services to decrease pain, improve strength, ROM, and function.  Personal Factors and Comorbidities Age;Comorbidity 2;Fitness;Past/Current  Experience;Time since onset of injury/illness/exacerbation    Comorbidities DM, HTN    Examination-Activity Limitations Lift;Reach Overhead;Toileting;Self Feeding;Carry;Sleep    Stability/Clinical Decision Making Stable/Uncomplicated    Clinical Decision Making Low    Rehab Potential Poor    PT Frequency 2x / week    PT Duration 8 weeks    PT Treatment/Interventions Manual techniques;Neuromuscular re-education;Patient/family education;Therapeutic exercise;Therapeutic activities;Electrical Stimulation;Iontophoresis 4mg /ml Dexamethasone;Dry needling    PT Next Visit Plan posture, scapular, shoulder anterior cervical strengthening, manual techniques, modalities PRN    PT Home Exercise Plan Medbridge Access Code [email or web address], no updates this date    Consulted and Agree with Plan of Care Patient             Patient will benefit from skilled therapeutic intervention in order to improve the following deficits and impairments:  Pain, Improper body mechanics, Postural dysfunction, Impaired UE functional use, Decreased strength, Decreased range of motion  Visit Diagnosis: Cervicalgia  Radiculopathy, cervical region     Problem List Patient Active Problem List   Diagnosis Date Noted   Cubital tunnel syndrome, right 07/16/2020   Meralgia paraesthetica, left 07/16/2020   Ulnar neuropathy at elbow, right 07/16/2020   Numbness and tingling 06/10/2020   Encounter for long-term (current) use of high-risk medication 06/02/2020   LFT elevation 06/02/2020   Osteoarthritis of spine with radiculopathy, cervical region 06/02/2020   Primary osteoarthritis involving multiple joints 06/02/2020   Rheumatoid arthritis of multiple sites with negative rheumatoid factor ([place]) 06/02/2020   Acute posthemorrhagic anemia 03/27/2020   Hip pain 03/27/2020   Osteoarthritis 03/27/2020   Sinus tachycardia 03/27/2020   History of total hip arthroplasty 12/18/2019   Proteinuria, unspecified 10/10/2019    Microcalcification of left breast on mammogram 05/03/2017   Herniated intervertebral disc of lumbar spine 09/02/2016   Spondylolisthesis of lumbar region 09/02/2016   Controlled type 2 diabetes mellitus with microalbuminuria, without long-term current use of insulin ([place]) 02/24/2015   Vitamin D deficiency 02/24/2015   Perennial allergic rhinitis 02/24/2015   Hypercalcemia 02/24/2015   GERD without esophagitis 02/24/2015   Hypertension, benign 02/24/2015   Controlled gout 02/24/2015   Fatty liver 02/24/2015   Callus of foot 02/24/2015   Primary osteoarthritis of right hip 02/24/2015   History of carpal tunnel syndrome 02/24/2015   Obesity (BMI 30-39.9) 35/57/3220   Umbilical hernia without obstruction or gangrene 02/24/2015   Intermittent low back pain 02/24/2015   History of colonic polyps 02/24/2015   Dyslipidemia 02/24/2015    Joneen Boers PT, DPT   05/05/2021, 10:59 AM  [facility] [address], [place], [address] Phone: [phone]   Fax:  [phone]  Name: Maria Cruz MRN: [id number] Date of Birth: 29-Aug-1955

## 2021-05-07 ENCOUNTER — Ambulatory Visit: Payer: Managed Care, Other (non HMO)

## 2021-05-07 DIAGNOSIS — M25511 Pain in right shoulder: Secondary | ICD-10-CM

## 2021-05-07 DIAGNOSIS — M542 Cervicalgia: Secondary | ICD-10-CM

## 2021-05-07 DIAGNOSIS — M5412 Radiculopathy, cervical region: Secondary | ICD-10-CM

## 2021-05-07 NOTE — Therapy (Signed)
[facility] [address] [place], [address] Phone: [phone]   Fax:  [phone]  Physical Therapy Treatment Re-certification And Discharge Summary  Patient Details  Name: Maria Cruz MRN: [id number] Date of Birth: 12-16-1955 Referring Provider (PT): Terrilee Croak, MD   Encounter Date: 05/07/2021   PT End of Session - 05/07/21 1500     Visit Number 18    Number of Visits 17    Date for PT Re-Evaluation 05/07/21    Authorization Type [employer] Managed    Authorization Time Period Cert 01/13/66-09/17/45    Progress Note Due on Visit 20    PT Start Time 1501    PT Stop Time 1540    PT Time Calculation (min) 39 min    Activity Tolerance Patient tolerated treatment well;No increased pain    Behavior During Therapy [facility] for tasks assessed/performed             Past Medical History:  Diagnosis Date   Allergy    Arthritis    Back pain with radiation    Calluse    Foot   Colon polyp    Diabetes mellitus without complication ([place]) 0962   Type II    Fatty liver    GERD (gastroesophageal reflux disease)    History of total hip replacement, left 12/05/2019   [facility]   Hyperlipidemia    Hypertension    Lumbar radiculopathy    Dr. Mack Guise   Obesity    Plantar fasciitis, left    Vitamin D deficiency     Past Surgical History:  Procedure Laterality Date   ABDOMINAL HYSTERECTOMY  2012   ANTERIOR INTEROSSEOUS NERVE DECOMPRESSION Right 12/16/2020   Procedure: Right ulnar nerve release, elbow;  Surgeon: Hessie Knows, MD;  Location: [facility] ORS;  Service: Orthopedics;  Laterality: Right;   APPENDECTOMY  2012   BREAST BIOPSY Right 07/12/2012   Negative   BREAST BIOPSY Left 05/09/2017   Affirm Bx of two areas-FIBROADENOMA WITH COARSE CALCIFICATION. NEGATIVE FOR ATYPIA,COLLAPSED CYST AND FIBROADENOMATOID CHANGE   COLONOSCOPY  2009   Dr Allen Norris   COLONOSCOPY WITH  PROPOFOL N/A 07/27/2017   Procedure: COLONOSCOPY WITH PROPOFOL;  Surgeon: Robert Bellow, MD;  Location: [facility] ENDOSCOPY;  Service: Endoscopy;  Laterality: N/A;   DILATION AND CURETTAGE OF UTERUS  2007   TUBAL LIGATION      There were no vitals filed for this visit.   Subjective Assessment - 05/07/21 1502     Subjective Doing a lot better. 2/10 currently. Has been doing her HEP and helps her. Feels like today is a good day to graduate. R grip is doing better.    Pertinent History R shoulder pain. Pain began since April 2021. Pt had L hip surgery and feels like lying on her R side might have bothered her R shoulder, arm, and elbow. Had R ulnar nerve release last month and participated in OT for surgical rehab. Pain as worsened since onset. Has not yet had PT for R shoulder. Pt also feels like her grip is off. Still has swelling at 4th and 5th digits.    Currently in Pain? Yes    Pain Score 2                                         PT Education - 05/07/21 1502  Education Details ther-ex    Person(s) Educated Patient    Methods Explanation;Demonstration;Tactile cues;Verbal cues    Comprehension Returned demonstration;Verbalized understanding            Objective   No latex allergies Blood pressure is controlled per pt. DM controlled per pt     Medbridge Access Code [email or web address]       Therapeutic exercise   Standing shoulder AROM   Flexion 142 degrees,  Abduction 132 degrees  Reviewed progress with PT towards goals   Seated manually resisted R triceps extension isometrics in 90 degrees flexion              10x5 seconds for 3 sets             Decreased R biceps muscle knot palpated  Seated manually resisted triceps extension isometrics in neutral 10x3 with 5 second holds      Seated manually resisted scapular depression isometrics in neutral 10x5 seconds for 2 sets  Bent over triceps extension 3 lbs              R 10x3    With R  caudal pressure to R first rib area with PT, L cervical side bend 30 seconds x 3     Improved exercise technique, movement at target joints, use of target muscles after mod verbal, visual, tactile cues.     Manual therapy  Seated STM R first rib area improved R 5th digit sensation     Response to treatment Decreased arm pain after session.    Clinical impression Pt demonstrates significant decrease in pain as well as improved R shoulder AROM and function since initial evaluation. Pt has made very good progress with PT towards goals and demonstrates consistency with her HEP. Skilled physical therapy services discharged after re-cert for today's visit with pt continuing with her exercises at home.          PT Short Term Goals - 04/09/21 1023       PT SHORT TERM GOAL #1   Title Pt will be independent with her initial HEP to decrease pain, improve strength and ability to raise her R arm and reach more comfortably.    Baseline Pt has started her HEP (03/10/2021)    Time 3    Period Weeks    Status Achieved    Target Date 04/02/21               PT Long Term Goals - 05/07/21 1503       PT LONG TERM GOAL #1   Title Pt will have a decrease in R arm pain to 5/10 or less at worst to promote ability to raise her R arm up, reach, as well as bend at her elbow more comfortably.    Baseline 9/10 R arm pain at most for the past 3 months (03/10/2021); 8/30: 8/10 at worst; 04/09/21 6/10; 4/10 at worst for the past 7 days (05/07/2021)    Time 8    Period Weeks    Status Achieved    Target Date 05/07/21      PT LONG TERM GOAL #2   Title Pt will improve R shoulder flexion and abduction AROM by at least 20 degrees to promote ability to raise her arm up more comfortably.    Baseline R shoulder AROM flexion 113 degrees, abduction 92 degrees (03/10/2021); 8/30: Achieved in abduction, flexion unchanged; flexion 142 degrees, abduction 132 degrees (05/07/2021)    Time 8    Period  Weeks    Status  Achieved    Target Date 05/07/21      PT LONG TERM GOAL #3   Title Pt will improve her FOTO score by at least 10 points as a demonstration of improved function.    Baseline Shoulder FOTO 50 (03/10/2021); 04/07/21: 55; 55 (05/07/2021)    Time 8    Period Weeks    Status Partially Met    Target Date 05/07/21                   Plan - 05/07/21 1459     Clinical Impression Statement Pt demonstrates significant decrease in pain as well as improved R shoulder AROM and function since initial evaluation. Pt has made very good progress with PT towards goals and demonstrates consistency with her HEP. Skilled physical therapy services discharged after re-cert for today's visit with pt continuing with her exercises at home.    Personal Factors and Comorbidities Age;Comorbidity 2;Fitness;Past/Current Experience;Time since onset of injury/illness/exacerbation    Comorbidities DM, HTN    Examination-Activity Limitations Lift;Reach Overhead;Toileting;Self Feeding;Carry;Sleep    Stability/Clinical Decision Making Stable/Uncomplicated    Rehab Potential --    PT Frequency --    PT Duration --    PT Treatment/Interventions Manual techniques;Neuromuscular re-education;Patient/family education;Therapeutic exercise;Therapeutic activities    PT Next Visit Plan Continue progress with her HEP    PT Home Exercise Plan Medbridge Access Code [email or web address]    Consulted and Agree with Plan of Care Patient             Patient will benefit from skilled therapeutic intervention in order to improve the following deficits and impairments:  Pain, Improper body mechanics, Postural dysfunction, Impaired UE functional use, Decreased strength, Decreased range of motion  Visit Diagnosis: Cervicalgia - Plan: PT plan of care cert/re-cert  Radiculopathy, cervical region - Plan: PT plan of care cert/re-cert  Pain in joint of right shoulder - Plan: PT plan of care cert/re-cert     Problem List Patient Active Problem  List   Diagnosis Date Noted   Cubital tunnel syndrome, right 07/16/2020   Meralgia paraesthetica, left 07/16/2020   Ulnar neuropathy at elbow, right 07/16/2020   Numbness and tingling 06/10/2020   Encounter for long-term (current) use of high-risk medication 06/02/2020   LFT elevation 06/02/2020   Osteoarthritis of spine with radiculopathy, cervical region 06/02/2020   Primary osteoarthritis involving multiple joints 06/02/2020   Rheumatoid arthritis of multiple sites with negative rheumatoid factor ([place]) 06/02/2020   Acute posthemorrhagic anemia 03/27/2020   Hip pain 03/27/2020   Osteoarthritis 03/27/2020   Sinus tachycardia 03/27/2020   History of total hip arthroplasty 12/18/2019   Proteinuria, unspecified 10/10/2019   Microcalcification of left breast on mammogram 05/03/2017   Herniated intervertebral disc of lumbar spine 09/02/2016   Spondylolisthesis of lumbar region 09/02/2016   Controlled type 2 diabetes mellitus with microalbuminuria, without long-term current use of insulin ([place]) 02/24/2015   Vitamin D deficiency 02/24/2015   Perennial allergic rhinitis 02/24/2015   Hypercalcemia 02/24/2015   GERD without esophagitis 02/24/2015   Hypertension, benign 02/24/2015   Controlled gout 02/24/2015   Fatty liver 02/24/2015   Callus of foot 02/24/2015   Primary osteoarthritis of right hip 02/24/2015   History of carpal tunnel syndrome 02/24/2015   Obesity (BMI 30-39.9) 09/81/1914   Umbilical hernia without obstruction or gangrene 02/24/2015   Intermittent low back pain 02/24/2015   History of colonic polyps 02/24/2015   Dyslipidemia 02/24/2015    Thank you for  your referral.   Joneen Boers PT, DPT   05/07/2021, 3:57 PM  [facility] [address], [place], [address] Phone: [phone]   Fax:  [phone]  Name: EVERLY RUBALCAVA MRN: [id number] Date of Birth: June 09, 1956

## 2021-05-07 NOTE — Patient Instructions (Signed)
Access Code: [email or web address] URL: [email or web address] Date: 05/07/2021 Prepared by: Joneen Boers  Exercises Seated Cervical Retraction - 1 x daily - 7 x weekly - 3 sets - 10 reps - 5 seconds hold Seated Scapular Retraction - 1 x daily - 7 x weekly - 3 sets - 10 reps - 5 seconds hold First Rib Mobilization with Strap - 3 x daily - 7 x weekly - 1 sets - 3 reps - 30 seconds hold Shoulder Abduction with Dumbbells - Palms Down - 1 x daily - 7 x weekly - 2 sets - 15 reps Tricep Push Up on Wall - 1 x daily - 7 x weekly - 3 sets - 10 reps Scapular Retraction with Resistance - 1 x daily - 7 x weekly - 3 sets - 10 reps - 5 seconds hold First Rib Mobilization with Strap - 3 x daily - 7 x weekly - 1 sets - 3 reps - 30 seconds hold Single Arm Shoulder Extension with Anchored Resistance - 1 x daily - 7 x weekly - 3 sets - 10 reps - 5 seconds hold Supine Single Arm Shoulder Protraction - 1 x daily - 7 x weekly - 3 sets - 10 reps - 5 seconds hold Bent Over Tricep Extension with Counter Support - 1 x daily - 7 x weekly - 3 sets - 10 reps

## 2021-05-11 ENCOUNTER — Other Ambulatory Visit: Payer: Self-pay | Admitting: Family Medicine

## 2021-05-11 DIAGNOSIS — I10 Essential (primary) hypertension: Secondary | ICD-10-CM

## 2021-05-13 ENCOUNTER — Ambulatory Visit: Payer: Managed Care, Other (non HMO)

## 2021-05-29 ENCOUNTER — Ambulatory Visit (LOCAL_COMMUNITY_HEALTH_CENTER): Payer: Self-pay

## 2021-05-29 ENCOUNTER — Other Ambulatory Visit: Payer: Self-pay

## 2021-05-29 DIAGNOSIS — Z111 Encounter for screening for respiratory tuberculosis: Secondary | ICD-10-CM

## 2021-06-01 ENCOUNTER — Ambulatory Visit (LOCAL_COMMUNITY_HEALTH_CENTER): Payer: Managed Care, Other (non HMO)

## 2021-06-01 ENCOUNTER — Other Ambulatory Visit: Payer: Self-pay

## 2021-06-01 DIAGNOSIS — Z111 Encounter for screening for respiratory tuberculosis: Secondary | ICD-10-CM

## 2021-06-01 LAB — TB SKIN TEST
Induration: 0 mm
TB Skin Test: NEGATIVE

## 2021-06-03 ENCOUNTER — Other Ambulatory Visit: Payer: Self-pay

## 2021-06-03 ENCOUNTER — Encounter: Payer: Self-pay | Admitting: Family Medicine

## 2021-06-03 ENCOUNTER — Ambulatory Visit (INDEPENDENT_AMBULATORY_CARE_PROVIDER_SITE_OTHER): Payer: Managed Care, Other (non HMO) | Admitting: Family Medicine

## 2021-06-03 VITALS — BP 128/78 | HR 95 | Temp 98.0°F | Resp 16 | Ht 67.0 in | Wt 220.0 lb

## 2021-06-03 DIAGNOSIS — B3731 Acute candidiasis of vulva and vagina: Secondary | ICD-10-CM

## 2021-06-03 DIAGNOSIS — E1169 Type 2 diabetes mellitus with other specified complication: Secondary | ICD-10-CM | POA: Diagnosis not present

## 2021-06-03 DIAGNOSIS — M0609 Rheumatoid arthritis without rheumatoid factor, multiple sites: Secondary | ICD-10-CM

## 2021-06-03 DIAGNOSIS — E669 Obesity, unspecified: Secondary | ICD-10-CM | POA: Diagnosis not present

## 2021-06-03 DIAGNOSIS — E559 Vitamin D deficiency, unspecified: Secondary | ICD-10-CM

## 2021-06-03 DIAGNOSIS — M109 Gout, unspecified: Secondary | ICD-10-CM

## 2021-06-03 DIAGNOSIS — Z23 Encounter for immunization: Secondary | ICD-10-CM

## 2021-06-03 DIAGNOSIS — I10 Essential (primary) hypertension: Secondary | ICD-10-CM | POA: Diagnosis not present

## 2021-06-03 DIAGNOSIS — E1129 Type 2 diabetes mellitus with other diabetic kidney complication: Secondary | ICD-10-CM

## 2021-06-03 DIAGNOSIS — E785 Hyperlipidemia, unspecified: Secondary | ICD-10-CM

## 2021-06-03 DIAGNOSIS — R809 Proteinuria, unspecified: Secondary | ICD-10-CM

## 2021-06-03 DIAGNOSIS — J3089 Other allergic rhinitis: Secondary | ICD-10-CM

## 2021-06-03 LAB — POCT GLYCOSYLATED HEMOGLOBIN (HGB A1C): Hemoglobin A1C: 6.5 % — AB (ref 4.0–5.6)

## 2021-06-03 MED ORDER — XIGDUO XR 10-1000 MG PO TB24: 1.0000 | ORAL_TABLET | Freq: Every day | ORAL | 1 refills | Status: DC

## 2021-06-03 MED ORDER — FLUCONAZOLE 150 MG PO TABS
ORAL_TABLET | ORAL | 0 refills | Status: DC
Start: 2021-06-03 — End: 2021-07-31

## 2021-06-03 MED ORDER — AMLODIPINE-OLMESARTAN 5-40 MG PO TABS: 1.0000 | ORAL_TABLET | Freq: Every day | ORAL | 0 refills | Status: DC

## 2021-06-03 MED ORDER — ROSUVASTATIN CALCIUM 40 MG PO TABS: 40.0000 mg | ORAL_TABLET | Freq: Every evening | ORAL | 1 refills | Status: DC

## 2021-06-03 MED ORDER — OZEMPIC (1 MG/DOSE) 4 MG/3ML ~~LOC~~ SOPN: 1.0000 mg | PEN_INJECTOR | SUBCUTANEOUS | 1 refills | Status: DC

## 2021-06-03 MED ORDER — MONTELUKAST SODIUM 10 MG PO TABS: 10.0000 mg | ORAL_TABLET | Freq: Every day | ORAL | 1 refills | Status: DC

## 2021-06-03 NOTE — Progress Notes (Signed)
Name: Maria Cruz   MRN: [id number]    DOB: 1956/04/05   Date:06/03/2021       Progress Note  Subjective  Chief Complaint  Follow Up  HPI  DM II with proteinuria: urine micro has been elevated since 2019, under the care of Dr. Abigail Butts since 2021. She is on  ARB/CCB, Synjardi , Ozempic  A1C has been controlled with current regiment, she has recurrent vaginitis and is running out of Diflucan. We will switch to [facility] since we have a zero copay voucher and take it once daily. We will rx diflucan to take twice weekly , recommended to stay on SGL2 agonist since she has macroalbuminuria.   She has dyslipidemia and takes Rosuvastatin and Lovaza, she also has microalbuminuria and is on ARB. She denies polyphagia, polydipsia and polyuria. A1C today stable at 6.5 %   Hyperlipidemia: Taking Lovaza and Rosuvastatin and LDL , last LDL was at  goal - 64, continue medications. Denies myopathy    HTN: She is on AZOR for now ( Norvasc plus Benicar ) Denies chest pain, palpitation or dizziness. She has DM also and proteinuria, under the care of nephrologist. BP is at goal , she had tachycardia when she arrived but it improved with rest. She is also exercising at the [facility]   Gout: no recent episodes, taking Allopurinol daily, denies side effects. She has been compliant with medication . Unchanged    Right 5 th finger pain: she was seen by  neurologist and ortho, had right ulnar nerve release and is feeling better, still taking Lyrica and is still doing the exercises that PT recommended    History of  left hip replacement on 12/05/2019 by Dr. Harlow Mares, she is back on PT, she states it has been helping with pain on left outer thigh  BP:ZWCHENIDP in 2021, seeing Dr. Posey Pronto, could not take methotrexate because of elevation of liver enzymes,  she has been taking plaquenil, since she retired March 2020 she seems to be doing better since retirement   Patient Active Problem List   Diagnosis Date Noted    Cubital tunnel syndrome, right 07/16/2020   Meralgia paraesthetica, left 07/16/2020   Ulnar neuropathy at elbow, right 07/16/2020   Numbness and tingling 06/10/2020   Encounter for long-term (current) use of high-risk medication 06/02/2020   LFT elevation 06/02/2020   Osteoarthritis of spine with radiculopathy, cervical region 06/02/2020   Primary osteoarthritis involving multiple joints 06/02/2020   Rheumatoid arthritis of multiple sites with negative rheumatoid factor ([place]) 06/02/2020   Acute posthemorrhagic anemia 03/27/2020   Hip pain 03/27/2020   Osteoarthritis 03/27/2020   Sinus tachycardia 03/27/2020   History of total hip arthroplasty 12/18/2019   Proteinuria, unspecified 10/10/2019   Microcalcification of left breast on mammogram 05/03/2017   Herniated intervertebral disc of lumbar spine 09/02/2016   Spondylolisthesis of lumbar region 09/02/2016   Type 2 diabetes mellitus with albuminuria ([place]) 02/24/2015   Vitamin D deficiency 02/24/2015   Perennial allergic rhinitis 02/24/2015   Hypercalcemia 02/24/2015   GERD without esophagitis 02/24/2015   Hypertension, benign 02/24/2015   Controlled gout 02/24/2015   Fatty liver 02/24/2015   Callus of foot 02/24/2015   Primary osteoarthritis of right hip 02/24/2015   History of carpal tunnel syndrome 02/24/2015   Obesity (BMI 30-39.9) 82/42/3536   Umbilical hernia without obstruction or gangrene 02/24/2015   Intermittent low back pain 02/24/2015   History of colonic polyps 02/24/2015   Dyslipidemia 02/24/2015    Past Surgical History:  Procedure Laterality Date   ABDOMINAL HYSTERECTOMY  2012   ANTERIOR INTEROSSEOUS NERVE DECOMPRESSION Right 12/16/2020   Procedure: Right ulnar nerve release, elbow;  Surgeon: Hessie Knows, MD;  Location: [facility] ORS;  Service: Orthopedics;  Laterality: Right;   APPENDECTOMY  2012   BREAST BIOPSY Right 07/12/2012   Negative   BREAST BIOPSY Left 05/09/2017   Affirm Bx of two areas-FIBROADENOMA WITH  COARSE CALCIFICATION. NEGATIVE FOR ATYPIA,COLLAPSED CYST AND FIBROADENOMATOID CHANGE   COLONOSCOPY  2009   Dr Allen Norris   COLONOSCOPY WITH PROPOFOL N/A 07/27/2017   Procedure: COLONOSCOPY WITH PROPOFOL;  Surgeon: Robert Bellow, MD;  Location: [facility] ENDOSCOPY;  Service: Endoscopy;  Laterality: N/A;   DILATION AND CURETTAGE OF UTERUS  2007   TUBAL LIGATION      Family History  Problem Relation Age of Onset   Multiple sclerosis Mother    Heart attack Father    Kidney failure Sister    Heart attack Brother    Diabetes Daughter    CAD Sister    Arthritis Sister        RA   Lupus Sister    Breast cancer Neg Hx    Colon cancer Neg Hx     Social History   Tobacco Use   Smoking status: Never   Smokeless tobacco: Never  Substance Use Topics   Alcohol use: No    Alcohol/week: 0.0 standard drinks     Current Outpatient Medications:    acetaminophen (TYLENOL) 500 MG tablet, Take 1 tablet (500 mg total) by mouth every 6 (six) hours as needed. (Patient taking differently: Take 500 mg by mouth 2 (two) times daily as needed for moderate pain.), Disp: 90 tablet, Rfl: 0   allopurinol (ZYLOPRIM) 300 MG tablet, TAKE 1 TABLET(300 MG) BY MOUTH TWICE DAILY (Patient taking differently: Take 300 mg by mouth 2 (two) times daily.), Disp: 180 tablet, Rfl: 3   aspirin 81 MG chewable tablet, Chew 81 mg by mouth daily., Disp: , Rfl:    blood glucose meter kit and supplies, Dispense based on patient and insurance preference. Use up to four times daily as directed. (FOR ICD-10 E10.9, E11.9). Check fsbs up to twice daily, Disp: 1 each, Rfl: 0   Blood Glucose Monitoring Suppl (FREESTYLE LITE) DEVI, 1 each by Does not apply route daily., Disp: 1 each, Rfl: 0   Certolizumab Pegol 2 X 200 MG/ML KIT, Inject 1 each into the skin every 30 (thirty) days., Disp: , Rfl:    Cholecalciferol 25 MCG (1000 UT) tablet, Take 1,000 Units by mouth in the morning and at bedtime., Disp: , Rfl:    Coenzyme Q10 (COQ10) 100 MG CAPS,  Take 100 mg by mouth 2 (two) times daily., Disp: , Rfl:    diclofenac Sodium (VOLTAREN) 1 % GEL, Apply 2 g topically 4 (four) times daily., Disp: 100 g, Rfl: 2   fluconazole (DIFLUCAN) 150 MG tablet, TAKE 1 TABLET(150 MG TOTAL) BY MOUTH EVERY 3RD DAY AS NEEDED FOR YEAST, Disp: 9 tablet, Rfl: 0   fluticasone (FLONASE) 50 MCG/ACT nasal spray, SHAKE LIQUID AND USE 2 SPRAYS IN EACH NOSTRIL DAILY, Disp: 48 g, Rfl: 0   hydroxychloroquine (PLAQUENIL) 200 MG tablet, Take 200 mg by mouth 2 (two) times daily., Disp: , Rfl:    Lancets (ONETOUCH DELICA PLUS [id number]) MISC, TEST UP TO FOUR TIMES DAILY, Disp: 100 each, Rfl: 11   levocetirizine (XYZAL) 5 MG tablet, Take 1 tablet (5 mg total) by mouth every evening., Disp: 90 tablet, Rfl:  1   Multiple Vitamin (MULTIVITAMIN WITH MINERALS) TABS tablet, Take 1 tablet by mouth daily., Disp: , Rfl:    omega-3 acid ethyl esters (LOVAZA) 1 g capsule, TAKE 2 CAPSULES BY MOUTH TWICE DAILY, Disp: 360 capsule, Rfl: 2   ONETOUCH ULTRA test strip, TEST UP TO FOUR TIMES DAILY, Disp: 300 strip, Rfl: 1   polyethylene glycol powder (GLYCOLAX/MIRALAX) 17 GM/SCOOP powder, Take 17 g by mouth daily as needed for mild constipation or moderate constipation., Disp: 850 g, Rfl: 0   pregabalin (LYRICA) 75 MG capsule, Take 1-2 capsules (75-150 mg total) by mouth 2 (two) times daily. Taking 37m in AM, 154min PM, Disp: 270 capsule, Rfl: 1   amLODipine-olmesartan (AZOR) 5-40 MG tablet, Take 1 tablet by mouth daily., Disp: 90 tablet, Rfl: 0   Dapagliflozin-metFORMIN HCl ER (XIGDUO XR) 05-999 MG TB24, Take 1 tablet by mouth daily., Disp: 90 tablet, Rfl: 1   montelukast (SINGULAIR) 10 MG tablet, Take 1 tablet (10 mg total) by mouth at bedtime., Disp: 90 tablet, Rfl: 1   rosuvastatin (CRESTOR) 40 MG tablet, Take 1 tablet (40 mg total) by mouth every evening., Disp: 90 tablet, Rfl: 1   Semaglutide, 1 MG/DOSE, (OZEMPIC, 1 MG/DOSE,) 4 MG/3ML SOPN, Inject 1 mg into the skin once a week., Disp: 9  mL, Rfl: 1  Allergies  Allergen Reactions   Ace Inhibitors Cough   Lipofen [Fenofibrate] Other (See Comments)    Localized drug reaction, blister on back   Penicillins Itching   Sulfa Antibiotics Itching    I personally reviewed active problem list, medication list, allergies, family history, social history, health maintenance with the patient/caregiver today.   ROS  Constitutional: Negative for fever or weight change.  Respiratory: Negative for cough and shortness of breath.   Cardiovascular: Negative for chest pain or palpitations.  Gastrointestinal: Negative for abdominal pain, no bowel changes.  Musculoskeletal: Negative for gait problem or joint swelling.  Skin: Negative for rash.  Neurological: Negative for dizziness or headache.  No other specific complaints in a complete review of systems (except as listed in HPI above).   Objective  Vitals:   06/03/21 0937  BP: 128/78  Pulse: 95  Resp: 16  Temp: 98 F (36.7 C)  SpO2: 98%  Weight: 220 lb (99.8 kg)  Height: _0  (1.702 m)    Body mass index is 34.46 kg/m.  Physical Exam  Constitutional: Patient appears well-developed and well-nourished. Obese  No distress.  HEENT: head atraumatic, normocephalic, pupils equal and reactive to light, neck supple Cardiovascular: Normal rate, regular rhythm and normal heart sounds.  No murmur heard. No BLE edema. Pulmonary/Chest: Effort normal and breath sounds normal. No respiratory distress. Abdominal: Soft.  There is no tenderness. Psychiatric: Patient has a normal mood and affect. behavior is normal. Judgment and thought content normal.   Recent Results (from the past 2160 hour(s))  TB Skin Test     Status: Normal   Collection Time: 06/01/21 10:33 AM  Result Value Ref Range   TB Skin Test Negative    Induration 0 mm    Comment: Negative  POCT HgB A1C     Status: Abnormal   Collection Time: 06/03/21  9:47 AM  Result Value Ref Range   Hemoglobin A1C 6.5 (A) 4.0 - 5.6  %   HbA1c POC (<> result, manual entry)     HbA1c, POC (prediabetic range)     HbA1c, POC (controlled diabetic range)       PHQ2/9: Depression screen  [facility] 2/9 06/03/2021 03/25/2021 02/02/2021 09/25/2020 06/30/2020  Decreased Interest 0 0 0 0 0  Down, Depressed, Hopeless 0 0 0 0 0  PHQ - 2 Score 0 0 0 0 0  Altered sleeping 0 - - - -  Tired, decreased energy 0 - - - -  Change in appetite 0 - - - -  Feeling bad or failure about yourself  0 - - - -  Trouble concentrating 0 - - - -  Moving slowly or fidgety/restless 0 - - - -  Suicidal thoughts 0 - - - -  PHQ-9 Score 0 - - - -  Difficult doing work/chores - - - - -  Some recent data might be hidden    phq 9 is negative   Fall Risk: Fall Risk  06/03/2021 03/25/2021 02/02/2021 09/25/2020 06/30/2020  Falls in the past year? 0 0 0 0 0  Number falls in past yr: 0 0 0 0 0  Injury with Fall? 0 0 0 0 0  Risk for fall due to : No Fall Risks - - - -  Follow up Falls prevention discussed Falls evaluation completed - - -     Assessment & Plan  1. Dyslipidemia associated with type 2 diabetes mellitus (HCC)  - Lipid Profile - rosuvastatin (CRESTOR) 40 MG tablet; Take 1 tablet (40 mg total) by mouth every evening.  Dispense: 90 tablet; Refill: 1  2. Need for immunization against influenza  - Flu Vaccine QUAD 6+ mos PF IM (Fluarix Quad PF)  3. Vitamin D deficiency  Continue  supplementation   4. Hypertension, benign  - amLODipine-olmesartan (AZOR) 5-40 MG tablet; Take 1 tablet by mouth daily.  Dispense: 90 tablet; Refill: 0  5. Rheumatoid arthritis of multiple sites with negative rheumatoid factor ([place])  Taking monthly medication and also daily medication   6. Diabetes mellitus type 2 in obese (HCC)  - POCT HgB A1C - Semaglutide, 1 MG/DOSE, (OZEMPIC, 1 MG/DOSE,) 4 MG/3ML SOPN; Inject 1 mg into the skin once a week.  Dispense: 9 mL; Refill: 1  7. Perennial allergic rhinitis  - montelukast (SINGULAIR) 10 MG tablet; Take 1 tablet  (10 mg total) by mouth at bedtime.  Dispense: 90 tablet; Refill: 1  8. Controlled gout   9. Dyslipidemia  - Lipid Profile - montelukast (SINGULAIR) 10 MG tablet; Take 1 tablet (10 mg total) by mouth at bedtime.  Dispense: 90 tablet; Refill: 1  10. Type 2 diabetes mellitus with albuminuria (HCC)   Continue follow up with Dr. Abigail Butts  11. Need for shingles vaccine  - Varicella-zoster vaccine IM

## 2021-06-04 LAB — LIPID PANEL
Chol/HDL Ratio: 2.4 ratio (ref 0.0–4.4)
Cholesterol, Total: 139 mg/dL (ref 100–199)
HDL: 57 mg/dL (ref >39)
LDL Chol Calc (NIH): 53 mg/dL (ref 0–99)
Triglycerides: 174 mg/dL — ABNORMAL HIGH (ref 0–149)
VLDL Cholesterol Cal: 29 mg/dL (ref 5–40)

## 2021-06-20 IMAGING — MG MM DIGITAL SCREENING BILAT W/ TOMO AND CAD
8 of 14 series · 8 of 40 positions shown · non-contrast
Comparison: Previous exam(s).

CLINICAL DATA: Screening.

EXAM:
DIGITAL SCREENING BILATERAL MAMMOGRAM WITH TOMOSYNTHESIS AND CAD
TECHNIQUE: Bilateral screening digital craniocaudal and mediolateral oblique
mammograms were obtained. Bilateral screening digital breast
tomosynthesis was performed. The images were evaluated with
computer-aided detection.

[L CC synth-2D (1 of 2)]
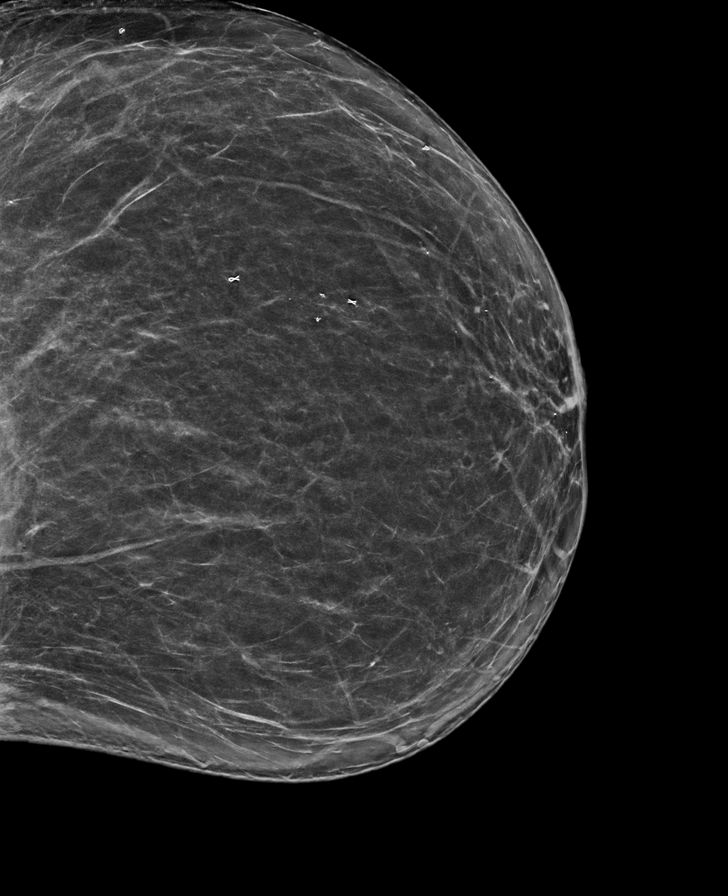

[R CC synth-2D]
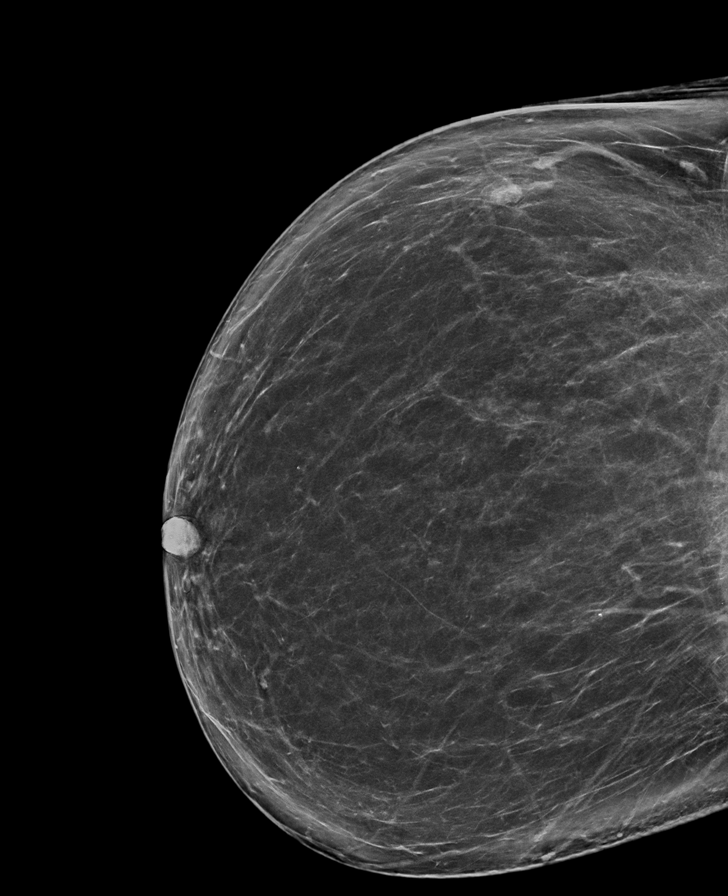

[L MLO synth-2D (1 of 2)]
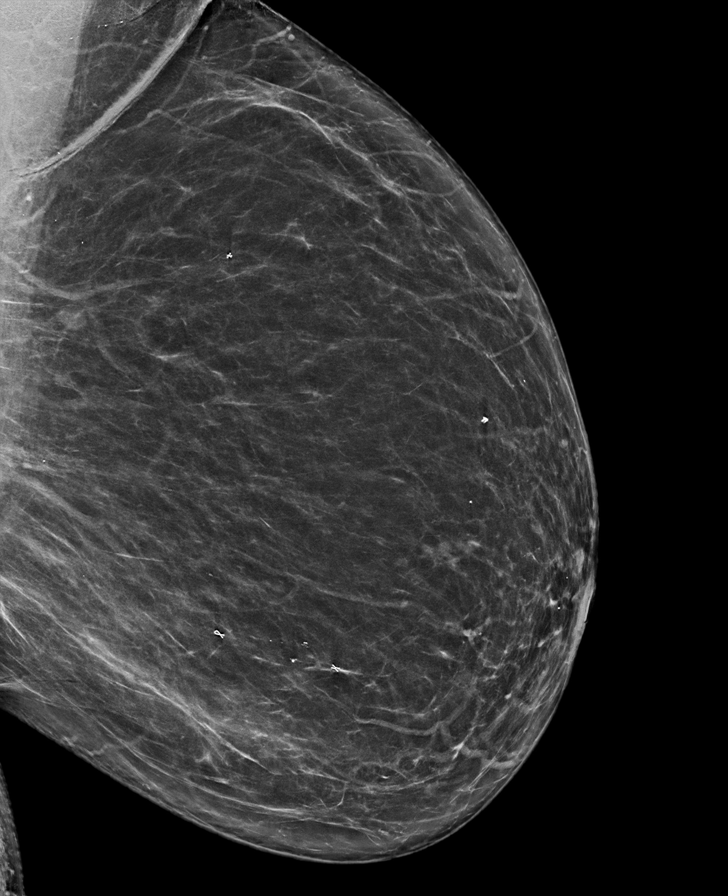

[R MLO synth-2D (1 of 2)]
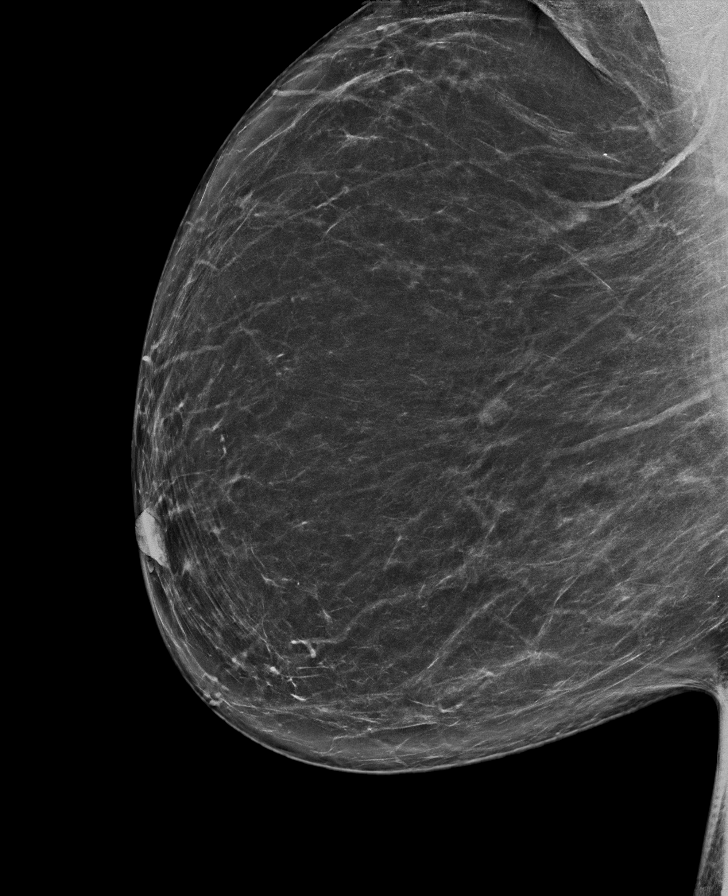

[L MLO synth-2D (2 of 2)]
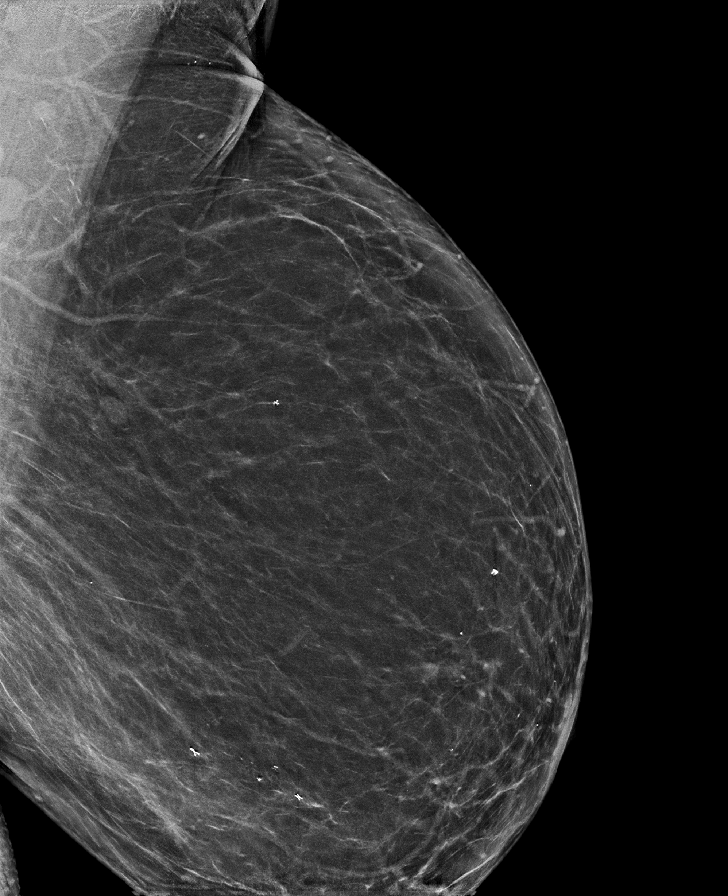

[R MLO synth-2D (2 of 2)]
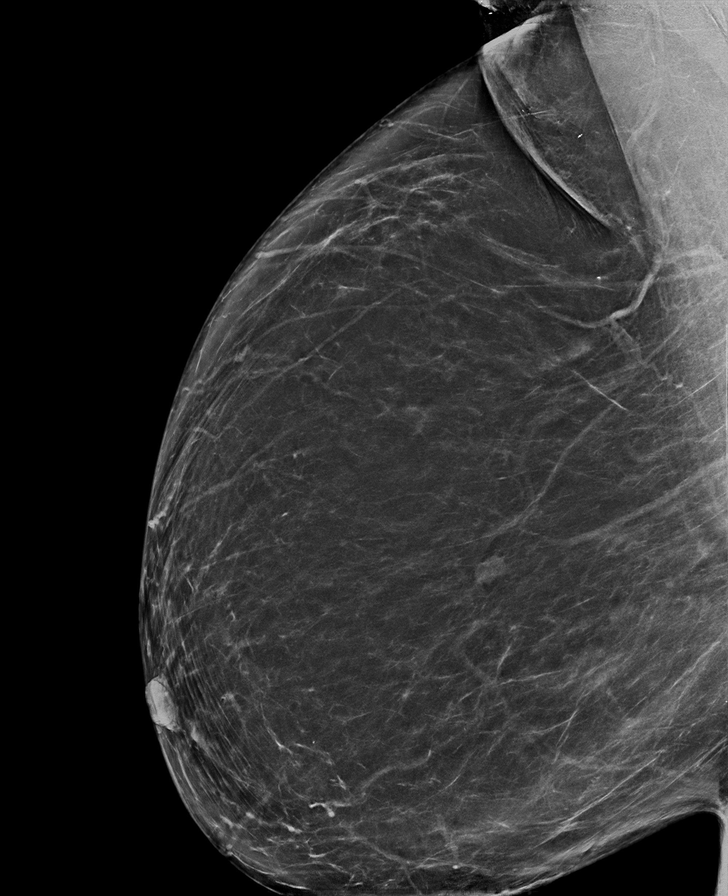

[L CC synth-2D (2 of 2)]
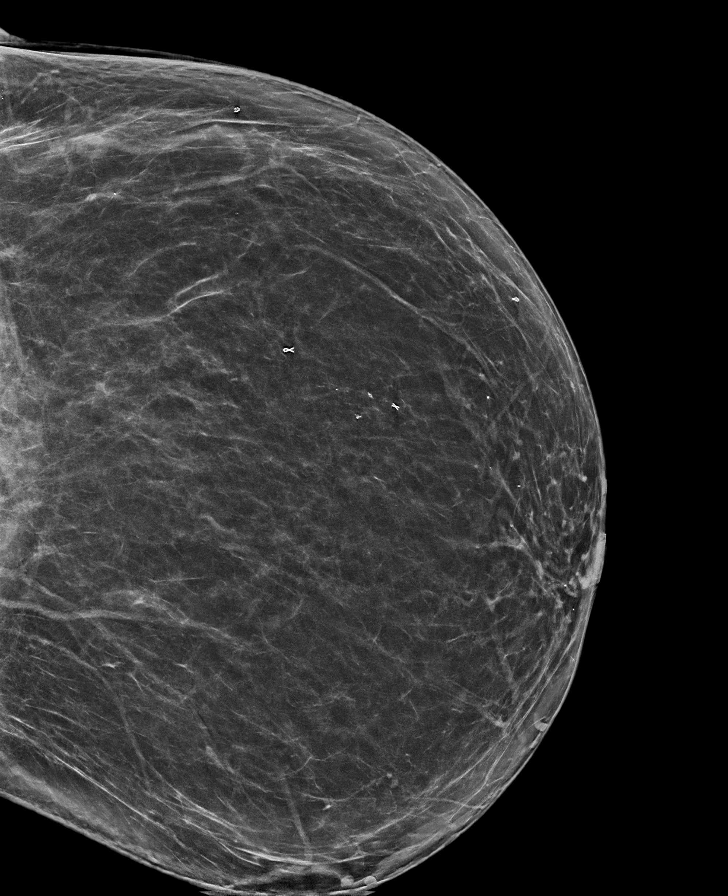

[R MLO tomo · tomo slice 47/92.0]
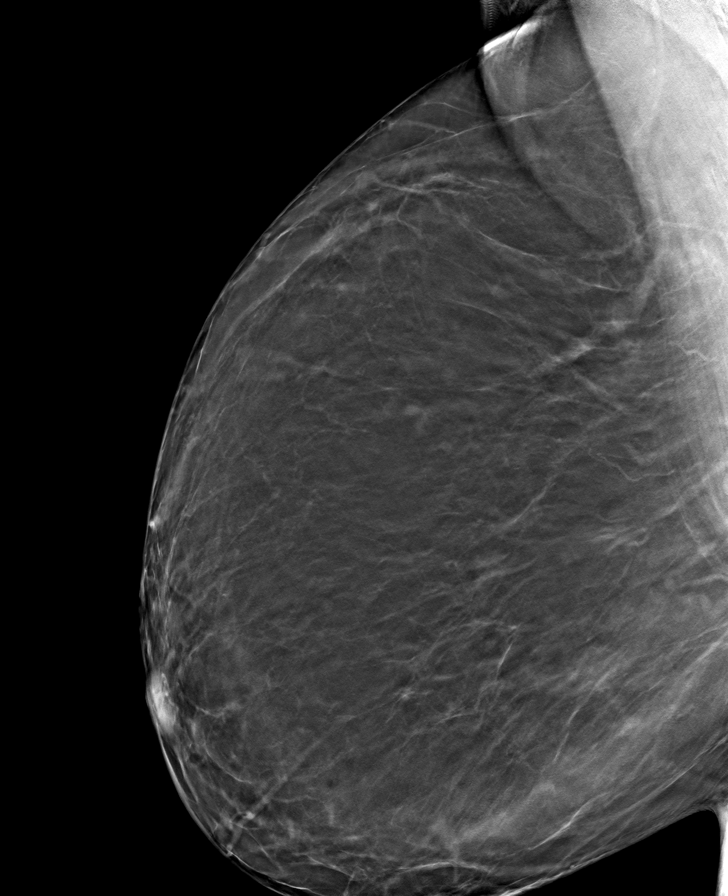

[8 of 40 positions shown; findings below may reference images not displayed]

ACR Breast Density Category b: There are scattered areas of
fibroglandular density.
FINDINGS: There are no findings suspicious for malignancy. The images were
evaluated with computer-aided detection.
IMPRESSION: No mammographic evidence of malignancy. A result letter of this
screening mammogram will be mailed directly to the patient.

RECOMMENDATION:
Screening mammogram in one year. (Code:[id number]-[id number])

BI-RADS CATEGORY  1: Negative.

## 2021-07-05 ENCOUNTER — Other Ambulatory Visit: Payer: Self-pay | Admitting: Family Medicine

## 2021-07-05 DIAGNOSIS — J3089 Other allergic rhinitis: Secondary | ICD-10-CM

## 2021-07-16 ENCOUNTER — Other Ambulatory Visit: Payer: Self-pay | Admitting: Family Medicine

## 2021-07-16 DIAGNOSIS — J3089 Other allergic rhinitis: Secondary | ICD-10-CM

## 2021-07-16 DIAGNOSIS — I10 Essential (primary) hypertension: Secondary | ICD-10-CM

## 2021-07-16 DIAGNOSIS — E1169 Type 2 diabetes mellitus with other specified complication: Secondary | ICD-10-CM

## 2021-07-16 DIAGNOSIS — E785 Hyperlipidemia, unspecified: Secondary | ICD-10-CM

## 2021-07-18 ENCOUNTER — Other Ambulatory Visit: Payer: Self-pay | Admitting: Family Medicine

## 2021-07-18 DIAGNOSIS — E1169 Type 2 diabetes mellitus with other specified complication: Secondary | ICD-10-CM

## 2021-07-18 DIAGNOSIS — E669 Obesity, unspecified: Secondary | ICD-10-CM

## 2021-07-18 NOTE — Telephone Encounter (Signed)
last RF 06/03/21 #9 1 ml    1 month= 3 ml pen Too early Requested Prescriptions  Refused Prescriptions Disp Refills  . OZEMPIC, 1 MG/DOSE, 4 MG/3ML SOPN [Pharmacy Med Name: OZEMPIC 1MG  PER DOSE (1X4MG  PEN)] 9 mL 1    Sig: INJECT 1 MG UNDER THE SKIN ONCE A WEEK     Endocrinology:  Diabetes - GLP-1 Receptor Agonists Passed - 07/18/2021  1:33 PM      Passed - HBA1C is between 0 and 7.9 and within 180 days    Hemoglobin A1C  Date Value Ref Range Status  06/03/2021 6.5 (A) 4.0 - 5.6 % Final   HbA1c, POC (prediabetic range)  Date Value Ref Range Status  07/27/2018 7.4 (A) 5.7 - 6.4 % Final   HbA1c, POC (controlled diabetic range)  Date Value Ref Range Status  02/28/2019 7.3 (A) 0.0 - 7.0 % Final   Hgb A1c MFr Bld  Date Value Ref Range Status  11/29/2019 6.9 (H) 4.8 - 5.6 % Final    Comment:             Prediabetes: 5.7 - 6.4          Diabetes: >6.4          Glycemic control for adults with diabetes: <7.0          Passed - Valid encounter within last 6 months    Recent Outpatient Visits          1 month ago Dyslipidemia associated with type 2 diabetes mellitus [facility])   [facility] Steele Sizer, MD   3 months ago Viral URI   [place], DO   5 months ago Diabetes mellitus type 2 in obese [facility])   [facility] Steele Sizer, MD   9 months ago Diabetes mellitus type 2 in obese [facility])   [facility] Steele Sizer, MD   1 year ago Diabetes mellitus type 2 in obese [facility])   [facility] Steele Sizer, MD      Future Appointments            In 4 months Ancil Boozer, Drue Stager, MD [facility], [facility]

## 2021-07-18 NOTE — Telephone Encounter (Signed)
Duplicate request

## 2021-07-31 ENCOUNTER — Other Ambulatory Visit: Payer: Self-pay | Admitting: Family Medicine

## 2021-07-31 DIAGNOSIS — B3731 Acute candidiasis of vulva and vagina: Secondary | ICD-10-CM

## 2021-08-07 ENCOUNTER — Other Ambulatory Visit: Payer: Self-pay | Admitting: Family Medicine

## 2021-08-07 DIAGNOSIS — E1169 Type 2 diabetes mellitus with other specified complication: Secondary | ICD-10-CM

## 2021-10-01 ENCOUNTER — Other Ambulatory Visit: Payer: Self-pay | Admitting: Family Medicine

## 2021-10-01 DIAGNOSIS — J3089 Other allergic rhinitis: Secondary | ICD-10-CM

## 2021-10-14 ENCOUNTER — Other Ambulatory Visit: Payer: Self-pay | Admitting: Family Medicine

## 2021-10-14 DIAGNOSIS — M109 Gout, unspecified: Secondary | ICD-10-CM

## 2021-11-11 ENCOUNTER — Other Ambulatory Visit: Payer: Self-pay | Admitting: Family Medicine

## 2021-12-02 NOTE — Progress Notes (Signed)
Name: Maria Cruz   MRN: [id number]    DOB: 27-Jan-1956   Date:12/03/2021 ? ?     Progress Note ? ?Subjective ? ?Chief Complaint ? ?Follow up  ? ?HPI ? ?DM II with proteinuria: urine micro has been elevated since 2019, under the care of Dr. Abigail Butts since 2021. She is on  ARB/CCB, Xigduo, Ozempic  A1C has been controlled with current regiment and she is due for repeat A1C. We will rx diflucan to take twice weekly , recommended to stay on SGL2 agonist since she has macroalbuminuria.   She has dyslipidemia and takes Rosuvastatin and Lovaza, she also has microalbuminuria and is on ARB. She denies polyphagia, polydipsia and polyuria. A1C was 6.5 % and stable 6 months ago  ? ?Left flank pain: she noticed some pain on right flank about one month ago , aggravated by movement - like when rolling in bed. She denies dysuria. She denies fever or chills. Urine showed hematuria , we will send urine for culture and needs to discuss it with nephrologist if culture is negative  ?  ?Hyperlipidemia: Taking Lovaza and Rosuvastatin and LDL , last LDL was at  goal - 53, continue medications. Denies myopathy  ? ?Spot on gluteal fold: going on for a month, initially drained but now she pics on scab and is not healing  ?  ?HTN: She is on AZOR for now ( Norvasc plus Benicar ) Denies chest pain, palpitation or dizziness. She has DM also and proteinuria, under the care of nephrologist and has follow up with nephrologist in June.   ?  ?Gout: no recent episodes, taking Allopurinol daily, denies side effects. She has been compliant with medication . Stable  ?  ?Right 5 th finger pain: she was seen by neurologist and ortho, had right ulnar nerve release and is feeling better however still has numbness and tingling on 4th and 5 th fingers of right hand and also unable to completely extend left 5 th finger, still taking Lyrica and is still doing the exercises that PT recommended. She would like a refill of Lyrica today  ?  ?History of  left hip  replacement on 12/05/2019 by Dr. Harlow Mares, she completed PT but continues to have some tightness on left outer thigh near hip ? ?EG:[id number] in 2021, seeing Dr. Posey Pronto, could not take methotrexate because of elevation of liver enzymes,  she has been taking plaquenil and in 2022 also received Cetrolizumab injections but having problems with her insurance now. 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 ?Patient Active Problem List  ? Diagnosis Date Noted  ? Cubital tunnel syndrome, right 07/16/2020  ? Meralgia paraesthetica, left 07/16/2020  ? Ulnar neuropathy at elbow, right 07/16/2020  ? Numbness and tingling 06/10/2020  ? Encounter for long-term (current) use of high-risk medication 06/02/2020  ? LFT elevation 06/02/2020  ? Osteoarthritis of spine with radiculopathy, cervical region 06/02/2020  ? Primary osteoarthritis involving multiple joints 06/02/2020  ? Rheumatoid arthritis of multiple sites with negative rheumatoid factor ([place]) 06/02/2020  ? Acute posthemorrhagic anemia 03/27/2020  ? Hip pain 03/27/2020  ? Osteoarthritis 03/27/2020  ? Sinus tachycardia 03/27/2020  ? History of total hip arthroplasty 12/18/2019  ? Proteinuria, unspecified 10/10/2019  ? Microcalcification of left breast on mammogram 05/03/2017  ? Herniated intervertebral disc of lumbar spine 09/02/2016  ? Spondylolisthesis of lumbar region 09/02/2016  ? Type 2 diabetes mellitus with  albuminuria ([place]) 02/24/2015  ? Vitamin D deficiency 02/24/2015  ? Perennial allergic rhinitis 02/24/2015  ? Hypercalcemia 02/24/2015  ? GERD without esophagitis 02/24/2015  ? Hypertension, benign 02/24/2015  ? Controlled gout 02/24/2015  ? Fatty liver 02/24/2015  ? Callus of foot 02/24/2015  ? Primary osteoarthritis of right hip 02/24/2015  ? History of carpal tunnel syndrome 02/24/2015  ? Obesity (BMI 30-39.9) 02/24/2015  ? Umbilical hernia without obstruction or gangrene 02/24/2015  ?  Intermittent low back pain 02/24/2015  ? History of colonic polyps 02/24/2015  ? Dyslipidemia 02/24/2015  ? ? ?Past Surgical History:  ?Procedure Laterality Date  ? ABDOMINAL HYSTERECTOMY  2012  ? ANTERIOR INTEROSSEOUS NERVE DECOMPRESSION Right 12/16/2020  ? Procedure: Right ulnar nerve release, elbow;  Surgeon: Hessie Knows, MD;  Location: [facility] ORS;  Service: Orthopedics;  Laterality: Right;  ? APPENDECTOMY  2012  ? BREAST BIOPSY Right 07/12/2012  ? Negative  ? BREAST BIOPSY Left 05/09/2017  ? Affirm Bx of two areas-FIBROADENOMA WITH COARSE CALCIFICATION. NEGATIVE FOR ATYPIA,COLLAPSED CYST AND FIBROADENOMATOID CHANGE  ? COLONOSCOPY  2009  ? Dr Allen Norris  ? COLONOSCOPY WITH PROPOFOL N/A 07/27/2017  ? Procedure: COLONOSCOPY WITH PROPOFOL;  Surgeon: Robert Bellow, MD;  Location: [facility] ENDOSCOPY;  Service: Endoscopy;  Laterality: N/A;  ? [place] OF UTERUS  2007  ? TUBAL LIGATION    ? ? ?Family History  ?Problem Relation Age of Onset  ? Multiple sclerosis Mother   ? Heart attack Father   ? Kidney failure Sister   ? Heart attack Brother   ? Diabetes Daughter   ? CAD Sister   ? Arthritis Sister   ?     RA  ? Lupus Sister   ? Breast cancer Neg Hx   ? Colon cancer Neg Hx   ? ? ?Social History  ? ?Tobacco Use  ? Smoking status: Never  ? Smokeless tobacco: Never  ?Substance Use Topics  ? Alcohol use: No  ?  Alcohol/week: 0.0 standard drinks  ? ? ? ?Current Outpatient Medications:  ?  acetaminophen (TYLENOL) 500 MG tablet, Take 1 tablet (500 mg total) by mouth every 6 (six) hours as needed. (Patient taking differently: Take 500 mg by mouth 2 (two) times daily as needed for moderate pain.), Disp: 90 tablet, Rfl: 0 ?  allopurinol (ZYLOPRIM) 300 MG tablet, Take 1 tablet (300 mg total) by mouth 2 (two) times daily., Disp: 180 tablet, Rfl: 1 ?  aspirin 81 MG chewable tablet, Chew 81 mg by mouth daily., Disp: , Rfl:  ?  baclofen (LIORESAL) 10 MG tablet, Take 1 tablet (10 mg total) by mouth 3 (three) times daily as  needed for muscle spasms., Disp: 90 each, Rfl: 0 ?  blood glucose meter kit and supplies, Dispense based on patient and insurance preference. Use up to four times daily as directed. (FOR ICD-10 E10.9, E11.9). Check fsbs up to twice daily, Disp: 1 each, Rfl: 0 ?  Blood Glucose Monitoring Suppl (FREESTYLE LITE) DEVI, 1 each by Does not apply route daily., Disp: 1 each, Rfl: 0 ?  Certolizumab Pegol 2 X 200 MG/ML KIT, Inject 1 each into the skin every 30 (thirty) days., Disp: , Rfl:  ?  Cholecalciferol 25 MCG (1000 UT) tablet, Take 1,000 Units by mouth in the morning and at bedtime., Disp: , Rfl:  ?  Coenzyme Q10 (COQ10) 100 MG CAPS, Take 100 mg by mouth 2 (two) times daily., Disp: , Rfl:  ?  diclofenac Sodium (VOLTAREN) 1 %  GEL, Apply 2 g topically 4 (four) times daily., Disp: 100 g, Rfl: 2 ?  fluticasone (FLONASE) 50 MCG/ACT nasal spray, SHAKE LIQUID AND USE 2 SPRAYS IN EACH NOSTRIL DAILY, Disp: 48 g, Rfl: 0 ?  hydroxychloroquine (PLAQUENIL) 200 MG tablet, Take 200 mg by mouth 2 (two) times daily., Disp: , Rfl:  ?  Lancets (ONETOUCH DELICA PLUS [id number]) MISC, TEST UP TO FOUR TIMES DAILY, Disp: 100 each, Rfl: 11 ?  Multiple Vitamin (MULTIVITAMIN WITH MINERALS) TABS tablet, Take 1 tablet by mouth daily., Disp: , Rfl:  ?  omega-3 acid ethyl esters (LOVAZA) 1 g capsule, TAKE 2 CAPSULES BY MOUTH TWICE DAILY, Disp: 360 capsule, Rfl: 2 ?  ONETOUCH ULTRA test strip, TEST UP TO FOUR TIMES DAILY, Disp: 300 strip, Rfl: 1 ?  polyethylene glycol powder (GLYCOLAX/MIRALAX) 17 GM/SCOOP powder, Take 17 g by mouth daily as needed for mild constipation or moderate constipation., Disp: 850 g, Rfl: 0 ?  XIGDUO XR 05-999 MG TB24, TAKE 1 TABLET BY MOUTH DAILY, Disp: 90 tablet, Rfl: 1 ?  amLODipine-olmesartan (AZOR) 5-40 MG tablet, Take 1 tablet by mouth daily., Disp: 90 tablet, Rfl: 1 ?  fluconazole (DIFLUCAN) 150 MG tablet, TAKE TWICE WEEKLY AND AS NEEDED, Disp: 24 tablet, Rfl: 0 ?  levocetirizine (XYZAL) 5 MG tablet, TAKE 1 TABLET(5  MG) BY MOUTH EVERY EVENING, Disp: 90 tablet, Rfl: 1 ?  montelukast (SINGULAIR) 10 MG tablet, TAKE 1 TABLET(10 MG) BY MOUTH AT BEDTIME, Disp: 90 tablet, Rfl: 1 ?  pregabalin (LYRICA) 75 MG capsule, Take 1 capsule (75 mg

## 2021-12-03 ENCOUNTER — Encounter: Payer: Self-pay | Admitting: Family Medicine

## 2021-12-03 ENCOUNTER — Telehealth: Payer: Self-pay

## 2021-12-03 ENCOUNTER — Ambulatory Visit (INDEPENDENT_AMBULATORY_CARE_PROVIDER_SITE_OTHER): Payer: 59 | Admitting: Family Medicine

## 2021-12-03 ENCOUNTER — Other Ambulatory Visit: Payer: Self-pay | Admitting: Family Medicine

## 2021-12-03 VITALS — BP 126/72 | HR 93 | Temp 97.7°F | Resp 18 | Ht 67.0 in | Wt 221.4 lb

## 2021-12-03 DIAGNOSIS — I10 Essential (primary) hypertension: Secondary | ICD-10-CM | POA: Diagnosis not present

## 2021-12-03 DIAGNOSIS — E559 Vitamin D deficiency, unspecified: Secondary | ICD-10-CM | POA: Diagnosis not present

## 2021-12-03 DIAGNOSIS — E1169 Type 2 diabetes mellitus with other specified complication: Secondary | ICD-10-CM | POA: Diagnosis not present

## 2021-12-03 DIAGNOSIS — S31000A Unspecified open wound of lower back and pelvis without penetration into retroperitoneum, initial encounter: Secondary | ICD-10-CM

## 2021-12-03 DIAGNOSIS — E669 Obesity, unspecified: Secondary | ICD-10-CM

## 2021-12-03 DIAGNOSIS — M150 Primary generalized (osteo)arthritis: Secondary | ICD-10-CM

## 2021-12-03 DIAGNOSIS — M159 Polyosteoarthritis, unspecified: Secondary | ICD-10-CM

## 2021-12-03 DIAGNOSIS — E785 Hyperlipidemia, unspecified: Secondary | ICD-10-CM

## 2021-12-03 DIAGNOSIS — Z9889 Other specified postprocedural states: Secondary | ICD-10-CM

## 2021-12-03 DIAGNOSIS — M546 Pain in thoracic spine: Secondary | ICD-10-CM

## 2021-12-03 DIAGNOSIS — Z87898 Personal history of other specified conditions: Secondary | ICD-10-CM

## 2021-12-03 DIAGNOSIS — M0609 Rheumatoid arthritis without rheumatoid factor, multiple sites: Secondary | ICD-10-CM

## 2021-12-03 DIAGNOSIS — J3089 Other allergic rhinitis: Secondary | ICD-10-CM

## 2021-12-03 DIAGNOSIS — B3731 Acute candidiasis of vulva and vagina: Secondary | ICD-10-CM

## 2021-12-03 DIAGNOSIS — R3129 Other microscopic hematuria: Secondary | ICD-10-CM

## 2021-12-03 LAB — POCT URINALYSIS DIPSTICK (MANUAL)
Nitrite, UA: NEGATIVE
Poct Bilirubin: NEGATIVE
Poct Blood: 250 — AB
Poct Glucose: 1000 mg/dL — AB
Poct Ketones: NEGATIVE
Poct Urobilinogen: 1 mg/dL — AB
Spec Grav, UA: 1.01 (ref 1.010–1.025)
pH, UA: 5 (ref 5.0–8.0)

## 2021-12-03 MED ORDER — LEVOCETIRIZINE DIHYDROCHLORIDE 5 MG PO TABS: ORAL_TABLET | ORAL | 1 refills | Status: DC

## 2021-12-03 MED ORDER — OZEMPIC (1 MG/DOSE) 4 MG/3ML ~~LOC~~ SOPN: 1.0000 mg | PEN_INJECTOR | SUBCUTANEOUS | 1 refills | Status: DC

## 2021-12-03 MED ORDER — PREGABALIN 75 MG PO CAPS: 75.0000 mg | ORAL_CAPSULE | Freq: Three times a day (TID) | ORAL | 1 refills | Status: DC

## 2021-12-03 MED ORDER — BACLOFEN 10 MG PO TABS: 10.0000 mg | ORAL_TABLET | Freq: Three times a day (TID) | ORAL | 0 refills | Status: DC | PRN

## 2021-12-03 MED ORDER — ROSUVASTATIN CALCIUM 40 MG PO TABS: ORAL_TABLET | ORAL | 1 refills | Status: DC

## 2021-12-03 MED ORDER — FLUCONAZOLE 150 MG PO TABS: ORAL_TABLET | ORAL | 0 refills | Status: DC

## 2021-12-03 MED ORDER — MONTELUKAST SODIUM 10 MG PO TABS: ORAL_TABLET | ORAL | 1 refills | Status: DC

## 2021-12-03 MED ORDER — AMLODIPINE-OLMESARTAN 5-40 MG PO TABS: 1.0000 | ORAL_TABLET | Freq: Every day | ORAL | 1 refills | Status: DC

## 2021-12-03 MED ORDER — SCOPOLAMINE 1 MG/3DAYS TD PT72: 1.0000 | MEDICATED_PATCH | TRANSDERMAL | 0 refills | Status: DC

## 2021-12-03 NOTE — Telephone Encounter (Signed)
Pt is going on a trip on next Saturday and forgot to ask you for a prescription for the patch to go behind the ear. Please send to [facility]

## 2021-12-07 LAB — SPECIMEN STATUS REPORT

## 2021-12-07 LAB — CULTURE, URINE COMPREHENSIVE

## 2021-12-08 LAB — LIPID PANEL
Chol/HDL Ratio: 2 ratio (ref 0.0–4.4)
Cholesterol, Total: 120 mg/dL (ref 100–199)
HDL: 59 mg/dL (ref >39)
LDL Chol Calc (NIH): 40 mg/dL (ref 0–99)
Triglycerides: 117 mg/dL (ref 0–149)
VLDL Cholesterol Cal: 21 mg/dL (ref 5–40)

## 2021-12-08 LAB — CBC WITH DIFFERENTIAL/PLATELET
Basophils Absolute: 0 10*3/uL (ref 0.0–0.2)
Basos: 1 %
EOS (ABSOLUTE): 0.1 10*3/uL (ref 0.0–0.4)
Eos: 1 %
Hematocrit: 39.8 % (ref 34.0–46.6)
Hemoglobin: 13.7 g/dL (ref 11.1–15.9)
Immature Grans (Abs): 0 10*3/uL (ref 0.0–0.1)
Immature Granulocytes: 0 %
Lymphocytes Absolute: 2.3 10*3/uL (ref 0.7–3.1)
Lymphs: 45 %
MCH: 31.3 pg (ref 26.6–33.0)
MCHC: 34.4 g/dL (ref 31.5–35.7)
MCV: 91 fL (ref 79–97)
Monocytes Absolute: 0.4 10*3/uL (ref 0.1–0.9)
Monocytes: 7 %
Neutrophils Absolute: 2.3 10*3/uL (ref 1.4–7.0)
Neutrophils: 46 %
Platelets: 248 10*3/uL (ref 150–450)
RBC: 4.38 x10E6/uL (ref 3.77–5.28)
RDW: 13.2 % (ref 11.7–15.4)
WBC: 5 10*3/uL (ref 3.4–10.8)

## 2021-12-08 LAB — HEMOGLOBIN A1C
Est. average glucose Bld gHb Est-mCnc: 160 mg/dL
Hgb A1c MFr Bld: 7.2 % — ABNORMAL HIGH (ref 4.8–5.6)

## 2021-12-08 LAB — MICROALBUMIN / CREATININE URINE RATIO
Creatinine, Urine: 51.2 mg/dL
Microalb/Creat Ratio: 1642 mg/g creat — ABNORMAL HIGH (ref 0–29)
Microalbumin, Urine: 840.6 ug/mL

## 2021-12-08 LAB — VITAMIN D 25 HYDROXY (VIT D DEFICIENCY, FRACTURES): Vit D, 25-Hydroxy: 56.6 ng/mL (ref 30.0–100.0)

## 2021-12-31 ENCOUNTER — Other Ambulatory Visit: Payer: Self-pay | Admitting: Family Medicine

## 2021-12-31 DIAGNOSIS — E559 Vitamin D deficiency, unspecified: Secondary | ICD-10-CM

## 2022-01-11 ENCOUNTER — Other Ambulatory Visit: Payer: Self-pay | Admitting: Family Medicine

## 2022-01-11 DIAGNOSIS — I10 Essential (primary) hypertension: Secondary | ICD-10-CM

## 2022-01-11 DIAGNOSIS — E1169 Type 2 diabetes mellitus with other specified complication: Secondary | ICD-10-CM

## 2022-01-11 DIAGNOSIS — J3089 Other allergic rhinitis: Secondary | ICD-10-CM

## 2022-01-11 DIAGNOSIS — E785 Hyperlipidemia, unspecified: Secondary | ICD-10-CM

## 2022-01-28 ENCOUNTER — Other Ambulatory Visit: Payer: Self-pay | Admitting: Family Medicine

## 2022-01-28 DIAGNOSIS — E559 Vitamin D deficiency, unspecified: Secondary | ICD-10-CM

## 2022-02-01 LAB — BASIC METABOLIC PANEL
BUN: 17 (ref 4–21)
CO2: 22 (ref 13–22)
Chloride: 104 (ref 99–108)
Creatinine: 0.8 (ref 0.5–1.1)
Glucose: 185
Potassium: 4.3 mEq/L (ref 3.5–5.1)
Sodium: 143 (ref 137–147)

## 2022-02-01 LAB — COMPREHENSIVE METABOLIC PANEL
Albumin: 4.8 (ref 3.5–5.0)
Calcium: 10.1 (ref 8.7–10.7)
eGFR: 86

## 2022-02-01 LAB — MICROALBUMIN / CREATININE URINE RATIO: Microalb Creat Ratio: 783

## 2022-02-01 LAB — MICROALBUMIN, URINE: Microalb, Ur: 91.5

## 2022-03-04 ENCOUNTER — Other Ambulatory Visit: Payer: Self-pay | Admitting: Family Medicine

## 2022-03-04 DIAGNOSIS — E559 Vitamin D deficiency, unspecified: Secondary | ICD-10-CM

## 2022-03-08 ENCOUNTER — Other Ambulatory Visit: Payer: Self-pay | Admitting: Family Medicine

## 2022-03-08 DIAGNOSIS — E1169 Type 2 diabetes mellitus with other specified complication: Secondary | ICD-10-CM

## 2022-04-09 ENCOUNTER — Other Ambulatory Visit: Payer: Self-pay | Admitting: Family Medicine

## 2022-04-09 DIAGNOSIS — M109 Gout, unspecified: Secondary | ICD-10-CM

## 2022-04-22 ENCOUNTER — Other Ambulatory Visit: Payer: Self-pay | Admitting: Family Medicine

## 2022-04-22 DIAGNOSIS — B3731 Acute candidiasis of vulva and vagina: Secondary | ICD-10-CM

## 2022-06-03 NOTE — Progress Notes (Signed)
Name: Maria Cruz   MRN: [id number]    DOB: [birth year]   Date:06/04/2022       Progress Note  Subjective  Chief Complaint  Follow Up  HPI  DM II with proteinuria: urine micro has been elevated since 2019, under the care of Dr. Abigail Butts since 2021. She is on  ARB/CCB, Xigduo, Ozempic 54m  A1C is now above goal, likely from recent steroid injections on right arm, advised to follow a very low carb diet the days following any steroid injections, also needs to stop having biscuits for breakfast and needs to resume regular physical activity since her current part time job is very sedentary .  She has dyslipidemia and takes Rosuvastatin and Lovaza, she also has microalbuminuria and is on ARB. She denies polyphagia, polydipsia and polyuria. A1C was 6.5 % , went up to 7.2 % and today is 8.3 %    Hyperlipidemia: Taking Lovaza and Rosuvastatin and LDL , last LDL was at  goal - 53, continue medications. Denies myopathy Recheck next visit    HTN: She is on AZOR for now ( Norvasc plus Benicar ) Denies chest pain, palpitation or dizziness. She has DM also and proteinuria, under the care of nephrologist , GFR is normal    Gout:  taking Allopurinol daily, denies side effects. She has been compliant with medication . No recent episodes    Right 5 th finger pain: she was seen by neurologist and ortho, had right ulnar nerve release and is feeling better however still has numbness and tingling on 4th and 5 th fingers of right hand and also unable to completely extend left 5 th finger, however had a recurrence, ortho is given her steroid injections, she prefers not have surgery at this time    History of  left hip replacement on 12/05/2019 by Dr. BHarlow Mares she completed PT but continues to have some tightness on left outer thigh near hip, stable   RVW:[id number] 2021, seeing Dr. PPosey Pronto could not take methotrexate because of elevation of liver enzymes,  she has been taking plaquenil and is getting Cetrolizumab  injections again since she has Medicare part D. She states it has helped   Patient Active Problem List   Diagnosis Date Noted   Cubital tunnel syndrome, right 07/16/2020   Meralgia paraesthetica, left 07/16/2020   Ulnar neuropathy at elbow, right 07/16/2020   Numbness and tingling 06/10/2020   Encounter for long-term (current) use of high-risk medication 06/02/2020   LFT elevation 06/02/2020   Osteoarthritis of spine with radiculopathy, cervical region 06/02/2020   Primary osteoarthritis involving multiple joints 06/02/2020   Rheumatoid arthritis of multiple sites with negative rheumatoid factor ([place] 06/02/2020   Acute posthemorrhagic anemia 03/27/2020   Hip pain 03/27/2020   Osteoarthritis 03/27/2020   Sinus tachycardia 03/27/2020   History of total hip arthroplasty 12/18/2019   Proteinuria, unspecified 10/10/2019   Microcalcification of left breast on mammogram 05/03/2017   Herniated intervertebral disc of lumbar spine 09/02/2016   Spondylolisthesis of lumbar region 09/02/2016   Type 2 diabetes mellitus with albuminuria ([place] 02/24/2015   Vitamin D deficiency 02/24/2015   Perennial allergic rhinitis 02/24/2015   Hypercalcemia 02/24/2015   GERD without esophagitis 02/24/2015   Hypertension, benign 02/24/2015   Controlled gout 02/24/2015   Fatty liver 02/24/2015   Callus of foot 02/24/2015   Primary osteoarthritis of right hip 02/24/2015   History of carpal tunnel syndrome 02/24/2015   Obesity (BMI 30-39.9) 081/59/4707  Umbilical hernia without obstruction or  gangrene 02/24/2015   Intermittent low back pain 02/24/2015   History of colonic polyps 02/24/2015   Dyslipidemia 02/24/2015    Past Surgical History:  Procedure Laterality Date   ABDOMINAL HYSTERECTOMY  2012   ANTERIOR INTEROSSEOUS NERVE DECOMPRESSION Right 12/16/2020   Procedure: Right ulnar nerve release, elbow;  Surgeon: Hessie Knows, MD;  Location: [facility] ORS;  Service: Orthopedics;  Laterality: Right;    APPENDECTOMY  2012   BREAST BIOPSY Right 07/12/2012   Negative   BREAST BIOPSY Left 05/09/2017   Affirm Bx of two areas-FIBROADENOMA WITH COARSE CALCIFICATION. NEGATIVE FOR ATYPIA,COLLAPSED CYST AND FIBROADENOMATOID CHANGE   COLONOSCOPY  2009   Dr Allen Norris   COLONOSCOPY WITH PROPOFOL N/A 07/27/2017   Procedure: COLONOSCOPY WITH PROPOFOL;  Surgeon: Robert Bellow, MD;  Location: [facility] ENDOSCOPY;  Service: Endoscopy;  Laterality: N/A;   DILATION AND CURETTAGE OF UTERUS  2007   TUBAL LIGATION      Family History  Problem Relation Age of Onset   Multiple sclerosis Mother    Heart attack Father    Kidney failure Sister    Heart attack Brother    Diabetes Daughter    CAD Sister    Arthritis Sister        RA   Lupus Sister    Breast cancer Neg Hx    Colon cancer Neg Hx     Social History   Tobacco Use   Smoking status: Never   Smokeless tobacco: Never  Substance Use Topics   Alcohol use: No    Alcohol/week: 0.0 standard drinks of alcohol     Current Outpatient Medications:    acetaminophen (TYLENOL) 500 MG tablet, Take 1 tablet (500 mg total) by mouth every 6 (six) hours as needed. (Patient taking differently: Take 500 mg by mouth 2 (two) times daily as needed for moderate pain.), Disp: 90 tablet, Rfl: 0   allopurinol (ZYLOPRIM) 300 MG tablet, TAKE 1 TABLET(300 MG) BY MOUTH TWICE DAILY, Disp: 180 tablet, Rfl: 0   amLODipine-olmesartan (AZOR) 5-40 MG tablet, TAKE 1 TABLET BY MOUTH DAILY, Disp: 90 tablet, Rfl: 1   aspirin 81 MG chewable tablet, Chew 81 mg by mouth daily., Disp: , Rfl:    baclofen (LIORESAL) 10 MG tablet, TAKE 1 TABLET(10 MG) BY MOUTH THREE TIMES DAILY AS NEEDED FOR MUSCLE SPASMS, Disp: 90 tablet, Rfl: 0   blood glucose meter kit and supplies, Dispense based on patient and insurance preference. Use up to four times daily as directed. (FOR ICD-10 E10.9, E11.9). Check fsbs up to twice daily, Disp: 1 each, Rfl: 0   Blood Glucose Monitoring Suppl (FREESTYLE LITE) DEVI,  1 each by Does not apply route daily., Disp: 1 each, Rfl: 0   Certolizumab Pegol 2 X 200 MG/ML KIT, Inject 1 each into the skin every 30 (thirty) days., Disp: , Rfl:    Cholecalciferol 25 MCG (1000 UT) tablet, Take 1,000 Units by mouth in the morning and at bedtime., Disp: , Rfl:    Coenzyme Q10 (COQ10) 100 MG CAPS, Take 100 mg by mouth 2 (two) times daily., Disp: , Rfl:    diclofenac Sodium (VOLTAREN) 1 % GEL, Apply 2 g topically 4 (four) times daily., Disp: 100 g, Rfl: 2   fluconazole (DIFLUCAN) 150 MG tablet, TAKE 1 TABLET BY MOUTH TWICE WEEKLY AND AS NEEDED, Disp: 24 tablet, Rfl: 0   fluticasone (FLONASE) 50 MCG/ACT nasal spray, SHAKE LIQUID AND USE 2 SPRAYS IN EACH NOSTRIL DAILY, Disp: 48 g, Rfl: 0   hydroxychloroquine (  PLAQUENIL) 200 MG tablet, Take 200 mg by mouth 2 (two) times daily., Disp: , Rfl:    Lancets (ONETOUCH DELICA PLUS [id number]) MISC, TEST UP TO FOUR TIMES DAILY, Disp: 100 each, Rfl: 11   levocetirizine (XYZAL) 5 MG tablet, TAKE 1 TABLET(5 MG) BY MOUTH EVERY EVENING, Disp: 90 tablet, Rfl: 1   montelukast (SINGULAIR) 10 MG tablet, TAKE 1 TABLET(10 MG) BY MOUTH AT BEDTIME, Disp: 90 tablet, Rfl: 1   Multiple Vitamin (MULTIVITAMIN WITH MINERALS) TABS tablet, Take 1 tablet by mouth daily., Disp: , Rfl:    omega-3 acid ethyl esters (LOVAZA) 1 g capsule, TAKE 2 CAPSULES BY MOUTH TWICE DAILY, Disp: 360 capsule, Rfl: 0   ONETOUCH ULTRA test strip, TEST UP TO FOUR TIMES DAILY, Disp: 300 strip, Rfl: 1   OZEMPIC, 1 MG/DOSE, 4 MG/3ML SOPN, INJECT 1 MG UNDER THE SKIN ONCE A WEEK, Disp: 9 mL, Rfl: 1   polyethylene glycol powder (GLYCOLAX/MIRALAX) 17 GM/SCOOP powder, Take 17 g by mouth daily as needed for mild constipation or moderate constipation., Disp: 850 g, Rfl: 0   pregabalin (LYRICA) 75 MG capsule, Take 1 capsule (75 mg total) by mouth 3 (three) times daily. Taking 35m in AM, 1533min PM, Disp: 270 capsule, Rfl: 1   rosuvastatin (CRESTOR) 40 MG tablet, TAKE 1 TABLET(40 MG) BY MOUTH  DAILY, Disp: 90 tablet, Rfl: 1   XIGDUO XR 05-999 MG TB24, TAKE 1 TABLET BY MOUTH DAILY, Disp: 90 tablet, Rfl: 1  Allergies  Allergen Reactions   Ace Inhibitors Cough   Lipofen [Fenofibrate] Other (See Comments)    Localized drug reaction, blister on back   Penicillins Itching   Sulfa Antibiotics Itching    I personally reviewed active problem list, medication list, allergies, family history, social history, health maintenance with the patient/caregiver today.   ROS  Constitutional: Negative for fever or weight change.  Respiratory: Negative for cough and shortness of breath.   Cardiovascular: Negative for chest pain or palpitations.  Gastrointestinal: Negative for abdominal pain, no bowel changes.  Musculoskeletal: Negative for gait problem or joint swelling.  Skin: Negative for rash.  Neurological: Negative for dizziness or headache.  No other specific complaints in a complete review of systems (except as listed in HPI above).   Objective  Vitals:   06/04/22 0826  BP: 126/82  Pulse: 100  Resp: 16  Temp: 97.8 F (36.6 C)  SpO2: 98%  Weight: 216 lb 12.8 oz (98.3 kg)  Height: 5' 7"  (1.702 m)    Body mass index is 33.96 kg/m.  Physical Exam  Constitutional: Patient appears well-developed and well-nourished. Obese  No distress.  HEENT: head atraumatic, normocephalic, pupils equal and reactive to light, neck supple Cardiovascular: Normal rate, regular rhythm and normal heart sounds.  No murmur heard. No BLE edema. Pulmonary/Chest: Effort normal and breath sounds normal. No respiratory distress. Abdominal: Soft.  There is no tenderness. Psychiatric: Patient has a normal mood and affect. behavior is normal. Judgment and thought content normal.   Recent Results (from the past 2160 hour(s))  POCT HgB A1C     Status: Abnormal   Collection Time: 06/04/22  8:38 AM  Result Value Ref Range   Hemoglobin A1C 8.3 (A) 4.0 - 5.6 %   HbA1c POC (<> result, manual entry)      HbA1c, POC (prediabetic range)     HbA1c, POC (controlled diabetic range)       PHQ2/9:    06/04/2022    8:37 AM 12/03/2021  9:29 AM 06/03/2021    9:36 AM 03/25/2021    9:43 AM 02/02/2021    9:08 AM  Depression screen PHQ 2/9  Decreased Interest 0 0 0 0 0  Down, Depressed, Hopeless 0 0 0 0 0  PHQ - 2 Score 0 0 0 0 0  Altered sleeping 0 0 0    Tired, decreased energy 0 0 0    Change in appetite 0 0 0    Feeling bad or failure about yourself  0 0 0    Trouble concentrating 0 0 0    Moving slowly or fidgety/restless 0 0 0    Suicidal thoughts 0 0 0    PHQ-9 Score 0 0 0    Difficult doing work/chores Not difficult at all        phq 9 is negative   Fall Risk:    06/04/2022    8:27 AM 12/03/2021    9:29 AM 06/03/2021    9:36 AM 03/25/2021    9:43 AM 02/02/2021    9:07 AM  Fall Risk   Falls in the past year? 0 0 0 0 0  Number falls in past yr: 0  0 0 0  Injury with Fall? 0  0 0 0  Risk for fall due to :  No Fall Risks No Fall Risks    Follow up  Falls prevention discussed Falls prevention discussed Falls evaluation completed       Functional Status Survey: Is the patient deaf or have difficulty hearing?: No Does the patient have difficulty seeing, even when wearing glasses/contacts?: No Does the patient have difficulty concentrating, remembering, or making decisions?: No Does the patient have difficulty walking or climbing stairs?: No Does the patient have difficulty dressing or bathing?: No Does the patient have difficulty doing errands alone such as visiting a doctor's office or shopping?: No    Assessment & Plan  1. Dyslipidemia associated with type 2 diabetes mellitus (HCC)  - POCT HgB A1C  2. Need for pneumococcal vaccine  - Pneumococcal conjugate vaccine 20-valent (Prevnar 20)  3. Rheumatoid arthritis of multiple sites with negative rheumatoid factor (HCC)  Keep follow up with Dr. Posey Pronto   4. Type 2 diabetes mellitus with albuminuria ([place])  Keep  follow up with nephrologist   5. Need for immunization against influenza  - Flu Vaccine QUAD High Dose(Fluad)  6. Vitamin D deficiency  Continue supplementation   7. Hypertension, benign  Bp is at goal   8. History of decompression of ulnar nerve   9. Controlled gout  Under control   10. Perennial allergic rhinitis    Discussed welcome to medicare, we will obtain eye exam records, advised to contact rx coverage and find out if generic are free through mail order and if not use goodrx

## 2022-06-04 ENCOUNTER — Encounter: Payer: Self-pay | Admitting: Family Medicine

## 2022-06-04 ENCOUNTER — Ambulatory Visit (INDEPENDENT_AMBULATORY_CARE_PROVIDER_SITE_OTHER): Payer: Medicare Other | Admitting: Family Medicine

## 2022-06-04 VITALS — BP 126/82 | HR 100 | Temp 97.8°F | Resp 16 | Ht 67.0 in | Wt 216.8 lb

## 2022-06-04 DIAGNOSIS — Z23 Encounter for immunization: Secondary | ICD-10-CM | POA: Diagnosis not present

## 2022-06-04 DIAGNOSIS — M109 Gout, unspecified: Secondary | ICD-10-CM

## 2022-06-04 DIAGNOSIS — E1169 Type 2 diabetes mellitus with other specified complication: Secondary | ICD-10-CM

## 2022-06-04 DIAGNOSIS — M0609 Rheumatoid arthritis without rheumatoid factor, multiple sites: Secondary | ICD-10-CM

## 2022-06-04 DIAGNOSIS — E1129 Type 2 diabetes mellitus with other diabetic kidney complication: Secondary | ICD-10-CM

## 2022-06-04 DIAGNOSIS — E785 Hyperlipidemia, unspecified: Secondary | ICD-10-CM | POA: Insufficient documentation

## 2022-06-04 DIAGNOSIS — Z13820 Encounter for screening for osteoporosis: Secondary | ICD-10-CM

## 2022-06-04 DIAGNOSIS — E559 Vitamin D deficiency, unspecified: Secondary | ICD-10-CM | POA: Diagnosis not present

## 2022-06-04 DIAGNOSIS — I10 Essential (primary) hypertension: Secondary | ICD-10-CM

## 2022-06-04 DIAGNOSIS — R809 Proteinuria, unspecified: Secondary | ICD-10-CM

## 2022-06-04 DIAGNOSIS — Z9889 Other specified postprocedural states: Secondary | ICD-10-CM

## 2022-06-04 DIAGNOSIS — J3089 Other allergic rhinitis: Secondary | ICD-10-CM

## 2022-06-04 LAB — POCT GLYCOSYLATED HEMOGLOBIN (HGB A1C): Hemoglobin A1C: 8.3 % — AB (ref 4.0–5.6)

## 2022-06-04 MED ORDER — PREGABALIN 75 MG PO CAPS: 75.0000 mg | ORAL_CAPSULE | Freq: Three times a day (TID) | ORAL | 0 refills | Status: DC

## 2022-06-11 ENCOUNTER — Other Ambulatory Visit: Payer: Self-pay | Admitting: Family Medicine

## 2022-06-11 ENCOUNTER — Telehealth: Payer: Self-pay | Admitting: Family Medicine

## 2022-06-11 DIAGNOSIS — E1169 Type 2 diabetes mellitus with other specified complication: Secondary | ICD-10-CM

## 2022-06-11 NOTE — Telephone Encounter (Unsigned)
Copied from [phone] [phone]. Topic: General - Other >> Jun 11, 2022 12:23 PM Ludger Nutting wrote: Estill Bamberg with [facility] called to get more information for the PA needed for patients omega-3 acid ethyl esters (LOVAZA) 1 g capsule.

## 2022-06-14 ENCOUNTER — Other Ambulatory Visit: Payer: Self-pay

## 2022-06-15 MED ORDER — XIGDUO XR 10-1000 MG PO TB24: 1.0000 | ORAL_TABLET | Freq: Every day | ORAL | 0 refills | Status: DC

## 2022-06-17 ENCOUNTER — Telehealth: Payer: Self-pay | Admitting: Family Medicine

## 2022-06-17 NOTE — Telephone Encounter (Signed)
Pharmacy preference updated in chart

## 2022-06-17 NOTE — Telephone Encounter (Signed)
Pt called to report that all of her medications can go to [facility] except for the pregabalin that can stay at [facility] on [address].

## 2022-06-25 ENCOUNTER — Other Ambulatory Visit: Payer: Self-pay | Admitting: Family Medicine

## 2022-06-25 DIAGNOSIS — E785 Hyperlipidemia, unspecified: Secondary | ICD-10-CM

## 2022-06-25 MED ORDER — OMEGA-3-ACID ETHYL ESTERS 1 G PO CAPS: 2.0000 | ORAL_CAPSULE | Freq: Two times a day (BID) | ORAL | 0 refills | Status: DC

## 2022-06-25 NOTE — Telephone Encounter (Signed)
Requested Prescriptions  Pending Prescriptions Disp Refills   omega-3 acid ethyl esters (LOVAZA) 1 g capsule 360 capsule 0    Sig: Take 2 capsules (2 g total) by mouth 2 (two) times daily.     Endocrinology:  Nutritional Agents - omega-3 acid ethyl esters Failed - 06/25/2022  9:44 AM      Failed - Lipid Panel in normal range within the last 12 months    Cholesterol, Total  Date Value Ref Range Status  12/07/2021 120 100 - 199 mg/dL Final   LDL Chol Calc (NIH)  Date Value Ref Range Status  12/07/2021 40 0 - 99 mg/dL Final   HDL  Date Value Ref Range Status  12/07/2021 59 >39 mg/dL Final   Triglycerides  Date Value Ref Range Status  12/07/2021 117 0 - 149 mg/dL Final         Passed - Valid encounter within last 12 months    Recent Outpatient Visits           3 weeks ago Dyslipidemia associated with type 2 diabetes mellitus [facility])   [facility] Steele Sizer, MD   6 months ago Dyslipidemia associated with type 2 diabetes mellitus [facility])   [facility], Drue Stager, MD   1 year ago Dyslipidemia associated with type 2 diabetes mellitus [facility])   [facility] Steele Sizer, MD   1 year ago Viral URI   [place], [place]   1 year ago Diabetes mellitus type 2 in obese [facility])   [facility] Steele Sizer, MD       Future Appointments             In 1 month Steele Sizer, MD [facility] [place]   In 3 months Steele Sizer, MD [facility], [facility]

## 2022-06-25 NOTE — Telephone Encounter (Signed)
Copied from [place] [id number]. Topic: General - Other >> Jun 25, 2022  9:34 AM Everette C wrote: Reason for CRM: Medication Refill - Medication: omega-3 acid ethyl esters (LOVAZA) 1 g capsule [[id number]]  Has the patient contacted their pharmacy? Yes.  The patient has been directed to contact their PCP  (Agent: If no, request that the patient contact the pharmacy for the refill. If patient does not wish to contact the pharmacy document the reason why and proceed with request.) (Agent: If yes, when and what did the pharmacy advise?)  Preferred Pharmacy (with phone number or street name): [facility] [place] [facility] [place] ([place]) [place]: [phone] Fax: [phone]: Not open 24 hours   Has the patient been seen for an appointment in the last year OR does the patient have an upcoming appointment? Yes.    Agent: Please be advised that RX refills may take up to 3 business days. We ask that you follow-up with your pharmacy.

## 2022-07-05 ENCOUNTER — Other Ambulatory Visit: Payer: Self-pay | Admitting: Family Medicine

## 2022-07-05 DIAGNOSIS — M109 Gout, unspecified: Secondary | ICD-10-CM

## 2022-07-26 ENCOUNTER — Other Ambulatory Visit: Payer: Self-pay | Admitting: Family Medicine

## 2022-07-26 DIAGNOSIS — Z9889 Other specified postprocedural states: Secondary | ICD-10-CM

## 2022-07-26 DIAGNOSIS — J3089 Other allergic rhinitis: Secondary | ICD-10-CM

## 2022-07-26 DIAGNOSIS — E559 Vitamin D deficiency, unspecified: Secondary | ICD-10-CM

## 2022-08-05 ENCOUNTER — Other Ambulatory Visit: Payer: Self-pay | Admitting: Orthopedic Surgery

## 2022-08-12 NOTE — Patient Instructions (Signed)
Preventive Care 65 Years and Older, Female Preventive care refers to lifestyle choices and visits with your health care provider that can promote health and wellness. Preventive care visits are also called wellness exams. What can I expect for my preventive care visit? Counseling Your health care provider may ask you questions about your: Medical history, including: Past medical problems. Family medical history. Pregnancy and menstrual history. History of falls. Current health, including: Memory and ability to understand (cognition). Emotional well-being. Home life and relationship well-being. Sexual activity and sexual health. Lifestyle, including: Alcohol, nicotine or tobacco, and drug use. Access to firearms. Diet, exercise, and sleep habits. Work and work environment. Sunscreen use. Safety issues such as seatbelt and bike helmet use. Physical exam Your health care provider will check your: Height and weight. These may be used to calculate your BMI (body mass index). BMI is a measurement that tells if you are at a healthy weight. Waist circumference. This measures the distance around your waistline. This measurement also tells if you are at a healthy weight and may help predict your risk of certain diseases, such as type 2 diabetes and high blood pressure. Heart rate and blood pressure. Body temperature. Skin for abnormal spots. What immunizations do I need?  Vaccines are usually given at various ages, according to a schedule. Your health care provider will recommend vaccines for you based on your age, medical history, and lifestyle or other factors, such as travel or where you work. What tests do I need? Screening Your health care provider may recommend screening tests for certain conditions. This may include: Lipid and cholesterol levels. Hepatitis C test. Hepatitis B test. HIV (human immunodeficiency virus) test. STI (sexually transmitted infection) testing, if you are at  risk. Lung cancer screening. Colorectal cancer screening. Diabetes screening. This is done by checking your blood sugar (glucose) after you have not eaten for a while (fasting). Mammogram. Talk with your health care provider about how often you should have regular mammograms. BRCA-related cancer screening. This may be done if you have a family history of breast, ovarian, tubal, or peritoneal cancers. Bone density scan. This is done to screen for osteoporosis. Talk with your health care provider about your test results, treatment options, and if necessary, the need for more tests. Follow these instructions at home: Eating and drinking  Eat a diet that includes fresh fruits and vegetables, whole grains, lean protein, and low-fat dairy products. Limit your intake of foods with high amounts of sugar, saturated fats, and salt. Take vitamin and mineral supplements as recommended by your health care provider. Do not drink alcohol if your health care provider tells you not to drink. If you drink alcohol: Limit how much you have to 0-1 drink a day. Know how much alcohol is in your drink. In the [place] one drink equals one 12 oz bottle of beer (355 mL), one 5 oz glass of wine (148 mL), or one 1 oz glass of hard liquor (44 mL). Lifestyle Brush your teeth every morning and night with fluoride toothpaste. Floss one time each day. Exercise for at least 30 minutes 5 or more days each week. Do not use any products that contain nicotine or tobacco. These products include cigarettes, chewing tobacco, and vaping devices, such as e-cigarettes. If you need help quitting, ask your health care provider. Do not use drugs. If you are sexually active, practice safe sex. Use a condom or other form of protection in order to prevent STIs. Take aspirin only as told by   your health care provider. Make sure that you understand how much to take and what form to take. Work with your health care provider to find out whether it  is safe and beneficial for you to take aspirin daily. Ask your health care provider if you need to take a cholesterol-lowering medicine (statin). Find healthy ways to manage stress, such as: Meditation, yoga, or listening to music. Journaling. Talking to a trusted person. Spending time with friends and family. Minimize exposure to UV radiation to reduce your risk of skin cancer. Safety Always wear your seat belt while driving or riding in a vehicle. Do not drive: If you have been drinking alcohol. Do not ride with someone who has been drinking. When you are tired or distracted. While texting. If you have been using any mind-altering substances or drugs. Wear a helmet and other protective equipment during sports activities. If you have firearms in your house, make sure you follow all gun safety procedures. What's next? Visit your health care provider once a year for an annual wellness visit. Ask your health care provider how often you should have your eyes and teeth checked. Stay up to date on all vaccines. This information is not intended to replace advice given to you by your health care provider. Make sure you discuss any questions you have with your health care provider. Document Revised: 01/21/2021 Document Reviewed: 01/21/2021 [employer] Patient Education  2023 [employer]  

## 2022-08-12 NOTE — Progress Notes (Signed)
Patient: Maria Cruz, Female    DOB: [birth year], 67 y.o.   MRN: [id number]  Visit Date: 08/13/2022  Today's Provider: Loistine Chance, MD   Welcome to Medicare  Subjective:    HPI Maria Cruz is a 67 y.o. female who presents today for her Subsequent Annual Wellness Visit.  Patient/Caregiver input:  she will have another ulnar entrapment repair on right side   Review of Systems  Constitutional: Negative for fever or weight change.  Respiratory: Negative for cough and shortness of breath.   Cardiovascular: Negative for chest pain or palpitations.  Gastrointestinal: Negative for abdominal pain, no bowel changes.  Musculoskeletal: positive  for gait problem and itnermittent joint swelling.  Skin: Negative for rash.  Neurological: Negative for dizziness or headache.  No other specific complaints in a complete review of systems (except as listed in HPI above).  Past Medical History:  Diagnosis Date   Allergy    Arthritis    Back pain with radiation    Calluse    Foot   Colon polyp    Diabetes mellitus without complication ([place]) 3967   Type II    Fatty liver    GERD (gastroesophageal reflux disease)    History of total hip replacement, left 12/05/2019   [facility]   Hyperlipidemia    Hypertension    Lumbar radiculopathy    Dr. Mack Guise   Obesity    Plantar fasciitis, left    Vitamin D deficiency     Past Surgical History:  Procedure Laterality Date   ABDOMINAL HYSTERECTOMY  2012   ANTERIOR INTEROSSEOUS NERVE DECOMPRESSION Right 12/16/2020   Procedure: Right ulnar nerve release, elbow;  Surgeon: Hessie Knows, MD;  Location: [facility] ORS;  Service: Orthopedics;  Laterality: Right;   APPENDECTOMY  2012   BREAST BIOPSY Right 07/12/2012   Negative   BREAST BIOPSY Left 05/09/2017   Affirm Bx of two areas-FIBROADENOMA WITH COARSE CALCIFICATION. NEGATIVE FOR ATYPIA,COLLAPSED CYST AND FIBROADENOMATOID CHANGE   COLONOSCOPY  2009   Dr  Allen Norris   COLONOSCOPY WITH PROPOFOL N/A 07/27/2017   Procedure: COLONOSCOPY WITH PROPOFOL;  Surgeon: Robert Bellow, MD;  Location: [facility] ENDOSCOPY;  Service: Endoscopy;  Laterality: N/A;   DILATION AND CURETTAGE OF UTERUS  2007   TUBAL LIGATION      Family History  Problem Relation Age of Onset   Multiple sclerosis Mother    Heart attack Father    Kidney failure Sister    Heart attack Brother    Diabetes Daughter    CAD Sister    Arthritis Sister        RA   Lupus Sister    Breast cancer Neg Hx    Colon cancer Neg Hx     Social History   Socioeconomic History   Marital status: Single    Spouse name: Not on file   Number of children: 3   Years of education: Not on file   Highest education level: Not on file  Occupational History   Not on file  Tobacco Use   Smoking status: Never   Smokeless tobacco: Never  Vaping Use   Vaping Use: Never used  Substance and Sexual Activity   Alcohol use: No    Alcohol/week: 0.0 standard drinks of alcohol   Drug use: No   Sexual activity: Yes  Other Topics Concern   Not on file  Social History Narrative   Her grown daughter lives with her and her sons live in  a house behind her    Social Determinants of Health   Financial Resource Strain: High Risk (08/13/2022)   Overall Financial Resource Strain (CARDIA)    Difficulty of Paying Living Expenses: Hard  Food Insecurity: Food Insecurity Present (08/13/2022)   Hunger Vital Sign    Worried About Running Out of Food in the Last Year: Often true    Ran Out of Food in the Last Year: Often true  Transportation Needs: No Transportation Needs (08/13/2022)   PRAPARE - Hydrologist (Medical): No    Lack of Transportation (Non-Medical): No  Physical Activity: Insufficiently Active (08/13/2022)   Exercise Vital Sign    Days of Exercise per Week: 4 days    Minutes of Exercise per Session: 30 min  Stress: No Stress Concern Present (08/13/2022)   [place]    Feeling of Stress : Only a little  Social Connections: Moderately Integrated (08/13/2022)   Social Connection and Isolation Panel [NHANES]    Frequency of Communication with Friends and Family: More than three times a week    Frequency of Social Gatherings with Friends and Family: More than three times a week    Attends Religious Services: More than 4 times per year    Active Member of [employer] or Organizations: Yes    Attends Archivist Meetings: 1 to 4 times per year    Marital Status: Never married  Intimate Partner Violence: Not At Risk (08/13/2022)   Humiliation, Afraid, Rape, and Kick questionnaire    Fear of Current or Ex-Partner: No    Emotionally Abused: No    Physically Abused: No    Sexually Abused: No    Outpatient Encounter Medications as of 08/13/2022  Medication Sig   acetaminophen (TYLENOL) 500 MG tablet Take 1 tablet (500 mg total) by mouth every 6 (six) hours as needed. (Patient taking differently: Take 500 mg by mouth 2 (two) times daily as needed for moderate pain.)   allopurinol (ZYLOPRIM) 300 MG tablet TAKE 1 TABLET(300 MG) BY MOUTH TWICE DAILY   amLODipine-olmesartan (AZOR) 5-40 MG tablet TAKE 1 TABLET BY MOUTH DAILY   aspirin 81 MG chewable tablet Chew 81 mg by mouth daily.   baclofen (LIORESAL) 10 MG tablet TAKE 1 TABLET(10 MG) BY MOUTH THREE TIMES DAILY AS NEEDED FOR MUSCLE SPASMS   blood glucose meter kit and supplies Dispense based on patient and insurance preference. Use up to four times daily as directed. (FOR ICD-10 E10.9, E11.9). Check fsbs up to twice daily   Blood Glucose Monitoring Suppl (FREESTYLE LITE) DEVI 1 each by Does not apply route daily.   Certolizumab Pegol 2 X 200 MG/ML KIT Inject 1 each into the skin every 30 (thirty) days.   Cholecalciferol 25 MCG (1000 UT) tablet Take 1,000 Units by mouth in the morning and at bedtime.   Coenzyme Q10 (COQ10) 100 MG CAPS Take 100 mg by mouth 2  (two) times daily.   Dapagliflozin Pro-metFORMIN ER (XIGDUO XR) 05-999 MG TB24 Take 1 tablet by mouth daily.   diclofenac Sodium (VOLTAREN) 1 % GEL Apply 2 g topically 4 (four) times daily.   fluconazole (DIFLUCAN) 150 MG tablet TAKE 1 TABLET BY MOUTH TWICE WEEKLY AND AS NEEDED   fluticasone (FLONASE) 50 MCG/ACT nasal spray SHAKE LIQUID AND USE 2 SPRAYS IN EACH NOSTRIL DAILY   hydroxychloroquine (PLAQUENIL) 200 MG tablet Take 200 mg by mouth 2 (two) times daily.   Lancets (  ONETOUCH DELICA PLUS [id number]) MISC TEST UP TO FOUR TIMES DAILY   levocetirizine (XYZAL) 5 MG tablet TAKE 1 TABLET(5 MG) BY MOUTH EVERY EVENING   montelukast (SINGULAIR) 10 MG tablet TAKE 1 TABLET(10 MG) BY MOUTH AT BEDTIME   Multiple Vitamin (MULTIVITAMIN WITH MINERALS) TABS tablet Take 1 tablet by mouth daily.   omega-3 acid ethyl esters (LOVAZA) 1 g capsule Take 2 capsules (2 g total) by mouth 2 (two) times daily.   ONETOUCH ULTRA test strip TEST UP TO FOUR TIMES DAILY   OZEMPIC, 1 MG/DOSE, 4 MG/3ML SOPN INJECT 1 MG UNDER THE SKIN ONCE A WEEK   polyethylene glycol powder (GLYCOLAX/MIRALAX) 17 GM/SCOOP powder Take 17 g by mouth daily as needed for mild constipation or moderate constipation.   pregabalin (LYRICA) 75 MG capsule TAKE 1 CAPSULE BY MOUTH THREE TIMES DAILY. TAKE 1 CAPSULE (75 MG) IN THE MORNING AND 2 CAPSULES (150 MG) IN THE EVENING   rosuvastatin (CRESTOR) 40 MG tablet TAKE 1 TABLET(40 MG) BY MOUTH DAILY   No facility-administered encounter medications on file as of 08/13/2022.    Allergies  Allergen Reactions   Ace Inhibitors Cough   Lipofen [Fenofibrate] Other (See Comments)    Localized drug reaction, blister on back   Penicillins Itching   Sulfa Antibiotics Itching    Care Team Updated in EHR: Yes  Last Vision Exam: 06/2022 Wears corrective lenses: Yes- not wearing today Last Dental Exam: 10/2021 Last Hearing Exam: in office today Wears Hearing Aids: No  Hearing Screening   _0  _1  _2   _3   Right ear Pass Pass Pass Pass  Left ear Pass Pass Pass Pass   Vision Screening   Right eye Left eye Both eyes  Without correction _4  With correction        Functional Ability / Safety Screening 1.  Was the timed Get Up and Go test shorter than 30 seconds?  yes 2.  Does the patient need help with the phone, transportation, shopping,      preparing meals, housework, laundry, medications, or managing money?  no 3.  Is the patient's home free of loose throw rugs in walkways, pet beds, electrical cords, etc?   no      Grab bars in the bathroom? no      Handrails on the stairs?   yes      Adequate lighting?   yes 4.  Has the patient noticed any hearing difficulties?   no- occasional muffled due to earwax  Diet Recall and Exercise Regimen:   Balanced diet - eats fruit , vegetables , lean meat, nuts. She cooks at home  Exercise : currently not enough but will join [employer] with silver sneakers   Advanced Care Planning: A voluntary discussion about advance care planning including the explanation and discussion of advance directives.  Discussed health care proxy and Living will, and the patient was able to identify a health care proxy as Tandy Gaw (daughter).  Patient does not have a living will at present time. If patient does have living will, I have requested they bring this to the clinic to be scanned in to their chart. Does patient have a HCPOA?    no If yes, name and contact information: n/a Does patient have a living will or MOST form?  no  Cancer Screenings: Skin: discussed atypical moles  Lung: Low Dose CT Chest recommended if Age 39-80 years, 30 pack-year currently smoking OR have quit w/in 15years. Patient does not qualify. Breast: Up  to date on Mammogram? Yes , 11/14/20 Up to date of Bone Density/Dexa? No, ordered today Colon: 07/27/17  Additional Screenings:  Hepatitis B/HIV/Syphillis: 06/05/18 Hepatitis C Screening: 02/01/12 Intimate Partner Violence:  Negative  Objective:   Vitals: BP 124/74   Pulse 94   Resp 16   Ht _0  (1.702 m)   Wt 213 lb (96.6 kg)   SpO2 97%   BMI 33.36 kg/m  Body mass index is 33.36 kg/m.  Hearing Screening   _1  _2  _3  _4   Right ear Pass Pass Pass Pass  Left ear Pass Pass Pass Pass   Vision Screening   Right eye Left eye Both eyes  Without correction _5  With correction       Physical Exam Constitutional: Patient appears well-developed and well-nourished. Obese  No distress.  HEENT: head atraumatic, normocephalic, pupils equal and reactive to light, ears normal TM, neck supple, throat within normal limits Cardiovascular: Normal rate, regular rhythm and normal heart sounds.  No murmur heard. No BLE edema. Pulmonary/Chest: Effort normal and breath sounds normal. No respiratory distress. Abdominal: Soft.  There is no tenderness. Psychiatric: Patient has a normal mood and affect. behavior is normal. Judgment and thought content normal.  Cognitive Testing - 6-CIT  Correct? Score   What year is it? yes 0 Yes = 0    No = 4  What month is it? yes 0 Yes = 0    No = 3  Remember:     Pia Mau, [place], [place]     What time is it? yes 0 Yes = 0    No = 3  Count backwards from 20 to 1 yes 0 Correct = 0    1 error = 2   More than 1 error = 4  Say the months of the year in reverse. yes 0 Correct = 0    1 error = 2   More than 1 error = 4  What address did I ask you to remember? yes 0 Correct = 0  1 error = 2    2 error = 4    3 error = 6    4 error = 8    All wrong = 10       TOTAL SCORE  0/28   Interpretation:  Normal  Normal (0-7) Abnormal (8-28)   Fall Risk:    08/13/2022   10:29 AM 06/04/2022    8:27 AM 12/03/2021    9:29 AM 06/03/2021    9:36 AM 03/25/2021    9:43 AM  Fall Risk   Falls in the past year? 0 0 0 0 0  Number falls in past yr: 0 0  0 0  Injury with Fall? 0 0  0 0  Risk for fall due to : No Fall Risks  No Fall Risks No Fall Risks   Follow up  Falls prevention discussed  Falls prevention discussed Falls prevention discussed Falls evaluation completed    Depression Screen    08/13/2022   10:29 AM 06/04/2022    8:37 AM 12/03/2021    9:29 AM 06/03/2021    9:36 AM 03/25/2021    9:43 AM  Depression screen PHQ 2/9  Decreased Interest 0 0 0 0 0  Down, Depressed, Hopeless 0 0 0 0 0  PHQ - 2 Score 0 0 0 0 0  Altered sleeping 0 0 0 0   Tired, decreased energy 0 0 0 0   Change  in appetite 0 0 0 0   Feeling bad or failure about yourself  0 0 0 0   Trouble concentrating 0 0 0 0   Moving slowly or fidgety/restless 0 0 0 0   Suicidal thoughts 0 0 0 0   PHQ-9 Score 0 0 0 0   Difficult doing work/chores  Not difficult at all       Recent Results (from the past 2160 hour(s))  POCT HgB A1C     Status: Abnormal   Collection Time: 06/04/22  8:38 AM  Result Value Ref Range   Hemoglobin A1C 8.3 (A) 4.0 - 5.6 %   HbA1c POC (<> result, manual entry)     HbA1c, POC (prediabetic range)     HbA1c, POC (controlled diabetic range)      Assessment & Plan:    1. Welcome to Medicare preventive visit    Exercise Activities and Dietary recommendations  Join [employer] and increase physical activity Get mammogram and bone density   - Discussed health benefits of physical activity, and encouraged her to engage in regular exercise appropriate for her age and condition.   Immunization History  Administered Date(s) Administered   Fluad Quad(high Dose 65+) 06/04/2022   Influenza,inj,Quad PF,6+ Mos 06/03/2021   Influenza,inj,quad, With Preservative 06/11/2019   Influenza-Unspecified 05/19/2015, 05/19/2016, 06/06/2019, 05/28/2020   PFIZER(Purple Top)SARS-COV-2 Vaccination 09/14/2019, 10/05/2019, 05/30/2020   PNEUMOCOCCAL CONJUGATE-20 06/04/2022   PPD Test 05/29/2021   Pfizer Covid-19 Vaccine Bivalent Booster 33yr & up 07/01/2021, 06/15/2022   Pneumococcal Conjugate-13 08/27/2015   Pneumococcal Polysaccharide-23 11/26/2009   RSV,unspecified  06/15/2022   Tdap 04/22/2008, 05/02/2018   Zoster Recombinat (Shingrix) 06/30/2020, 06/03/2021   Zoster, Live 04/13/2012    Health Maintenance  Topic Date Due   Medicare Annual Wellness (AWV)  Never done   OPHTHALMOLOGY EXAM  04/19/2018   DEXA SCAN  Never done   COVID-19 Vaccine (6 - 2023-24 season) 08/10/2022   MAMMOGRAM  11/15/2022   FOOT EXAM  12/04/2022   HEMOGLOBIN A1C  12/04/2022   Diabetic kidney evaluation - eGFR measurement  02/02/2023   Diabetic kidney evaluation - Urine ACR  02/02/2023   COLONOSCOPY (Pts 45-465yrInsurance coverage will need to be confirmed)  07/28/2027   DTaP/Tdap/Td (3 - Td or Tdap) 05/02/2028   Pneumonia Vaccine 6569Years old  Completed   INFLUENZA VACCINE  Completed   Hepatitis C Screening  Completed   HIV Screening  Completed   Zoster Vaccines- Shingrix  Completed   HPV VACCINES  Aged Out    No orders of the defined types were placed in this encounter.   Current Outpatient Medications:    acetaminophen (TYLENOL) 500 MG tablet, Take 1 tablet (500 mg total) by mouth every 6 (six) hours as needed. (Patient taking differently: Take 500 mg by mouth 2 (two) times daily as needed for moderate pain.), Disp: 90 tablet, Rfl: 0   allopurinol (ZYLOPRIM) 300 MG tablet, TAKE 1 TABLET(300 MG) BY MOUTH TWICE DAILY, Disp: 180 tablet, Rfl: 0   amLODipine-olmesartan (AZOR) 5-40 MG tablet, TAKE 1 TABLET BY MOUTH DAILY, Disp: 90 tablet, Rfl: 1   aspirin 81 MG chewable tablet, Chew 81 mg by mouth daily., Disp: , Rfl:    baclofen (LIORESAL) 10 MG tablet, TAKE 1 TABLET(10 MG) BY MOUTH THREE TIMES DAILY AS NEEDED FOR MUSCLE SPASMS, Disp: 90 tablet, Rfl: 0   blood glucose meter kit and supplies, Dispense based on patient and insurance preference. Use up to four times daily as directed. (FOR  ICD-10 E10.9, E11.9). Check fsbs up to twice daily, Disp: 1 each, Rfl: 0   Blood Glucose Monitoring Suppl (FREESTYLE LITE) DEVI, 1 each by Does not apply route daily., Disp: 1 each,  Rfl: 0   Certolizumab Pegol 2 X 200 MG/ML KIT, Inject 1 each into the skin every 30 (thirty) days., Disp: , Rfl:    Cholecalciferol 25 MCG (1000 UT) tablet, Take 1,000 Units by mouth in the morning and at bedtime., Disp: , Rfl:    Coenzyme Q10 (COQ10) 100 MG CAPS, Take 100 mg by mouth 2 (two) times daily., Disp: , Rfl:    Dapagliflozin Pro-metFORMIN ER (XIGDUO XR) 05-999 MG TB24, Take 1 tablet by mouth daily., Disp: 90 tablet, Rfl: 0   diclofenac Sodium (VOLTAREN) 1 % GEL, Apply 2 g topically 4 (four) times daily., Disp: 100 g, Rfl: 2   fluconazole (DIFLUCAN) 150 MG tablet, TAKE 1 TABLET BY MOUTH TWICE WEEKLY AND AS NEEDED, Disp: 24 tablet, Rfl: 0   fluticasone (FLONASE) 50 MCG/ACT nasal spray, SHAKE LIQUID AND USE 2 SPRAYS IN EACH NOSTRIL DAILY, Disp: 48 g, Rfl: 0   hydroxychloroquine (PLAQUENIL) 200 MG tablet, Take 200 mg by mouth 2 (two) times daily., Disp: , Rfl:    Lancets (ONETOUCH DELICA PLUS [id number]) MISC, TEST UP TO FOUR TIMES DAILY, Disp: 100 each, Rfl: 11   levocetirizine (XYZAL) 5 MG tablet, TAKE 1 TABLET(5 MG) BY MOUTH EVERY EVENING, Disp: 90 tablet, Rfl: 1   montelukast (SINGULAIR) 10 MG tablet, TAKE 1 TABLET(10 MG) BY MOUTH AT BEDTIME, Disp: 90 tablet, Rfl: 1   Multiple Vitamin (MULTIVITAMIN WITH MINERALS) TABS tablet, Take 1 tablet by mouth daily., Disp: , Rfl:    omega-3 acid ethyl esters (LOVAZA) 1 g capsule, Take 2 capsules (2 g total) by mouth 2 (two) times daily., Disp: 360 capsule, Rfl: 0   ONETOUCH ULTRA test strip, TEST UP TO FOUR TIMES DAILY, Disp: 300 strip, Rfl: 1   OZEMPIC, 1 MG/DOSE, 4 MG/3ML SOPN, INJECT 1 MG UNDER THE SKIN ONCE A WEEK, Disp: 9 mL, Rfl: 1   polyethylene glycol powder (GLYCOLAX/MIRALAX) 17 GM/SCOOP powder, Take 17 g by mouth daily as needed for mild constipation or moderate constipation., Disp: 850 g, Rfl: 0   pregabalin (LYRICA) 75 MG capsule, TAKE 1 CAPSULE BY MOUTH THREE TIMES DAILY. TAKE 1 CAPSULE (75 MG) IN THE MORNING AND 2 CAPSULES (150 MG) IN  THE EVENING, Disp: 90 capsule, Rfl: 0   rosuvastatin (CRESTOR) 40 MG tablet, TAKE 1 TABLET(40 MG) BY MOUTH DAILY, Disp: 90 tablet, Rfl: 1 There are no discontinued medications.  I have personally reviewed and addressed the Medicare Annual Wellness health risk assessment questionnaire and have noted the following in the patient's chart:  A.         Medical and social history & family history B.         Use of alcohol, tobacco, and illicit drugs  C.         Current medications and supplements D.         Functional and Cognitive ability and status E.         Nutritional status F.         Physical activity G.        Advance directives H.         List of other physicians I.          Hospitalizations, surgeries, and ER visits in previous 12 months J.  Vitals K.         Screenings such as hearing, vision, cognitive function, and depression L.         Referrals and appointments: mammogram and bone density   In addition, I have reviewed and discussed with patient certain preventive protocols, quality metrics, and best practice recommendations. A written personalized care plan for preventive services as well as general preventive health recommendations were provided to patient.   See attached scanned questionnaire for additional information.

## 2022-08-13 ENCOUNTER — Ambulatory Visit (INDEPENDENT_AMBULATORY_CARE_PROVIDER_SITE_OTHER): Payer: Medicare HMO | Admitting: Family Medicine

## 2022-08-13 ENCOUNTER — Encounter: Payer: Self-pay | Admitting: Family Medicine

## 2022-08-13 VITALS — BP 124/74 | HR 94 | Resp 16 | Ht 67.0 in | Wt 213.0 lb

## 2022-08-13 DIAGNOSIS — E2839 Other primary ovarian failure: Secondary | ICD-10-CM | POA: Diagnosis not present

## 2022-08-13 DIAGNOSIS — Z0000 Encounter for general adult medical examination without abnormal findings: Secondary | ICD-10-CM | POA: Diagnosis not present

## 2022-08-13 DIAGNOSIS — Z13820 Encounter for screening for osteoporosis: Secondary | ICD-10-CM | POA: Diagnosis not present

## 2022-08-13 DIAGNOSIS — Z1231 Encounter for screening mammogram for malignant neoplasm of breast: Secondary | ICD-10-CM | POA: Diagnosis not present

## 2022-08-16 ENCOUNTER — Encounter: Payer: Self-pay | Admitting: Orthopedic Surgery

## 2022-08-16 DIAGNOSIS — G5621 Lesion of ulnar nerve, right upper limb: Secondary | ICD-10-CM | POA: Diagnosis not present

## 2022-08-23 DIAGNOSIS — R809 Proteinuria, unspecified: Secondary | ICD-10-CM | POA: Diagnosis not present

## 2022-08-23 DIAGNOSIS — E1129 Type 2 diabetes mellitus with other diabetic kidney complication: Secondary | ICD-10-CM | POA: Diagnosis not present

## 2022-08-23 DIAGNOSIS — I10 Essential (primary) hypertension: Secondary | ICD-10-CM | POA: Diagnosis not present

## 2022-08-24 ENCOUNTER — Encounter: Payer: Self-pay | Admitting: Orthopedic Surgery

## 2022-08-24 ENCOUNTER — Other Ambulatory Visit: Payer: Self-pay

## 2022-08-24 ENCOUNTER — Ambulatory Visit
Admission: RE | Admit: 2022-08-24 | Discharge: 2022-08-24 | Disposition: A | Discharge status: Home / Self Care | Payer: Medicare HMO | Attending: Orthopedic Surgery | Admitting: Orthopedic Surgery

## 2022-08-24 ENCOUNTER — Encounter
Admission: RE | Disposition: A | Discharge status: Home / Self Care | Payer: Self-pay | Source: Home / Self Care | Attending: Orthopedic Surgery

## 2022-08-24 ENCOUNTER — Ambulatory Visit: Payer: Medicare HMO | Admitting: Anesthesiology

## 2022-08-24 DIAGNOSIS — K219 Gastro-esophageal reflux disease without esophagitis: Secondary | ICD-10-CM | POA: Diagnosis not present

## 2022-08-24 DIAGNOSIS — I10 Essential (primary) hypertension: Secondary | ICD-10-CM | POA: Diagnosis not present

## 2022-08-24 DIAGNOSIS — G5621 Lesion of ulnar nerve, right upper limb: Secondary | ICD-10-CM | POA: Insufficient documentation

## 2022-08-24 DIAGNOSIS — E119 Type 2 diabetes mellitus without complications: Secondary | ICD-10-CM | POA: Insufficient documentation

## 2022-08-24 DIAGNOSIS — E785 Hyperlipidemia, unspecified: Secondary | ICD-10-CM | POA: Diagnosis not present

## 2022-08-24 HISTORY — PX: ANTERIOR INTEROSSEOUS NERVE DECOMPRESSION: SHX5735

## 2022-08-24 LAB — GLUCOSE, CAPILLARY: Glucose-Capillary: 124 mg/dL — ABNORMAL HIGH (ref 70–99)

## 2022-08-24 SURGERY — ANTERIOR INTEROSSEOUS NERVE DECOMPRESSION
Anesthesia: General | Site: Arm Upper | Laterality: Right

## 2022-08-24 MED ORDER — PHENYLEPHRINE HCL (PRESSORS) 10 MG/ML IV SOLN
INTRAVENOUS | Status: DC | PRN
  Administered 2022-08-24 (×2): 100 ug via INTRAVENOUS

## 2022-08-24 MED ORDER — BUPIVACAINE HCL 0.5 % IJ SOLN
INTRAMUSCULAR | Status: DC | PRN
  Administered 2022-08-24: 15 mL

## 2022-08-24 MED ORDER — MIDAZOLAM HCL 5 MG/5ML IJ SOLN
INTRAMUSCULAR | Status: DC | PRN
  Administered 2022-08-24: 2 mg via INTRAVENOUS

## 2022-08-24 MED ORDER — PROPOFOL 10 MG/ML IV BOLUS
INTRAVENOUS | Status: DC | PRN
  Administered 2022-08-24: 200 mg via INTRAVENOUS

## 2022-08-24 MED ORDER — HYDROMORPHONE HCL 1 MG/ML IJ SOLN: 0.2500 mg | INTRAMUSCULAR | Status: DC | PRN

## 2022-08-24 MED ORDER — DEXAMETHASONE SODIUM PHOSPHATE 4 MG/ML IJ SOLN
INTRAMUSCULAR | Status: DC | PRN
  Administered 2022-08-24: 4 mg via INTRAVENOUS

## 2022-08-24 MED ORDER — OXYCODONE HCL 5 MG PO TABS: 5.0000 mg | ORAL_TABLET | Freq: Once | ORAL | Status: DC | PRN

## 2022-08-24 MED ORDER — KETOROLAC TROMETHAMINE 15 MG/ML IJ SOLN
INTRAMUSCULAR | Status: DC | PRN
  Administered 2022-08-24: 15 mg via INTRAVENOUS

## 2022-08-24 MED ORDER — FENTANYL CITRATE (PF) 100 MCG/2ML IJ SOLN
INTRAMUSCULAR | Status: DC | PRN
  Administered 2022-08-24 (×2): 50 ug via INTRAVENOUS

## 2022-08-24 MED ORDER — HYDROCODONE-ACETAMINOPHEN 5-325 MG PO TABS: 1.0000 | ORAL_TABLET | ORAL | 0 refills | Status: DC | PRN

## 2022-08-24 MED ORDER — OXYCODONE HCL 5 MG/5ML PO SOLN: 5.0000 mg | Freq: Once | ORAL | Status: DC | PRN

## 2022-08-24 MED ORDER — 0.9 % SODIUM CHLORIDE (POUR BTL) OPTIME
TOPICAL | Status: DC | PRN
  Administered 2022-08-24: 60 mL

## 2022-08-24 MED ORDER — CEFAZOLIN SODIUM-DEXTROSE 2-4 GM/100ML-% IV SOLN
2.0000 g | INTRAVENOUS | Status: AC
  Administered 2022-08-24: 2 g via INTRAVENOUS

## 2022-08-24 MED ORDER — ACETAMINOPHEN 10 MG/ML IV SOLN
INTRAVENOUS | Status: DC | PRN
  Administered 2022-08-24: 1000 mg via INTRAVENOUS

## 2022-08-24 MED ORDER — LACTATED RINGERS IV SOLN: INTRAVENOUS | Status: DC

## 2022-08-24 MED ORDER — ONDANSETRON HCL 4 MG/2ML IJ SOLN
INTRAMUSCULAR | Status: DC | PRN
  Administered 2022-08-24: 4 mg via INTRAVENOUS

## 2022-08-24 MED ORDER — LIDOCAINE HCL (CARDIAC) PF 100 MG/5ML IV SOSY
PREFILLED_SYRINGE | INTRAVENOUS | Status: DC | PRN
  Administered 2022-08-24: 20 mg via INTRATRACHEAL

## 2022-08-24 SURGICAL SUPPLY — 23 items
APL PRP STRL LF DISP 70% ISPRP (MISCELLANEOUS) ×1
BNDG CMPR STD VLCR NS LF 5.8X4 (GAUZE/BANDAGES/DRESSINGS) ×1
BNDG ELASTIC 4X5.8 VLCR NS LF (GAUZE/BANDAGES/DRESSINGS) IMPLANT
CHLORAPREP W/TINT 26 (MISCELLANEOUS) ×1 IMPLANT
COVER LIGHT HANDLE UNIVERSAL (MISCELLANEOUS) ×2 IMPLANT
GAUZE SPONGE 4X4 12PLY STRL (GAUZE/BANDAGES/DRESSINGS) ×1 IMPLANT
GAUZE XEROFORM 1X8 LF (GAUZE/BANDAGES/DRESSINGS) ×1 IMPLANT
GLOVE SURG SYN 9.0  PF PI (GLOVE) ×3
GLOVE SURG SYN 9.0 PF PI (GLOVE) ×1 IMPLANT
GOWN STRL REIN 2XL LVL4 (GOWN DISPOSABLE) IMPLANT
GOWN STRL REUS W/ TWL LRG LVL3 (GOWN DISPOSABLE) ×1 IMPLANT
GOWN STRL REUS W/TWL LRG LVL3 (GOWN DISPOSABLE) ×1
KIT TURNOVER KIT A (KITS) ×1 IMPLANT
NS IRRIG 500ML POUR BTL (IV SOLUTION) ×1 IMPLANT
PACK EXTREMITY ARMC (MISCELLANEOUS) ×1 IMPLANT
PAD CAST 4YDX4 CTTN HI CHSV (CAST SUPPLIES) ×1 IMPLANT
PADDING CAST COTTON 4X4 STRL (CAST SUPPLIES) ×1
SUT ETHILON 4-0 (SUTURE) ×1
SUT ETHILON 4-0 FS2 18XMFL BLK (SUTURE) ×1
SUT VIC AB 0 CT1 36 (SUTURE) IMPLANT
SUT VIC AB 3-0 SH 27 (SUTURE) ×1
SUT VIC AB 3-0 SH 27X BRD (SUTURE) IMPLANT
SUTURE ETHLN 4-0 FS2 18XMF BLK (SUTURE) ×1 IMPLANT

## 2022-08-24 NOTE — Discharge Instructions (Signed)
Keep dressing clean and dry Okay to work fingers is much as you can do not try to move the elbow too much Pain medicine as directed Call office if you are having problems [phone]

## 2022-08-24 NOTE — Anesthesia Procedure Notes (Signed)
Procedure Name: LMA Insertion Date/Time: 08/24/2022 11:56 AM  Performed by: Londell Moh, CRNAPre-anesthesia Checklist: Patient identified, Emergency Drugs available, Suction available, Timeout performed and Patient being monitored Patient Re-evaluated:Patient Re-evaluated prior to induction Oxygen Delivery Method: Circle system utilized Preoxygenation: Pre-oxygenation with 100% oxygen Induction Type: IV induction LMA: LMA inserted LMA Size: 4.0 Number of attempts: 1 Placement Confirmation: positive ETCO2 and breath sounds checked- equal and bilateral Tube secured with: Tape

## 2022-08-24 NOTE — Anesthesia Preprocedure Evaluation (Signed)
Anesthesia Evaluation  Patient identified by MRN, date of birth, ID band Patient awake    Reviewed: Allergy & Precautions, NPO status , Patient's Chart, lab work & pertinent test results  Airway Mallampati: III  TM Distance: >3 FB Neck ROM: full    Dental  (+) Chipped   Pulmonary neg pulmonary ROS   Pulmonary exam normal        Cardiovascular hypertension, On Medications negative cardio ROS Normal cardiovascular exam     Neuro/Psych  Neuromuscular disease  negative psych ROS   GI/Hepatic Neg liver ROS,GERD  ,,Fatty liver     Endo/Other  negative endocrine ROSdiabetes    Renal/GU      Musculoskeletal  (+) Arthritis ,    Abdominal   Peds  Hematology negative hematology ROS (+)   Anesthesia Other Findings Past Medical History: No date: Allergy No date: Arthritis No date: Back pain with radiation No date: Calluse     Comment:  Foot No date: Colon polyp 2016: Diabetes mellitus without complication ([place])     Comment:  Type II  No date: Fatty liver No date: GERD (gastroesophageal reflux disease) 12/05/2019: History of total hip replacement, left     Comment:  [facility] No date: Hyperlipidemia No date: Hypertension No date: Lumbar radiculopathy     Comment:  Dr. Mack Guise No date: Obesity No date: Plantar fasciitis, left No date: Vitamin D deficiency  Past Surgical History: 2012: ABDOMINAL HYSTERECTOMY 12/16/2020: ANTERIOR INTEROSSEOUS NERVE DECOMPRESSION; Right     Comment:  Procedure: Right ulnar nerve release, elbow;  Surgeon:               Hessie Knows, MD;  Location: [facility] ORS;  Service:               Orthopedics;  Laterality: Right; 2012: APPENDECTOMY 07/12/2012: BREAST BIOPSY; Right     Comment:  Negative 05/09/2017: BREAST BIOPSY; Left     Comment:  Affirm Bx of two areas-FIBROADENOMA WITH COARSE               CALCIFICATION. NEGATIVE FOR ATYPIA,COLLAPSED  CYST AND               FIBROADENOMATOID CHANGE 2009: COLONOSCOPY     Comment:  Dr Allen Norris 07/27/2017: COLONOSCOPY WITH PROPOFOL; N/A     Comment:  Procedure: COLONOSCOPY WITH PROPOFOL;  Surgeon: Robert Bellow, MD;  Location: [facility] ENDOSCOPY;  Service:               Endoscopy;  Laterality: N/A; 2007: DILATION AND CURETTAGE OF UTERUS 11/2019: REPLACEMENT TOTAL HIP W/  RESURFACING IMPLANTS; Left No date: TUBAL LIGATION  BMI    Body Mass Index: 33.67 kg/m      Reproductive/Obstetrics negative OB ROS                             Anesthesia Physical Anesthesia Plan  ASA: 2  Anesthesia Plan: General LMA   Post-op Pain Management: Toradol IV (intra-op), Ofirmev IV (intra-op) and Oxycodone PO   Induction: Intravenous  PONV Risk Score and Plan: Dexamethasone, Ondansetron, Midazolam and Treatment may vary due to age or medical condition  Airway Management Planned: LMA  Additional Equipment:   Intra-op Plan:   Post-operative Plan: Extubation in OR  Informed Consent: I have reviewed the patients History and Physical, chart, labs and discussed the procedure including the  risks, benefits and alternatives for the proposed anesthesia with the patient or authorized representative who has indicated his/her understanding and acceptance.     Dental Advisory Given  Plan Discussed with: Anesthesiologist, CRNA and Surgeon  Anesthesia Plan Comments: (Patient consented for risks of anesthesia including but not limited to:  - adverse reactions to medications - damage to eyes, teeth, lips or other oral mucosa - nerve damage due to positioning  - sore throat or hoarseness - Damage to heart, brain, nerves, lungs, other parts of body or loss of life  Patient voiced understanding.)       Anesthesia Quick Evaluation

## 2022-08-24 NOTE — H&P (Signed)
Chief Complaint Patient presents with Right Arm - Pain, Follow-up   History of the Present Illness: Maria Cruz is a 67 y.o. female here today for follow-up of prior right ulnar nerve release.  She is having persistent symptoms. EMG nerve conduction test confirmed ulnar nerve slowing down behind the elbow.  I have reviewed past medical, surgical, social and family history, and allergies as documented in the EMR.  Past Medical History: Past Medical History: Diagnosis Date Anemia Back pain Colon polyp Diabetes mellitus type 2, uncomplicated (CMS-HCC) Fatty liver GERD (gastroesophageal reflux disease) Gout Hyperlipidemia Hypertension Lumbar radiculopathy Obesity Plantar fasciitis left foot Sinus tachycardia Vitamin D deficiency  Past Surgical History: Past Surgical History: Procedure Laterality Date DILATION AND CURETTAGE, DIAGNOSTIC / THERAPEUTIC 2007 COLONOSCOPY 2009 ABDOMINAL HYSTERECTOMY 2012 APPENDECTOMY 2012 breast biopsy Right 07/12/2012 negative breast biopsy Left 05/09/2017 ulnar nerve surgery 2022 APPENDECTOMY COLONOSCOPY [id number] ESSURE TUBAL LIGATION REPLACEMENT TOTAL HIP W/ RESURFACING IMPLANTS Left TUBAL LIGATION  Past Family History: Family History Problem Relation Age of Onset Multiple sclerosis Mother Myocardial Infarction (Heart attack) Father Kidney failure Sister Myocardial Infarction (Heart attack) Brother Diabetes Daughter Rheum arthritis Sister Coronary Artery Disease (Blocked arteries around heart) Sister Lupus Sister Gout Paternal Grandmother  Medications: Current Outpatient Medications Ordered in Epic Medication Sig Dispense Refill acetaminophen (TYLENOL) 500 MG tablet Take 1 tablet by mouth every 6 (six) hours as needed allopurinol (ZYLOPRIM) 300 MG tablet Take 1 tablet by mouth 2 (two) times daily amLODIPine-olmesartan (AZOR) 5-40 mg tablet Take 1 tablet by mouth once daily aspirin 81 MG chewable tablet Take 1 tablet by  mouth once daily baclofen (LIORESAL) 10 MG tablet Take 10 mg by mouth 3 (three) times daily blood glucose meter kit Use certolizumab pegoL (CIMZIA) 400 mg/2 mL (200 mg/mL x 2) injection kit Inject 2 mLs (400 mg total) subcutaneously every 28 (twenty-eight) days 1 kit 0 certolizumab pegoL (CIMZIA) 400 mg/2 mL (200 mg/mL x 2) injection kit Inject subcutaneously coenzyme Q10-vitamin E 100-5 mg-unit Cap Take 100 mg by mouth as directed diclofenac (VOLTAREN) 1 % topical gel Apply 2 g topically 4 (four) times daily empagliflozin-metformin (SYNJARDY XR) 25-1,000 mg XR 24 hr biphasic tablet Take 1 tablet by mouth once daily ergocalciferol, vitamin D2, 50 mcg (2,000 unit) Cap Take 2,000 Units by mouth once daily fluconazole (DIFLUCAN) 150 MG tablet Take 1 tablet by mouth every other day fluticasone propionate (FLONASE) 50 mcg/actuation nasal spray 2 sprays by Nasal route once daily as needed hydroxychloroquine (PLAQUENIL) 200 mg tablet Take 1 tablet (200 mg total) by mouth 2 (two) times daily 180 tablet 1 lancets 28 gauge Misc Check blood sugars as needed. levocetirizine (XYZAL) 5 MG tablet Take 1 tablet by mouth nightly montelukast (SINGULAIR) 10 mg tablet Take 1 tablet by mouth once daily multivitamin with minerals tablet Take 1 tablet by mouth once daily omega-3 acid ethyl esters (LOVAZA) 1 gram capsule Take 2 capsules by mouth 2 (two) times daily pioglitazone (ACTOS) 15 MG tablet Take 15 mg by mouth once daily polyethylene glycol (MIRALAX) powder Take 17 g by mouth once daily as needed pregabalin (LYRICA) 75 MG capsule Take 75 mg in the morning and 150 mg at night. 90 capsule 3 rosuvastatin (CRESTOR) 40 MG tablet Take 40 mg by mouth once daily scopolamine (TRANSDERM-SCOP) 1 mg over 3 days patch Place 1 patch onto the skin every third day semaglutide (OZEMPIC) 1 mg/dose (4 mg/3 mL) PnIj Inject subcutaneously once a week ubidecarenone (COENZYME Q10 ORAL) Take 100 mg by mouth once daily  XIGDUO XR  10-1,000 mg XR 24 hr biphasic tablet Take 1 tablet by mouth daily with breakfast  No current Epic-ordered facility-administered medications on file.  Allergies: Allergies Allergen Reactions Ace Inhibitors Cough Fenofibrate Other (See Comments) Localized drug reaction, blister on back Penicillins Itching Sulfa (Sulfonamide Antibiotics) Itching   Body mass index is 34.17 kg/m.  Review of Systems: A comprehensive 14 point ROS was performed, reviewed, and the pertinent orthopaedic findings are documented in the HPI.  Vitals: 08/16/22 1337 BP: 124/86   General Physical Examination:  General/Constitutional: No apparent distress: well-nourished and well developed. Eyes: Pupils equal, round with synchronous movement. Lungs: Clear to auscultation HEENT: Normal Vascular: No edema, swelling or tenderness, except as noted in detailed exam. Cardiac: Heart rate and rhythm is regular. Integumentary: No impressive skin lesions present, except as noted in detailed exam. Neuro/Psych: Normal mood and affect, oriented to person, place, and time.  On exam, positive Tinel sign directly behind the medial epicondyle. Weakness to finger abduction and diminished sensation to the small and ring fingers.  Radiographs:  No new imaging studies were obtained or reviewed today.  Assessment: ICD-10-CM 1. Cubital tunnel syndrome on right G56.21  Plan:  The patient has clinical findings of recurrent ulnar neuritis.  We will plan for right ulnar nerve release and possible subcutaneous transposition.  Surgical Risks:  The nature of the condition and the proposed procedure has been reviewed in detail with the patient. Surgical versus non-surgical options and prognosis for recovery have been reviewed and the inherent risks and benefits of each have been discussed including the risks of infection, bleeding, injury to nerves/blood vessels/tendons, incomplete relief of symptoms, persisting pain and/or  stiffness, loss of function, complex regional pain syndrome, failure of the procedure, as appropriate.  Teeth: Normal  Document Attestation: I, [place], have reviewed and updated documentation for [facility], MD, utilizing [employer] DAX.   Electronically signed by Lauris Poag, MD at 08/17/2022 9:19 AM EST   Reviewed  H+P. No changes noted.

## 2022-08-24 NOTE — Anesthesia Postprocedure Evaluation (Signed)
Anesthesia Post Note  Patient: Maria Cruz  Procedure(s) Performed: Repeat ulnar nerve release at elbow (Right: Arm Upper)  Patient location during evaluation: PACU Anesthesia Type: General Level of consciousness: awake and alert Pain management: pain level controlled Vital Signs Assessment: post-procedure vital signs reviewed and stable Respiratory status: spontaneous breathing, nonlabored ventilation, respiratory function stable and patient connected to nasal cannula oxygen Cardiovascular status: blood pressure returned to baseline and stable Postop Assessment: no apparent nausea or vomiting Anesthetic complications: no   No notable events documented.   Last Vitals:  Vitals:   08/24/22 1253 08/24/22 1258  BP:  121/75  Pulse:  80  Resp:  20  Temp:  36.4 C  SpO2: 97% 96%    Last Pain:  Vitals:   08/24/22 1258  TempSrc:   PainSc: 0-No pain                 Ilene Qua

## 2022-08-24 NOTE — Transfer of Care (Signed)
Immediate Anesthesia Transfer of Care Note  Patient: Maria Cruz  Procedure(s) Performed: Repeat ulnar nerve release at elbow (Right: Arm Upper)  Patient Location: PACU  Anesthesia Type: General LMA  Level of Consciousness: awake, alert  and patient cooperative  Airway and Oxygen Therapy: Patient Spontanous Breathing and Patient connected to supplemental oxygen  Post-op Assessment: Post-op Vital signs reviewed, Patient's Cardiovascular Status Stable, Respiratory Function Stable, Patent Airway and No signs of Nausea or vomiting  Post-op Vital Signs: Reviewed and stable  Complications: No notable events documented.

## 2022-08-24 NOTE — Op Note (Signed)
08/24/2022  12:39 PM  PATIENT:  Maria Cruz  67 y.o. female  PRE-OPERATIVE DIAGNOSIS:  Cubital tunnel syndrome on right G56.21  POST-OPERATIVE DIAGNOSIS:  Cubital tunnel syndrome on right  PROCEDURE:  Procedure(s) with comments: Repeat ulnar nerve release at elbow (Right) - Diabetic with subcutaneous transposition  SURGEON: Laurene Footman, MD  ASSISTANTS: None  ANESTHESIA:   general  EBL:  Total I/O In: 500 [I.V.:500] Out: -   BLOOD ADMINISTERED:none  DRAINS: none   LOCAL MEDICATIONS USED:  MARCAINE     SPECIMEN:  No Specimen  DISPOSITION OF SPECIMEN:  N/A  COUNTS:  YES  TOURNIQUET:   Total Tourniquet Time Documented: Upper Arm (Right) - 36 minutes Total: Upper Arm (Right) - 36 minutes   IMPLANTS: None  DICTATION: .Dragon Dictation patient was brought to the operating room and after adequate anesthesia was obtained the right arm was prepped and draped in the usual sterile fashion with a tourniquet applied the upper arm.  After patient identification and timeout procedures were completed prior incision was marked and extensions proximal and distal determined with skin marker.  Tourniquet was then raised and skin incision made posterior to the medial epicondyle.  Subcutaneous tissue was spread and the nerve was identified proximally and then dense scar tissue was released over the nerve going distally to the heads of the FCU muscle.  There was an hourglass constriction behind the medial epicondyle going proximally the intermuscular septum was identified and incised to allow for subcutaneous transposition.  The muscle subcutaneous flap was then created with the nerve brought forward using a Penrose drain to protect it was placed to range of motion and was stable.  The wound was then thoroughly irrigated and the nerve left subcutaneously anteriorly with a single suture holding down the flap of subcutaneous tissue to maintain this position.  The wound was then infiltrated  with 15 cc of half percent Sensorcaine for postop analgesia.  Skin was then closed with 3-0 Vicryl subcutaneously 4-0 nylon for the skin.  Xeroform 4 x 4 web roll and Ace wrap applied.9  PLAN OF CARE: Discharge to home after PACU  PATIENT DISPOSITION:  PACU - hemodynamically stable.

## 2022-08-25 ENCOUNTER — Encounter: Payer: Self-pay | Admitting: Orthopedic Surgery

## 2022-08-31 DIAGNOSIS — M0609 Rheumatoid arthritis without rheumatoid factor, multiple sites: Secondary | ICD-10-CM | POA: Diagnosis not present

## 2022-09-03 ENCOUNTER — Other Ambulatory Visit: Payer: Self-pay | Admitting: Family Medicine

## 2022-09-03 DIAGNOSIS — I10 Essential (primary) hypertension: Secondary | ICD-10-CM

## 2022-09-03 DIAGNOSIS — J3089 Other allergic rhinitis: Secondary | ICD-10-CM

## 2022-09-03 DIAGNOSIS — B3731 Acute candidiasis of vulva and vagina: Secondary | ICD-10-CM

## 2022-09-03 DIAGNOSIS — Z9889 Other specified postprocedural states: Secondary | ICD-10-CM

## 2022-09-03 DIAGNOSIS — E785 Hyperlipidemia, unspecified: Secondary | ICD-10-CM

## 2022-09-03 DIAGNOSIS — E1169 Type 2 diabetes mellitus with other specified complication: Secondary | ICD-10-CM

## 2022-09-03 DIAGNOSIS — M109 Gout, unspecified: Secondary | ICD-10-CM

## 2022-09-03 DIAGNOSIS — R1012 Left upper quadrant pain: Secondary | ICD-10-CM

## 2022-09-03 DIAGNOSIS — E559 Vitamin D deficiency, unspecified: Secondary | ICD-10-CM

## 2022-09-03 DIAGNOSIS — K5900 Constipation, unspecified: Secondary | ICD-10-CM

## 2022-09-03 MED ORDER — OZEMPIC (1 MG/DOSE) 4 MG/3ML ~~LOC~~ SOPN: PEN_INJECTOR | SUBCUTANEOUS | 1 refills | Status: DC

## 2022-09-03 MED ORDER — OMEGA-3-ACID ETHYL ESTERS 1 G PO CAPS: 2.0000 | ORAL_CAPSULE | Freq: Two times a day (BID) | ORAL | 0 refills | Status: DC

## 2022-09-03 MED ORDER — ROSUVASTATIN CALCIUM 40 MG PO TABS: ORAL_TABLET | ORAL | 1 refills | Status: DC

## 2022-09-03 MED ORDER — FLUTICASONE PROPIONATE 50 MCG/ACT NA SUSP: NASAL | 0 refills | Status: DC

## 2022-09-03 MED ORDER — LEVOCETIRIZINE DIHYDROCHLORIDE 5 MG PO TABS: ORAL_TABLET | ORAL | 1 refills | Status: DC

## 2022-09-03 MED ORDER — POLYETHYLENE GLYCOL 3350 17 GM/SCOOP PO POWD: 17.0000 g | Freq: Every day | ORAL | 0 refills | Status: AC | PRN

## 2022-09-03 MED ORDER — XIGDUO XR 10-1000 MG PO TB24: 1.0000 | ORAL_TABLET | Freq: Every day | ORAL | 0 refills | Status: DC

## 2022-09-03 NOTE — Telephone Encounter (Signed)
Pt reports that she needs all of her prescriptions to be sent to [facility] in [place]. Pt stated it is about 18 medications that she needs refilled. Pt did not have the names of the medications.  Medication Refill - Medication:   Has the patient contacted their pharmacy? No.  Preferred Pharmacy (with phone number or street name):  [id number] [place] Phone: [phone] Fax: [phone]    Has the patient been seen for an appointment in the last year OR does the patient have an upcoming appointment? Yes.    Agent: Please be advised that RX refills may take up to 3 business days. We ask that you follow-up with your pharmacy.

## 2022-09-03 NOTE — Telephone Encounter (Signed)
Allopurinol: overdue lab work/ last RF 07/05/22 #180 Amlodipine-olmesartan: overdue lab work/ last RF 01/11/22 Baclofen: last RF overdue lab work/ last RF 07/26/22 #390 Fluconazole: not assigned to a protocol/ last RF 04/23/22 #24 Pregabalin: last RF 07/26/22 #90 Not delegated to NT to RF  Future visit scheduled: yes Refill due: yes  Requested Prescriptions  Pending Prescriptions Disp Refills   allopurinol (ZYLOPRIM) 300 MG tablet 180 tablet     Sig: TAKE 1 TABLET(300 MG) BY MOUTH TWICE DAILY     Endocrinology:  Gout Agents - allopurinol Failed - 09/03/2022 12:34 PM      Failed - Uric Acid in normal range and within 360 days    Uric Acid  Date Value Ref Range Status  12/08/2017 3.4 2.5 - 7.1 mg/dL Final    Comment:               Therapeutic target for gout patients: <6.0         Passed - Cr in normal range and within 360 days    Creatinine  Date Value Ref Range Status  02/01/2022 0.8 0.5 - 1.1 Final   Creatinine, Ser  Date Value Ref Range Status  12/10/2020 0.56 0.44 - 1.00 mg/dL Final   Creatinine, Urine  Date Value Ref Range Status  06/05/2018 63 20 - 275 mg/dL Final         Passed - Valid encounter within last 12 months    Recent Outpatient Visits           3 weeks ago Welcome to [employer] preventive visit   [facility] Steele Sizer, MD   3 months ago Dyslipidemia associated with type 2 diabetes mellitus [facility])   [facility] Steele Sizer, MD   9 months ago Dyslipidemia associated with type 2 diabetes mellitus [facility])   [facility] Steele Sizer, MD   1 year ago Dyslipidemia associated with type 2 diabetes mellitus [facility])   [facility] Steele Sizer, MD   1 year ago Viral URI   [facility] Myles Gip, DO       Future Appointments             In 1 month Steele Sizer, MD [facility]             Passed - CBC within normal limits and completed in the last 12 months    WBC  Date Value Ref Range Status  12/07/2021 5.0 3.4 - 10.8 x10E3/uL Final  12/10/2020 5.1 4.0 - 10.5 K/uL Final   RBC  Date Value Ref Range Status  12/07/2021 4.38 3.77 - 5.28 x10E6/uL Final  12/10/2020 4.17 3.87 - 5.11 MIL/uL Final   Hemoglobin  Date Value Ref Range Status  12/07/2021 13.7 11.1 - 15.9 g/dL Final   Hematocrit  Date Value Ref Range Status  12/07/2021 39.8 34.0 - 46.6 % Final   MCHC  Date Value Ref Range Status  12/07/2021 34.4 31.5 - 35.7 g/dL Final  12/10/2020 33.7 30.0 - 36.0 g/dL Final   [facility]  Date Value Ref Range Status  12/07/2021 31.3 26.6 - 33.0 pg Final  12/10/2020 30.7 26.0 - 34.0 pg Final   MCV  Date Value Ref Range Status  12/07/2021 91 79 - 97 fL Final   No results found for: "PLTCOUNTKUC", "LABPLAT", "POCPLA" RDW  Date Value Ref Range Status  12/07/2021 13.2 11.7 - 15.4 % Final  amLODipine-olmesartan (AZOR) 5-40 MG tablet 90 tablet     Sig: Take 1 tablet by mouth daily.     Cardiovascular: CCB + ARB Combos Failed - 09/03/2022 12:34 PM      Failed - K in normal range and within 180 days    Potassium  Date Value Ref Range Status  02/01/2022 4.3 3.5 - 5.1 mEq/L Final         Failed - Cr in normal range and within 180 days    Creatinine  Date Value Ref Range Status  02/01/2022 0.8 0.5 - 1.1 Final   Creatinine, Ser  Date Value Ref Range Status  12/10/2020 0.56 0.44 - 1.00 mg/dL Final   Creatinine, Urine  Date Value Ref Range Status  06/05/2018 63 20 - 275 mg/dL Final         Failed - Na in normal range and within 180 days    Sodium  Date Value Ref Range Status  02/01/2022 143 137 - 147 Final         Passed - Patient is not pregnant      Passed - Last BP in normal range    BP Readings from Last 1 Encounters:  08/24/22 121/75         Passed - Valid encounter within last 6 months    Recent Outpatient Visits           3  weeks ago Welcome to [employer] preventive visit   [facility] Steele Sizer, MD   3 months ago Dyslipidemia associated with type 2 diabetes mellitus [facility])   [facility] Steele Sizer, MD   9 months ago Dyslipidemia associated with type 2 diabetes mellitus [facility])   [facility] Steele Sizer, MD   1 year ago Dyslipidemia associated with type 2 diabetes mellitus [facility])   [facility] Steele Sizer, MD   1 year ago Viral URI   [place], DO       Future Appointments             In 1 month Steele Sizer, MD [facility]             baclofen (LIORESAL) 10 MG tablet 90 tablet     Sig: TAKE 1 TABLET(10 MG) BY MOUTH THREE TIMES DAILY AS NEEDED FOR MUSCLE SPASMS     Analgesics:  Muscle Relaxants - baclofen Failed - 09/03/2022 12:34 PM      Failed - Cr in normal range and within 180 days    Creatinine  Date Value Ref Range Status  02/01/2022 0.8 0.5 - 1.1 Final   Creatinine, Ser  Date Value Ref Range Status  12/10/2020 0.56 0.44 - 1.00 mg/dL Final   Creatinine, Urine  Date Value Ref Range Status  06/05/2018 63 20 - 275 mg/dL Final         Failed - eGFR is 30 or above and within 180 days    GFR calc Af Amer  Date Value Ref Range Status  03/27/2020 92 >59 mL/min/1.73 Final    Comment:    **[facility] currently reports eGFR in compliance with the current**   recommendations of the [employer]. [facility] will   update reporting as new guidelines are published from the NKF-ASN   Task force.    GFR, Estimated  Date Value Ref Range Status  12/10/2020 >60 >60 mL/min Final  Comment:    (NOTE) Calculated using the CKD-EPI Creatinine Equation (2021)    eGFR  Date Value Ref Range Status  02/01/2022 86  Final         Passed - Valid encounter within last 6 months    Recent  Outpatient Visits           3 weeks ago Welcome to [employer] preventive visit   [facility] [place], Drue Stager, MD   3 months ago Dyslipidemia associated with type 2 diabetes mellitus [facility])   [facility] [place], Drue Stager, MD   9 months ago Dyslipidemia associated with type 2 diabetes mellitus [facility])   [facility] Steele Sizer, MD   1 year ago Dyslipidemia associated with type 2 diabetes mellitus [facility])   [facility] Steele Sizer, MD   1 year ago Viral URI   [place], DO       Future Appointments             In 1 month Steele Sizer, MD [facility]             fluconazole (DIFLUCAN) 150 MG tablet 24 tablet     Sig: TAKE 1 TABLET BY MOUTH TWICE WEEKLY AND AS NEEDED     Off-Protocol Failed - 09/03/2022 12:34 PM      Failed - Medication not assigned to a protocol, review manually.      Passed - Valid encounter within last 12 months    Recent Outpatient Visits           3 weeks ago Welcome to [employer] preventive visit   [facility], Drue Stager, MD   3 months ago Dyslipidemia associated with type 2 diabetes mellitus [facility])   [facility] Steele Sizer, MD   9 months ago Dyslipidemia associated with type 2 diabetes mellitus [facility])   [facility] Sizer, MD   1 year ago Dyslipidemia associated with type 2 diabetes mellitus [facility])   [facility] Steele Sizer, MD   1 year ago Viral URI   [facility] Myles Gip, DO       Future Appointments             In 1 month Steele Sizer, MD [facility]             montelukast (SINGULAIR) 10 MG tablet 90 tablet 1    Sig: TAKE 1 TABLET(10 MG) BY MOUTH AT  BEDTIME     Pulmonology:  Leukotriene Inhibitors Passed - 09/03/2022 12:34 PM      Passed - Valid encounter within last 12 months    Recent Outpatient Visits           3 weeks ago Welcome to [employer] preventive visit   [facility] Steele Sizer, MD   3 months ago Dyslipidemia associated with type 2 diabetes mellitus [facility])   [facility] Sizer, MD   9 months ago Dyslipidemia associated with type 2 diabetes mellitus [facility])   [facility] Sizer, MD   1 year ago Dyslipidemia associated with type 2 diabetes mellitus [facility])   [facility] Steele Sizer, MD   1 year ago Viral URI   [facility] Rory Percy  M, DO       Future Appointments             In 1 month [place], Drue Stager, MD [facility]             pregabalin (LYRICA) 75 MG capsule 90 capsule 0     Not Delegated - Neurology:  Anticonvulsants - Controlled - pregabalin Failed - 09/03/2022 12:34 PM      Failed - This refill cannot be delegated      Passed - Cr in normal range and within 360 days    Creatinine  Date Value Ref Range Status  02/01/2022 0.8 0.5 - 1.1 Final   Creatinine, Ser  Date Value Ref Range Status  12/10/2020 0.56 0.44 - 1.00 mg/dL Final   Creatinine, Urine  Date Value Ref Range Status  06/05/2018 63 20 - 275 mg/dL Final         Passed - Completed PHQ-2 or PHQ-9 in the last 360 days      Passed - Valid encounter within last 12 months    Recent Outpatient Visits           3 weeks ago Welcome to [employer] preventive visit   [facility] Steele Sizer, MD   3 months ago Dyslipidemia associated with type 2 diabetes mellitus [facility])   [facility] Sizer, MD   9 months ago Dyslipidemia associated with type 2 diabetes mellitus [facility])   [facility] Sizer, MD   1 year ago Dyslipidemia associated with type 2 diabetes mellitus [facility])   [facility] Sizer, MD   1 year ago Viral URI   [place], DO       Future Appointments             In 1 month Steele Sizer, MD [facility], [facility]            Signed Prescriptions Disp Refills   Dapagliflozin Pro-metFORMIN ER (XIGDUO XR) 05-999 MG TB24 90 tablet 0    Sig: Take 1 tablet by mouth daily.     Endocrinology:  Diabetes - Biguanide + SGLT2 Inhibitor Combos Failed - 09/03/2022 12:34 PM      Failed - HBA1C is between 0 and 7.9 and within 180 days    Hemoglobin A1C  Date Value Ref Range Status  06/04/2022 8.3 (A) 4.0 - 5.6 % Final   HbA1c, POC (prediabetic range)  Date Value Ref Range Status  07/27/2018 7.4 (A) 5.7 - 6.4 % Final   HbA1c, POC (controlled diabetic range)  Date Value Ref Range Status  02/28/2019 7.3 (A) 0.0 - 7.0 % Final   Hgb A1c MFr Bld  Date Value Ref Range Status  12/07/2021 7.2 (H) 4.8 - 5.6 % Final    Comment:             Prediabetes: 5.7 - 6.4          Diabetes: >6.4          Glycemic control for adults with diabetes: <7.0          Failed - B12 Level in normal range and within 720 days    No results found for: "VITAMINB12"       Passed - Cr in normal range and within 360 days    Creatinine  Date Value Ref Range Status  02/01/2022 0.8  0.5 - 1.1 Final   Creatinine, Ser  Date Value Ref Range Status  12/10/2020 0.56 0.44 - 1.00 mg/dL Final   Creatinine, Urine  Date Value Ref Range Status  06/05/2018 63 20 - 275 mg/dL Final         Passed - eGFR in normal range and within 360 days    GFR calc Af Amer  Date Value Ref Range Status  03/27/2020 92 >59 mL/min/1.73 Final    Comment:    **[facility] currently reports eGFR in compliance with the current**   recommendations of the [employer].  [facility] will   update reporting as new guidelines are published from the NKF-ASN   Task force.    GFR, Estimated  Date Value Ref Range Status  12/10/2020 >60 >60 mL/min Final    Comment:    (NOTE) Calculated using the CKD-EPI Creatinine Equation (2021)    eGFR  Date Value Ref Range Status  02/01/2022 86  Final         Passed - Valid encounter within last 6 months    Recent Outpatient Visits           3 weeks ago Welcome to [employer] preventive visit   [facility] Steele [facility]izer, MD   3 months ago Dyslipidemia associated with type 2 diabetes mellitus [facility])   [facility]izer, MD   9 months ago Dyslipidemia associated with type 2 diabetes mellitus [facility])   [facility], MD   1 year ago Dyslipidemia associated with type 2 diabetes mellitus [facility])   [facility] Sizer, MD   1 year ago Viral URI   [facility] Myles Gip, DO       Future Appointments             In 1 month Steele Sizer, MD [facility]            Passed - CBC within normal limits and completed in the last 12 months    WBC  Date Value Ref Range Status  12/07/2021 5.0 3.4 - 10.8 x10E3/uL Final  12/10/2020 5.1 4.0 - 10.5 K/uL Final   RBC  Date Value Ref Range Status  12/07/2021 4.38 3.77 - 5.28 x10E6/uL Final  12/10/2020 4.17 3.87 - 5.11 MIL/uL Final   Hemoglobin  Date Value Ref Range Status  12/07/2021 13.7 11.1 - 15.9 g/dL Final   Hematocrit  Date Value Ref Range Status  12/07/2021 39.8 34.0 - 46.6 % Final   MCHC  Date Value Ref Range Status  12/07/2021 34.4 31.5 - 35.7 g/dL Final  12/10/2020 33.7 30.0 - 36.0 g/dL Final   [facility]  Date Value Ref Range Status  12/07/2021 31.3 26.6 - 33.0 pg Final  12/10/2020 30.7 26.0 - 34.0 pg Final   MCV  Date Value Ref Range Status  12/07/2021 91  79 - 97 fL Final   No results found for: "PLTCOUNTKUC", "LABPLAT", "POCPLA" RDW  Date Value Ref Range Status  12/07/2021 13.2 11.7 - 15.4 % Final          fluticasone (FLONASE) 50 MCG/ACT nasal spray 48 g 0    Sig: SHAKE LIQUID AND USE 2 SPRAYS IN EACH NOSTRIL DAILY     Ear, Nose, and Throat: Nasal Preparations - Corticosteroids Passed - 09/03/2022 12:34 PM      Passed - Valid encounter within last 12 months  Recent Outpatient Visits           3 weeks ago Welcome to [employer] preventive visit   [facility] [place], Drue Stager, MD   3 months ago Dyslipidemia associated with type 2 diabetes mellitus [facility])   [facility] Steele Sizer, MD   9 months ago Dyslipidemia associated with type 2 diabetes mellitus [facility])   [facility]izer, MD   1 year ago Dyslipidemia associated with type 2 diabetes mellitus [facility])   [facility] [place], Drue Stager, MD   1 year ago Viral URI   [facility] Myles Gip, DO       Future Appointments             In 1 month Steele Sizer, MD [facility]             levocetirizine (XYZAL) 5 MG tablet 90 tablet 1    Sig: TAKE 1 TABLET(5 MG) BY MOUTH EVERY EVENING     Ear, Nose, and Throat:  Antihistamines - levocetirizine dihydrochloride Passed - 09/03/2022 12:34 PM      Passed - Cr in normal range and within 360 days    Creatinine  Date Value Ref Range Status  02/01/2022 0.8 0.5 - 1.1 Final   Creatinine, Ser  Date Value Ref Range Status  12/10/2020 0.56 0.44 - 1.00 mg/dL Final   Creatinine, Urine  Date Value Ref Range Status  06/05/2018 63 20 - 275 mg/dL Final         Passed - eGFR is 10 or above and within 360 days    GFR calc Af Amer  Date Value Ref Range Status  03/27/2020 92 >59 mL/min/1.73 Final    Comment:    **[facility] currently reports eGFR in compliance  with the current**   recommendations of the [employer]. [facility] will   update reporting as new guidelines are published from the NKF-ASN   Task force.    GFR, Estimated  Date Value Ref Range Status  12/10/2020 >60 >60 mL/min Final    Comment:    (NOTE) Calculated using the CKD-EPI Creatinine Equation (2021)    eGFR  Date Value Ref Range Status  02/01/2022 86  Final         Passed - Valid encounter within last 12 months    Recent Outpatient Visits           3 weeks ago Welcome to [employer] preventive visit   [facility] [place], Drue Stager, MD   3 months ago Dyslipidemia associated with type 2 diabetes mellitus [facility])   [facility], Drue Stager, MD   9 months ago Dyslipidemia associated with type 2 diabetes mellitus [facility])   [facility] Steele Sizer, MD   1 year ago Dyslipidemia associated with type 2 diabetes mellitus [facility])   [facility] Steele Sizer, MD   1 year ago Viral URI   [place], DO       Future Appointments             In 1 month Steele Sizer, MD [facility]             omega-3 acid ethyl esters (LOVAZA) 1 g capsule 360 capsule 0    Sig: Take 2 capsules (2 g total) by mouth  2 (two) times daily.     Endocrinology:  Nutritional Agents - omega-3 acid ethyl esters Failed - 09/03/2022 12:34 PM      Failed - Lipid Panel in normal range within the last 12 months    Cholesterol, Total  Date Value Ref Range Status  12/07/2021 120 100 - 199 mg/dL Final   LDL Chol Calc (NIH)  Date Value Ref Range Status  12/07/2021 40 0 - 99 mg/dL Final   HDL  Date Value Ref Range Status  12/07/2021 59 >39 mg/dL Final   Triglycerides  Date Value Ref Range Status  12/07/2021 117 0 - 149 mg/dL Final         Passed - Valid encounter within last 12 months     Recent Outpatient Visits           3 weeks ago Welcome to [employer] preventive visit   [facility] Steele Sizer, MD   3 months ago Dyslipidemia associated with type 2 diabetes mellitus [facility])   [facility] Sizer, MD   9 months ago Dyslipidemia associated with type 2 diabetes mellitus [facility])   [facility] Sizer, MD   1 year ago Dyslipidemia associated with type 2 diabetes mellitus [facility])   [facility] Steele Sizer, MD   1 year ago Viral URI   [place], DO       Future Appointments             In 1 month Ancil Boozer, Drue Stager, MD [facility]             Semaglutide, 1 MG/DOSE, (OZEMPIC, 1 MG/DOSE,) 4 MG/3ML SOPN 9 mL 1    Sig: INJECT 1 MG UNDER THE SKIN ONCE A WEEK     Endocrinology:  Diabetes - GLP-1 Receptor Agonists - semaglutide Failed - 09/03/2022 12:34 PM      Failed - HBA1C in normal range and within 180 days    Hemoglobin A1C  Date Value Ref Range Status  06/04/2022 8.3 (A) 4.0 - 5.6 % Final   HbA1c, POC (prediabetic range)  Date Value Ref Range Status  07/27/2018 7.4 (A) 5.7 - 6.4 % Final   HbA1c, POC (controlled diabetic range)  Date Value Ref Range Status  02/28/2019 7.3 (A) 0.0 - 7.0 % Final   Hgb A1c MFr Bld  Date Value Ref Range Status  12/07/2021 7.2 (H) 4.8 - 5.6 % Final    Comment:             Prediabetes: 5.7 - 6.4          Diabetes: >6.4          Glycemic control for adults with diabetes: <7.0          Passed - Cr in normal range and within 360 days    Creatinine  Date Value Ref Range Status  02/01/2022 0.8 0.5 - 1.1 Final   Creatinine, Ser  Date Value Ref Range Status  12/10/2020 0.56 0.44 - 1.00 mg/dL Final   Creatinine, Urine  Date Value Ref Range Status  06/05/2018 63 20 - 275 mg/dL Final         Passed - Valid encounter within  last 6 months    Recent Outpatient Visits           3 weeks ago Welcome to [employer] preventive visit   [facility] Steele [facility]izer, MD   3 months ago Dyslipidemia associated with type 2 diabetes mellitus [facility])   [facility] [place], Drue Stager, MD   9 months ago Dyslipidemia associated with type 2 diabetes mellitus [facility])   [facility] Steele Sizer, MD   1 year ago Dyslipidemia associated with type 2 diabetes mellitus [facility])   [facility] Steele Sizer, MD   1 year ago Viral URI   [facility] Myles Gip, DO       Future Appointments             In 1 month Steele Sizer, MD [facility]             polyethylene glycol powder [facility]) 17 GM/SCOOP powder 850 g 0    Sig: Take 17 g by mouth daily as needed for mild constipation or moderate constipation.     Gastroenterology:  Laxatives Passed - 09/03/2022 12:34 PM      Passed - Valid encounter within last 12 months    Recent Outpatient Visits           3 weeks ago Welcome to [employer] preventive visit   [facility] Steele Sizer, MD   3 months ago Dyslipidemia associated with type 2 diabetes mellitus [facility])   [place] Steele Sizer, MD   9 months ago Dyslipidemia associated with type 2 diabetes mellitus [facility])   [facility]izer, MD   1 year ago Dyslipidemia associated with type 2 diabetes mellitus [facility])   [facility] Steele Sizer, MD   1 year ago Viral URI   [facility] Myles Gip, DO       Future Appointments             In 1 month Steele Sizer, MD [facility]             rosuvastatin (CRESTOR) 40 MG tablet 90 tablet 1    Sig: TAKE 1  TABLET(40 MG) BY MOUTH DAILY     Cardiovascular:  Antilipid - Statins 2 Failed - 09/03/2022 12:34 PM      Failed - Lipid Panel in normal range within the last 12 months    Cholesterol, Total  Date Value Ref Range Status  12/07/2021 120 100 - 199 mg/dL Final   LDL Chol Calc (NIH)  Date Value Ref Range Status  12/07/2021 40 0 - 99 mg/dL Final   HDL  Date Value Ref Range Status  12/07/2021 59 >39 mg/dL Final   Triglycerides  Date Value Ref Range Status  12/07/2021 117 0 - 149 mg/dL Final         Passed - Cr in normal range and within 360 days    Creatinine  Date Value Ref Range Status  02/01/2022 0.8 0.5 - 1.1 Final   Creatinine, Ser  Date Value Ref Range Status  12/10/2020 0.56 0.44 - 1.00 mg/dL Final   Creatinine, Urine  Date Value Ref Range Status  06/05/2018 63 20 - 275 mg/dL Final         Passed - Patient is not pregnant      Passed - Valid encounter within last 12 months    Recent Outpatient Visits           3 weeks ago Welcome to [employer] preventive visit  [facility] [place], Drue Stager, MD   3 months ago Dyslipidemia associated with type 2 diabetes mellitus [facility])   [facility] Steele Sizer, MD   9 months ago Dyslipidemia associated with type 2 diabetes mellitus [facility])   [facility] Steele Sizer, MD   1 year ago Dyslipidemia associated with type 2 diabetes mellitus [facility])   [facility] Steele Sizer, MD   1 year ago Viral URI   [facility] Myles Gip, DO       Future Appointments             In 1 month Ancil Boozer, Drue Stager, MD [facility], [facility]

## 2022-09-03 NOTE — Telephone Encounter (Signed)
Requested Prescriptions  Pending Prescriptions Disp Refills   allopurinol (ZYLOPRIM) 300 MG tablet 180 tablet     Sig: TAKE 1 TABLET(300 MG) BY MOUTH TWICE DAILY     Endocrinology:  Gout Agents - allopurinol Failed - 09/03/2022 12:34 PM      Failed - Uric Acid in normal range and within 360 days    Uric Acid  Date Value Ref Range Status  12/08/2017 3.4 2.5 - 7.1 mg/dL Final    Comment:               Therapeutic target for gout patients: <6.0         Passed - Cr in normal range and within 360 days    Creatinine  Date Value Ref Range Status  02/01/2022 0.8 0.5 - 1.1 Final   Creatinine, Ser  Date Value Ref Range Status  12/10/2020 0.56 0.44 - 1.00 mg/dL Final   Creatinine, Urine  Date Value Ref Range Status  06/05/2018 63 20 - 275 mg/dL Final         Passed - Valid encounter within last 12 months    Recent Outpatient Visits           3 weeks ago Welcome to [employer] preventive visit   [facility] Steele Sizer, MD   3 months ago Dyslipidemia associated with type 2 diabetes mellitus [facility])   [facility]izer, MD   9 months ago Dyslipidemia associated with type 2 diabetes mellitus [facility])   [facility] Steele Sizer, MD   1 year ago Dyslipidemia associated with type 2 diabetes mellitus [facility])   [facility] Steele Sizer, MD   1 year ago Viral URI   [facility] Myles Gip, DO       Future Appointments             In 1 month Steele Sizer, MD [facility] [place] within normal limits and completed in the last 12 months    WBC  Date Value Ref Range Status  12/07/2021 5.0 3.4 - 10.8 x10E3/uL Final  12/10/2020 5.1 4.0 - 10.5 K/uL Final   RBC  Date Value Ref Range Status  12/07/2021 4.38 3.77 - 5.28 x10E6/uL Final  12/10/2020 4.17 3.87 - 5.11 MIL/uL Final    Hemoglobin  Date Value Ref Range Status  12/07/2021 13.7 11.1 - 15.9 g/dL Final   Hematocrit  Date Value Ref Range Status  12/07/2021 39.8 34.0 - 46.6 % Final   MCHC  Date Value Ref Range Status  12/07/2021 34.4 31.5 - 35.7 g/dL Final  12/10/2020 33.7 30.0 - 36.0 g/dL Final   [facility]  Date Value Ref Range Status  12/07/2021 31.3 26.6 - 33.0 pg Final  12/10/2020 30.7 26.0 - 34.0 pg Final   MCV  Date Value Ref Range Status  12/07/2021 91 79 - 97 fL Final   No results found for: "PLTCOUNTKUC", "LABPLAT", "POCPLA" RDW  Date Value Ref Range Status  12/07/2021 13.2 11.7 - 15.4 % Final          amLODipine-olmesartan (AZOR) 5-40 MG tablet 90 tablet     Sig: Take 1 tablet by mouth daily.     Cardiovascular: CCB + ARB Combos Failed - 09/03/2022 12:34 PM      Failed - K in normal range and within 180 days  Potassium  Date Value Ref Range Status  02/01/2022 4.3 3.5 - 5.1 mEq/L Final         Failed - Cr in normal range and within 180 days    Creatinine  Date Value Ref Range Status  02/01/2022 0.8 0.5 - 1.1 Final   Creatinine, Ser  Date Value Ref Range Status  12/10/2020 0.56 0.44 - 1.00 mg/dL Final   Creatinine, Urine  Date Value Ref Range Status  06/05/2018 63 20 - 275 mg/dL Final         Failed - Na in normal range and within 180 days    Sodium  Date Value Ref Range Status  02/01/2022 143 137 - 147 Final         Passed - Patient is not pregnant      Passed - Last BP in normal range    BP Readings from Last 1 Encounters:  08/24/22 121/75         Passed - Valid encounter within last 6 months    Recent Outpatient Visits           3 weeks ago Welcome to [employer] preventive visit   [facility] Steele [facility]izer, MD   3 months ago Dyslipidemia associated with type 2 diabetes mellitus [facility])   [facility] Steele [facility]izer, MD   9 months ago Dyslipidemia associated with type 2 diabetes mellitus [facility])   [facility]izer, MD   1 year ago Dyslipidemia associated with type 2 diabetes mellitus [facility])   [facility] Steele Sizer, MD   1 year ago Viral URI   [place], DO       Future Appointments             In 1 month Steele Sizer, MD [facility]             baclofen (LIORESAL) 10 MG tablet 90 tablet     Sig: TAKE 1 TABLET(10 MG) BY MOUTH THREE TIMES DAILY AS NEEDED FOR MUSCLE SPASMS     Analgesics:  Muscle Relaxants - baclofen Failed - 09/03/2022 12:34 PM      Failed - Cr in normal range and within 180 days    Creatinine  Date Value Ref Range Status  02/01/2022 0.8 0.5 - 1.1 Final   Creatinine, Ser  Date Value Ref Range Status  12/10/2020 0.56 0.44 - 1.00 mg/dL Final   Creatinine, Urine  Date Value Ref Range Status  06/05/2018 63 20 - 275 mg/dL Final         Failed - eGFR is 30 or above and within 180 days    GFR calc Af Amer  Date Value Ref Range Status  03/27/2020 92 >59 mL/min/1.73 Final    Comment:    **[facility] currently reports eGFR in compliance with the current**   recommendations of the [employer]. [facility] will   update reporting as new guidelines are published from the NKF-ASN   Task force.    GFR, Estimated  Date Value Ref Range Status  12/10/2020 >60 >60 mL/min Final    Comment:    (NOTE) Calculated using the CKD-EPI Creatinine Equation (2021)    eGFR  Date Value Ref Range Status  02/01/2022 86  Final         Passed - Valid encounter within last 6 months    Recent Outpatient Visits  3 weeks ago Welcome to [employer] preventive visit   [facility] [place], Drue Stager, MD   3 months ago Dyslipidemia associated with type 2 diabetes mellitus [facility])   [facility] Steele Sizer, MD   9 months ago Dyslipidemia associated with type 2  diabetes mellitus [facility])   [facility]izer, MD   1 year ago Dyslipidemia associated with type 2 diabetes mellitus [facility])   [facility] Steele Sizer, MD   1 year ago Viral URI   [place], DO       Future Appointments             In 1 month Steele Sizer, MD [facility]             Dapagliflozin Pro-metFORMIN ER (XIGDUO XR) 05-999 MG TB24 90 tablet 0    Sig: Take 1 tablet by mouth daily.     Endocrinology:  Diabetes - Biguanide + SGLT2 Inhibitor Combos Failed - 09/03/2022 12:34 PM      Failed - HBA1C is between 0 and 7.9 and within 180 days    Hemoglobin A1C  Date Value Ref Range Status  06/04/2022 8.3 (A) 4.0 - 5.6 % Final   HbA1c, POC (prediabetic range)  Date Value Ref Range Status  07/27/2018 7.4 (A) 5.7 - 6.4 % Final   HbA1c, POC (controlled diabetic range)  Date Value Ref Range Status  02/28/2019 7.3 (A) 0.0 - 7.0 % Final   Hgb A1c MFr Bld  Date Value Ref Range Status  12/07/2021 7.2 (H) 4.8 - 5.6 % Final    Comment:             Prediabetes: 5.7 - 6.4          Diabetes: >6.4          Glycemic control for adults with diabetes: <7.0          Failed - B12 Level in normal range and within 720 days    No results found for: "VITAMINB12"       Passed - Cr in normal range and within 360 days    Creatinine  Date Value Ref Range Status  02/01/2022 0.8 0.5 - 1.1 Final   Creatinine, Ser  Date Value Ref Range Status  12/10/2020 0.56 0.44 - 1.00 mg/dL Final   Creatinine, Urine  Date Value Ref Range Status  06/05/2018 63 20 - 275 mg/dL Final         Passed - eGFR in normal range and within 360 days    GFR calc Af Amer  Date Value Ref Range Status  03/27/2020 92 >59 mL/min/1.73 Final    Comment:    **[facility] currently reports eGFR in compliance with the current**   recommendations of the [employer]. [facility] will   update reporting as new guidelines are published from the NKF-ASN   Task force.    GFR, Estimated  Date Value Ref Range Status  12/10/2020 >60 >60 mL/min Final    Comment:    (NOTE) Calculated using the CKD-EPI Creatinine Equation (2021)    eGFR  Date Value Ref Range Status  02/01/2022 86  Final         Passed - Valid encounter within last 6 months    Recent Outpatient Visits           3 weeks ago Welcome to [employer] preventive  visit   [facility] Steele [facility], MD   3 months ago Dyslipidemia associated with type 2 diabetes mellitus [facility])   [facility]izer, MD   9 months ago Dyslipidemia associated with type 2 diabetes mellitus [facility])   [facility], MD   1 year ago Dyslipidemia associated with type 2 diabetes mellitus [facility])   [facility]izer, MD   1 year ago Viral URI   [facility] Myles Gip, DO       Future Appointments             In 1 month Steele Sizer, MD [facility]            Passed - CBC within normal limits and completed in the last 12 months    WBC  Date Value Ref Range Status  12/07/2021 5.0 3.4 - 10.8 x10E3/uL Final  12/10/2020 5.1 4.0 - 10.5 K/uL Final   RBC  Date Value Ref Range Status  12/07/2021 4.38 3.77 - 5.28 x10E6/uL Final  12/10/2020 4.17 3.87 - 5.11 MIL/uL Final   Hemoglobin  Date Value Ref Range Status  12/07/2021 13.7 11.1 - 15.9 g/dL Final   Hematocrit  Date Value Ref Range Status  12/07/2021 39.8 34.0 - 46.6 % Final   MCHC  Date Value Ref Range Status  12/07/2021 34.4 31.5 - 35.7 g/dL Final  12/10/2020 33.7 30.0 - 36.0 g/dL Final   [facility]  Date Value Ref Range Status  12/07/2021 31.3 26.6 - 33.0 pg Final  12/10/2020 30.7 26.0 - 34.0 pg Final   MCV  Date Value Ref Range Status   12/07/2021 91 79 - 97 fL Final   No results found for: "PLTCOUNTKUC", "LABPLAT", "POCPLA" RDW  Date Value Ref Range Status  12/07/2021 13.2 11.7 - 15.4 % Final          fluconazole (DIFLUCAN) 150 MG tablet 24 tablet     Sig: TAKE 1 TABLET BY MOUTH TWICE WEEKLY AND AS NEEDED     Off-Protocol Failed - 09/03/2022 12:34 PM      Failed - Medication not assigned to a protocol, review manually.      Passed - Valid encounter within last 12 months    Recent Outpatient Visits           3 weeks ago Welcome to [employer] preventive visit   [facility], Drue Stager, MD   3 months ago Dyslipidemia associated with type 2 diabetes mellitus [facility])   [facility] Steele Sizer, MD   9 months ago Dyslipidemia associated with type 2 diabetes mellitus [facility])   [facility]izer, MD   1 year ago Dyslipidemia associated with type 2 diabetes mellitus [facility])   [facility] Steele Sizer, MD   1 year ago Viral URI   [place], DO       Future Appointments             In 1 month [facility]             fluticasone [facility]) 50 MCG/ACT nasal spray 48 g 0    Sig: SHAKE LIQUID AND USE 2 SPRAYS IN EACH NOSTRIL DAILY     Ear, Nose, and Throat: Nasal Preparations - Corticosteroids  Passed - 09/03/2022 12:34 PM      Passed - Valid encounter within last 12 months    Recent Outpatient Visits           3 weeks ago Welcome to [employer] preventive visit   [facility] [place], Drue Stager, MD   3 months ago Dyslipidemia associated with type 2 diabetes mellitus [facility])   [facility] Steele Sizer, MD   9 months ago Dyslipidemia associated with type 2 diabetes mellitus [facility])   [facility], MD   1  year ago Dyslipidemia associated with type 2 diabetes mellitus [facility])   [facility] [place], Drue Stager, MD   1 year ago Viral URI   [place], DO       Future Appointments             In 1 month Steele Sizer, MD [facility]             levocetirizine (XYZAL) 5 MG tablet 90 tablet 1     Ear, Nose, and Throat:  Antihistamines - levocetirizine dihydrochloride Passed - 09/03/2022 12:34 PM      Passed - Cr in normal range and within 360 days    Creatinine  Date Value Ref Range Status  02/01/2022 0.8 0.5 - 1.1 Final   Creatinine, Ser  Date Value Ref Range Status  12/10/2020 0.56 0.44 - 1.00 mg/dL Final   Creatinine, Urine  Date Value Ref Range Status  06/05/2018 63 20 - 275 mg/dL Final         Passed - eGFR is 10 or above and within 360 days    GFR calc Af Amer  Date Value Ref Range Status  03/27/2020 92 >59 mL/min/1.73 Final    Comment:    **[facility] currently reports eGFR in compliance with the current**   recommendations of the [employer]. [facility] will   update reporting as new guidelines are published from the NKF-ASN   Task force.    GFR, Estimated  Date Value Ref Range Status  12/10/2020 >60 >60 mL/min Final    Comment:    (NOTE) Calculated using the CKD-EPI Creatinine Equation (2021)    eGFR  Date Value Ref Range Status  02/01/2022 86  Final         Passed - Valid encounter within last 12 months    Recent Outpatient Visits           3 weeks ago Welcome to [employer] preventive visit   [facility] [place], Drue Stager, MD   3 months ago Dyslipidemia associated with type 2 diabetes mellitus [facility])   [facility], MD   9 months ago Dyslipidemia associated with type 2 diabetes mellitus [facility])   [facility], MD   1 year ago  Dyslipidemia associated with type 2 diabetes mellitus [facility])   [facility] Steele Sizer, MD   1 year ago Viral URI   [place], DO       Future Appointments             In 1 month Steele Sizer, MD [facility]             montelukast (SINGULAIR) 10 MG tablet 90 tablet 1     Pulmonology:  Leukotriene Inhibitors Passed - 09/03/2022 12:34 PM      Passed - Valid encounter within last 12 months    Recent Outpatient Visits           3 weeks ago Welcome to [employer] preventive visit   [facility] [place], Drue Stager, MD   3 months ago Dyslipidemia associated with type 2 diabetes mellitus [facility])   [facility] [place], Drue Stager, MD   9 months ago Dyslipidemia associated with type 2 diabetes mellitus [facility])   [facility] Steele Sizer, MD   1 year ago Dyslipidemia associated with type 2 diabetes mellitus [facility])   [facility] Steele Sizer, MD   1 year ago Viral URI   [place], DO       Future Appointments             In 1 month Steele Sizer, MD [facility]             omega-3 acid ethyl esters (LOVAZA) 1 g capsule 360 capsule 0    Sig: Take 2 capsules (2 g total) by mouth 2 (two) times daily.     Endocrinology:  Nutritional Agents - omega-3 acid ethyl esters Failed - 09/03/2022 12:34 PM      Failed - Lipid Panel in normal range within the last 12 months    Cholesterol, Total  Date Value Ref Range Status  12/07/2021 120 100 - 199 mg/dL Final   LDL Chol Calc (NIH)  Date Value Ref Range Status  12/07/2021 40 0 - 99 mg/dL Final   HDL  Date Value Ref Range Status  12/07/2021 59 >39 mg/dL Final   Triglycerides  Date Value Ref Range Status  12/07/2021 117 0 - 149 mg/dL Final          Passed - Valid encounter within last 12 months    Recent Outpatient Visits           3 weeks ago Welcome to [employer] preventive visit   [facility] Steele Sizer, MD   3 months ago Dyslipidemia associated with type 2 diabetes mellitus [facility])   [facility] [place], Drue Stager, MD   9 months ago Dyslipidemia associated with type 2 diabetes mellitus [facility])   [facility] [place], Drue Stager, MD   1 year ago Dyslipidemia associated with type 2 diabetes mellitus [facility])   [facility] Steele Sizer, MD   1 year ago Viral URI   [facility] Myles Gip, DO       Future Appointments             In 1 month Steele Sizer, MD [facility]             Semaglutide, 1 MG/DOSE, (OZEMPIC, 1 MG/DOSE,) 4 MG/3ML SOPN 9 mL 1    Sig: INJECT 1 MG UNDER THE SKIN ONCE A WEEK     Endocrinology:  Diabetes - GLP-1 Receptor Agonists - semaglutide Failed - 09/03/2022 12:34 PM      Failed - HBA1C in normal range and within 180 days    Hemoglobin A1C  Date Value Ref Range Status  06/04/2022 8.3 (A) 4.0 - 5.6 % Final   HbA1c, POC (prediabetic range)  Date Value Ref Range Status  07/27/2018 7.4 (A) 5.7 - 6.4 % Final  HbA1c, POC (controlled diabetic range)  Date Value Ref Range Status  02/28/2019 7.3 (A) 0.0 - 7.0 % Final   Hgb A1c MFr Bld  Date Value Ref Range Status  12/07/2021 7.2 (H) 4.8 - 5.6 % Final    Comment:             Prediabetes: 5.7 - 6.4          Diabetes: >6.4          Glycemic control for adults with diabetes: <7.0          Passed - Cr in normal range and within 360 days    Creatinine  Date Value Ref Range Status  02/01/2022 0.8 0.5 - 1.1 Final   Creatinine, Ser  Date Value Ref Range Status  12/10/2020 0.56 0.44 - 1.00 mg/dL Final   Creatinine, Urine  Date Value Ref Range Status  06/05/2018 63 20  - 275 mg/dL Final         Passed - Valid encounter within last 6 months    Recent Outpatient Visits           3 weeks ago Welcome to [employer] preventive visit   [facility] Steele Sizer, MD   3 months ago Dyslipidemia associated with type 2 diabetes mellitus [facility])   [facility] [place], Drue Stager, MD   9 months ago Dyslipidemia associated with type 2 diabetes [facility])   [facility] Steele [facility]izer, MD   1 year ago Dyslipidemia associated with type 2 diabetes mellitus [facility])   [facility] Steele Sizer, MD   1 year ago Viral URI   [facility] Myles Gip, DO       Future Appointments             In 1 month Steele Sizer, MD [facility]             polyethylene glycol powder (GLYCOLAX/MIRALAX) 17 GM/SCOOP powder 850 g 0    Sig: Take 17 g by mouth daily as needed for mild constipation or moderate constipation.     Gastroenterology:  Laxatives Passed - 09/03/2022 12:34 PM      Passed - Valid encounter within last 12 months    Recent Outpatient Visits           3 weeks ago Welcome to [employer] preventive visit   [facility] [place], Drue Stager, MD   3 months ago Dyslipidemia associated with type 2 diabetes mellitus [facility])   [facility] Steele [facility], MD   9 months ago Dyslipidemia associated with type 2 diabetes mellitus [facility])   [facility] S[facility], MD   1 year ago Dyslipidemia associated with type 2 diabetes mellitus [facility])   [facility] Steele Sizer, MD   1 year ago Viral URI   [place], DO       Future Appointments             In 1 month Steele Sizer, MD [facility]              pregabalin (LYRICA) 75 MG capsule 90 capsule 0     Not Delegated - Neurology:  Anticonvulsants - Controlled - pregabalin Failed - 09/03/2022 12:34 PM      Failed - This refill cannot be delegated  Passed - Cr in normal range and within 360 days    Creatinine  Date Value Ref Range Status  02/01/2022 0.8 0.5 - 1.1 Final   Creatinine, Ser  Date Value Ref Range Status  12/10/2020 0.56 0.44 - 1.00 mg/dL Final   Creatinine, Urine  Date Value Ref Range Status  06/05/2018 63 20 - 275 mg/dL Final         Passed - Completed PHQ-2 or PHQ-9 in the last 360 days      Passed - Valid encounter within last 12 months    Recent Outpatient Visits           3 weeks ago Welcome to [employer] preventive visit   [facility]izer, MD   3 months ago Dyslipidemia associated with type 2 diabetes mellitus [facility])   [facility] Steele Sizer, MD   9 months ago Dyslipidemia associated with type 2 diabetes mellitus [facility])   [facility] Steele Sizer, MD   1 year ago Dyslipidemia associated with type 2 diabetes mellitus [facility])   [facility] Steele Sizer, MD   1 year ago Viral URI   [place], DO       Future Appointments             In 1 month [place], Drue Stager, MD [facility], [facility]             rosuvastatin (CRESTOR) 40 MG tablet 90 tablet 1     Cardiovascular:  Antilipid - Statins 2 Failed - 09/03/2022 12:34 PM      Failed - Lipid Panel in normal range within the last 12 months    Cholesterol, Total  Date Value Ref Range Status  12/07/2021 120 100 - 199 mg/dL Final   LDL Chol Calc (NIH)  Date Value Ref Range Status  12/07/2021 40 0 - 99 mg/dL Final   HDL  Date Value Ref Range Status  12/07/2021 59 >39 mg/dL Final   Triglycerides  Date Value Ref Range Status  12/07/2021 117 0 - 149 mg/dL  Final         Passed - Cr in normal range and within 360 days    Creatinine  Date Value Ref Range Status  02/01/2022 0.8 0.5 - 1.1 Final   Creatinine, Ser  Date Value Ref Range Status  12/10/2020 0.56 0.44 - 1.00 mg/dL Final   Creatinine, Urine  Date Value Ref Range Status  06/05/2018 63 20 - 275 mg/dL Final         Passed - Patient is not pregnant      Passed - Valid encounter within last 12 months    Recent Outpatient Visits           3 weeks ago Welcome to [employer] preventive visit   [facility] Steele Sizer, MD   3 months ago Dyslipidemia associated with type 2 diabetes mellitus [facility])   [facility] Steele Sizer, MD   9 months ago Dyslipidemia associated with type 2 diabetes mellitus [facility])   [facility] Sizer, MD   1 year ago Dyslipidemia associated with type 2 diabetes mellitus [facility])   [facility] Steele Sizer, MD   1 year ago Viral URI   [facility] Myles Gip, DO       Future Appointments  In 1 month [place], Drue Stager, MD [place]

## 2022-09-11 ENCOUNTER — Other Ambulatory Visit: Payer: Self-pay | Admitting: Nurse Practitioner

## 2022-09-11 ENCOUNTER — Other Ambulatory Visit: Payer: Self-pay | Admitting: Family Medicine

## 2022-09-11 DIAGNOSIS — E669 Obesity, unspecified: Secondary | ICD-10-CM

## 2022-09-11 DIAGNOSIS — Z9889 Other specified postprocedural states: Secondary | ICD-10-CM

## 2022-09-11 DIAGNOSIS — E1169 Type 2 diabetes mellitus with other specified complication: Secondary | ICD-10-CM

## 2022-09-13 NOTE — Telephone Encounter (Signed)
Requested by interface surescripts. [facility] pharmacy[facility].  Requested Prescriptions  Pending Prescriptions Disp Refills   OZEMPIC, 1 MG/DOSE, 4 MG/3ML SOPN [Pharmacy Med Name: OZEMPIC '1MG'$  PER DOSE ('4MG'$ /3ML) PFP] 9 mL 1    Sig: INJECT 1 MG UNDER THE SKIN ONCE A WEEK     Endocrinology:  Diabetes - GLP-1 Receptor Agonists - semaglutide Failed - 09/11/2022  1:43 PM      Failed - HBA1C in normal range and within 180 days    Hemoglobin A1C  Date Value Ref Range Status  06/04/2022 8.3 (A) 4.0 - 5.6 % Final   HbA1c, POC (prediabetic range)  Date Value Ref Range Status  07/27/2018 7.4 (A) 5.7 - 6.4 % Final   HbA1c, POC (controlled diabetic range)  Date Value Ref Range Status  02/28/2019 7.3 (A) 0.0 - 7.0 % Final   Hgb A1c MFr Bld  Date Value Ref Range Status  12/07/2021 7.2 (H) 4.8 - 5.6 % Final    Comment:             Prediabetes: 5.7 - 6.4          Diabetes: >6.4          Glycemic control for adults with diabetes: <7.0          Passed - Cr in normal range and within 360 days    Creatinine  Date Value Ref Range Status  02/01/2022 0.8 0.5 - 1.1 Final   Creatinine, Ser  Date Value Ref Range Status  12/10/2020 0.56 0.44 - 1.00 mg/dL Final   Creatinine, Urine  Date Value Ref Range Status  06/05/2018 63 20 - 275 mg/dL Final         Passed - Valid encounter within last 6 months    Recent Outpatient Visits           1 month ago Welcome to [employer] preventive visit   [facility] Steele Sizer, MD   3 months ago Dyslipidemia associated with type 2 diabetes mellitus [facility])   [facility] Steele Sizer, MD   9 months ago Dyslipidemia associated with type 2 diabetes mellitus [facility])   [facility] Steele Sizer, MD   1 year ago Dyslipidemia associated with type 2 diabetes mellitus [facility])   [facility] Steele Sizer, MD   1 year  ago Viral URI   [facility] Myles Gip, DO       Future Appointments             In 1 month Ancil Boozer, Drue Stager, MD [facility]

## 2022-09-13 NOTE — Telephone Encounter (Signed)
[facility] request sent 09/03/22 at 3:39 pm. Pharmacy staff reports patient has not picked up medication . Medication is on back order.

## 2022-09-14 DIAGNOSIS — M0609 Rheumatoid arthritis without rheumatoid factor, multiple sites: Secondary | ICD-10-CM | POA: Diagnosis not present

## 2022-09-14 DIAGNOSIS — Z79899 Other long term (current) drug therapy: Secondary | ICD-10-CM | POA: Diagnosis not present

## 2022-09-14 DIAGNOSIS — M159 Polyosteoarthritis, unspecified: Secondary | ICD-10-CM | POA: Diagnosis not present

## 2022-09-16 DIAGNOSIS — E113393 Type 2 diabetes mellitus with moderate nonproliferative diabetic retinopathy without macular edema, bilateral: Secondary | ICD-10-CM | POA: Diagnosis not present

## 2022-09-21 DIAGNOSIS — R202 Paresthesia of skin: Secondary | ICD-10-CM | POA: Diagnosis not present

## 2022-09-21 DIAGNOSIS — R200 Anesthesia of skin: Secondary | ICD-10-CM | POA: Diagnosis not present

## 2022-09-21 DIAGNOSIS — G5621 Lesion of ulnar nerve, right upper limb: Secondary | ICD-10-CM | POA: Diagnosis not present

## 2022-09-21 DIAGNOSIS — M25552 Pain in left hip: Secondary | ICD-10-CM | POA: Diagnosis not present

## 2022-10-02 DIAGNOSIS — M109 Gout, unspecified: Secondary | ICD-10-CM | POA: Diagnosis not present

## 2022-10-02 DIAGNOSIS — M1990 Unspecified osteoarthritis, unspecified site: Secondary | ICD-10-CM | POA: Diagnosis not present

## 2022-10-02 DIAGNOSIS — E1136 Type 2 diabetes mellitus with diabetic cataract: Secondary | ICD-10-CM | POA: Diagnosis not present

## 2022-10-02 DIAGNOSIS — D84821 Immunodeficiency due to drugs: Secondary | ICD-10-CM | POA: Diagnosis not present

## 2022-10-02 DIAGNOSIS — Z79891 Long term (current) use of opiate analgesic: Secondary | ICD-10-CM | POA: Diagnosis not present

## 2022-10-02 DIAGNOSIS — Z008 Encounter for other general examination: Secondary | ICD-10-CM | POA: Diagnosis not present

## 2022-10-02 DIAGNOSIS — Z8249 Family history of ischemic heart disease and other diseases of the circulatory system: Secondary | ICD-10-CM | POA: Diagnosis not present

## 2022-10-02 DIAGNOSIS — E785 Hyperlipidemia, unspecified: Secondary | ICD-10-CM | POA: Diagnosis not present

## 2022-10-02 DIAGNOSIS — J309 Allergic rhinitis, unspecified: Secondary | ICD-10-CM | POA: Diagnosis not present

## 2022-10-02 DIAGNOSIS — E669 Obesity, unspecified: Secondary | ICD-10-CM | POA: Diagnosis not present

## 2022-10-02 DIAGNOSIS — E1151 Type 2 diabetes mellitus with diabetic peripheral angiopathy without gangrene: Secondary | ICD-10-CM | POA: Diagnosis not present

## 2022-10-02 DIAGNOSIS — M62838 Other muscle spasm: Secondary | ICD-10-CM | POA: Diagnosis not present

## 2022-10-02 DIAGNOSIS — I10 Essential (primary) hypertension: Secondary | ICD-10-CM | POA: Diagnosis not present

## 2022-10-14 NOTE — Progress Notes (Signed)
Name: Maria Cruz   MRN: [id number]    DOB: July 22, 1956   Date:10/15/2022       Progress Note  Subjective  Chief Complaint  Follow Up  HPI  DM II with proteinuria: urine micro has been elevated since 2019, under the care of Dr. Abigail Butts since 2021. She is on  ARB/CCB, Xigduo, Ozempic '1mg'$    She has dyslipidemia and takes Rosuvastatin and Lovaza, she also has microalbuminuria and is on ARB. She denies polyphagia, polydipsia and polyuria. A1C was 6.5 % , went up to 7.2 % after that it was  8.3 % and today is down to 7.3 % , she states cost of Ozempic and Merleen Nicely is very high we will place a referral to our clinical pharmacist to see if she can get assistance.    Hyperlipidemia: Taking Lovaza and Rosuvastatin and LDL , last LDL was at  goal - 53, continue medications. Denies myopathy . We will recheck it today    HTN: She is on AZOR for now ( Norvasc plus Benicar ) Denies chest pain, palpitation or dizziness. She has DM also and proteinuria, under the care of nephrologist , GFR is normal. We will check labs today    Gout:  taking Allopurinol daily, denies side effects. She has been compliant with medication . No recent episodes We will recheck uric acid    History of  left hip replacement on 12/05/2019 by Dr. Harlow Mares, she completed PT but continues to have some tightness on left outer thigh near hip, stable   Right ulnar nerve release: doing better, able to extend all fingers and will get PT now   [id number] in 2021, seeing Dr. Posey Pronto, could not take methotrexate because of elevation of liver enzymes,  she has been taking plaquenil and is getting Cetrolizumab injections again since she has Medicare part D. Still has stiffness and joint aches but stable with medications   Patient Active Problem List   Diagnosis Date Noted   History of decompression of ulnar nerve 06/04/2022   Dyslipidemia associated with type 2 diabetes mellitus ([place]) 06/04/2022   Cubital tunnel syndrome, right 07/16/2020    Meralgia paraesthetica, left 07/16/2020   Ulnar neuropathy at elbow, right 07/16/2020   Numbness and tingling 06/10/2020   Encounter for long-term (current) use of high-risk medication 06/02/2020   LFT elevation 06/02/2020   Osteoarthritis of spine with radiculopathy, cervical region 06/02/2020   Primary osteoarthritis involving multiple joints 06/02/2020   Rheumatoid arthritis of multiple sites with negative rheumatoid factor ([place]) 06/02/2020   Acute posthemorrhagic anemia 03/27/2020   Hip pain 03/27/2020   Osteoarthritis 03/27/2020   Sinus tachycardia 03/27/2020   History of total hip arthroplasty 12/18/2019   Proteinuria, unspecified 10/10/2019   Microcalcification of left breast on mammogram 05/03/2017   Herniated intervertebral disc of lumbar spine 09/02/2016   Spondylolisthesis of lumbar region 09/02/2016   Type 2 diabetes mellitus with albuminuria ([place]) 02/24/2015   Vitamin D deficiency 02/24/2015   Perennial allergic rhinitis 02/24/2015   Hypercalcemia 02/24/2015   GERD without esophagitis 02/24/2015   Hypertension, benign 02/24/2015   Controlled gout 02/24/2015   Fatty liver 02/24/2015   Callus of foot 02/24/2015   Primary osteoarthritis of right hip 02/24/2015   History of carpal tunnel syndrome 02/24/2015   Obesity (BMI 30-39.9) Q000111Q   Umbilical hernia without obstruction or gangrene 02/24/2015   Intermittent low back pain 02/24/2015   History of colonic polyps 02/24/2015   Dyslipidemia 02/24/2015    Past Surgical History:  Procedure Laterality Date   ABDOMINAL HYSTERECTOMY  2012   ANTERIOR INTEROSSEOUS NERVE DECOMPRESSION Right 12/16/2020   Procedure: Right ulnar nerve release, elbow;  Surgeon: Hessie Knows, MD;  Location: [facility];  Service: Orthopedics;  Laterality: Right;   ANTERIOR INTEROSSEOUS NERVE DECOMPRESSION Right 08/24/2022   Procedure: Repeat ulnar nerve release at elbow;  Surgeon: Hessie Knows, MD;  Location: [place];  Service:  Orthopedics;  Laterality: Right;  Diabetic   APPENDECTOMY  2012   BREAST BIOPSY Right 07/12/2012   Negative   BREAST BIOPSY Left 05/09/2017   Affirm Bx of two areas-FIBROADENOMA WITH COARSE CALCIFICATION. NEGATIVE FOR ATYPIA,COLLAPSED CYST AND FIBROADENOMATOID CHANGE   COLONOSCOPY  2009   Dr Allen Norris   COLONOSCOPY WITH PROPOFOL N/A 07/27/2017   Procedure: COLONOSCOPY WITH PROPOFOL;  Surgeon: Robert Bellow, MD;  [facility];  Service: Endoscopy;  Laterality: N/A;   DILATION AND CURETTAGE OF UTERUS  2007   REPLACEMENT TOTAL HIP W/  RESURFACING IMPLANTS Left 11/2019   TUBAL LIGATION      Family History  Problem Relation Age of Onset   Multiple sclerosis Mother    Heart attack Father    Kidney failure Sister    Heart attack Brother    Diabetes Daughter    CAD Sister    Arthritis Sister        RA   Lupus Sister    Breast cancer Neg Hx    Colon cancer Neg Hx     Social History   Tobacco Use   Smoking status: Never   Smokeless tobacco: Never  Substance Use Topics   Alcohol use: No    Alcohol/week: 0.0 standard drinks of alcohol     Current Outpatient Medications:    acetaminophen (TYLENOL) 500 MG tablet, Take 500 mg by mouth daily., Disp: , Rfl:    allopurinol (ZYLOPRIM) 300 MG tablet, TAKE 1 TABLET(300 MG) BY MOUTH TWICE DAILY, Disp: 180 tablet, Rfl: 0   amLODipine-olmesartan (AZOR) 5-40 MG tablet, TAKE 1 TABLET BY MOUTH DAILY, Disp: 90 tablet, Rfl: 1   Ascorbic Acid (VITAMIN C) POWD, Take 1,000 mg by mouth daily., Disp: , Rfl:    aspirin 81 MG chewable tablet, Chew 81 mg by mouth daily., Disp: , Rfl:    baclofen (LIORESAL) 10 MG tablet, TAKE 1 TABLET(10 MG) BY MOUTH THREE TIMES DAILY AS NEEDED FOR MUSCLE SPASMS, Disp: 90 tablet, Rfl: 0   blood glucose meter kit and supplies, Dispense based on patient and insurance preference. Use up to four times daily as directed. (FOR ICD-10 E10.9, E11.9). Check fsbs up to twice daily, Disp: 1 each, Rfl: 0   Certolizumab  Pegol 2 X 200 MG/ML KIT, Inject 1 each into the skin every 30 (thirty) days. Last dose:07/24/22.  Next dose:08/31/22, Disp: , Rfl:    Cholecalciferol 25 MCG (1000 UT) tablet, Take 1,000 Units by mouth in the morning and at bedtime., Disp: , Rfl:    Coenzyme Q10 (COQ10) 100 MG CAPS, Take 100 mg by mouth 2 (two) times daily., Disp: , Rfl:    Dapagliflozin Pro-metFORMIN ER (XIGDUO XR) 05-999 MG TB24, Take 1 tablet by mouth daily., Disp: 90 tablet, Rfl: 0   diclofenac Sodium (VOLTAREN) 1 % GEL, Apply 2 g topically 4 (four) times daily., Disp: 100 g, Rfl: 2   fluconazole (DIFLUCAN) 150 MG tablet, TAKE 1 TABLET BY MOUTH TWICE WEEKLY AND AS NEEDED, Disp: 24 tablet, Rfl: 0   fluticasone (FLONASE) 50 MCG/ACT nasal spray, SHAKE LIQUID AND USE  2 SPRAYS IN EACH NOSTRIL DAILY, Disp: 48 g, Rfl: 0   hydroxychloroquine (PLAQUENIL) 200 MG tablet, Take 200 mg by mouth 2 (two) times daily., Disp: , Rfl:    Lancets (ONETOUCH DELICA PLUS Q000111Q) MISC, TEST UP TO FOUR TIMES DAILY, Disp: 100 each, Rfl: 11   levocetirizine (XYZAL) 5 MG tablet, TAKE 1 TABLET(5 MG) BY MOUTH EVERY EVENING, Disp: 90 tablet, Rfl: 1   montelukast (SINGULAIR) 10 MG tablet, TAKE 1 TABLET(10 MG) BY MOUTH AT BEDTIME, Disp: 90 tablet, Rfl: 1   Multiple Vitamin (MULTIVITAMIN WITH MINERALS) TABS tablet, Take 1 tablet by mouth daily., Disp: , Rfl:    omega-3 acid ethyl esters (LOVAZA) 1 g capsule, Take 2 capsules (2 g total) by mouth 2 (two) times daily., Disp: 360 capsule, Rfl: 0   ONETOUCH ULTRA test strip, TEST UP TO FOUR TIMES DAILY, Disp: 300 strip, Rfl: 1   OZEMPIC, 1 MG/DOSE, 4 MG/3ML SOPN, INJECT 1 MG UNDER THE SKIN ONCE A WEEK, Disp: 9 mL, Rfl: 1   polyethylene glycol powder (GLYCOLAX/MIRALAX) 17 GM/SCOOP powder, Take 17 g by mouth daily as needed for mild constipation or moderate constipation., Disp: 850 g, Rfl: 0   pregabalin (LYRICA) 75 MG capsule, TAKE 1 CAPSULE BY MOUTH THREE TIMES DAILY. TAKE 1 CAPSULE (75 MG) IN THE MORNING AND 2  CAPSULES (150 MG) IN THE EVENING, Disp: 270 capsule, Rfl: 0   rosuvastatin (CRESTOR) 40 MG tablet, TAKE 1 TABLET(40 MG) BY MOUTH DAILY, Disp: 90 tablet, Rfl: 1   HYDROcodone-acetaminophen (NORCO) 5-325 MG tablet, Take 1 tablet by mouth every 4 (four) hours as needed for moderate pain. (Patient not taking: Reported on 10/15/2022), Disp: 30 tablet, Rfl: 0  Allergies  Allergen Reactions   Ace Inhibitors Cough   Lipofen [Fenofibrate] Other (See Comments)    Localized drug reaction, blister on back   Penicillins Itching   Sulfa Antibiotics Itching    I personally reviewed active problem list, medication list, allergies, family history, social history, health maintenance with the patient/caregiver today.   ROS  Ten systems reviewed and is negative except as mentioned in HPI  Objective  Vitals:   10/15/22 1033  BP: 126/70  Pulse: 96  Resp: 16  SpO2: 98%  Weight: 215 lb (97.5 kg)  Height: '5\' 7"'$  (1.702 m)    Body mass index is 33.67 kg/m.  Physical Exam  Constitutional: Patient appears well-developed and well-nourished. Obese  No distress.  HEENT: head atraumatic, normocephalic, pupils equal and reactive to light, neck supple Cardiovascular: Normal rate, regular rhythm and normal heart sounds.  No murmur heard. No BLE edema. Pulmonary/Chest: Effort normal and breath sounds normal. No respiratory distress. Abdominal: Soft.  There is no tenderness. Psychiatric: Patient has a normal mood and affect. behavior is normal. Judgment and thought content normal.   Recent Results (from the past 2160 hour(s))  Glucose, capillary     Status: Abnormal   Collection Time: 08/24/22 11:17 AM  Result Value Ref Range   Glucose-Capillary 124 (H) 70 - 99 mg/dL    Comment: Glucose reference range applies only to samples taken after fasting for at least 8 hours.  POCT HgB A1C     Status: Abnormal   Collection Time: 10/15/22 10:33 AM  Result Value Ref Range   Hemoglobin A1C 7.3 (A) 4.0 - 5.6 %    HbA1c POC (<> result, manual entry)     HbA1c, POC (prediabetic range)     HbA1c, POC (controlled diabetic range)  PHQ2/9:    10/15/2022   10:37 AM 08/13/2022   10:29 AM 06/04/2022    8:37 AM 12/03/2021    9:29 AM 06/03/2021    9:36 AM  Depression screen PHQ 2/9  Decreased Interest 0 0 0 0 0  Down, Depressed, Hopeless 0 0 0 0 0  PHQ - 2 Score 0 0 0 0 0  Altered sleeping 0 0 0 0 0  Tired, decreased energy 0 0 0 0 0  Change in appetite 0 0 0 0 0  Feeling bad or failure about yourself  0 0 0 0 0  Trouble concentrating 0 0 0 0 0  Moving slowly or fidgety/restless 0 0 0 0 0  Suicidal thoughts 0 0 0 0 0  PHQ-9 Score 0 0 0 0 0  Difficult doing work/chores   Not difficult at all      phq 9 is negative   Fall Risk:    10/15/2022   10:36 AM 08/13/2022   10:29 AM 06/04/2022    8:27 AM 12/03/2021    9:29 AM 06/03/2021    9:36 AM  Fall Risk   Falls in the past year? 0 0 0 0 0  Number falls in past yr: 0 0 0  0  Injury with Fall? 0 0 0  0  Risk for fall due to : No Fall Risks No Fall Risks  No Fall Risks No Fall Risks  Follow up Falls prevention discussed Falls prevention discussed  Falls prevention discussed Falls prevention discussed      Functional Status Survey: Is the patient deaf or have difficulty hearing?: No Does the patient have difficulty seeing, even when wearing glasses/contacts?: No Does the patient have difficulty concentrating, remembering, or making decisions?: No Does the patient have difficulty walking or climbing stairs?: No Does the patient have difficulty dressing or bathing?: No Does the patient have difficulty doing errands alone such as visiting a doctor's office or shopping?: No    Assessment & Plan  1. Dyslipidemia associated with type 2 diabetes mellitus ([place])  - POCT HgB A1C - AMB Referral to Pharmacy Medication Management - omega-3 acid ethyl esters (LOVAZA) 1 g capsule; Take 2 capsules (2 g total) by mouth 2 (two) times daily.  Dispense: 360  capsule; Refill: 0 - Lipid panel  2. Rheumatoid arthritis of multiple sites with negative rheumatoid factor (HCC)  Under the care of Dr. Posey Pronto  3. Type 2 diabetes mellitus with albuminuria (HCC)  - Comprehensive metabolic panel - Parathyroid hormone, intact (no Ca) - Urine Microalbumin w/creat. ratio  4. Controlled gout  - allopurinol (ZYLOPRIM) 300 MG tablet; TAKE 1 TABLET(300 MG) BY MOUTH TWICE DAILY  Dispense: 180 tablet; Refill: 1 - Uric acid  5. Primary osteoarthritis involving multiple joints  Stable  6. History of decompression of ulnar nerve  Doing better, going to start PT  7. Hypertension, benign  - amLODipine-olmesartan (AZOR) 5-40 MG tablet; Take 1 tablet by mouth daily.  Dispense: 90 tablet; Refill: 1 - CBC with Differential/Platelet  8. Vitamin D deficiency  - baclofen (LIORESAL) 10 MG tablet; Take 1 tablet (10 mg total) by mouth at bedtime.  Dispense: 90 tablet; Refill: 1 - VITAMIN D 25 Hydroxy (Vit-D Deficiency, Fractures)  9. Yeast vaginitis  - fluconazole (DIFLUCAN) 150 MG tablet; TAKE 1 TABLET BY MOUTH TWICE WEEKLY AND AS NEEDED  Dispense: 24 tablet; Refill: 0  10. Perennial allergic rhinitis  - montelukast (SINGULAIR) 10 MG tablet; Take 1 tablet (10 mg total)  by mouth at bedtime.  Dispense: 90 tablet; Refill: 1  11. Dyslipidemia  - montelukast (SINGULAIR) 10 MG tablet; Take 1 tablet (10 mg total) by mouth at bedtime.  Dispense: 90 tablet; Refill: 1

## 2022-10-15 ENCOUNTER — Encounter: Payer: Self-pay | Admitting: Family Medicine

## 2022-10-15 ENCOUNTER — Ambulatory Visit (INDEPENDENT_AMBULATORY_CARE_PROVIDER_SITE_OTHER): Payer: Medicare HMO | Admitting: Family Medicine

## 2022-10-15 ENCOUNTER — Other Ambulatory Visit: Payer: Self-pay | Admitting: Family Medicine

## 2022-10-15 VITALS — BP 126/70 | HR 96 | Resp 16 | Ht 67.0 in | Wt 215.0 lb

## 2022-10-15 DIAGNOSIS — I10 Essential (primary) hypertension: Secondary | ICD-10-CM

## 2022-10-15 DIAGNOSIS — B3731 Acute candidiasis of vulva and vagina: Secondary | ICD-10-CM | POA: Diagnosis not present

## 2022-10-15 DIAGNOSIS — Z9889 Other specified postprocedural states: Secondary | ICD-10-CM

## 2022-10-15 DIAGNOSIS — E559 Vitamin D deficiency, unspecified: Secondary | ICD-10-CM

## 2022-10-15 DIAGNOSIS — E785 Hyperlipidemia, unspecified: Secondary | ICD-10-CM

## 2022-10-15 DIAGNOSIS — E1169 Type 2 diabetes mellitus with other specified complication: Secondary | ICD-10-CM | POA: Diagnosis not present

## 2022-10-15 DIAGNOSIS — R809 Proteinuria, unspecified: Secondary | ICD-10-CM

## 2022-10-15 DIAGNOSIS — M0609 Rheumatoid arthritis without rheumatoid factor, multiple sites: Secondary | ICD-10-CM

## 2022-10-15 DIAGNOSIS — M109 Gout, unspecified: Secondary | ICD-10-CM

## 2022-10-15 DIAGNOSIS — J3089 Other allergic rhinitis: Secondary | ICD-10-CM

## 2022-10-15 DIAGNOSIS — E1129 Type 2 diabetes mellitus with other diabetic kidney complication: Secondary | ICD-10-CM | POA: Diagnosis not present

## 2022-10-15 DIAGNOSIS — M159 Polyosteoarthritis, unspecified: Secondary | ICD-10-CM

## 2022-10-15 LAB — POCT GLYCOSYLATED HEMOGLOBIN (HGB A1C): Hemoglobin A1C: 7.3 % — AB (ref 4.0–5.6)

## 2022-10-15 MED ORDER — MONTELUKAST SODIUM 10 MG PO TABS: 10.0000 mg | ORAL_TABLET | Freq: Every day | ORAL | 1 refills | Status: DC

## 2022-10-15 MED ORDER — FLUCONAZOLE 150 MG PO TABS: ORAL_TABLET | ORAL | 0 refills | Status: DC

## 2022-10-15 MED ORDER — OMEGA-3-ACID ETHYL ESTERS 1 G PO CAPS: 2.0000 | ORAL_CAPSULE | Freq: Two times a day (BID) | ORAL | 0 refills | Status: DC

## 2022-10-15 MED ORDER — ALLOPURINOL 300 MG PO TABS: ORAL_TABLET | ORAL | 1 refills | Status: DC

## 2022-10-15 MED ORDER — BACLOFEN 10 MG PO TABS: 10.0000 mg | ORAL_TABLET | Freq: Every day | ORAL | 1 refills | Status: DC

## 2022-10-15 MED ORDER — AMLODIPINE-OLMESARTAN 5-40 MG PO TABS: 1.0000 | ORAL_TABLET | Freq: Every day | ORAL | 1 refills | Status: DC

## 2022-10-18 LAB — COMPREHENSIVE METABOLIC PANEL
ALT: 30 IU/L (ref 0–32)
AST: 35 IU/L (ref 0–40)
Albumin/Globulin Ratio: 2 (ref 1.2–2.2)
Albumin: 5 g/dL — ABNORMAL HIGH (ref 3.9–4.9)
Alkaline Phosphatase: 81 IU/L (ref 44–121)
BUN/Creatinine Ratio: 23 (ref 12–28)
BUN: 13 mg/dL (ref 8–27)
Bilirubin Total: 0.5 mg/dL (ref 0.0–1.2)
CO2: 22 mmol/L (ref 20–29)
Calcium: 9.7 mg/dL (ref 8.7–10.3)
Chloride: 102 mmol/L (ref 96–106)
Creatinine, Ser: 0.57 mg/dL (ref 0.57–1.00)
Globulin, Total: 2.5 g/dL (ref 1.5–4.5)
Glucose: 122 mg/dL — ABNORMAL HIGH (ref 70–99)
Potassium: 4.1 mmol/L (ref 3.5–5.2)
Sodium: 141 mmol/L (ref 134–144)
Total Protein: 7.5 g/dL (ref 6.0–8.5)
eGFR: 101 mL/min/{1.73_m2} (ref >59)

## 2022-10-18 LAB — CBC WITH DIFFERENTIAL/PLATELET
Basophils Absolute: 0 10*3/uL (ref 0.0–0.2)
Basos: 1 %
EOS (ABSOLUTE): 0.1 10*3/uL (ref 0.0–0.4)
Eos: 1 %
Hematocrit: 37.3 % (ref 34.0–46.6)
Hemoglobin: 12.6 g/dL (ref 11.1–15.9)
Immature Grans (Abs): 0 10*3/uL (ref 0.0–0.1)
Immature Granulocytes: 0 %
Lymphocytes Absolute: 2.6 10*3/uL (ref 0.7–3.1)
Lymphs: 57 %
MCH: 30.7 pg (ref 26.6–33.0)
MCHC: 33.8 g/dL (ref 31.5–35.7)
MCV: 91 fL (ref 79–97)
Monocytes Absolute: 0.4 10*3/uL (ref 0.1–0.9)
Monocytes: 8 %
Neutrophils Absolute: 1.5 10*3/uL (ref 1.4–7.0)
Neutrophils: 33 %
Platelets: 222 10*3/uL (ref 150–450)
RBC: 4.1 x10E6/uL (ref 3.77–5.28)
RDW: 13.2 % (ref 11.7–15.4)
WBC: 4.5 10*3/uL (ref 3.4–10.8)

## 2022-10-18 LAB — LIPID PANEL
Chol/HDL Ratio: 2.4 ratio (ref 0.0–4.4)
Cholesterol, Total: 145 mg/dL (ref 100–199)
HDL: 60 mg/dL (ref >39)
LDL Chol Calc (NIH): 56 mg/dL (ref 0–99)
Triglycerides: 177 mg/dL — ABNORMAL HIGH (ref 0–149)
VLDL Cholesterol Cal: 29 mg/dL (ref 5–40)

## 2022-10-18 LAB — MICROALBUMIN / CREATININE URINE RATIO
Creatinine, Urine: 63.7 mg/dL
Microalb/Creat Ratio: 422 mg/g creat — ABNORMAL HIGH (ref 0–29)
Microalbumin, Urine: 269.1 ug/mL

## 2022-10-18 LAB — PARATHYROID HORMONE, INTACT (NO CA): PTH: 29 pg/mL (ref 15–65)

## 2022-10-18 LAB — URIC ACID: Uric Acid: 3.2 mg/dL (ref 3.0–7.2)

## 2022-10-18 LAB — VITAMIN D 25 HYDROXY (VIT D DEFICIENCY, FRACTURES): Vit D, 25-Hydroxy: 51.6 ng/mL (ref 30.0–100.0)

## 2022-10-20 ENCOUNTER — Telehealth: Payer: Self-pay

## 2022-10-20 ENCOUNTER — Other Ambulatory Visit: Payer: PRIVATE HEALTH INSURANCE | Admitting: Pharmacist

## 2022-10-20 NOTE — Progress Notes (Unsigned)
10/20/2022 Name: Maria Cruz MRN: [id number] DOB: August 16, 1955  Chief Complaint  Patient presents with   Medication Assistance    Maria Cruz is a 67 y.o. year old female who presented for a telephone visit.   They were referred to the pharmacist by their PCP for assistance in managing medication access.   Conversation today cut short as patient shares that she is currently holding with Social Security office on the other line and hangs up to return to this call  Subjective:  Care Team: Primary Care Provider: Steele Sizer, MD ; Next Scheduled Visit: 02/18/2023 Neurologist: Vickki Hearing, MD; Next Scheduled Visit: 11/16/2022 Rheumatologist: Shirlyn Goltz, MD; Next Scheduled Visit: 01/13/2023 Nephrologist: Beverly Milch, AGNP-C; Next Scheduled Visit: 02/21/2023  Medication Access/Adherence  [facility] [place] [place] [facility] [address] [place] [address] Phone: [phone] Fax: [place] [address] [place]) [place] [address] Phone: [phone] Fax: [place], [place] [address] Phone: [phone] Fax: [phone]  [id number] [place] [place]: [phone]: [phone]  Patient reports affordability concerns with their medications: Yes , reports cost of Xigduo ER, Ozempic and Cimzia are difficult to afford Patient reports access/transportation concerns to their pharmacy: No  Patient reports adherence concerns with their medications:  No     Diabetes:  Current medications:  - Xigduo ER 05-999 mg daily - Ozempic 1 mg weekly on Thursdays  Current glucose readings:  recalls morning fasting readings ranging ~120s   Statin: rosuvastatin 40 mg daily  Current medication access support: none - Based on reported income, patient may be eligible [place] currently holding with Social Security office on the other line during this call - If patient not eligible for Extra Help, note patient meets criteria for patient assistance for Xigduo through [place] and for Ozempic from [employer]  Hypertension:  Current medications: amlodipine-olmesartan 5-40 mg daily  Recalls when last checked home blood pressure, reading ~ 126/70  Patient denies hypotensive s/sx including dizziness, lightheadedness.     Objective:  Lab Results  Component Value Date   HGBA1C 7.3 (A) 10/15/2022    Lab Results  Component Value Date   CREATININE 0.57 10/15/2022   BUN 13 10/15/2022   NA 141 10/15/2022   K 4.1 10/15/2022   CL 102 10/15/2022   CO2 22 10/15/2022    Lab Results  Component Value Date   CHOL 145 10/15/2022   HDL 60 10/15/2022   LDLCALC 56 10/15/2022   TRIG 177 (H) 10/15/2022   CHOLHDL 2.4 10/15/2022   BP Readings from Last 3 Encounters:  10/15/22 126/70  08/24/22 121/75  08/13/22 124/74   Pulse Readings from Last 3 Encounters:  10/15/22 96  08/24/22 80  08/13/22 94     Medications Reviewed Today     Reviewed by BCarlene Coria CMA (Certified Medical Assistant) on 10/15/22 at 1BethpageList Status: <None>   Medication Order Taking? Sig Documenting Provider Last Dose Status Informant  acetaminophen (TYLENOL) 500 MG tablet [id number] Take 500 mg by mouth daily.  [provider] Taking Active   allopurinol (ZYLOPRIM) 300 MG tablet [id number] Yes TAKE 1 TABLET(300 MG) BY MOUTH TWICE DAILY Steele Sizer, MD Taking Active   amLODipine-olmesartan (AZOR) 5-40 MG tablet [id number] Yes TAKE 1 TABLET BY MOUTH DAILY Bo Merino, FNP Taking Active   Ascorbic Acid (VITAMIN C) POWD [id number]  Yes Take 1,000 mg by mouth daily. [provider] Taking Active   aspirin 81 MG chewable tablet [id number] Yes Chew 81 mg by mouth daily. [provider] Taking Active Self  baclofen (LIORESAL) 10 MG tablet [id number] Yes TAKE 1 TABLET(10 MG) BY MOUTH THREE TIMES DAILY AS NEEDED FOR MUSCLE SPASMS Steele Sizer, MD Taking Active   blood glucose meter kit and supplies [id number] Yes Dispense based on patient and insurance preference. Use up to four times daily as directed. (FOR ICD-10 E10.9, E11.9). Check fsbs up to twice daily Steele Sizer, MD Taking Active   Certolizumab Pegol 2 X 200 MG/ML KIT [id number] Yes Inject 1 each into the skin every 30 (thirty) days. Last dose:07/24/22.  Next dose:08/31/22 Quintin Alto, MD Taking Active Self  Cholecalciferol 25 MCG (1000 UT) tablet [id number] Yes Take 1,000 Units by mouth in the morning and at bedtime. [provider] Taking Active Self  Coenzyme Q10 (COQ10) 100 MG CAPS [id number] Yes Take 100 mg by mouth 2 (two) times daily. [provider] Taking Active   Dapagliflozin Pro-metFORMIN ER (XIGDUO XR) 05-999 MG TB24 [id number] Yes Take 1 tablet by mouth daily. Steele Sizer, MD Taking Active   diclofenac Sodium (VOLTAREN) 1 % GEL [id number] Yes Apply 2 g topically 4 (four) times daily. Steele Sizer, MD Taking Active   fluconazole (DIFLUCAN) 150 MG tablet [id number] Yes TAKE 1 TABLET BY MOUTH TWICE WEEKLY AND AS NEEDED Ancil Boozer, Drue Stager, MD Taking Active   fluticasone (FLONASE) 50 MCG/ACT nasal spray [id number] Yes SHAKE LIQUID AND USE 2 SPRAYS IN EACH NOSTRIL DAILY Sowles, Drue Stager, MD Taking Active   HYDROcodone-acetaminophen (NORCO) 5-325 MG tablet [id number] No Take 1 tablet by mouth every 4 (four) hours as needed for moderate pain.  Patient not taking: Reported on 10/15/2022   Hessie Knows, MD Not Taking Consider Medication Status and Discontinue   hydroxychloroquine (PLAQUENIL) 200 MG tablet [id number] Yes Take 200 mg by  mouth 2 (two) times daily. Quintin Alto, MD Taking Active Self  Lancets (ONETOUCH DELICA PLUS Q000111Q) [place] [id number] Yes TEST UP TO FOUR TIMES DAILY Steele Sizer, MD Taking Active   levocetirizine (XYZAL) 5 MG tablet [id number] Yes TAKE 1 TABLET(5 MG) BY MOUTH EVERY Lorelei Pont, Drue Stager, MD Taking Active   montelukast (SINGULAIR) 10 MG tablet [id number] Yes TAKE 1 TABLET(10 MG) BY MOUTH AT BEDTIME Bo Merino, FNP Taking Active   Multiple Vitamin (MULTIVITAMIN WITH MINERALS) TABS tablet [id number] Yes Take 1 tablet by mouth daily. [provider] Taking Active Self  omega-3 acid ethyl esters (LOVAZA) 1 g capsule [id number] Yes Take 2 capsules (2 g total) by mouth 2 (two) times daily. Steele Sizer, MD Taking Active   [facility] ULTRA test strip [id number] Yes TEST UP TO FOUR TIMES DAILY Steele Sizer, MD Taking Active   OZEMPIC, 1 MG/DOSE, 4 MG/3ML SOPN [id number] Yes INJECT 1 MG UNDER THE SKIN ONCE A WEEK Sowles, Drue Stager, MD Taking Active   polyethylene glycol powder (GLYCOLAX/MIRALAX) 17 GM/SCOOP powder [id number] Yes Take 17 g by mouth daily as needed for mild constipation or moderate constipation. Steele Sizer, MD Taking Active   pregabalin (LYRICA) 75 MG capsule [id number] Yes  TAKE 1 CAPSULE BY MOUTH THREE TIMES DAILY. TAKE 1 CAPSULE (75 MG) IN THE MORNING AND 2 CAPSULES (150 MG) IN THE Lorelei Pont, Drue Stager, MD Taking Active   rosuvastatin (CRESTOR) 40 MG tablet [id number] Yes TAKE 1 TABLET(40 MG) BY MOUTH DAILY Ancil Boozer, Drue Stager, MD Taking Active               Assessment/Plan:   Unable to complete medication review today as conversation cut short  Diabetes: - Reviewed long term cardiovascular and renal outcomes of uncontrolled blood sugar - Reviewed goal A1c, goal fasting, and goal 2 hour post prandial glucose - Reviewed dietary modifications including importance of having well-balanced meals while controlling carbohydrate portion sizes - Reviewed lifestyle  modifications including: - Recommend to check glucose, keep log of results and have this record to review during upcoming medical appointments - Patient advised that she is following up with Social Security today regarding eligibility for Extra Help subsidy - If patient finds that she is not eligible for Extra Help, will plan to collaborate with PCP and CPhT for aid to patient with applying for patient assistance for Xigduo through [place] and for Ozempic from [employer]   Hypertension: - Reviewed long term cardiovascular and renal outcomes of uncontrolled blood pressure - Reviewed appropriate blood pressure monitoring technique and reviewed goal blood pressure. Recommended to check home blood pressure and heart rate, keep log of results and have this record to review during upcoming medical appointments    Follow Up Plan: Will ask Care Guide to reach out to patient to schedule follow up appointment.  Wallace Cullens, PharmD, [facility] [phone]

## 2022-10-20 NOTE — Progress Notes (Signed)
   Care Guide Note  10/20/2022 Name: Maria Cruz MRN: [id number] DOB: Mar 14, 1956  Referred by: Maria Sizer, MD Reason for referral : Care Coordination (Outreach to schedule with Pharm d )   Maria Cruz is a 68 y.o. year old female who is a primary care patient of Maria Sizer, MD. Maria Cruz was referred to the pharmacist for assistance related to DM.    Successful contact was made with the patient to discuss pharmacy services including being ready for the pharmacist to call at least 5 minutes before the scheduled appointment time, to have medication bottles and any blood sugar or blood pressure readings ready for review. The patient agreed to meet with the pharmacist via with the pharmacist via telephone visit on (date/time).  11/05/2022  Noreene Larsson, [place], [place] [address] Direct Dial: [phone] [email or web address]

## 2022-10-21 ENCOUNTER — Ambulatory Visit
Admission: RE | Admit: 2022-10-21 | Discharge: 2022-10-21 | Disposition: A | Discharge status: Home / Self Care | Payer: Medicare HMO | Source: Ambulatory Visit | Attending: Family Medicine | Admitting: Family Medicine

## 2022-10-21 DIAGNOSIS — M6281 Muscle weakness (generalized): Secondary | ICD-10-CM | POA: Diagnosis not present

## 2022-10-21 DIAGNOSIS — Z13820 Encounter for screening for osteoporosis: Secondary | ICD-10-CM | POA: Diagnosis not present

## 2022-10-21 DIAGNOSIS — Z0000 Encounter for general adult medical examination without abnormal findings: Secondary | ICD-10-CM

## 2022-10-21 DIAGNOSIS — Z1231 Encounter for screening mammogram for malignant neoplasm of breast: Secondary | ICD-10-CM | POA: Diagnosis not present

## 2022-10-21 DIAGNOSIS — M25511 Pain in right shoulder: Secondary | ICD-10-CM | POA: Diagnosis not present

## 2022-10-21 DIAGNOSIS — E2839 Other primary ovarian failure: Secondary | ICD-10-CM | POA: Diagnosis not present

## 2022-10-21 DIAGNOSIS — M542 Cervicalgia: Secondary | ICD-10-CM | POA: Diagnosis not present

## 2022-10-21 DIAGNOSIS — M436 Torticollis: Secondary | ICD-10-CM | POA: Diagnosis not present

## 2022-10-21 NOTE — Patient Instructions (Signed)
Goals Addressed             This Visit's Progress    Pharmacy Goals       Our goal A1c is less than 7%. This corresponds with fasting sugars less than 130 and 2 hour after meal sugars less than 180. Please keep a log of your results when checking your blood sugar   Our goal bad cholesterol, or LDL, is less than 70 . This is why it is important to continue taking your rosuvastatin.  Check your blood pressure once- twice weekly, and any time you have concerning symptoms like headache, chest pain, dizziness, shortness of breath, or vision changes.   Our goal is less than 130/80.  To appropriately check your blood pressure, make sure you do the following:  1) Avoid caffeine, exercise, or tobacco products for 30 minutes before checking. Empty your bladder. 2) Sit with your back supported in a flat-backed chair. Rest your arm on something flat (arm of the chair, table, etc). 3) Sit still with your feet flat on the floor, resting, for at least 5 minutes.  4) Check your blood pressure. Take 1-2 readings.  5) Write down these readings and bring with you to any provider appointments.  Bring your home blood pressure machine with you to a provider's office for accuracy comparison at least once a year.   Make sure you take your blood pressure medications before you come to any office visit, even if you were asked to fast for labs.  Wallace Cullens, PharmD, [facility] [phone]

## 2022-10-25 ENCOUNTER — Other Ambulatory Visit: Payer: Self-pay | Admitting: Family Medicine

## 2022-10-25 DIAGNOSIS — J3089 Other allergic rhinitis: Secondary | ICD-10-CM

## 2022-10-29 DIAGNOSIS — M542 Cervicalgia: Secondary | ICD-10-CM | POA: Diagnosis not present

## 2022-11-05 ENCOUNTER — Other Ambulatory Visit: Payer: PRIVATE HEALTH INSURANCE | Admitting: Pharmacist

## 2022-11-05 NOTE — Progress Notes (Addendum)
11/05/2022 Name: Maria Cruz MRN: [id number] DOB: October 18, 1955  Chief Complaint  Patient presents with   Medication Assistance    Follow up    Maria Cruz is a 67 y.o. year old female who presented for a telephone visit.   They were referred to the pharmacist by their PCP for assistance in managing medication access.    Subjective:  Care Team: Primary Care Provider: Steele Sizer, MD ; Next Scheduled Visit: 02/18/2023 Neurologist: Vickki Hearing, MD; Next Scheduled Visit: 11/16/2022 Rheumatologist: Shirlyn Goltz, MD; Next Scheduled Visit: 01/13/2023 Nephrologist: Beverly Milch, AGNP-C; Next Scheduled Visit: 02/21/2023  Medication Access/Adherence  Current Pharmacy:  [facility] [place], [place] [facility] [address] [place] [address] Phone: [phone] Fax: [place] [address]), [place] [address] Phone: [phone] Fax: [place], [place] [address] Phone: [phone] Fax: [phone]  [id number] [place], [place] [address] [place] [address] Phone: [phone] Fax: [phone]   Patient reports affordability concerns with their medications: Yes , reports cost of Xigduo ER, Ozempic and Cimzia are difficult to afford Patient reports access/transportation concerns to their pharmacy: No  Patient reports adherence concerns with their medications:  No    Today patient reports that she received Extra Help application in the mail from [employer], completed this and mailed it back to Social Security yesterday.  - Patient prefers to follow up with Clinical Pharmacist in ~1 month to see what is needed at that  time   Objective:  Lab Results  Component Value Date   HGBA1C 7.3 (A) 10/15/2022    Lab Results  Component Value Date   CREATININE 0.57 10/15/2022   BUN 13 10/15/2022   NA 141 10/15/2022   K 4.1 10/15/2022   CL 102 10/15/2022   CO2 22 10/15/2022    Lab Results  Component Value Date   CHOL 145 10/15/2022   HDL 60 10/15/2022   LDLCALC 56 10/15/2022   TRIG 177 (H) 10/15/2022   CHOLHDL 2.4 10/15/2022    Medications Reviewed Today     Reviewed by Carlene Coria, CMA (Certified Medical Assistant) on 10/15/22 at [place] List Status: <None>   Medication Order Taking? Sig Documenting Provider Last Dose Status Informant  acetaminophen (TYLENOL) 500 MG tablet [id number] Yes Take 500 mg by mouth daily. [provider] Taking Active   allopurinol (ZYLOPRIM) 300 MG tablet [id number] Yes TAKE 1 TABLET(300 MG) BY MOUTH TWICE DAILY Steele Sizer, MD Taking Active   amLODipine-olmesartan (AZOR) 5-40 MG tablet [id number] Yes TAKE 1 TABLET BY MOUTH DAILY Bo Merino, FNP Taking Active   Ascorbic Acid (VITAMIN C) POWD [id number] Yes Take 1,000 mg by mouth daily. [provider] Taking Active   aspirin 81 MG chewable tablet [id number] Yes Chew 81 mg by mouth daily. [provider] Taking Active Self  baclofen (LIORESAL) 10 MG tablet [id number] Yes TAKE 1 TABLET(10 MG) BY MOUTH THREE TIMES DAILY AS NEEDED FOR MUSCLE SPASMS Steele Sizer, MD Taking Active   blood glucose meter kit and supplies [id number] Yes Dispense based on patient and insurance preference. Use up to four times daily as directed. (FOR ICD-10  E10.9, E11.9). Check fsbs up to twice daily Steele Sizer, MD Taking Active   Certolizumab Pegol 2 X 200 MG/ML KIT [id number] Yes Inject 1 each into the skin every 30 (thirty) days. Last dose:07/24/22.  Next dose:08/31/22 Quintin Alto, MD Taking Active Self  Cholecalciferol 25 MCG (1000 UT) tablet [id number] Yes Take 1,000 Units by mouth in the morning and at  bedtime. [provider] Taking Active Self  Coenzyme Q10 (COQ10) 100 MG CAPS [id number] Yes Take 100 mg by mouth 2 (two) times daily. [provider] Taking Active   Dapagliflozin Pro-metFORMIN ER (XIGDUO XR) 05-999 MG TB24 [id number] Yes Take 1 tablet by mouth daily. Steele Sizer, MD Taking Active   diclofenac Sodium (VOLTAREN) 1 % GEL [id number] Yes Apply 2 g topically 4 (four) times daily. Steele Sizer, MD Taking Active   fluconazole (DIFLUCAN) 150 MG tablet [id number] Yes TAKE 1 TABLET BY MOUTH TWICE WEEKLY AND AS NEEDED Ancil Boozer, Drue Stager, MD Taking Active   fluticasone (FLONASE) 50 MCG/ACT nasal spray [id number] Yes SHAKE LIQUID AND USE 2 SPRAYS IN EACH NOSTRIL DAILY Sowles, Drue Stager, MD Taking Active   HYDROcodone-acetaminophen (NORCO) 5-325 MG tablet [id number] No Take 1 tablet by mouth every 4 (four) hours as needed for moderate pain.  Patient not taking: Reported on 10/15/2022   Hessie Knows, MD Not Taking Consider Medication Status and Discontinue   hydroxychloroquine (PLAQUENIL) 200 MG tablet [id number] Yes Take 200 mg by mouth 2 (two) times daily. Quintin Alto, MD Taking Active Self  Lancets (ONETOUCH DELICA PLUS [id number]) [place] [id number] Yes TEST UP TO FOUR TIMES DAILY Steele Sizer, MD Taking Active   levocetirizine (XYZAL) 5 MG tablet [id number] Yes TAKE 1 TABLET(5 MG) BY MOUTH EVERY Lorelei Pont, Drue Stager, MD Taking Active   montelukast (SINGULAIR) 10 MG tablet [id number] Yes TAKE 1 TABLET(10 MG) BY MOUTH AT BEDTIME Bo Merino, FNP Taking Active   Multiple Vitamin (MULTIVITAMIN WITH MINERALS) TABS tablet [id number] Yes Take 1 tablet by mouth daily. [provider] Taking Active Self  omega-3 acid ethyl esters (LOVAZA) 1 g capsule [id number] Yes Take 2 capsules (2 g total) by mouth 2 (two) times daily. Steele Sizer, MD Taking Active   [facility] ULTRA test strip [id number] Yes TEST UP TO FOUR TIMES DAILY Steele Sizer, MD Taking Active   OZEMPIC, 1  MG/DOSE, 4 MG/3ML SOPN [id number] Yes INJECT 1 MG UNDER THE SKIN ONCE A WEEK Sowles, Drue Stager, MD Taking Active   polyethylene glycol powder (GLYCOLAX/MIRALAX) 17 GM/SCOOP powder [id number] Yes Take 17 g by mouth daily as needed for mild constipation or moderate constipation. Steele Sizer, MD Taking Active   pregabalin (LYRICA) 75 MG capsule [id number] Yes TAKE 1 CAPSULE BY MOUTH THREE TIMES DAILY. TAKE 1 CAPSULE (75 MG) IN THE MORNING AND 2 CAPSULES (150 MG) IN THE Lorelei Pont, Drue Stager, MD Taking Active   rosuvastatin (CRESTOR) 40 MG tablet [id number] Yes TAKE 1 TABLET(40 MG) BY MOUTH DAILY Ancil Boozer, Drue Stager, MD Taking Active               Assessment/Plan:   Unable to complete medication review today. Patient prefers to follow up further next month.  Patient denies interest in referral to Social Work for help with resources at this time.  - Patient mailed Extra Help application back to Social Security yesterday - Counsel patient to watch for a response from Social Security via mail to let her know whether she is approved or denied. Counsel on importance of retaining this letter from [employer], even  if she is denied for next steps - Counsel if patient finds that she is not eligible for Extra Help, will plan to collaborate with PCP and CPhT for aid to patient with applying for patient assistance for Xigduo through [employer] and for Ozempic from [place]: Clinical Pharmacist will follow up with patient again by telephone on 12/03/2022 at 10 am   Wallace Cullens, PharmD, [facility] [phone]

## 2022-11-05 NOTE — Patient Instructions (Signed)
Goals Addressed             This Visit's Progress    Pharmacy Goals       Our goal A1c is less than 7%. This corresponds with fasting sugars less than 130 and 2 hour after meal sugars less than 180. Please keep a log of your results when checking your blood sugar   Our goal bad cholesterol, or LDL, is less than 70 . This is why it is important to continue taking your rosuvastatin.  Check your blood pressure once- twice weekly, and any time you have concerning symptoms like headache, chest pain, dizziness, shortness of breath, or vision changes.   Our goal is less than 130/80.  To appropriately check your blood pressure, make sure you do the following:  1) Avoid caffeine, exercise, or tobacco products for 30 minutes before checking. Empty your bladder. 2) Sit with your back supported in a flat-backed chair. Rest your arm on something flat (arm of the chair, table, etc). 3) Sit still with your feet flat on the floor, resting, for at least 5 minutes.  4) Check your blood pressure. Take 1-2 readings.  5) Write down these readings and bring with you to any provider appointments.  Bring your home blood pressure machine with you to a provider's office for accuracy comparison at least once a year.   Make sure you take your blood pressure medications before you come to any office visit, even if you were asked to fast for labs.  Maela Takeda Elena Davia, PharmD, BCACP [facility] [phone]         

## 2022-11-11 DIAGNOSIS — M542 Cervicalgia: Secondary | ICD-10-CM | POA: Diagnosis not present

## 2022-11-12 ENCOUNTER — Other Ambulatory Visit: Payer: Self-pay | Admitting: Family Medicine

## 2022-11-12 DIAGNOSIS — R200 Anesthesia of skin: Secondary | ICD-10-CM | POA: Diagnosis not present

## 2022-11-12 DIAGNOSIS — B3731 Acute candidiasis of vulva and vagina: Secondary | ICD-10-CM

## 2022-11-12 DIAGNOSIS — M542 Cervicalgia: Secondary | ICD-10-CM | POA: Diagnosis not present

## 2022-11-12 DIAGNOSIS — G8929 Other chronic pain: Secondary | ICD-10-CM | POA: Diagnosis not present

## 2022-11-12 DIAGNOSIS — R202 Paresthesia of skin: Secondary | ICD-10-CM | POA: Diagnosis not present

## 2022-11-12 DIAGNOSIS — G5621 Lesion of ulnar nerve, right upper limb: Secondary | ICD-10-CM | POA: Diagnosis not present

## 2022-11-12 DIAGNOSIS — J3089 Other allergic rhinitis: Secondary | ICD-10-CM

## 2022-11-12 DIAGNOSIS — M25511 Pain in right shoulder: Secondary | ICD-10-CM | POA: Diagnosis not present

## 2022-11-12 NOTE — Telephone Encounter (Signed)
Requested medication (s) are due for refill today: yes  Requested medication (s) are on the active medication list: yes  Last refill:  diflucan- 10/15/22 #24 0 refills, flonase- 10/25/22 #48g 0 refills  Future visit scheduled: yes in 3 months   Notes to clinic:  medication not assigned to a protocol. Do you want to refill Rx diflucan? No refills remain for flonase. How many refills for flonase?     Requested Prescriptions  Pending Prescriptions Disp Refills   fluconazole (DIFLUCAN) 150 MG tablet [Pharmacy Med Name: FLUCONAZOLE 150 MG TABLET] 24 tablet 0    Sig: TAKE 1 TABLET BY MOUTH TWICE WEEKLY AND AS NEEDED     Off-Protocol Failed - 11/12/2022  1:27 PM      Failed - Medication not assigned to a protocol, review manually.      Passed - Valid encounter within last 12 months    Recent Outpatient Visits           4 weeks ago Dyslipidemia associated with type 2 diabetes mellitus [facility])   [place] [facility] Alba Cory, MD   3 months ago [facility] [employer] preventive visit   [facility] [place], Danna Hefty, MD   5 months ago Dyslipidemia associated with type 2 diabetes mellitus [facility])   [place] [facility] Alba Cory, MD   11 months ago Dyslipidemia associated with type 2 diabetes mellitus [facility])   [place] [facility] Alba Cory, MD   1 year ago Dyslipidemia associated with type 2 diabetes mellitus [facility])   [place] [facility] Alba Cory, MD       Future Appointments             In 3 months Carlynn Purl, Danna Hefty, MD [facility]             fluticasone [facility]) 50 MCG/ACT nasal spray [Pharmacy Med Name: FLUTICASONE PROP 50 MCG SPRAY] 48 mL     Sig: SHAKE LIQUID AND USE 2 SPRAYS IN EACH NOSTRIL DAILY     Ear, Nose, and Throat: Nasal Preparations - Corticosteroids Passed - 11/12/2022  1:27 PM      Passed - Valid encounter within last 12  months    Recent Outpatient Visits           4 weeks ago Dyslipidemia associated with type 2 diabetes mellitus [facility])   [place] [facility] Alba Cory, MD   3 months ago [facility] [employer] preventive visit   [facility] [place], Danna Hefty, MD   5 months ago Dyslipidemia associated with type 2 diabetes mellitus [facility])   [place] Alba Cory, MD   11 months ago Dyslipidemia associated with type 2 diabetes mellitus [facility])   [place] Alba Cory, MD   1 year ago Dyslipidemia associated with type 2 diabetes mellitus [facility])   [place] [facility] Alba Cory, MD       Future Appointments             In 3 months Carlynn Purl, Danna Hefty, MD [facility], [facility]

## 2022-11-26 DIAGNOSIS — M542 Cervicalgia: Secondary | ICD-10-CM | POA: Diagnosis not present

## 2022-12-03 ENCOUNTER — Telehealth: Payer: Self-pay

## 2022-12-03 ENCOUNTER — Other Ambulatory Visit: Payer: PRIVATE HEALTH INSURANCE | Admitting: Pharmacist

## 2022-12-03 ENCOUNTER — Encounter: Payer: Self-pay | Admitting: Pharmacist

## 2022-12-03 NOTE — Telephone Encounter (Signed)
Maria Cruz let me know the patient has received a denial for Extra Help subsidy. She stated she will proceed with applying for PAP for both Xigduo and Ozempic. However, patient says that she has since entered coverage gap and is out of meds. Per pharmacy, patient is not actually in coverage gap as she thought, but still having difficulty affording brand copayment's. Maria Cruz has a 14-month supply free trial [place] card she is giving her.

## 2022-12-03 NOTE — Patient Instructions (Signed)
Goals Addressed             This Visit's Progress    Pharmacy Goals       Please watch the mail for an envelope from [facility] containing the patient assistance program application. Please complete this application and bring to office to have it faxed back to Attention: Linward Foster at Fax # [phone] along with a copy of your proof of income document, and copy of the Extra Help denial letter.  Our goal A1c is less than 7%. This corresponds with fasting sugars less than 130 and 2 hour after meal sugars less than 180. Please keep a log of your results when checking your blood sugar   Our goal bad cholesterol, or LDL, is less than 70 . This is why it is important to continue taking your rosuvastatin.  Check your blood pressure once- twice weekly, and any time you have concerning symptoms like headache, chest pain, dizziness, shortness of breath, or vision changes.   Our goal is less than 130/80.  To appropriately check your blood pressure, make sure you do the following:  1) Avoid caffeine, exercise, or tobacco products for 30 minutes before checking. Empty your bladder. 2) Sit with your back supported in a flat-backed chair. Rest your arm on something flat (arm of the chair, table, etc). 3) Sit still with your feet flat on the floor, resting, for at least 5 minutes.  4) Check your blood pressure. Take 1-2 readings.  5) Write down these readings and bring with you to any provider appointments.  Bring your home blood pressure machine with you to a provider's office for accuracy comparison at least once a year.   Make sure you take your blood pressure medications before you come to any office visit, even if you were asked to fast for labs.   Estelle Grumbles, PharmD, [facility] [phone]

## 2022-12-03 NOTE — Progress Notes (Signed)
12/03/2022 Name: Maria Cruz MRN: [id number] DOB: 1956-08-05  Chief Complaint  Patient presents with   Medication Assistance   Medication Management    Maria Cruz is a 67 y.o. year old female who presented for a telephone visit.   They were referred to the pharmacist by their PCP for assistance in managing medication access.   Subjective:  Care Team: Primary Care Provider: Alba Cory, MD ; Next Scheduled Visit: 02/18/2023 Neurologist: Alphonzo Lemmings, MD Rheumatologist: Tracey Harries, MD; Next Scheduled Visit: 01/13/2023 Nephrologist: Juel Burrow, AGNP-C; Next Scheduled Visit: 02/21/2023  Medication Access/Adherence  Current Pharmacy:  [facility] - [place], [place] - One [facility] AT Portal to Registered [address] One [place] [address] Phone: [phone] Fax: [phone]  [facility] [address]), [place] - [address] [place] ([address]) [place] [address] Phone: [phone] Fax: [phone]  [facility][id number] [name], [place] - [address] AT [facility] OF [address] [place] [address] [place] [address] Phone: [phone] Fax: [phone]  [id number] [place], [place] - [address] [place] [address] Phone: [phone] Fax: [phone]   Patient reports affordability concerns with their medications: Yes Patient reports access/transportation concerns to their pharmacy: No  Patient reports adherence concerns with their medications:  No     Today patient reports that she received response from Social Security that she was denied for Extra Help subsidy. Reports is out of her Xigduo XR and Ozempic as cost is now unaffordable at pharmacy.   Diabetes:   Current medications:  - Xigduo ER 05-999 mg daily - Ozempic 1 mg weekly on Thursdays   Current glucose readings: recalls morning fasting readings  ranging ~160-180s   Statin: rosuvastatin 40 mg daily   Current medication access support: none - Patient denied for Extra Help through [employer] and confirms will hold on to this denial letter to use for assistance applications - Based on reported income, meets criteria for patient assistance for Xigduo through [employer] and for Ozempic from [employer]    Objective:  Lab Results  Component Value Date   HGBA1C 7.3 (A) 10/15/2022    Lab Results  Component Value Date   CREATININE 0.57 10/15/2022   BUN 13 10/15/2022   NA 141 10/15/2022   K 4.1 10/15/2022   CL 102 10/15/2022   CO2 22 10/15/2022    Lab Results  Component Value Date   CHOL 145 10/15/2022   HDL 60 10/15/2022   LDLCALC 56 10/15/2022   TRIG 177 (H) 10/15/2022   CHOLHDL 2.4 10/15/2022    Medications Reviewed Today     Reviewed by Dollene Primrose, CMA (Certified Medical Assistant) on 10/15/22 at 1036  Med List Status: <None>   Medication Order Taking? Sig Documenting Provider Last Dose Status Informant  acetaminophen (TYLENOL) 500 MG tablet [id number] Yes Take 500 mg by mouth daily. [provider] Taking Active   allopurinol (ZYLOPRIM) 300 MG tablet [id number] Yes TAKE 1 TABLET(300 MG) BY MOUTH TWICE DAILY Alba Cory, MD Taking Active   amLODipine-olmesartan (AZOR) 5-40 MG tablet [id number] Yes TAKE 1 TABLET BY MOUTH DAILY Berniece Salines, FNP Taking Active   Ascorbic Acid (VITAMIN C) POWD [id number] Yes Take 1,000 mg by mouth daily. [provider] Taking Active   aspirin 81 MG chewable tablet [id number] Yes Chew 81 mg by mouth daily.  [provider] Taking Active Self  baclofen (LIORESAL) 10 MG tablet [id number] Yes TAKE 1 TABLET(10 MG) BY MOUTH THREE TIMES DAILY AS NEEDED FOR MUSCLE SPASMS Alba Cory, MD Taking Active   blood glucose meter kit and supplies [id number] Yes Dispense based on patient and insurance preference. Use up to four times daily as directed. (FOR ICD-10 E10.9,  E11.9). Check fsbs up to twice daily Alba Cory, MD Taking Active   Certolizumab Pegol 2 X 200 MG/ML KIT [id number] Yes Inject 1 each into the skin every 30 (thirty) days. Last dose:07/24/22.  Next dose:08/31/22 Patterson Hammersmith, MD Taking Active Self  Cholecalciferol 25 MCG (1000 UT) tablet [id number] Yes Take 1,000 Units by mouth in the morning and at bedtime. [provider] Taking Active Self  Coenzyme Q10 (COQ10) 100 MG CAPS [id number] Yes Take 100 mg by mouth 2 (two) times daily. [provider] Taking Active   Dapagliflozin Pro-metFORMIN ER (XIGDUO XR) 05-999 MG TB24 [id number] Yes Take 1 tablet by mouth daily. Alba Cory, MD Taking Active   diclofenac Sodium (VOLTAREN) 1 % GEL [id number] Yes Apply 2 g topically 4 (four) times daily. Alba Cory, MD Taking Active   fluconazole (DIFLUCAN) 150 MG tablet [id number] Yes TAKE 1 TABLET BY MOUTH TWICE WEEKLY AND AS NEEDED Carlynn Purl, Danna Hefty, MD Taking Active   fluticasone (FLONASE) 50 MCG/ACT nasal spray [id number] Yes SHAKE LIQUID AND USE 2 SPRAYS IN EACH NOSTRIL DAILY Sowles, Danna Hefty, MD Taking Active   HYDROcodone-acetaminophen (NORCO) 5-325 MG tablet [id number] No Take 1 tablet by mouth every 4 (four) hours as needed for moderate pain.  Patient not taking: Reported on 10/15/2022   Kennedy Bucker, MD Not Taking Consider Medication Status and Discontinue   hydroxychloroquine (PLAQUENIL) 200 MG tablet [id number] Yes Take 200 mg by mouth 2 (two) times daily. Patterson Hammersmith, MD Taking Active Self  Lancets [facility] [place] PLUS [place]) MISC [id number] Yes TEST UP TO FOUR TIMES DAILY Alba Cory, MD Taking Active   levocetirizine (XYZAL) 5 MG tablet [id number] Yes TAKE 1 TABLET(5 MG) BY MOUTH EVERY Mena Goes, Danna Hefty, MD Taking Active   montelukast (SINGULAIR) 10 MG tablet [id number] Yes TAKE 1 TABLET(10 MG) BY MOUTH AT BEDTIME Berniece Salines, FNP Taking Active   Multiple Vitamin (MULTIVITAMIN WITH MINERALS) TABS tablet  [id number] Yes Take 1 tablet by mouth daily. [provider] Taking Active Self  omega-3 acid ethyl esters (LOVAZA) 1 g capsule [id number] Yes Take 2 capsules (2 g total) by mouth 2 (two) times daily. Alba Cory, MD Taking Active   [facility] ULTRA test strip [id number] Yes TEST UP TO FOUR TIMES DAILY Alba Cory, MD Taking Active   OZEMPIC, 1 MG/DOSE, 4 MG/3ML SOPN [id number] Yes INJECT 1 MG UNDER THE SKIN ONCE A WEEK Sowles, Danna Hefty, MD Taking Active   polyethylene glycol powder (GLYCOLAX/MIRALAX) 17 GM/SCOOP powder [id number] Yes Take 17 g by mouth daily as needed for mild constipation or moderate constipation. Alba Cory, MD Taking Active   pregabalin (LYRICA) 75 MG capsule [id number] Yes TAKE 1 CAPSULE BY MOUTH THREE TIMES DAILY. TAKE 1 CAPSULE (75 MG) IN THE MORNING AND 2 CAPSULES (150 MG) IN THE Mena Goes, Danna Hefty, MD Taking Active   rosuvastatin (CRESTOR) 40 MG tablet [id number] Yes TAKE 1 TABLET(40 MG) BY MOUTH DAILY Sowles, Danna Hefty, MD Taking Active               Assessment/Plan:   Diabetes: - Have reviewed goal A1c, goal fasting, and goal 2 hour post prandial glucose -  Reviewed dietary modifications including importance of having well-balanced meals while controlling carbohydrate portion sizes - Recommend to continue to check glucose, keep log of results and have this record to review during upcoming medical appointments - [facility] on behalf of [facility]. Per [facility], patient was quoted 32-month supply cost of Ozempic and Xigduo XR prescriptions ($141 for 90 day supply each). Cost of 30 day supply is $47 for each prescription - Provide [facility] with coupon information for 30-day supply free trial for Xigduo XR from manufacturer website [facility] states coupon not going through, but will contact help desk to try to resolve issue for patient - Collaborate with PCP office. Unfortunately no sample of Ozempic available at this time - Follow up  with patient to provide update. Patient states will follow up with pharmacy to pick up 30 day supplies of both Ozempic and Xigduo XR prescriptions - Will collaborate with PCP and [facility] CPhT for aid to patient with applying for patient assistance for Xigduo through AZ&Me and for Ozempic from [employer] Advise patient to include copies of Extra Help denial letter and income document when returns application to [facility] CPhT  Follow Up Plan: Clinical Pharmacist will follow up with patient by telephone again on 12/22/2022 at 2:30 PM   Estelle Grumbles, PharmD, [facility] [phone]

## 2022-12-04 ENCOUNTER — Other Ambulatory Visit: Payer: Self-pay | Admitting: Family Medicine

## 2022-12-04 DIAGNOSIS — B3731 Acute candidiasis of vulva and vagina: Secondary | ICD-10-CM

## 2022-12-04 DIAGNOSIS — Z9889 Other specified postprocedural states: Secondary | ICD-10-CM

## 2022-12-04 DIAGNOSIS — J3089 Other allergic rhinitis: Secondary | ICD-10-CM

## 2022-12-06 ENCOUNTER — Telehealth: Payer: Self-pay

## 2022-12-06 NOTE — Telephone Encounter (Signed)
PAP application for XIGDUO(AZ&ME) has been mailed to pt home. I will fax PCP pages once I receive pt pages   PAP application for OZEMPIC ([employer]) has been FILED ELECTRONICALLY TO COMPANY  I WILL FOLLOW UP ON.    PLEASE BE ADVISED  Melanee Spry CPhT Rx Patient Advocate [phone]

## 2022-12-13 NOTE — Telephone Encounter (Signed)
I have RECEIVED pt pages and I have FAXED provider pages to office of  Alba Cory MD [phone]   Please be advised  Melanee Spry CPhT Rx Patient Advocate [phone]

## 2022-12-22 ENCOUNTER — Other Ambulatory Visit: Payer: PRIVATE HEALTH INSURANCE | Admitting: Pharmacist

## 2022-12-22 NOTE — Progress Notes (Signed)
12/22/2022 Name: Maria Cruz MRN: [id number] DOB: 1955-08-30  Chief Complaint  Patient presents with   Medication Assistance    KADEEJA RISTINE is a 67 y.o. year old female who presented for a telephone visit.   They were referred to the pharmacist by their PCP for assistance in managing medication access.    Subjective:   Care Team: Primary Care Provider: Alba Cory, MD ; Next Scheduled Visit: 02/18/2023 Neurologist: Alphonzo Lemmings, MD Rheumatologist: Tracey Harries, MD; Next Scheduled Visit: 01/13/2023 Nephrologist: Juel Burrow, AGNP-C; Next Scheduled Visit: 02/21/2023    Medication Access/Adherence  Current Pharmacy:  [facility] - [place], [place] - One [facility] AT Portal to Registered [address] One [place] [address] Phone: [phone] Fax: [phone]  [facility] [address] ([address]), [place] - [address] [place] ([address]) [place] [address] Phone: [phone] Fax: [phone]  [facility] #[id number] [name], [place] - [address] AT [facility] OF [address] [place] [address] [place] [address] Phone: [phone] Fax: [phone]  [id number] [place], [place] - [address] [place] [address] Phone: [phone] Fax: [phone]   Patient reports affordability concerns with their medications: Yes Patient reports access/transportation concerns to their pharmacy: No  Patient reports adherence concerns with their medications:  No       Diabetes:   Current medications:  - Xigduo ER 05-999 mg daily - Ozempic 1 mg weekly on Thursdays (Reports restarted on 5/2)  Reports tolerating well   Reports has been checking home blood sugar, but unable to recall readings and does not have her log with her   Statin: rosuvastatin 40 mg daily   Current medication access support: none - Patient denied for Extra Help through  Social Security - Patient APPROVED for patient assistance for [employer] from [employer] through 08/09/2023  Reports picked up supply from assistance program from office last week - Collaborating with PCP and [facility] CPhT to apply for patient assistance for Xigduo through [employer]  Per conversation with [facility] CPhT, both patient and provider portions of application faxed to [employer] on 12/21/2022   Hypertension:   Current medications: amlodipine-olmesartan 5-40 mg daily   Reports obtained wrist monitor and has been checking home BP, but unable to recall readings and does not have her log with her   Patient denies hypotensive s/sx including dizziness, lightheadedness.     Objective:  Lab Results  Component Value Date   HGBA1C 7.3 (A) 10/15/2022    Lab Results  Component Value Date   CREATININE 0.57 10/15/2022   BUN 13 10/15/2022   NA 141 10/15/2022   K 4.1 10/15/2022   CL 102 10/15/2022   CO2 22 10/15/2022    Lab Results  Component Value Date   CHOL 145 10/15/2022   HDL 60 10/15/2022   LDLCALC 56 10/15/2022   TRIG 177 (H) 10/15/2022   CHOLHDL 2.4 10/15/2022   BP Readings from Last 3 Encounters:  10/15/22 126/70  08/24/22 121/75  08/13/22 124/74   Pulse Readings from Last 3 Encounters:  10/15/22 96  08/24/22 80  08/13/22 94     Medications Reviewed Today     Reviewed by Dollene Primrose, CMA (Certified Medical Assistant) on 10/15/22 at 1036  Med List Status: <None>   Medication Order Taking? Sig Documenting Provider Last Dose Status Informant  acetaminophen (TYLENOL) 500 MG tablet  [id number] Yes Take 500 mg by mouth daily. [provider] Taking Active   allopurinol (ZYLOPRIM) 300 MG tablet [id number] Yes TAKE 1 TABLET(300 MG) BY MOUTH TWICE DAILY Alba Cory, MD Taking Active   amLODipine-olmesartan (AZOR) 5-40 MG tablet [id number] Yes TAKE 1 TABLET BY MOUTH DAILY Berniece Salines, FNP Taking Active   Ascorbic Acid (VITAMIN C) POWD [id number] Yes Take 1,000 mg by  mouth daily. [provider] Taking Active   aspirin 81 MG chewable tablet [id number] Yes Chew 81 mg by mouth daily. [provider] Taking Active Self  baclofen (LIORESAL) 10 MG tablet [id number] Yes TAKE 1 TABLET(10 MG) BY MOUTH THREE TIMES DAILY AS NEEDED FOR MUSCLE SPASMS Alba Cory, MD Taking Active   blood glucose meter kit and supplies [id number] Yes Dispense based on patient and insurance preference. Use up to four times daily as directed. (FOR ICD-10 E10.9, E11.9). Check fsbs up to twice daily Alba Cory, MD Taking Active   Certolizumab Pegol 2 X 200 MG/ML KIT [id number] Yes Inject 1 each into the skin every 30 (thirty) days. Last dose:07/24/22.  Next dose:08/31/22 Patterson Hammersmith, MD Taking Active Self  Cholecalciferol 25 MCG (1000 UT) tablet [id number] Yes Take 1,000 Units by mouth in the morning and at bedtime. [provider] Taking Active Self  Coenzyme Q10 (COQ10) 100 MG CAPS [id number] Yes Take 100 mg by mouth 2 (two) times daily. [provider] Taking Active   Dapagliflozin Pro-metFORMIN ER (XIGDUO XR) 05-999 MG TB24 [id number] Yes Take 1 tablet by mouth daily. Alba Cory, MD Taking Active   diclofenac Sodium (VOLTAREN) 1 % GEL [id number] Yes Apply 2 g topically 4 (four) times daily. Alba Cory, MD Taking Active   fluconazole (DIFLUCAN) 150 MG tablet [id number] Yes TAKE 1 TABLET BY MOUTH TWICE WEEKLY AND AS NEEDED Carlynn Purl, Danna Hefty, MD Taking Active   fluticasone (FLONASE) 50 MCG/ACT nasal spray [id number] Yes SHAKE LIQUID AND USE 2 SPRAYS IN EACH NOSTRIL DAILY Sowles, Danna Hefty, MD Taking Active   HYDROcodone-acetaminophen (NORCO) 5-325 MG tablet [id number] No Take 1 tablet by mouth every 4 (four) hours as needed for moderate pain.  Patient not taking: Reported on 10/15/2022   Kennedy Bucker, MD Not Taking Consider Medication Status and Discontinue   hydroxychloroquine (PLAQUENIL) 200 MG tablet [id number] Yes Take 200 mg by mouth 2 (two) times  daily. Patterson Hammersmith, MD Taking Active Self  Lancets [facility] [place]ose [place] PLUS [place]) MISC [id number] Yes TEST UP TO FOUR TIMES DAILY Alba Cory, MD Taking Active   levocetirizine (XYZAL) 5 MG tablet [id number] Yes TAKE 1 TABLET(5 MG) BY MOUTH EVERY Mena Goes, Danna Hefty, MD Taking Active   montelukast (SINGULAIR) 10 MG tablet [id number] Yes TAKE 1 TABLET(10 MG) BY MOUTH AT BEDTIME Berniece Salines, FNP Taking Active   Multiple Vitamin (MULTIVITAMIN WITH MINERALS) TABS tablet [id number] Yes Take 1 tablet by mouth daily. [provider] Taking Active Self  omega-3 acid ethyl esters (LOVAZA) 1 g capsule [id number] Yes Take 2 capsules (2 g total) by mouth 2 (two) times daily. Alba Cory, MD Taking Active   [facility] ULTRA test strip [id number] Yes TEST UP TO FOUR TIMES DAILY Alba Cory, MD Taking Active   OZEMPIC, 1 MG/DOSE, 4 MG/3ML SOPN [id number] Yes INJECT 1 MG UNDER THE SKIN ONCE A WEEK Sowles, Danna Hefty, MD Taking Active   polyethylene glycol powder (GLYCOLAX/MIRALAX) 17 GM/SCOOP powder [id number] Yes Take 17 g by mouth daily as needed for mild constipation or moderate constipation. Alba Cory, MD Taking Active  pregabalin (LYRICA) 75 MG capsule [id number] Yes TAKE 1 CAPSULE BY MOUTH THREE TIMES DAILY. TAKE 1 CAPSULE (75 MG) IN THE MORNING AND 2 CAPSULES (150 MG) IN THE Mena Goes, Danna Hefty, MD Taking Active   rosuvastatin (CRESTOR) 40 MG tablet [id number] Yes TAKE 1 TABLET(40 MG) BY MOUTH DAILY Carlynn Purl, Danna Hefty, MD Taking Active               Assessment/Plan:   Diabetes: - Have reviewed goal A1c, goal fasting, and goal 2 hour post prandial glucose - Reviewed dietary modifications including importance of having well-balanced meals while controlling carbohydrate portion sizes - Recommend to continue to check glucose, keep log of results and have this record to review during upcoming medical appointments - Counsel on technique for improving comfort with blood sugar  monitoring - Collaborating with PCP and [facility] CPhT for aid to patient with applying for patient assistance for Xigduo through [employer]  - Patient states will follow up with pharmacy to pick up 30 day supplies of Xigduo XR prescription  Hypertension: - Reviewed long term cardiovascular and renal outcomes of uncontrolled blood pressure - Reviewed appropriate blood pressure monitoring technique and reviewed goal blood pressure. Recommended to check home blood pressure and heart rate, keep log of results and have this record to review during upcoming medical appointments  Follow Up Plan: Clinical Pharmacist will follow up with patient by telephone again on 01/12/2023 at 2:30 pm    Estelle Grumbles, PharmD, [facility] [phone]

## 2022-12-22 NOTE — Patient Instructions (Signed)
Goals Addressed             This Visit's Progress    Pharmacy Goals       Our goal A1c is less than 7%. This corresponds with fasting sugars less than 130 and 2 hour after meal sugars less than 180. Please keep a log of your results when checking your blood sugar   Our goal bad cholesterol, or LDL, is less than 70 . This is why it is important to continue taking your rosuvastatin.  Check your blood pressure once- twice weekly, and any time you have concerning symptoms like headache, chest pain, dizziness, shortness of breath, or vision changes.   Our goal is less than 130/80.  To appropriately check your blood pressure, make sure you do the following:  1) Avoid caffeine, exercise, or tobacco products for 30 minutes before checking. Empty your bladder. 2) Sit with your back supported in a flat-backed chair. Rest your arm on something flat (arm of the chair, table, etc). 3) Sit still with your feet flat on the floor, resting, for at least 5 minutes.  4) Check your blood pressure. Take 1-2 readings.  5) Write down these readings and bring with you to any provider appointments.  Bring your home blood pressure machine with you to a provider's office for accuracy comparison at least once a year.   Make sure you take your blood pressure medications before you come to any office visit, even if you were asked to fast for labs.  Jamisen Hawes Briceyda Abdullah, PharmD, BCACP [facility] [phone]         

## 2022-12-23 NOTE — Telephone Encounter (Signed)
Received notification from [employer] regarding approval for XIGDUO. Patient assistance approved from 12/23/2022 to 08/09/2023.  Phone: [phone]  [place]a Bora Rx Patient Advocate [phone]) [phone]   STILL WAITING ON [employer] FOR OZEMPIC.

## 2022-12-23 NOTE — Telephone Encounter (Signed)
I have RECEIVED both Pt and Provider pages and Submitted application for XIGDUO to [employer] for patient assistance.   Phone: [phone]  [place]a Bora Rx Patient Advocate [phone]) [phone]

## 2022-12-29 NOTE — Telephone Encounter (Signed)
Received notification from [employer] regarding approval for OZEMPIC. Patient assistance approved from 12/08/2022 to 08/09/2023.  Phone: [phone]  Keiondra Brookover L. CPhT Rx Patient Advocate (O) [phone] (F) [phone]  

## 2023-01-07 ENCOUNTER — Other Ambulatory Visit (HOSPITAL_COMMUNITY): Payer: Self-pay

## 2023-01-07 ENCOUNTER — Other Ambulatory Visit: Payer: PRIVATE HEALTH INSURANCE | Admitting: Pharmacist

## 2023-01-07 ENCOUNTER — Telehealth: Payer: Self-pay

## 2023-01-07 NOTE — Telephone Encounter (Signed)
-----   Message from [name], RPH-CPP sent at 01/07/2023  1:11 PM EDT ----- Vida Roller!  Would you be willing to help Ms. Ramsay with one more PAP application? This one is for Enbrel from [employer]. Prescriber is Tracey Harries, MD with [facility].  Thank you!  Gentry Fitz

## 2023-01-07 NOTE — Progress Notes (Signed)
01/07/2023 Name: Maria Cruz MRN: [id number] DOB: 10-Jun-1956  Chief Complaint  Patient presents with   Medication Assistance    Maria Cruz is a 66 y.o. year old female who presented for a telephone visit.   They were referred to the pharmacist by their PCP for assistance in managing medication access.    Subjective:   Care Team: Primary Care Provider: Alba Cory, MD ; Next Scheduled Visit: 02/18/2023 Neurologist: Maria Lemmings, MD Rheumatologist: Maria Harries, MD; Next Scheduled Visit: 01/13/2023 Nephrologist: Maria Cruz, AGNP-C; Next Scheduled Visit: 02/21/2023    Medication Access/Adherence  Current Pharmacy:  [facility] - [place], [place] - One [facility] AT Portal to Registered [address] One [place] [address] Phone: [phone] Fax: [phone]  [facility] [address] ([address]), [place] - [address] [place] ([address]) [place] [address] Phone: [phone] Fax: [phone]  [facility][id number] [place], [place] - [address] AT [facility] [address] [place] [address] [place] [address] Phone: [phone] Fax: [phone]  [id number] [place], [place] - [address] [place] [address] Phone: [phone] Fax: [phone]   Patient reports affordability concerns with their medications: Yes Patient reports access/transportation concerns to their pharmacy: No  Patient reports adherence concerns with their medications:  No    Today reports her Rheumatologist has switched her to Enbrel (as was unable to afford Cimzia), but has been unable to start this medication as cost of Enbrel per specialty pharmacy is unaffordable - Based on reported income, patient meets financial criteria for assistance for Enbrel from [employer] patient assistance program   Diabetes:   Current medications:  - Xigduo ER 05-999 mg daily -  Ozempic 1 mg weekly on Thursdays (Reports restarted on 5/2)             Reports tolerating well   Reports last checked her blood sugar last week, reading ~170   Statin: rosuvastatin 40 mg daily   Current medication access support: none - Patient denied for Extra Help through Social Security - Patient APPROVED for patient assistance for [employer] from [employer] through 08/09/2023             Reports picked up supply from assistance program from office last week - Patient APPROVED for patient assistance for Xigduo through [employer] through 08/09/2023             Today patient reports received 3 month supply shipment from assistance program in mail yesterday        Objective:  Lab Results  Component Value Date   HGBA1C 7.3 (A) 10/15/2022    Lab Results  Component Value Date   CREATININE 0.57 10/15/2022   BUN 13 10/15/2022   NA 141 10/15/2022   K 4.1 10/15/2022   CL 102 10/15/2022   CO2 22 10/15/2022    Lab Results  Component Value Date   CHOL 145 10/15/2022   HDL 60 10/15/2022   LDLCALC 56 10/15/2022   TRIG 177 (H) 10/15/2022   CHOLHDL 2.4 10/15/2022    Medications Reviewed Today     Reviewed by Maria Cruz, RPH-CPP (Pharmacist) on 01/07/23 at 1306  Med List Status: <None>   Medication Order Taking? Sig Documenting Provider Last Dose Status Informant  acetaminophen (TYLENOL) 500 MG tablet [id number]  Take 500 mg by mouth daily. [provider]  Active  allopurinol (ZYLOPRIM) 300 MG tablet [id number]  TAKE 1 TABLET(300 MG) BY MOUTH TWICE DAILY Cruz, Maria Hefty, MD  Active   amLODipine-olmesartan (AZOR) 5-40 MG tablet [id number]  Take 1 tablet by mouth daily. Maria Cory, MD  Active   Ascorbic Acid (VITAMIN C) POWD [id number]  Take 1,000 mg by mouth daily as needed. [provider]  Active   aspirin 81 MG chewable tablet [id number]  Chew 81 mg by mouth daily. [provider]  Active Self  baclofen (LIORESAL) 10 MG tablet [id number]  Take  1 tablet (10 mg total) by mouth at bedtime.  Patient taking differently: Take 10 mg by mouth at bedtime. As needed   Maria Cory, MD  Active   blood glucose meter kit and supplies [id number]  Dispense based on patient and insurance preference. Use up to four times daily as directed. (FOR ICD-10 E10.9, E11.9). Check fsbs up to twice daily Maria Cory, MD  Active   Certolizumab Pegol 2 X 200 MG/ML KIT [id number] No Inject 1 each into the skin every 30 (thirty) days. Last dose:07/24/22.  Next dose:08/31/22  Patient not taking: Reported on 01/07/2023   Maria Hammersmith, MD Not Taking Active Self  Cholecalciferol 25 MCG (1000 UT) tablet [id number]  Take 1,000 Units by mouth in the morning and at bedtime. [provider]  Active Self  Coenzyme Q10 (COQ10) 100 MG CAPS [id number]  Take 100 mg by mouth 2 (two) times daily. [provider]  Active   Dapagliflozin Pro-metFORMIN ER (XIGDUO XR) 05-999 MG TB24 [id number] Yes Take 1 tablet by mouth daily. Maria Cory, MD Taking Active   diclofenac Sodium (VOLTAREN) 1 % GEL [id number]  Apply 2 g topically 4 (four) times daily. Maria Cory, MD  Active   fluconazole (DIFLUCAN) 150 MG tablet [id number]  TAKE 1 TABLET BY MOUTH TWICE WEEKLY AND AS NEEDED Carlynn Purl, Maria Hefty, MD  Active   fluticasone (FLONASE) 50 MCG/ACT nasal spray [id number]  SHAKE LIQUID AND USE 2 SPRAYS IN EACH NOSTRIL DAILY Cruz, Maria Hefty, MD  Active   hydroxychloroquine (PLAQUENIL) 200 MG tablet [id number]  Take 200 mg by mouth 2 (two) times daily. Maria Hammersmith, MD  Active Self  Lancets [facility] DELICA PLUS [place]) MISC [id number]  TEST UP TO FOUR TIMES DAILY Maria Cory, MD  Active   levocetirizine (XYZAL) 5 MG tablet [id number]  TAKE 1 TABLET(5 MG) BY MOUTH EVERY Mena Goes, Maria Hefty, MD  Active   montelukast (SINGULAIR) 10 MG tablet [id number]  Take 1 tablet (10 mg total) by mouth at bedtime. Maria Cory, MD  Active   Multiple Vitamin (MULTIVITAMIN WITH  MINERALS) TABS tablet [id number]  Take 1 tablet by mouth daily. [provider]  Active Self  omega-3 acid ethyl esters (LOVAZA) 1 g capsule [id number]  Take 2 capsules (2 g total) by mouth 2 (two) times daily. Maria Cory, MD  Active   [facility] ULTRA test strip [id number]  TEST UP TO FOUR TIMES DAILY Maria Cory, MD  Active   [facility], 1 MG/DOSE, 4 MG/3ML Namon Cirri [id number] Yes INJECT 1 MG UNDER THE SKIN ONCE A Franky Macho, MD Taking Active            Med Note Ronney Asters, [facility] A   Wed Oct 20, 2022  9:34 AM) Thursdays  polyethylene glycol powder (GLYCOLAX/MIRALAX) 17 GM/SCOOP powder [id number]  Take 17 g by mouth daily as needed for mild constipation or moderate constipation. Maria Cory, MD  Active   pregabalin (LYRICA) 75 MG capsule [id number]  TAKE  1 CAPSULE BY MOUTH ONCE DAILY IN THE MORNING AND 2 CAPSULES IN THE Mena Goes, Maria Hefty, MD  Active   rosuvastatin (CRESTOR) 40 MG tablet [id number]  TAKE 1 TABLET(40 MG) BY MOUTH DAILY Carlynn Purl, Maria Hefty, MD  Active               Assessment/Plan:   Will collaborate with [facility] CPh[facility] for aid to patient with applying for patient assistance for Enbrel from [employer]   Diabetes: - Have reviewed goal A1c, goal fasting, and goal 2 hour post prandial glucose - Reviewed dietary modifications including importance of having well-balanced meals while controlling carbohydrate portion sizes - Recommend to continue to check glucose, keep log of results and have this record to review during upcoming medical appointments       Follow Up Plan: Clinical Pharmacist will follow up with patient by telephone again within the next 30 days   Estelle Grumbles, PharmD, [facility] [phone]

## 2023-01-07 NOTE — Patient Instructions (Signed)
Goals Addressed             This Visit's Progress    Pharmacy Goals       Our goal A1c is less than 7%. This corresponds with fasting sugars less than 130 and 2 hour after meal sugars less than 180. Please keep a log of your results when checking your blood sugar   Our goal bad cholesterol, or LDL, is less than 70 . This is why it is important to continue taking your rosuvastatin.  Check your blood pressure once- twice weekly, and any time you have concerning symptoms like headache, chest pain, dizziness, shortness of breath, or vision changes.   Our goal is less than 130/80.  To appropriately check your blood pressure, make sure you do the following:  1) Avoid caffeine, exercise, or tobacco products for 30 minutes before checking. Empty your bladder. 2) Sit with your back supported in a flat-backed chair. Rest your arm on something flat (arm of the chair, table, etc). 3) Sit still with your feet flat on the floor, resting, for at least 5 minutes.  4) Check your blood pressure. Take 1-2 readings.  5) Write down these readings and bring with you to any provider appointments.  Bring your home blood pressure machine with you to a provider's office for accuracy comparison at least once a year.   Make sure you take your blood pressure medications before you come to any office visit, even if you were asked to fast for labs.  Please watch the mail for an envelope from [facility] containing the patient assistance program applications for Enbrel. Please complete the application and bring to [facility] office to have it faxed back to Attention: Linward Foster at Fax # [phone]  Estelle Grumbles, PharmD, [facility] [phone]

## 2023-01-11 NOTE — Telephone Encounter (Signed)
PAP application for (ENBREL) [employer]  has been mailed to pt home. I will fax PCP pages once I receive pt pages.  [place]a Bora Rx Patient Advocate [phone]

## 2023-01-12 ENCOUNTER — Other Ambulatory Visit: Payer: PRIVATE HEALTH INSURANCE | Admitting: Pharmacist

## 2023-01-13 DIAGNOSIS — M159 Polyosteoarthritis, unspecified: Secondary | ICD-10-CM | POA: Diagnosis not present

## 2023-01-13 DIAGNOSIS — Z79899 Other long term (current) drug therapy: Secondary | ICD-10-CM | POA: Diagnosis not present

## 2023-01-13 DIAGNOSIS — M0609 Rheumatoid arthritis without rheumatoid factor, multiple sites: Secondary | ICD-10-CM | POA: Diagnosis not present

## 2023-01-25 NOTE — Telephone Encounter (Signed)
I HAVE  Received PT PAGES FOR (ENBREL) [employer].   I HAVE FAXED PROVIDER PAGES TO DR. Glenna Durand [facility] OFFICE.  PLEASE BE ADVISED

## 2023-01-29 ENCOUNTER — Other Ambulatory Visit: Payer: Self-pay | Admitting: Family Medicine

## 2023-01-29 DIAGNOSIS — E1169 Type 2 diabetes mellitus with other specified complication: Secondary | ICD-10-CM

## 2023-02-02 ENCOUNTER — Other Ambulatory Visit: Payer: PRIVATE HEALTH INSURANCE | Admitting: Pharmacist

## 2023-02-02 NOTE — Patient Instructions (Signed)
Goals Addressed             This Visit's Progress    Pharmacy Goals       Our goal A1c is less than 7%. This corresponds with fasting sugars less than 130 and 2 hour after meal sugars less than 180. Please keep a log of your results when checking your blood sugar   Our goal bad cholesterol, or LDL, is less than 70 . This is why it is important to continue taking your rosuvastatin.  Check your blood pressure once- twice weekly, and any time you have concerning symptoms like headache, chest pain, dizziness, shortness of breath, or vision changes.   Our goal is less than 130/80.  To appropriately check your blood pressure, make sure you do the following:  1) Avoid caffeine, exercise, or tobacco products for 30 minutes before checking. Empty your bladder. 2) Sit with your back supported in a flat-backed chair. Rest your arm on something flat (arm of the chair, table, etc). 3) Sit still with your feet flat on the floor, resting, for at least 5 minutes.  4) Check your blood pressure. Take 1-2 readings.  5) Write down these readings and bring with you to any provider appointments.  Bring your home blood pressure machine with you to a provider's office for accuracy comparison at least once a year.   Make sure you take your blood pressure medications before you come to any office visit, even if you were asked to fast for labs.  Saliou Barnier Eisley Barber, PharmD, BCACP [facility] [phone]         

## 2023-02-02 NOTE — Progress Notes (Signed)
02/02/2023 Name: Maria Cruz MRN: [id number] DOB: 1956/02/25  Chief Complaint  Patient presents with   Medication Assistance    Maria Cruz is a 67 y.o. year old female who presented for a telephone visit.   They were referred to the pharmacist by their PCP for assistance in managing medication access.    Subjective:   Care Team: Primary Care Provider: Alba Cory, MD ; Next Scheduled Visit: 02/18/2023 Neurologist: Alphonzo Lemmings, MD; Next Scheduled Visit: 05/20/2023 Rheumatologist: Tracey Harries, MD Nephrologist: Lamont Dowdy, MD; Next Scheduled Visit: 02/16/2023    Medication Access/Adherence  Current Pharmacy:  [facility] - [place], [place] - One [facility] AT Portal to Registered [address] One [place] [address] Phone: [phone] Fax: [phone]  [facility] [address] ([address]), [place] - [address] [place] ([address]) [place] [address] Phone: [phone] Fax: [phone]  [facility] #[id number] [name], [place] - [address] AT [facility] OF [address] [place] [address] [place] [address] Phone: [phone] Fax: [phone]  [id number] [place],  - [address] [place] [address] Phone: [phone] Fax: [phone]   Patient reports affordability concerns with their medications: Yes Patient reports access/transportation concerns to their pharmacy: No  Patient reports adherence concerns with their medications:  No     Followed up with [facility] CPhT regarding patient assistance application for Enbrel from [employer] - Reports received patient portion of application back on 6/18 - CPhT faxed provider portion to Rheumatology office on 6/18 and again on 6/24 - Today reports has not yet received provider portion back, but will outreach to Rheumatology office by telephone to follow up   Diabetes:   Current  medications:  - Xigduo ER 05-999 mg daily - Ozempic 1 mg weekly  Unable to review home blood sugar results today as patient not currently home   Statin: rosuvastatin 40 mg daily   Current medication access support: none - Patient denied for Extra Help through Social Security - Patient APPROVED for patient assistance for Ozempic from [employer] through 08/09/2023 - Patient APPROVED for patient assistance for Xigduo through [employer] through 08/09/2023           Objective:  Lab Results  Component Value Date   HGBA1C 7.3 (A) 10/15/2022    Lab Results  Component Value Date   CREATININE 0.57 10/15/2022   BUN 13 10/15/2022   NA 141 10/15/2022   K 4.1 10/15/2022   CL 102 10/15/2022   CO2 22 10/15/2022    Lab Results  Component Value Date   CHOL 145 10/15/2022   HDL 60 10/15/2022   LDLCALC 56 10/15/2022   TRIG 177 (H) 10/15/2022   CHOLHDL 2.4 10/15/2022    Medications Reviewed Today     Reviewed by Maria Cruz, Maria Cruz (Pharmacist) on 01/07/23 at 1306  Med List Status: <None>   Medication Order Taking? Sig Documenting Provider Last Dose Status Informant  acetaminophen (TYLENOL) 500 MG tablet [id number]  Take 500 mg by mouth daily. [provider]  Active   allopurinol (ZYLOPRIM) 300 MG tablet [id number]  TAKE 1 TABLET(300 MG) BY MOUTH TWICE DAILY Sowles, Danna Hefty, MD  Active   amLODipine-olmesartan (AZOR) 5-40 MG tablet [id number]  Take 1 tablet by mouth daily. Alba Cory, MD  Active   Ascorbic Acid (VITAMIN C) POWD [id number]  Take 1,000 mg by mouth daily  as needed. [provider]  Active   aspirin 81 MG chewable tablet [id number]  Chew 81 mg by mouth daily. [provider]  Active Self  baclofen (LIORESAL) 10 MG tablet [id number]  Take 1 tablet (10 mg total) by mouth at bedtime.  Patient taking differently: Take 10 mg by mouth at bedtime. As needed   Alba Cory, MD  Active   blood glucose meter kit and supplies [id number]  Dispense  based on patient and insurance preference. Use up to four times daily as directed. (FOR ICD-10 E10.9, E11.9). Check fsbs up to twice daily Alba Cory, MD  Active   Certolizumab Pegol 2 X 200 MG/ML KIT [id number] No Inject 1 each into the skin every 30 (thirty) days. Last dose:07/24/22.  Next dose:08/31/22  Patient not taking: Reported on 01/07/2023   Patterson Hammersmith, MD Not Taking Active Self  Cholecalciferol 25 MCG (1000 UT) tablet [id number]  Take 1,000 Units by mouth in the morning and at bedtime. [provider]  Active Self  Coenzyme Q10 (COQ10) 100 MG CAPS [id number]  Take 100 mg by mouth 2 (two) times daily. [provider]  Active   Dapagliflozin Pro-metFORMIN ER (XIGDUO XR) 05-999 MG TB24 [id number] Yes Take 1 tablet by mouth daily. Alba Cory, MD Taking Active   diclofenac Sodium (VOLTAREN) 1 % GEL [id number]  Apply 2 g topically 4 (four) times daily. Alba Cory, MD  Active   fluconazole (DIFLUCAN) 150 MG tablet [id number]  TAKE 1 TABLET BY MOUTH TWICE WEEKLY AND AS NEEDED Carlynn Purl, Danna Hefty, MD  Active   fluticasone (FLONASE) 50 MCG/ACT nasal spray [id number]  SHAKE LIQUID AND USE 2 SPRAYS IN EACH NOSTRIL DAILY Sowles, Danna Hefty, MD  Active   hydroxychloroquine (PLAQUENIL) 200 MG tablet [id number]  Take 200 mg by mouth 2 (two) times daily. Patterson Hammersmith, MD  Active Self  Lancets [facility] DELICA PLUS [place]) MISC [id number]  TEST UP TO FOUR TIMES DAILY Alba Cory, MD  Active   levocetirizine (XYZAL) 5 MG tablet [id number]  TAKE 1 TABLET(5 MG) BY MOUTH EVERY Mena Goes, Danna Hefty, MD  Active   montelukast (SINGULAIR) 10 MG tablet [id number]  Take 1 tablet (10 mg total) by mouth at bedtime. Alba Cory, MD  Active   Multiple Vitamin (MULTIVITAMIN WITH MINERALS) TABS tablet [id number]  Take 1 tablet by mouth daily. [provider]  Active Self  omega-3 acid ethyl esters (LOVAZA) 1 g capsule [id number]  Take 2 capsules (2 g total) by mouth 2 (two) times  daily. Alba Cory, MD  Active   [facility] ULTRA test strip [id number]  TEST UP TO FOUR TIMES DAILY Alba Cory, MD  Active   [facility], 1 MG/DOSE, 4 MG/3ML Namon Cirri [id number] Yes INJECT 1 MG UNDER THE SKIN ONCE A Franky Macho, MD Taking Active            Med Note Ronney Asters, [facility] A   Wed Oct 20, 2022  9:34 AM) Thursdays  polyethylene glycol powder (GLYCOLAX/MIRALAX) 17 GM/SCOOP powder [id number]  Take 17 g by mouth daily as needed for mild constipation or moderate constipation. Alba Cory, MD  Active   pregabalin (LYRICA) 75 MG capsule [id number]  TAKE 1 CAPSULE BY MOUTH ONCE DAILY IN THE MORNING AND 2 CAPSULES IN THE Mena Goes, Danna Hefty, MD  Active   rosuvastatin (CRESTOR) 40 MG tablet [id number]  TAKE 1 TABLET(40 MG) BY MOUTH DAILY Alba Cory, MD  Active  Assessment/Plan:   Will continue to collaborate with [facility] CPhT for aid to patient with applying for patient assistance for Enbrel from [employer] - [facility] CPhT plans to outreach to Rheumatology office by telephone to follow up regarding provider portion - Patient states that she will also follow up with Rheumatology office regarding this paperwork   Diabetes: - Have reviewed goal A1c, goal fasting, and goal 2 hour post prandial glucose - Have reviewed dietary modifications including importance of having well-balanced meals while controlling carbohydrate portion sizes - Recommend to continue to check glucose, keep log of results and have this record to review during upcoming medical appointments         Follow Up Plan: Clinical Pharmacist will follow up with patient by telephone again next month   Estelle Grumbles, PharmD, [facility] [phone]

## 2023-02-04 ENCOUNTER — Other Ambulatory Visit: Payer: Self-pay | Admitting: Family Medicine

## 2023-02-04 DIAGNOSIS — M109 Gout, unspecified: Secondary | ICD-10-CM

## 2023-02-04 DIAGNOSIS — J3089 Other allergic rhinitis: Secondary | ICD-10-CM

## 2023-02-04 DIAGNOSIS — B3731 Acute candidiasis of vulva and vagina: Secondary | ICD-10-CM

## 2023-02-04 DIAGNOSIS — E559 Vitamin D deficiency, unspecified: Secondary | ICD-10-CM

## 2023-02-08 NOTE — Telephone Encounter (Signed)
Received all pages and Submitted application for ENBREL to [facility] for patient assistance for PROCESSING.  Please be advised     Phone: [phone]

## 2023-02-11 NOTE — Telephone Encounter (Signed)
pt called to check on her application process for Enbrel([employer]) and was a little confused. I did discuss with that I had submitted her application on 02/08/2023 and that it would take 2-5 business days.  I called the company as well to make sure that they had everything and they assured me that they had what they needed to process application.

## 2023-02-16 DIAGNOSIS — E1129 Type 2 diabetes mellitus with other diabetic kidney complication: Secondary | ICD-10-CM | POA: Diagnosis not present

## 2023-02-16 DIAGNOSIS — I10 Essential (primary) hypertension: Secondary | ICD-10-CM | POA: Diagnosis not present

## 2023-02-16 DIAGNOSIS — R809 Proteinuria, unspecified: Secondary | ICD-10-CM | POA: Diagnosis not present

## 2023-02-17 NOTE — Progress Notes (Signed)
Name: Maria Cruz   MRN: [id number]    DOB: [birth year]   Date:02/18/2023       Progress Note  Subjective  Chief Complaint  Follow Up  HPI  DM II with proteinuria: urine micro has been elevated since 2019, under the care of Dr. Ronn Melena since 2021. She is on  ARB/CCB, Xigduo, Ozempic 1mg    She has dyslipidemia and takes Rosuvastatin and Lovaza, she also has microalbuminuria and is on ARB and also seeing nephrologist . She denies polyphagia, polydipsia and polyuria. A1C was 6.5 % , went up to 7.2 % after that it was  8.3 %  down to 7.3 % but now spiked to 9 %, she states she has been craving sweets again and not as active. She gets Ozempic through the assistance program and we will try to change dose to 2 mg and continue Xigduo   Hyperlipidemia: Taking Lovaza and Rosuvastatin and LDL , last LDL was at  goal  at 56  continue medications. Denies myopathy .    HTN: She is on AZOR for now ( Norvasc plus Benicar ) Denies chest pain, palpitation. She has DM also and proteinuria at 422 , under the care of nephrologist , last GFR was normal. She has some orthostatic changes intermittently . Advised to stay hydrated and get up slowly    Gout:  taking Allopurinol daily, denies side effects. She has been compliant with medication . No recent episodes  Last level was at goal    History of  left hip replacement on 12/05/2019 by Dr. Odis Luster, she completed PT , she states she is feeling better She takes lyrica for ulnar pain and also joint pains, we will increase to 4 capsules daily   Right ulnar nerve release: doing better, able to extend all fingers , she stopped going to PT do to cost   NW:GNFAOZHYQ in 2021, seeing Dr. Allena Katz, could not take methotrexate because of elevation of liver enzymes,  she has been taking plaquenil but unable to afford  Cetrolizumab injections - they are trying to get her assistance . Still has stiffness and joint aches but stable with medications   Patient Active Problem List    Diagnosis Date Noted   History of decompression of ulnar nerve 06/04/2022   Dyslipidemia associated with type 2 diabetes mellitus (HCC) 06/04/2022   Cubital tunnel syndrome, right 07/16/2020   Meralgia paraesthetica, left 07/16/2020   Ulnar neuropathy at elbow, right 07/16/2020   Numbness and tingling 06/10/2020   Encounter for long-term (current) use of high-risk medication 06/02/2020   LFT elevation 06/02/2020   Osteoarthritis of spine with radiculopathy, cervical region 06/02/2020   Primary osteoarthritis involving multiple joints 06/02/2020   Rheumatoid arthritis of multiple sites with negative rheumatoid factor (HCC) 06/02/2020   Hip pain 03/27/2020   Osteoarthritis 03/27/2020   Sinus tachycardia 03/27/2020   History of total hip arthroplasty 12/18/2019   Proteinuria, unspecified 10/10/2019   Microcalcification of left breast on mammogram 05/03/2017   Herniated intervertebral disc of lumbar spine 09/02/2016   Spondylolisthesis of lumbar region 09/02/2016   Type 2 diabetes mellitus with albuminuria (HCC) 02/24/2015   Vitamin D deficiency 02/24/2015   Perennial allergic rhinitis 02/24/2015   Hypercalcemia 02/24/2015   GERD without esophagitis 02/24/2015   Hypertension, benign 02/24/2015   Controlled gout 02/24/2015   Fatty liver 02/24/2015   Callus of foot 02/24/2015   Primary osteoarthritis of right hip 02/24/2015   History of carpal tunnel syndrome 02/24/2015   Obesity (BMI  30-39.9) 02/24/2015   Umbilical hernia without obstruction or gangrene 02/24/2015   Intermittent low back pain 02/24/2015   History of colonic polyps 02/24/2015   Dyslipidemia 02/24/2015    Past Surgical History:  Procedure Laterality Date   ABDOMINAL HYSTERECTOMY  2012   ANTERIOR INTEROSSEOUS NERVE DECOMPRESSION Right 12/16/2020   Procedure: Right ulnar nerve release, elbow;  Surgeon: Kennedy Bucker, MD;  Location: [facility];  Service: Orthopedics;  Laterality: Right;   ANTERIOR INTEROSSEOUS NERVE  DECOMPRESSION Right 08/24/2022   Procedure: Repeat ulnar nerve release at elbow;  Surgeon: Kennedy Bucker, MD;  Location: [facility];  Service: Orthopedics;  Laterality: Right;  Diabetic   APPENDECTOMY  2012   BREAST BIOPSY Right 07/12/2012   Negative   BREAST BIOPSY Left 05/09/2017   Affirm Bx of two areas-FIBROADENOMA WITH COARSE CALCIFICATION. NEGATIVE FOR ATYPIA,COLLAPSED CYST AND FIBROADENOMATOID CHANGE   COLONOSCOPY  2009   Dr Servando Snare   COLONOSCOPY WITH PROPOFOL N/A 07/27/2017   Procedure: COLONOSCOPY WITH PROPOFOL;  Surgeon: Earline Mayotte, MD;  Location: [facility];  Service: Endoscopy;  Laterality: N/A;   DILATION AND CURETTAGE OF UTERUS  2007   REPLACEMENT TOTAL HIP W/  RESURFACING IMPLANTS Left 11/2019   TUBAL LIGATION      Family History  Problem Relation Age of Onset   Multiple sclerosis Mother    Heart attack Father    Kidney failure Sister    Heart attack Brother    Diabetes Daughter    CAD Sister    Arthritis Sister        RA   Lupus Sister    Breast cancer Neg Hx    Colon cancer Neg Hx     Social History   Tobacco Use   Smoking status: Never   Smokeless tobacco: Never  Substance Use Topics   Alcohol use: No    Alcohol/week: 0.0 standard drinks of alcohol     Current Outpatient Medications:    acetaminophen (TYLENOL) 500 MG tablet, Take 500 mg by mouth daily., Disp: , Rfl:    allopurinol (ZYLOPRIM) 300 MG tablet, TAKE 1 TABLET(300 MG) BY MOUTH TWICE DAILY, Disp: 180 tablet, Rfl: 1   amLODipine-olmesartan (AZOR) 5-40 MG tablet, Take 1 tablet by mouth daily., Disp: 90 tablet, Rfl: 1   Ascorbic Acid (VITAMIN C) POWD, Take 1,000 mg by mouth daily as needed., Disp: , Rfl:    aspirin 81 MG chewable tablet, Chew 81 mg by mouth daily., Disp: , Rfl:    baclofen (LIORESAL) 10 MG tablet, Take 1 tablet (10 mg total) by mouth at bedtime. (Patient taking differently: Take 10 mg by mouth at bedtime. As needed), Disp: 90 tablet, Rfl: 1   blood glucose  meter kit and supplies, Dispense based on patient and insurance preference. Use up to four times daily as directed. (FOR ICD-10 E10.9, E11.9). Check fsbs up to twice daily, Disp: 1 each, Rfl: 0   Cholecalciferol 25 MCG (1000 UT) tablet, Take 1,000 Units by mouth in the morning and at bedtime., Disp: , Rfl:    Coenzyme Q10 (COQ10) 100 MG CAPS, Take 100 mg by mouth 2 (two) times daily., Disp: , Rfl:    Dapagliflozin Pro-metFORMIN ER (XIGDUO XR) 05-999 MG TB24, Take 1 tablet by mouth daily., Disp: 90 tablet, Rfl: 0   diclofenac Sodium (VOLTAREN) 1 % GEL, Apply 2 g topically 4 (four) times daily., Disp: 100 g, Rfl: 2   fluconazole (DIFLUCAN) 150 MG tablet, TAKE 1 TABLET BY MOUTH TWICE WEEKLY AND AS  NEEDED, Disp: 24 tablet, Rfl: 0   fluticasone (FLONASE) 50 MCG/ACT nasal spray, SHAKE LIQUID AND USE 2 SPRAYS IN EACH NOSTRIL DAILY, Disp: 48 mL, Rfl: 0   hydroxychloroquine (PLAQUENIL) 200 MG tablet, Take 200 mg by mouth 2 (two) times daily., Disp: , Rfl:    Lancets (FREESTYLE) lancets, , Disp: , Rfl:    Lancets (ONETOUCH DELICA PLUS LANCET30G) MISC, TEST UP TO FOUR TIMES DAILY, Disp: 100 each, Rfl: 11   levocetirizine (XYZAL) 5 MG tablet, TAKE 1 TABLET(5 MG) BY MOUTH EVERY EVENING, Disp: 90 tablet, Rfl: 1   montelukast (SINGULAIR) 10 MG tablet, Take 1 tablet (10 mg total) by mouth at bedtime., Disp: 90 tablet, Rfl: 1   Multiple Vitamin (MULTIVITAMIN WITH MINERALS) TABS tablet, Take 1 tablet by mouth daily., Disp: , Rfl:    omega-3 acid ethyl esters (LOVAZA) 1 g capsule, TAKE 2 CAPSULES BY MOUTH 2 TIMES DAILY., Disp: 360 capsule, Rfl: 0   ONETOUCH ULTRA test strip, TEST UP TO FOUR TIMES DAILY, Disp: 300 strip, Rfl: 1   OZEMPIC, 1 MG/DOSE, 4 MG/3ML SOPN, INJECT 1 MG UNDER THE SKIN ONCE A WEEK, Disp: 9 mL, Rfl: 1   polyethylene glycol powder (GLYCOLAX/MIRALAX) 17 GM/SCOOP powder, Take 17 g by mouth daily as needed for mild constipation or moderate constipation., Disp: 850 g, Rfl: 0   pregabalin (LYRICA) 75 MG  capsule, TAKE 1 CAPSULE BY MOUTH ONCE DAILY IN THE MORNING AND 2 CAPSULES IN THE EVENING, Disp: 270 capsule, Rfl: 0   rosuvastatin (CRESTOR) 40 MG tablet, TAKE 1 TABLET(40 MG) BY MOUTH DAILY, Disp: 90 tablet, Rfl: 1   Certolizumab Pegol 2 X 200 MG/ML KIT, Inject 1 each into the skin every 30 (thirty) days. Last dose:07/24/22.  Next dose:08/31/22 (Patient not taking: Reported on 01/07/2023), Disp: , Rfl:   Allergies  Allergen Reactions   Ace Inhibitors Cough   Lipofen [Fenofibrate] Other (See Comments)    Localized drug reaction, blister on back   Penicillins Itching   Sulfa Antibiotics Itching    I personally reviewed active problem list, medication list, allergies, family history, social history, health maintenance with the patient/caregiver today.   ROS  Ten systems reviewed and is negative except as mentioned in HPI    Objective  Vitals:   02/18/23 0957  BP: 114/82  Pulse: 97  Resp: 16  Temp: 97.8 F (36.6 C)  TempSrc: Oral  SpO2: 100%  Weight: 222 lb 6.4 oz (100.9 kg)  Height: 5\' 7"  (1.702 m)    Body mass index is 34.83 kg/m.  Physical Exam  Constitutional: Patient appears well-developed and well-nourished. Obese  No distress.  HEENT: head atraumatic, normocephalic, pupils equal and reactive to light, neck supple Cardiovascular: Normal rate, regular rhythm and normal heart sounds.  No murmur heard. No BLE edema. Pulmonary/Chest: Effort normal and breath sounds normal. No respiratory distress. Abdominal: Soft.  There is no tenderness. Psychiatric: Patient has a normal mood and affect. behavior is normal. Judgment and thought content normal.   Recent Results (from the past 2160 hour(s))  POCT HgB A1C     Status: Abnormal   Collection Time: 02/18/23 10:01 AM  Result Value Ref Range   Hemoglobin A1C 9.0 (A) 4.0 - 5.6 %   HbA1c POC (<> result, manual entry)     HbA1c, POC (prediabetic range)     HbA1c, POC (controlled diabetic range)      Diabetic Foot  Exam: Diabetic Foot Exam - Simple   No data filed  PHQ2/9:    02/18/2023   10:00 AM 10/15/2022   10:37 AM 08/13/2022   10:29 AM 06/04/2022    8:37 AM 12/03/2021    9:29 AM  Depression screen PHQ 2/9  Decreased Interest 0 0 0 0 0  Down, Depressed, Hopeless 0 0 0 0 0  PHQ - 2 Score 0 0 0 0 0  Altered sleeping 0 0 0 0 0  Tired, decreased energy 0 0 0 0 0  Change in appetite 0 0 0 0 0  Feeling bad or failure about yourself  0 0 0 0 0  Trouble concentrating 0 0 0 0 0  Moving slowly or fidgety/restless 0 0 0 0 0  Suicidal thoughts 0 0 0 0 0  PHQ-9 Score 0 0 0 0 0  Difficult doing work/chores    Not difficult at all     phq 9 is negative   Fall Risk:    02/18/2023   10:00 AM 10/15/2022   10:36 AM 08/13/2022   10:29 AM 06/04/2022    8:27 AM 12/03/2021    9:29 AM  Fall Risk   Falls in the past year? 0 0 0 0 0  Number falls in past yr:  0 0 0   Injury with Fall?  0 0 0   Risk for fall due to : No Fall Risks No Fall Risks No Fall Risks  No Fall Risks  Follow up Falls prevention discussed Falls prevention discussed Falls prevention discussed  Falls prevention discussed      Functional Status Survey: Is the patient deaf or have difficulty hearing?: No Does the patient have difficulty seeing, even when wearing glasses/contacts?: No Does the patient have difficulty concentrating, remembering, or making decisions?: No Does the patient have difficulty walking or climbing stairs?: No Does the patient have difficulty dressing or bathing?: No Does the patient have difficulty doing errands alone such as visiting a doctor's office or shopping?: No    Assessment & Plan  1. Type 2 diabetes mellitus with albuminuria (HCC)  - POCT HgB A1C - HM Diabetes Foot Exam - amLODipine-olmesartan (AZOR) 5-40 MG tablet; Take 1 tablet by mouth daily.  Dispense: 90 tablet; Refill: 1 - Semaglutide, 2 MG/DOSE, 8 MG/3ML SOPN; Inject 2 mg as directed once a week.  Dispense: 9 mL; Refill: 1  2.  Dyslipidemia associated with type 2 diabetes mellitus (HCC)  - Semaglutide, 2 MG/DOSE, 8 MG/3ML SOPN; Inject 2 mg as directed once a week.  Dispense: 9 mL; Refill: 1  3. Rheumatoid arthritis of multiple sites with negative rheumatoid factor (HCC)  Waiting to get approved for medication  4. Vitamin D deficiency  - baclofen (LIORESAL) 10 MG tablet; Take 1 tablet (10 mg total) by mouth at bedtime. As needed  Dispense: 90 tablet; Refill: 1  5. Controlled gout   6. Primary osteoarthritis involving multiple joints  - pregabalin (LYRICA) 75 MG capsule; Take 1-2 capsules (75-150 mg total) by mouth 3 (three) times daily. One cap in am, one in the pm and two at night  Dispense: 360 capsule; Refill: 1  7. Hypertension, benign  - amLODipine-olmesartan (AZOR) 5-40 MG tablet; Take 1 tablet by mouth daily.  Dispense: 90 tablet; Refill: 1  8. History of decompression of ulnar nerve  - pregabalin (LYRICA) 75 MG capsule; Take 1-2 capsules (75-150 mg total) by mouth 3 (three) times daily. One cap in am, one in the pm and two at night  Dispense: 360 capsule; Refill: 1

## 2023-02-18 ENCOUNTER — Ambulatory Visit (INDEPENDENT_AMBULATORY_CARE_PROVIDER_SITE_OTHER): Payer: Medicare HMO | Admitting: Family Medicine

## 2023-02-18 ENCOUNTER — Encounter: Payer: Self-pay | Admitting: Family Medicine

## 2023-02-18 VITALS — BP 114/82 | HR 97 | Temp 97.8°F | Resp 16 | Ht 67.0 in | Wt 222.4 lb

## 2023-02-18 DIAGNOSIS — I10 Essential (primary) hypertension: Secondary | ICD-10-CM

## 2023-02-18 DIAGNOSIS — M109 Gout, unspecified: Secondary | ICD-10-CM | POA: Diagnosis not present

## 2023-02-18 DIAGNOSIS — R809 Proteinuria, unspecified: Secondary | ICD-10-CM

## 2023-02-18 DIAGNOSIS — M159 Polyosteoarthritis, unspecified: Secondary | ICD-10-CM | POA: Diagnosis not present

## 2023-02-18 DIAGNOSIS — E559 Vitamin D deficiency, unspecified: Secondary | ICD-10-CM | POA: Diagnosis not present

## 2023-02-18 DIAGNOSIS — M0609 Rheumatoid arthritis without rheumatoid factor, multiple sites: Secondary | ICD-10-CM | POA: Diagnosis not present

## 2023-02-18 DIAGNOSIS — E1129 Type 2 diabetes mellitus with other diabetic kidney complication: Secondary | ICD-10-CM | POA: Diagnosis not present

## 2023-02-18 DIAGNOSIS — E785 Hyperlipidemia, unspecified: Secondary | ICD-10-CM

## 2023-02-18 DIAGNOSIS — Z9889 Other specified postprocedural states: Secondary | ICD-10-CM

## 2023-02-18 DIAGNOSIS — E1169 Type 2 diabetes mellitus with other specified complication: Secondary | ICD-10-CM | POA: Diagnosis not present

## 2023-02-18 LAB — POCT GLYCOSYLATED HEMOGLOBIN (HGB A1C): Hemoglobin A1C: 9 % — AB (ref 4.0–5.6)

## 2023-02-18 MED ORDER — SEMAGLUTIDE (2 MG/DOSE) 8 MG/3ML ~~LOC~~ SOPN: 2.0000 mg | PEN_INJECTOR | SUBCUTANEOUS | 1 refills | Status: DC

## 2023-02-18 MED ORDER — PREGABALIN 75 MG PO CAPS: 75.0000 mg | ORAL_CAPSULE | Freq: Three times a day (TID) | ORAL | 1 refills | Status: DC

## 2023-02-18 MED ORDER — AMLODIPINE-OLMESARTAN 5-40 MG PO TABS: 1.0000 | ORAL_TABLET | Freq: Every day | ORAL | 1 refills | Status: DC

## 2023-02-18 MED ORDER — BACLOFEN 10 MG PO TABS: 10.0000 mg | ORAL_TABLET | Freq: Every day | ORAL | 1 refills | Status: DC

## 2023-02-23 ENCOUNTER — Encounter: Payer: Self-pay | Admitting: Family Medicine

## 2023-02-24 NOTE — Telephone Encounter (Signed)
RECEIVED A REFILL REQUEST FROM [employer]  FOR XIGDUO  TO BE SENT TO  [facility] I HAVE ADDED TO PREFERRED PHARMACY LIST IN PT CHART   PLEASE BE ADVISED   LETTER HAS BEEN SCANNED INTO MEDIA OF CHART.  AND A REFILL PROVIDER FORM AS WELL. PROVIDER CAN FILL THAT OUT AND SEND BACK TO COMPANY OR SEND THROUGH [facility].

## 2023-03-09 ENCOUNTER — Other Ambulatory Visit: Payer: PRIVATE HEALTH INSURANCE | Admitting: Pharmacist

## 2023-03-09 NOTE — Telephone Encounter (Signed)
Received notification from [facility]  regarding patient assistance DECISION PENDING for ENBREL DUE TO PATIENT NEEDED A LIS. I WILL RESUBMIT WHEN WE GET THE LETTER.  PLEASE BE ADVISED  I did just speak to her and she stated that she was denied, but has not yet received that letter. She said she would try and get her daughter to go on the site to see if she can get the denial letter off of the site or she would go up there to see if the could give her copy of it. I told her that I would resubmit at that time with the letter she has 90 days to get the letter to [employer] Thanks!

## 2023-03-09 NOTE — Progress Notes (Unsigned)
03/09/2023 Name: Maria Cruz MRN: [id number] DOB: 1956-06-05  Chief Complaint  Patient presents with   Medication Management   Medication Assistance    Maria Cruz is a 67 y.o. year old female who presented for a telephone visit.   They were referred to the pharmacist by their PCP for assistance in managing medication access.    Subjective:   Care Team: Primary Care Provider: Alba Cory, MD ; Next Scheduled Visit: 06/24/2023 Neurologist: Alphonzo Lemmings, MD; Next Scheduled Visit: 05/20/2023 Rheumatologist: Tracey Harries, MD Nephrologist: Lamont Dowdy, MD  Medication Access/Adherence  Current Pharmacy:  [facility] - [place], [place] - One [facility] AT Portal to Registered [address] [place] [address] Phone: [phone] Fax: [phone]  [facility] [address] ([address]), [place] - [address] [place] ([place]) [place] [address] Phone: [phone] Fax: [phone]  [id number] [name], [place] - [address] AT [facility] OF [address] [place] [address] [place] [address] Phone: [phone] Fax: [phone]  [id number] [place], [place] - [address] [place] [address] Phone: [phone] Fax: [phone]  [facility] - [place], [place] - [address] [place] [address] Phone: [phone] Fax: [phone]   Patient reports affordability concerns with their medications: Yes  Patient reports access/transportation concerns to their pharmacy: No  Patient reports adherence concerns with their medications:  No    Uses weekly pillbox  Diabetes:   Current medications:  - Xigduo ER 05-999 mg daily - Ozempic 1 mg weekly Plans to increase dose to Ozempic 2 mg weekly, as directed by PCP, once receives new dose from assistance program   Reports home blood sugar this week after  lunch:, reading: 173  Current Dietary Patterns: - Breakfast: [employer] McMuffin and coffee with cream and Splenda - Lunch: Salad with lettuce, tomato and cucumber - Supper: Salad and chicken or tuna - Snacks: Apples, oranges, cherries and plums  - Drinks: water and occasionally tea with Splenda   Statin: rosuvastatin 40 mg daily  Current physical activity: walks ~30 minutes daily   Current medication access support: none - Patient denied for Extra Help through Social Security - Patient APPROVED for patient assistance for [employer] from [employer] through 08/09/2023 Collaborated with [employer]. New Rx for Ozempic 2 mg in processing as of 7/17 and expected to ship to office ~10-14 days from processing date - Patient APPROVED for patient assistance for Xigduo through [employer] through 08/09/2023          Hypertension:  Current medications: amlodipine-olmesartan 5-40 mg daily  Medications previously tried:   Denies checking home blood pressure recently  Patient denies hypotensive s/sx including dizziness, lightheadedness.     Objective:  Lab Results  Component Value Date   HGBA1C 9.0 (A) 02/18/2023    Lab Results  Component Value Date   CREATININE 0.57 10/15/2022   BUN 13 10/15/2022   NA 141 10/15/2022   K 4.1 10/15/2022   CL 102 10/15/2022   CO2 22 10/15/2022    Lab Results  Component Value Date   CHOL 145 10/15/2022   HDL 60 10/15/2022   LDLCALC 56 10/15/2022   TRIG 177 (H) 10/15/2022   CHOLHDL 2.4 10/15/2022   BP Readings from Last 3 Encounters:  02/18/23 114/82  10/15/22 126/70  08/24/22 121/75  Pulse Readings from Last 3 Encounters:  02/18/23 97  10/15/22 96  08/24/22 80     Medications Reviewed Today     Reviewed by [name], RPH-CPP (Pharmacist) on 03/09/23 at 1416  Med List Status: <None>   Medication Order Taking? Sig Documenting Provider Last Dose Status Informant  acetaminophen (TYLENOL) 500 MG tablet [id number]  Take 500 mg by  mouth daily. [provider]  Active   allopurinol (ZYLOPRIM) 300 MG tablet [id number]  TAKE 1 TABLET(300 MG) BY MOUTH TWICE DAILY Sowles, Danna Hefty, MD  Active   amLODipine-olmesartan (AZOR) 5-40 MG tablet [id number]  Take 1 tablet by mouth daily. Alba Cory, MD  Active   Ascorbic Acid (VITAMIN C) POWD [id number]  Take 1,000 mg by mouth daily as needed. [provider]  Active   aspirin 81 MG chewable tablet [id number]  Chew 81 mg by mouth daily. [provider]  Active Self  baclofen (LIORESAL) 10 MG tablet [id number]  Take 1 tablet (10 mg total) by mouth at bedtime. As needed Alba Cory, MD  Active   blood glucose meter kit and supplies [id number]  Dispense based on patient and insurance preference. Use up to four times daily as directed. (FOR ICD-10 E10.9, E11.9). Check fsbs up to twice daily Alba Cory, MD  Active   Certolizumab Pegol 2 X 200 MG/ML KIT [id number]  Inject 1 each into the skin every 30 (thirty) days. Last dose:07/24/22.  Next dose:08/31/22  Patient not taking: Reported on 01/07/2023   Patterson Hammersmith, MD  Active Self  Cholecalciferol 25 MCG (1000 UT) tablet [id number]  Take 1,000 Units by mouth in the morning and at bedtime. [provider]  Active Self  Coenzyme Q10 (COQ10) 100 MG CAPS [id number]  Take 100 mg by mouth 2 (two) times daily. [provider]  Active   Dapagliflozin Pro-metFORMIN ER (XIGDUO XR) 05-999 MG TB24 [id number] Yes Take 1 tablet by mouth daily. Alba Cory, MD Taking Active   diclofenac Sodium (VOLTAREN) 1 % GEL [id number]  Apply 2 g topically 4 (four) times daily. Alba Cory, MD  Active   fluconazole (DIFLUCAN) 150 MG tablet [id number]  TAKE 1 TABLET BY MOUTH TWICE WEEKLY AND AS NEEDED Carlynn Purl, Danna Hefty, MD  Active   fluticasone (FLONASE) 50 MCG/ACT nasal spray [id number]  SHAKE LIQUID AND USE 2 SPRAYS IN EACH NOSTRIL DAILY Sowles, Danna Hefty, MD  Active   hydroxychloroquine (PLAQUENIL) 200 MG tablet [id number]   Take 200 mg by mouth 2 (two) times daily. Patterson Hammersmith, MD  Active Self  Lancets (FREESTYLE) lancets [id number]   [provider]  Active   Lancets [facility] K[facility] PLUS [place]) MISC [id number]  TEST UP TO FOUR TIMES DAILY Alba Cory, MD  Active   levocetirizine (XYZAL) 5 MG tablet [id number]  TAKE 1 TABLET(5 MG) BY MOUTH EVERY Mena Goes, Danna Hefty, MD  Active   montelukast (SINGULAIR) 10 MG tablet [id number]  Take 1 tablet (10 mg total) by mouth at bedtime. Alba Cory, MD  Active   Multiple Vitamin (MULTIVITAMIN WITH MINERALS) TABS tablet [id number]  Take 1 tablet by mouth daily. [provider]  Active Self  omega-3 acid ethyl esters (LOVAZA) 1 g capsule [id number]  TAKE 2 CAPSULES BY MOUTH 2 TIMES DAILY. Alba Cory, MD  Active   [facility] ULTRA test strip [id number]  TEST UP TO FOUR TIMES DAILY Alba Cory, MD  Active   polyethylene glycol powder (GLYCOLAX/MIRALAX) 17 GM/SCOOP powder [id number]  Take 17 g by mouth daily as needed  for mild constipation or moderate constipation. Alba Cory, MD  Active   pregabalin (LYRICA) 75 MG capsule [id number]  Take 1-2 capsules (75-150 mg total) by mouth 3 (three) times daily. One cap in am, one in the pm and two at night Alba Cory, MD  Active   rosuvastatin (CRESTOR) 40 MG tablet [id number] Yes TAKE 1 TABLET(40 MG) BY MOUTH DAILY Alba Cory, MD Taking Active   Semaglutide, 2 MG/DOSE, 8 MG/3ML SOPN [id number]  Inject 2 mg as directed once a week. Alba Cory, MD  Active               Assessment/Plan:   Collaborating with [facility] CPh[facility] for aid to patient with applying for patient assistance for Enbrel from [employer] - Follow up with CPhT today regarding status of application. Joni Reining states that she will follow up with patient to request that she send a copy of her Extra Help denial letter so that this can be submitted to [employer]    Diabetes: - Have reviewed goal A1c, goal fasting, and goal 2 hour post  prandial glucose - Reviewed dietary modifications including importance of having well-balanced meals and snacks while controlling carbohydrate portion sizes - Patient plans to increase dose to Ozempic 2 mg weekly as directed by PCP once receives new dose from assistance program - Recommend to continue to check glucose, keep log of results and have this record to review during upcoming medical appointments  Hypertension: - Reviewed long term cardiovascular and renal outcomes of uncontrolled blood pressure - Reviewed appropriate blood pressure monitoring technique and reviewed goal blood pressure. Recommended to check home blood pressure and heart rate, keep log of results and have this record to review during upcoming medical appointments  Follow Up Plan: Clnical Pharmacist will follow up with patient by telephone on 04/13/2023 at 3 pm  Estelle Grumbles, PharmD, [facility] [phone]

## 2023-03-11 ENCOUNTER — Other Ambulatory Visit: Payer: Self-pay | Admitting: Family Medicine

## 2023-03-11 DIAGNOSIS — M150 Primary generalized (osteo)arthritis: Secondary | ICD-10-CM

## 2023-03-11 DIAGNOSIS — E1169 Type 2 diabetes mellitus with other specified complication: Secondary | ICD-10-CM

## 2023-03-11 DIAGNOSIS — J3089 Other allergic rhinitis: Secondary | ICD-10-CM

## 2023-03-11 DIAGNOSIS — M109 Gout, unspecified: Secondary | ICD-10-CM

## 2023-03-11 DIAGNOSIS — M159 Polyosteoarthritis, unspecified: Secondary | ICD-10-CM

## 2023-03-11 DIAGNOSIS — Z9889 Other specified postprocedural states: Secondary | ICD-10-CM

## 2023-03-13 NOTE — Patient Instructions (Signed)
Goals Addressed             This Visit's Progress    Pharmacy Goals       Our goal A1c is less than 7%. This corresponds with fasting sugars less than 130 and 2 hour after meal sugars less than 180. Please keep a log of your results when checking your blood sugar   Our goal bad cholesterol, or LDL, is less than 70 . This is why it is important to continue taking your rosuvastatin.  Check your blood pressure once- twice weekly, and any time you have concerning symptoms like headache, chest pain, dizziness, shortness of breath, or vision changes.   Our goal is less than 130/80.  To appropriately check your blood pressure, make sure you do the following:  1) Avoid caffeine, exercise, or tobacco products for 30 minutes before checking. Empty your bladder. 2) Sit with your back supported in a flat-backed chair. Rest your arm on something flat (arm of the chair, table, etc). 3) Sit still with your feet flat on the floor, resting, for at least 5 minutes.  4) Check your blood pressure. Take 1-2 readings.  5) Write down these readings and bring with you to any provider appointments.  Bring your home blood pressure machine with you to a provider's office for accuracy comparison at least once a year.   Make sure you take your blood pressure medications before you come to any office visit, even if you were asked to fast for labs.  Wallace Cullens, PharmD, [facility] [phone]

## 2023-03-14 ENCOUNTER — Other Ambulatory Visit: Payer: Self-pay | Admitting: Family Medicine

## 2023-03-14 MED ORDER — DAPAGLIFLOZIN PRO-METFORMIN ER 10-1000 MG PO TB24: 1.0000 | ORAL_TABLET | Freq: Every day | ORAL | 3 refills | Status: DC

## 2023-04-13 ENCOUNTER — Other Ambulatory Visit: Payer: PRIVATE HEALTH INSURANCE | Admitting: Pharmacist

## 2023-04-13 DIAGNOSIS — E1129 Type 2 diabetes mellitus with other diabetic kidney complication: Secondary | ICD-10-CM

## 2023-04-13 MED ORDER — FREESTYLE LANCETS MISC: 3 refills | Status: AC

## 2023-04-13 MED ORDER — FREESTYLE LITE TEST VI STRP: ORAL_STRIP | 3 refills | Status: DC

## 2023-04-13 MED ORDER — FREESTYLE LITE W/DEVICE KIT: PACK | 0 refills | Status: AC

## 2023-04-13 NOTE — Patient Instructions (Signed)
Goals Addressed             This Visit's Progress    Pharmacy Goals       Our goal A1c is less than 7%. This corresponds with fasting sugars less than 130 and 2 hour after meal sugars less than 180. Please keep a log of your results when checking your blood sugar   Our goal bad cholesterol, or LDL, is less than 70 . This is why it is important to continue taking your rosuvastatin.  Check your blood pressure once- twice weekly, and any time you have concerning symptoms like headache, chest pain, dizziness, shortness of breath, or vision changes.   Our goal is less than 130/80.  To appropriately check your blood pressure, make sure you do the following:  1) Avoid caffeine, exercise, or tobacco products for 30 minutes before checking. Empty your bladder. 2) Sit with your back supported in a flat-backed chair. Rest your arm on something flat (arm of the chair, table, etc). 3) Sit still with your feet flat on the floor, resting, for at least 5 minutes.  4) Check your blood pressure. Take 1-2 readings.  5) Write down these readings and bring with you to any provider appointments.  Bring your home blood pressure machine with you to a provider's office for accuracy comparison at least once a year.   Make sure you take your blood pressure medications before you come to any office visit, even if you were asked to fast for labs.  Wallace Cullens, PharmD, [facility] [phone]

## 2023-04-13 NOTE — Progress Notes (Signed)
04/13/2023 Name: Maria Cruz MRN: [id number] DOB: 08/16/1955  Chief Complaint  Patient presents with   Medication Management   Medication Adherence   Medication Assistance    Maria Cruz is a 67 y.o. year old female who presented for a telephone visit.   They were referred to the pharmacist by their PCP for assistance in managing medication access.    Subjective:   Care Team: Primary Care Provider: Alba Cory, MD ; Next Scheduled Visit: 06/24/2023 Neurologist: Alphonzo Lemmings, MD; Next Scheduled Visit: 05/20/2023 Rheumatologist: Tracey Harries, MD Nephrologist: Lamont Dowdy, MD  Medication Access/Adherence  Current Pharmacy:  [facility] - [place], [place] - One [facility] AT Portal to Registered [address] One [place] [address] Phone: [phone] Fax: [phone]  [facility] [address] ([address]), [place] - [address] [place] (N) [place] [address] Phone: [phone] Fax: [phone]  [facility][id number] [name], [name] - [address] AT [facility] OF [address] [place] [address] [place] [address] Phone: [phone] Fax: [phone]  [id number] [place], [place] - [address] [place] [address] Phone: [phone] Fax: [phone]  [facility] - [place], [place] - [address] [place] [address] Phone: [phone] Fax: [phone]   Patient reports affordability concerns with their medications: Yes  Patient reports access/transportation concerns to their pharmacy: No  Patient reports adherence concerns with their medications:  No    Today reports that she sent a copy of her Extra Help denial letter to CPhT Linward Foster last week to be faxed to [employer] for her Enbrel patient assistance program application  Diabetes:   Current medications:  - Xigduo ER 05-999 mg daily - Ozempic  2 mg weekly - Reports increased to current dose a few weeks ago and tolerating well   Reports last checked home blood sugar last week after a meal, recalls reading: ~200   Attributes recent elevated blood sugar reading to eating habits. Reports recently has been craving/snacking on more sweets during the day  Statin: rosuvastatin 40 mg daily   Current physical activity: reports has not been walking recently, but plans to restart walking ~30 minutes daily   Current medication access support: none - Patient denied for Extra Help through Social Security - Patient APPROVED for patient assistance for Ozempic from [employer] through 08/09/2023 - Patient APPROVED for patient assistance for Xigduo through [employer] through 08/09/2023          Hypertension:   Current medications: amlodipine-olmesartan 5-40 mg daily   Recalls last checked home blood pressure last week, reading: 127/80   Patient denies hypotensive s/sx including dizziness, lightheadedness.    Objective:  Lab Results  Component Value Date   HGBA1C 9.0 (A) 02/18/2023    Lab Results  Component Value Date   CREATININE 0.57 10/15/2022   BUN 13 10/15/2022   NA 141 10/15/2022   K 4.1 10/15/2022   CL 102 10/15/2022   CO2 22 10/15/2022    Lab Results  Component Value Date   CHOL 145 10/15/2022   HDL 60 10/15/2022   LDLCALC 56 10/15/2022   TRIG 177 (H) 10/15/2022   CHOLHDL 2.4 10/15/2022   BP Readings from Last 3 Encounters:  02/18/23 114/82  10/15/22 126/70  08/24/22 121/75   Pulse Readings from Last 3 Encounters:  02/18/23 97  10/15/22 96  08/24/22 80     Medications Reviewed Today     Reviewed by [name], RPH-CPP (Pharmacist) on 04/13/23 at 2107  Med List Status: <None>   Medication Order Taking? Sig Documenting Provider Last Dose Status Informant  acetaminophen (TYLENOL) 500 MG tablet [id number]  Take 500 mg by mouth daily. [provider]  Active   allopurinol (ZYLOPRIM) 300 MG  tablet [id number]  TAKE 1 TABLET(300 MG) BY MOUTH TWICE DAILY Sowles, Danna Hefty, MD  Active   amLODipine-olmesartan (AZOR) 5-40 MG tablet [id number] Yes Take 1 tablet by mouth daily. Alba Cory, MD Taking Active   Ascorbic Acid (VITAMIN C) POWD [id number]  Take 1,000 mg by mouth daily as needed. [provider]  Active   aspirin 81 MG chewable tablet [id number]  Chew 81 mg by mouth daily. [provider]  Active Self  baclofen (LIORESAL) 10 MG tablet [id number]  Take 1 tablet (10 mg total) by mouth at bedtime. As needed Alba Cory, MD  Active   blood glucose meter kit and supplies [id number]  Dispense based on patient and insurance preference. Use up to four times daily as directed. (FOR ICD-10 E10.9, E11.9). Check fsbs up to twice daily Alba Cory, MD  Active   Certolizumab Pegol 2 X 200 MG/ML KIT [id number]  Inject 1 each into the skin every 30 (thirty) days. Last dose:07/24/22.  Next dose:08/31/22  Patient not taking: Reported on 01/07/2023   Patterson Hammersmith, MD  Active Self  Cholecalciferol 25 MCG (1000 UT) tablet [id number]  Take 1,000 Units by mouth in the morning and at bedtime. [provider]  Active Self  Coenzyme Q10 (COQ10) 100 MG CAPS [id number]  Take 100 mg by mouth 2 (two) times daily. [provider]  Active   Dapagliflozin Pro-metFORMIN ER (XIGDUO XR) 05-999 MG TB24 [id number] Yes Take 1 tablet by mouth daily. Alba Cory, MD Taking Active   diclofenac Sodium (VOLTAREN) 1 % GEL [id number]  Apply 2 g topically 4 (four) times daily. Alba Cory, MD  Active   fluconazole (DIFLUCAN) 150 MG tablet [id number]  TAKE 1 TABLET BY MOUTH TWICE WEEKLY AND AS NEEDED Carlynn Purl, Danna Hefty, MD  Active   fluticasone (FLONASE) 50 MCG/ACT nasal spray [id number]  SHAKE LIQUID AND USE 2 SPRAYS IN EACH NOSTRIL DAILY Sowles, Danna Hefty, MD  Active   hydroxychloroquine (PLAQUENIL) 200 MG tablet [id number]  Take 200 mg by mouth 2 (two) times daily. Patterson Hammersmith, MD  Active  Self  Lancets (FREESTYLE) lancets [id number]   [provider]  Active   Lancets [facility] [place] [facility] [place]) MISC [id number]  TEST UP TO FOUR TIMES DAILY Alba Cory, MD  Active   levocetirizine (XYZAL) 5 MG tablet [id number]  TAKE 1 TABLET BY MOUTH EVERY Mena Goes, Danna Hefty, MD  Active   montelukast (SINGULAIR) 10 MG tablet [id number]  Take 1 tablet (10 mg total) by mouth at bedtime. Alba Cory, MD  Active   Multiple Vitamin (MULTIVITAMIN WITH MINERALS) TABS tablet [id number]  Take 1 tablet by mouth daily. [provider]  Active Self  omega-3 acid ethyl esters (LOVAZA) 1 g capsule [id number]  TAKE 2 CAPSULES BY MOUTH TWICE A Mliss Fritz, MD  Active   [facility] ULTRA test strip [id number]  TEST UP TO FOUR TIMES DAILY Alba Cory, MD  Active   polyethylene glycol powder (GLYCOLAX/MIRALAX) 17 GM/SCOOP powder [id number]  Take 17 g by mouth daily as needed for mild constipation or moderate constipation. Alba Cory, MD  Active  pregabalin (LYRICA) 75 MG capsule [id number]  TAKE 1 CAPSULE BY MOUTH IN THE MORNING AND 2 CAPSULES IN THE Mena Goes, Danna Hefty, MD  Active   rosuvastatin (CRESTOR) 40 MG tablet [id number]  TAKE 1 TABLET BY MOUTH EVERY DAY Alba Cory, MD  Active   Semaglutide, 2 MG/DOSE, 8 MG/3ML SOPN [id number] Yes Inject 2 mg as directed once a week. Alba Cory, MD Taking Active               Assessment/Plan:   Collaborating with [facility] CPhT for aid to patient with applying for patient assistance for Enbrel from [employer] Patient states that she will follow up with CPhT next week regarding status of her [employer] patient assistance program application     Diabetes: - Have reviewed goal A1c, goal fasting, and goal 2 hour post prandial glucose - Reviewed dietary modifications including importance of having well-balanced meals and snacks while controlling carbohydrate portion sizes  Discuss balanced snack ideas - Encourage patient to  restart her daily walking routine - Recommend to continue to check glucose, keep log of results and have this record to review during upcoming medical appointments. Encourage patient to use blood sugar checks as feedback - Patient requesting new Freestyle Lite meter kit and supplies as reports preferred having this meter in the past. Per protocol, will send prescriptions for Freestyle Lite glucometer and testing supplies to pharmacy   Hypertension: - Reviewed long term cardiovascular and renal outcomes of uncontrolled blood pressure - Reviewed appropriate blood pressure monitoring technique and reviewed goal blood pressure. Recommended to check home blood pressure and heart rate, keep log of results and have this record to review during upcoming medical appointments   Follow Up Plan: Clnical Pharmacist will follow up with patient by telephone on 05/18/2023 at 1:30 PM    Estelle Grumbles, PharmD, [facility] [phone]

## 2023-04-14 ENCOUNTER — Other Ambulatory Visit: Payer: Self-pay | Admitting: Family Medicine

## 2023-04-14 DIAGNOSIS — E1129 Type 2 diabetes mellitus with other diabetic kidney complication: Secondary | ICD-10-CM

## 2023-04-20 NOTE — Telephone Encounter (Signed)
Pt sent a medical assistance denial letter instead of LIS denial letter will follow up with pt

## 2023-05-05 NOTE — Telephone Encounter (Signed)
PAP: Patient application pending due to LIS LETTER WILL REVISIT DURING RE-ENROLLMENT   PENDING/DENIAL DUE TO LIS  Melanee Spry CPhT Rx Patient Advocate [phone]   PLEASE BE ADVISED

## 2023-05-18 ENCOUNTER — Other Ambulatory Visit: Payer: PRIVATE HEALTH INSURANCE | Admitting: Pharmacist

## 2023-05-18 NOTE — Progress Notes (Signed)
05/18/2023 Name: Maria Cruz MRN: [id number] DOB: Feb 15, 1956  Chief Complaint  Patient presents with   Medication Management   Medication Adherence   Medication Assistance    Maria Cruz is a 67 y.o. year old female who presented for a telephone visit.   They were referred to the pharmacist by their PCP for assistance in managing medication access.    Subjective:   Care Team: Primary Care Provider: Alba Cory, MD ; Next Scheduled Visit: 06/24/2023 Neurologist: Alphonzo Lemmings, MD; Next Scheduled Visit: 05/20/2023 Rheumatologist: Tracey Harries, MD Nephrologist: Lamont Dowdy, MD  Medication Access/Adherence  Current Pharmacy:  [facility] - [place], [place] - One [facility] AT Portal to Registered [address] [place] [address] Phone: [phone] Fax: [phone]  [facility] [address] ([address]), [place] - [address] [place] ([place]) [place] [address] Phone: [phone] Fax: [phone]  [facility] #[id number] [name], [place] - [address] AT [facility] OF [address] [place] [address] [place] [address] Phone: [phone] Fax: [phone]  [id number] [place], [place] - [address] [place] [address] Phone: [phone] Fax: [phone]  [facility] - [place], [place] - [address] [place] [address] Phone: [phone] Fax: [phone]   Patient reports affordability concerns with their medications: Yes  Patient reports access/transportation concerns to their pharmacy: No  Patient reports adherence concerns with their medications:  No     Started using weekly pillbox; denies missed doses  Note CPhT Linward Foster attempted to help patient with applying for patient assistance from [employer] for her Enbrel for 2024 calendar year, but was unsuccessful as program needing copy of patient's  Extra Help denial letter. Planned to attempt to apply again for 2025.   Diabetes:   Current medications:  - Xigduo ER 05-999 mg daily - Ozempic 2 mg weekly - tolerating well; reports has noticed benefit in appetite control with this dose   Today patient states that she has not been checking her home blood sugar because has not yet picked up [employer] and supplies from last month - Note Freestyle not preferred glucometer through patient's plan, but stated preferred this meter for greater comfort and planned to purchase out of pocket if not covered - States will go to pharmacy to purchase meter and supplies this week   Reports has been working on making positive changes to improve dietary habits, including: - incorporating more non-starchy vegetables into her meals - avoiding fried foods - having more balanced meals and snacks   Statin: rosuvastatin 40 mg daily   Current physical activity: reports has not been walking recently, but plans to restart walking ~30 minutes daily   Current medication access support: - Patient denied for Extra Help through Social Security - Enrolled in patient assistance for [employer] from [employer] through 08/09/2023 - Enrolled in patient assistance for Xigduo through [employer] through 08/09/2023           Hypertension:   Current medications: amlodipine-olmesartan 5-40 mg daily    Denies checking home blood pressure recently   Patient denies hypotensive s/sx including dizziness, lightheadedness.        Objective:  Lab Results  Component Value Date   HGBA1C 9.0 (A) 02/18/2023    Lab Results  Component Value Date   CREATININE 0.57 10/15/2022  BUN 13 10/15/2022   NA 141 10/15/2022   K 4.1 10/15/2022   CL 102 10/15/2022   CO2 22 10/15/2022    Lab Results  Component Value Date   CHOL 145 10/15/2022   HDL 60 10/15/2022   LDLCALC 56 10/15/2022   TRIG 177 (H) 10/15/2022   CHOLHDL 2.4 10/15/2022   BP Readings from Last 3  Encounters:  02/18/23 114/82  10/15/22 126/70  08/24/22 121/75   Pulse Readings from Last 3 Encounters:  02/18/23 97  10/15/22 96  08/24/22 80     Medications Reviewed Today     Reviewed by [name], RPH-CPP (Pharmacist) on 05/18/23 at 1404  Med List Status: <None>   Medication Order Taking? Sig Documenting Provider Last Dose Status Informant  acetaminophen (TYLENOL) 500 MG tablet [id number]  Take 500 mg by mouth daily. [provider]  Active   allopurinol (ZYLOPRIM) 300 MG tablet [id number]  TAKE 1 TABLET(300 MG) BY MOUTH TWICE DAILY Sowles, Danna Hefty, MD  Active   amLODipine-olmesartan (AZOR) 5-40 MG tablet [id number]  Take 1 tablet by mouth daily. Alba Cory, MD  Active   Ascorbic Acid (VITAMIN C) POWD [id number]  Take 1,000 mg by mouth daily as needed. [provider]  Active   aspirin 81 MG chewable tablet [id number]  Chew 81 mg by mouth daily. [provider]  Active Self  baclofen (LIORESAL) 10 MG tablet [id number]  Take 1 tablet (10 mg total) by mouth at bedtime. As needed Alba Cory, MD  Active   blood glucose meter kit and supplies [id number]  Dispense based on patient and insurance preference. Use up to four times daily as directed. (FOR ICD-10 E10.9, E11.9). Check fsbs up to twice daily Alba Cory, MD  Active   Blood Glucose Monitoring Suppl (FREESTYLE LITE) w/Device KIT [id number]  Use as instructed to test blood sugar up to twice daily Alba Cory, MD  Active   Certolizumab Pegol 2 X 200 MG/ML KIT [id number]  Inject 1 each into the skin every 30 (thirty) days. Last dose:07/24/22.  Next dose:08/31/22  Patient not taking: Reported on 01/07/2023   Patterson Hammersmith, MD  Active Self  Cholecalciferol 25 MCG (1000 UT) tablet [id number]  Take 1,000 Units by mouth in the morning and at bedtime. [provider]  Active Self  Coenzyme Q10 (COQ10) 100 MG CAPS [id number]  Take 100 mg by mouth 2 (two) times daily. [provider]  Active   Dapagliflozin Pro-metFORMIN ER (XIGDUO XR) 05-999 MG TB24 [id number] Yes Take 1 tablet by mouth daily. Alba Cory, MD Taking Active   diclofenac Sodium (VOLTAREN) 1 % GEL [id number]  Apply 2 g topically 4 (four) times daily. Alba Cory, MD  Active   fluconazole (DIFLUCAN) 150 MG tablet [id number]  TAKE 1 TABLET BY MOUTH TWICE WEEKLY AND AS NEEDED Carlynn Purl, Danna Hefty, MD  Active   fluticasone (FLONASE) 50 MCG/ACT nasal spray [id number]  SHAKE LIQUID AND USE 2 SPRAYS IN EACH NOSTRIL DAILY Sowles, Danna Hefty, MD  Active   glucose blood (FREESTYLE LITE) test strip [id number]  USE AS INSTRUCTED TO TEST BLOOD SUGAR UP TO TWICE DAILY Alba Cory, MD  Active   hydroxychloroquine (PLAQUENIL) 200 MG tablet [id number]  Take 200 mg by mouth 2 (two) times daily. Patterson Hammersmith, MD  Active Self  Lancets (FREESTYLE) lancets [id number]  Use as instructed to test blood sugar up to twice daily Alba Cory, MD  Active   levocetirizine (XYZAL) 5 MG tablet [id number]  TAKE 1  TABLET BY MOUTH EVERY Mena Goes, Danna Hefty, MD  Active   montelukast (SINGULAIR) 10 MG tablet [id number]  Take 1 tablet (10 mg total) by mouth at bedtime. Alba Cory, MD  Active   Multiple Vitamin (MULTIVITAMIN WITH MINERALS) TABS tablet [id number]  Take 1 tablet by mouth daily. [provider]  Active Self  omega-3 acid ethyl esters (LOVAZA) 1 g capsule [id number]  TAKE 2 CAPSULES BY MOUTH TWICE A DAY Sowles, Danna Hefty, MD  Active   polyethylene glycol powder (GLYCOLAX/MIRALAX) 17 GM/SCOOP powder [id number]  Take 17 g by mouth daily as needed for mild constipation or moderate constipation. Alba Cory, MD  Active   pregabalin (LYRICA) 75 MG capsule [id number]  TAKE 1 CAPSULE BY MOUTH IN THE MORNING AND 2 CAPSULES IN THE Mena Goes, Danna Hefty, MD  Active   rosuvastatin (CRESTOR) 40 MG tablet [id number]  TAKE 1 TABLET BY MOUTH EVERY DAY Alba Cory, MD  Active   Semaglutide, 2 MG/DOSE, 8 MG/3ML SOPN [id number] Yes  Inject 2 mg as directed once a week. Alba Cory, MD Taking Active               Assessment/Plan:   Will collaborate again with [facility] CPhT for aid to patient with applying for patient assistance for Enbrel from [employer] now for 2025 calendar year Patient states that she mailed Extra Help denial letter back to CPhT Linward Foster on 10/7   Encourage patient to contact Seniors? Health Insurance Information Program [facility]) for her questions about selecting a Medicare plan for next calendar year    Diabetes: - Have reviewed goal A1c, goal fasting, and goal 2 hour post prandial glucose - Encourage patient to continue dietary modifications including importance of having well-balanced meals and snacks while controlling carbohydrate portion sizes - Encourage patient to restart her daily walking routine - Patient to pick up Freestyle Lite meter kit and supplies from pharmacy and restart checking glucose, keep log of results and have this record to review during upcoming medical appointments. Encourage patient to use blood sugar checks as feedback - Will collaborate with CPhT and PCP to aid patient with renewal for Ozempic patient assistance from [employer] - Patient to use [employer]Me Writer link in her email to start re-enrollment process for [place] patient assistance   Hypertension: - Reviewed long term cardiovascular and renal outcomes of uncontrolled blood pressure - Reviewed appropriate blood pressure monitoring technique and reviewed goal blood pressure. Recommended to restart checking home blood pressure and heart rate, keep log of results and have this record to review during upcoming medical appointments   Follow Up Plan: Clnical Pharmacist will follow up with patient by telephone on 06/29/2023 at 3 pm   Estelle Grumbles, PharmD, [facility] [phone]

## 2023-05-18 NOTE — Patient Instructions (Signed)
Goals Addressed             This Visit's Progress    Pharmacy Goals       Our goal A1c is less than 7%. This corresponds with fasting sugars less than 130 and 2 hour after meal sugars less than 180. Please keep a log of your results when checking your blood sugar   Our goal bad cholesterol, or LDL, is less than 70 . This is why it is important to continue taking your rosuvastatin.  You can reach the [facility] Counselor for [facility] by calling:  Toma Deiters [facility] [address]     [place]  [address] [phone]     Ask for help with [facility]   Check your blood pressure once- twice weekly, and any time you have concerning symptoms like headache, chest pain, dizziness, shortness of breath, or vision changes.   Our goal is less than 130/80.  To appropriately check your blood pressure, make sure you do the following:  1) Avoid caffeine, exercise, or tobacco products for 30 minutes before checking. Empty your bladder. 2) Sit with your back supported in a flat-backed chair. Rest your arm on something flat (arm of the chair, table, etc). 3) Sit still with your feet flat on the floor, resting, for at least 5 minutes.  4) Check your blood pressure. Take 1-2 readings.  5) Write down these readings and bring with you to any provider appointments.  Bring your home blood pressure machine with you to a provider's office for accuracy comparison at least once a year.   Make sure you take your blood pressure medications before you come to any office visit, even if you were asked to fast for labs.   Estelle Grumbles, PharmD, [facility] [phone]

## 2023-05-20 DIAGNOSIS — M25552 Pain in left hip: Secondary | ICD-10-CM | POA: Diagnosis not present

## 2023-05-20 DIAGNOSIS — G5622 Lesion of ulnar nerve, left upper limb: Secondary | ICD-10-CM | POA: Diagnosis not present

## 2023-05-20 DIAGNOSIS — G5621 Lesion of ulnar nerve, right upper limb: Secondary | ICD-10-CM | POA: Diagnosis not present

## 2023-05-26 DIAGNOSIS — M150 Primary generalized (osteo)arthritis: Secondary | ICD-10-CM | POA: Diagnosis not present

## 2023-05-26 DIAGNOSIS — Z79899 Other long term (current) drug therapy: Secondary | ICD-10-CM | POA: Diagnosis not present

## 2023-05-26 DIAGNOSIS — M0609 Rheumatoid arthritis without rheumatoid factor, multiple sites: Secondary | ICD-10-CM | POA: Diagnosis not present

## 2023-05-27 DIAGNOSIS — M0609 Rheumatoid arthritis without rheumatoid factor, multiple sites: Secondary | ICD-10-CM | POA: Diagnosis not present

## 2023-05-27 DIAGNOSIS — Z79899 Other long term (current) drug therapy: Secondary | ICD-10-CM | POA: Diagnosis not present

## 2023-06-01 ENCOUNTER — Other Ambulatory Visit: Payer: Self-pay | Admitting: Family Medicine

## 2023-06-01 DIAGNOSIS — J3089 Other allergic rhinitis: Secondary | ICD-10-CM

## 2023-06-22 NOTE — Telephone Encounter (Signed)
PAP: Application for ENBREL has been submitted to PAP Companies: [employer], via fax   PLEASE BE ADVISED RE FAXING TO COMPANY DUE TO PT SENT IN LIS DENIAL LETTER SEE IF WE CAN GET THIS COVERED.

## 2023-06-23 NOTE — Progress Notes (Signed)
Name: Maria Cruz   MRN: [id number]    DOB: [birth year]   Date:06/24/2023       Progress Note  Subjective  Chief Complaint  Follow Up  HPI  DM II with proteinuria/obesity/HTN: urine micro has been elevated since 2019, under the care of Dr. Ronn Melena since 2021. She is on  ARB/CCB, Xigduo, Ozempic 2 mg    She has dyslipidemia and takes Rosuvastatin and Lovaza.  She denies polyphagia, polydipsia and polyuria. A1C was up to 9 % Summer 2024 we adjusted dose of Ozempic to 2 mg  and she states she is feeling better now, appetite has decreased , more compliant with her diet but A1C is still not at goal. Discussed adding Actos and she is willing to try it    Hyperlipidemia: Taking Lovaza and Rosuvastatin and LDL , last LDL was at  goal  at 56  continue medications. Denies myopathy . We will recheck labs yearly     HTN: She is on AZOR for now ( Norvasc plus Benicar ) Denies chest pain, palpitation. She has DM also and proteinuria at 422 , under the care of nephrologist , last GFR was normal. No recent episode of dizziness.    Gout:  taking Allopurinol daily, denies side effects. She has been compliant with medication . No recent episodes  Last uric acid level was at goal    History of  left hip replacement on 12/05/2019 by Dr. Odis Luster, she completed PT , she states she is feeling better She takes lyrica for ulnar pain and also joint pains, we will increase to 4 capsules daily - one am, one lunch and two in the pm   Right ulnar nerve release: doing better, able to extend all fingers , she stopped going to PT due to cost but she states she has been doing some home exercises and symptoms are controlled now    RA: diagnosed in 2021, seeing Dr. Allena Katz, could not take methotrexate because of elevation of liver enzymes,  she has been taking plaquenil but unable to afford  Cetrolizumab injections - they are trying to get her assistance . Still has stiffness and joint aches , she states having more pain due to  cold weather  Rhinorrhea: she states no fever or chills. She has been using nasal spray and montelukast , she is out of xyzal and may be the reason of increase in symptoms   Patient Active Problem List   Diagnosis Date Noted   History of decompression of ulnar nerve 06/04/2022   Dyslipidemia associated with type 2 diabetes mellitus (HCC) 06/04/2022   Cubital tunnel syndrome, right 07/16/2020   Meralgia paraesthetica, left 07/16/2020   Ulnar neuropathy at elbow, right 07/16/2020   Numbness and tingling 06/10/2020   Encounter for long-term (current) use of high-risk medication 06/02/2020   LFT elevation 06/02/2020   Osteoarthritis of spine with radiculopathy, cervical region 06/02/2020   Primary osteoarthritis involving multiple joints 06/02/2020   Rheumatoid arthritis of multiple sites with negative rheumatoid factor (HCC) 06/02/2020   Hip pain 03/27/2020   Osteoarthritis 03/27/2020   Sinus tachycardia 03/27/2020   History of total hip arthroplasty 12/18/2019   Proteinuria, unspecified 10/10/2019   Microcalcification of left breast on mammogram 05/03/2017   Herniated intervertebral disc of lumbar spine 09/02/2016   Spondylolisthesis of lumbar region 09/02/2016   Type 2 diabetes mellitus with albuminuria (HCC) 02/24/2015   Vitamin D deficiency 02/24/2015   Perennial allergic rhinitis 02/24/2015   Hypercalcemia 02/24/2015   GERD  without esophagitis 02/24/2015   Hypertension, benign 02/24/2015   Controlled gout 02/24/2015   Fatty liver 02/24/2015   Callus of foot 02/24/2015   Primary osteoarthritis of right hip 02/24/2015   History of carpal tunnel syndrome 02/24/2015   Obesity (BMI 30-39.9) 02/24/2015   Umbilical hernia without obstruction or gangrene 02/24/2015   Intermittent low back pain 02/24/2015   History of colonic polyps 02/24/2015   Dyslipidemia 02/24/2015    Past Surgical History:  Procedure Laterality Date   ABDOMINAL HYSTERECTOMY  2012   ANTERIOR INTEROSSEOUS  NERVE DECOMPRESSION Right 12/16/2020   Procedure: Right ulnar nerve release, elbow;  Surgeon: Kennedy Bucker, MD;  Location: [facility];  Service: Orthopedics;  Laterality: Right;   ANTERIOR INTEROSSEOUS NERVE DECOMPRESSION Right 08/24/2022   Procedure: Repeat ulnar nerve release at elbow;  Surgeon: Kennedy Bucker, MD;  Location: [facility];  Service: Orthopedics;  Laterality: Right;  Diabetic   APPENDECTOMY  2012   BREAST BIOPSY Right 07/12/2012   Negative   BREAST BIOPSY Left 05/09/2017   Affirm Bx of two areas-FIBROADENOMA WITH COARSE CALCIFICATION. NEGATIVE FOR ATYPIA,COLLAPSED CYST AND FIBROADENOMATOID CHANGE   COLONOSCOPY  2009   Dr Servando Snare   COLONOSCOPY WITH PROPOFOL N/A 07/27/2017   Procedure: COLONOSCOPY WITH PROPOFOL;  Surgeon: Earline Mayotte, MD;  Location: [facility];  Service: Endoscopy;  Laterality: N/A;   DILATION AND CURETTAGE OF UTERUS  2007   REPLACEMENT TOTAL HIP W/  RESURFACING IMPLANTS Left 11/2019   TUBAL LIGATION      Family History  Problem Relation Age of Onset   Multiple sclerosis Mother    Heart attack Father    Kidney failure Sister    Heart attack Brother    Diabetes Daughter    CAD Sister    Arthritis Sister        RA   Lupus Sister    Breast cancer Neg Hx    Colon cancer Neg Hx     Social History   Tobacco Use   Smoking status: Never   Smokeless tobacco: Never  Substance Use Topics   Alcohol use: No    Alcohol/week: 0.0 standard drinks of alcohol     Current Outpatient Medications:    acetaminophen (TYLENOL) 500 MG tablet, Take 500 mg by mouth daily., Disp: , Rfl:    allopurinol (ZYLOPRIM) 300 MG tablet, TAKE 1 TABLET(300 MG) BY MOUTH TWICE DAILY, Disp: 180 tablet, Rfl: 1   Ascorbic Acid (VITAMIN C) POWD, Take 1,000 mg by mouth daily as needed., Disp: , Rfl:    aspirin 81 MG chewable tablet, Chew 81 mg by mouth daily., Disp: , Rfl:    blood glucose meter kit and supplies, Dispense based on patient and insurance preference. Use  up to four times daily as directed. (FOR ICD-10 E10.9, E11.9). Check fsbs up to twice daily, Disp: 1 each, Rfl: 0   Blood Glucose Monitoring Suppl (FREESTYLE LITE) w/Device KIT, Use as instructed to test blood sugar up to twice daily, Disp: 1 kit, Rfl: 0   Cholecalciferol 25 MCG (1000 UT) tablet, Take 1,000 Units by mouth in the morning and at bedtime., Disp: , Rfl:    Coenzyme Q10 (COQ10) 100 MG CAPS, Take 100 mg by mouth 2 (two) times daily., Disp: , Rfl:    Dapagliflozin Pro-metFORMIN ER (XIGDUO XR) 05-999 MG TB24, Take 1 tablet by mouth daily., Disp: 90 tablet, Rfl: 3   diclofenac Sodium (VOLTAREN) 1 % GEL, Apply 2 g topically 4 (four) times daily., Disp: 100 g,  Rfl: 2   ENBREL SURECLICK 50 MG/ML injection, Inject into the skin., Disp: , Rfl:    fluconazole (DIFLUCAN) 150 MG tablet, TAKE 1 TABLET BY MOUTH TWICE WEEKLY AND AS NEEDED, Disp: 24 tablet, Rfl: 0   fluticasone (FLONASE) 50 MCG/ACT nasal spray, SHAKE LIQUID AND USE 2 SPRAYS IN EACH NOSTRIL DAILY, Disp: 48 mL, Rfl: 0   glucose blood (FREESTYLE LITE) test strip, USE AS INSTRUCTED TO TEST BLOOD SUGAR UP TO TWICE DAILY, Disp: 200 strip, Rfl: 1   hydroxychloroquine (PLAQUENIL) 200 MG tablet, Take 200 mg by mouth 2 (two) times daily., Disp: , Rfl:    Lancets (FREESTYLE) lancets, Use as instructed to test blood sugar up to twice daily, Disp: 200 each, Rfl: 3   levocetirizine (XYZAL) 5 MG tablet, TAKE 1 TABLET BY MOUTH EVERY EVENING, Disp: 90 tablet, Rfl: 1   Multiple Vitamin (MULTIVITAMIN WITH MINERALS) TABS tablet, Take 1 tablet by mouth daily., Disp: , Rfl:    omega-3 acid ethyl esters (LOVAZA) 1 g capsule, TAKE 2 CAPSULES BY MOUTH TWICE A DAY, Disp: 360 capsule, Rfl: 0   pioglitazone (ACTOS) 15 MG tablet, Take 1 tablet (15 mg total) by mouth daily., Disp: 90 tablet, Rfl: 1   polyethylene glycol powder (GLYCOLAX/MIRALAX) 17 GM/SCOOP powder, Take 17 g by mouth daily as needed for mild constipation or moderate constipation., Disp: 850 g, Rfl:  0   rosuvastatin (CRESTOR) 40 MG tablet, TAKE 1 TABLET BY MOUTH EVERY DAY, Disp: 90 tablet, Rfl: 1   amLODipine-olmesartan (AZOR) 5-40 MG tablet, Take 1 tablet by mouth daily., Disp: 90 tablet, Rfl: 1   baclofen (LIORESAL) 10 MG tablet, Take 1 tablet (10 mg total) by mouth at bedtime. As needed, Disp: 90 tablet, Rfl: 1   Certolizumab Pegol 2 X 200 MG/ML KIT, Inject 1 each into the skin every 30 (thirty) days. Last dose:07/24/22.  Next dose:08/31/22 (Patient not taking: Reported on 01/07/2023), Disp: , Rfl:    montelukast (SINGULAIR) 10 MG tablet, Take 1 tablet (10 mg total) by mouth at bedtime., Disp: 90 tablet, Rfl: 1   pregabalin (LYRICA) 75 MG capsule, Take 1 capsule (75 mg total) by mouth 3 (three) times daily. One extra capsule with the pm dose, Disp: 360 capsule, Rfl: 1   Semaglutide, 2 MG/DOSE, 8 MG/3ML SOPN, Inject 2 mg as directed once a week., Disp: 9 mL, Rfl: 1  Allergies  Allergen Reactions   Ace Inhibitors Cough   Lipofen [Fenofibrate] Other (See Comments)    Localized drug reaction, blister on back   Penicillins Itching   Sulfa Antibiotics Itching    I personally reviewed active problem list, medication list, allergies, family history, social history, health maintenance with the patient/caregiver today.   ROS  Ten systems reviewed and is negative except as mentioned in HPI    Objective  Vitals:   06/24/23 0934  BP: 118/82  Pulse: 94  Resp: 14  Temp: 97.8 F (36.6 C)  TempSrc: Oral  SpO2: 100%  Weight: 218 lb 9.6 oz (99.2 kg)  Height: 5\' 7"  (1.702 m)    Body mass index is 34.24 kg/m.  Physical Exam  Constitutional: Patient appears well-developed and well-nourished. Obese  No distress.  HEENT: head atraumatic, normocephalic, pupils equal and reactive to light, neck supple Cardiovascular: Normal rate, regular rhythm and normal heart sounds.  No murmur heard. No BLE edema. Pulmonary/Chest: Effort normal and breath sounds normal. No respiratory  distress. Abdominal: Soft.  There is no tenderness. Psychiatric: Patient has a normal mood  and affect. behavior is normal. Judgment and thought content normal.   Recent Results (from the past 2160 hour(s))  POCT HgB A1C     Status: Abnormal   Collection Time: 06/24/23  9:36 AM  Result Value Ref Range   Hemoglobin A1C 8.1 (A) 4.0 - 5.6 %   HbA1c POC (<> result, manual entry)     HbA1c, POC (prediabetic range)     HbA1c, POC (controlled diabetic range)       PHQ2/9:    06/24/2023    9:36 AM 02/18/2023   10:00 AM 10/15/2022   10:37 AM 08/13/2022   10:29 AM 06/04/2022    8:37 AM  Depression screen PHQ 2/9  Decreased Interest 0 0 0 0 0  Down, Depressed, Hopeless 0 0 0 0 0  PHQ - 2 Score 0 0 0 0 0  Altered sleeping 0 0 0 0 0  Tired, decreased energy 0 0 0 0 0  Change in appetite 0 0 0 0 0  Feeling bad or failure about yourself  0 0 0 0 0  Trouble concentrating 0 0 0 0 0  Moving slowly or fidgety/restless 0 0 0 0 0  Suicidal thoughts 0 0 0 0 0  PHQ-9 Score 0 0 0 0 0  Difficult doing work/chores     Not difficult at all    phq 9 is negative   Fall Risk:    06/24/2023    9:35 AM 02/18/2023   10:00 AM 10/15/2022   10:36 AM 08/13/2022   10:29 AM 06/04/2022    8:27 AM  Fall Risk   Falls in the past year? 0 0 0 0 0  Number falls in past yr:   0 0 0  Injury with Fall?   0 0 0  Risk for fall due to : No Fall Risks No Fall Risks No Fall Risks No Fall Risks   Follow up Falls prevention discussed Falls prevention discussed Falls prevention discussed Falls prevention discussed       Functional Status Survey: Is the patient deaf or have difficulty hearing?: No Does the patient have difficulty seeing, even when wearing glasses/contacts?: No Does the patient have difficulty concentrating, remembering, or making decisions?: No Does the patient have difficulty walking or climbing stairs?: No Does the patient have difficulty dressing or bathing?: No Does the patient have difficulty doing  errands alone such as visiting a doctor's office or shopping?: No    Assessment & Plan  1. Type 2 diabetes mellitus with albuminuria (HCC)  - POCT HgB A1C - Semaglutide, 2 MG/DOSE, 8 MG/3ML SOPN; Inject 2 mg as directed once a week.  Dispense: 9 mL; Refill: 1 - pioglitazone (ACTOS) 15 MG tablet; Take 1 tablet (15 mg total) by mouth daily.  Dispense: 90 tablet; Refill: 1 - amLODipine-olmesartan (AZOR) 5-40 MG tablet; Take 1 tablet by mouth daily.  Dispense: 90 tablet; Refill: 1  2. Dyslipidemia associated with type 2 diabetes mellitus (HCC)  - Semaglutide, 2 MG/DOSE, 8 MG/3ML SOPN; Inject 2 mg as directed once a week.  Dispense: 9 mL; Refill: 1 - pioglitazone (ACTOS) 15 MG tablet; Take 1 tablet (15 mg total) by mouth daily.  Dispense: 90 tablet; Refill: 1  3. Rheumatoid arthritis of multiple sites with negative rheumatoid factor (HCC)  Trying to get approved for Embrel   4. Obesity, diabetes, and hypertension syndrome (HCC)  We will add Actos today   5. Primary osteoarthritis involving multiple joints  - pregabalin (LYRICA) 75 MG  capsule; Take 1 capsule (75 mg total) by mouth 3 (three) times daily. One extra capsule with the pm dose  Dispense: 360 capsule; Refill: 1  6. History of decompression of ulnar nerve  - pregabalin (LYRICA) 75 MG capsule; Take 1 capsule (75 mg total) by mouth 3 (three) times daily. One extra capsule with the pm dose  Dispense: 360 capsule; Refill: 1  7. Perennial allergic rhinitis  - montelukast (SINGULAIR) 10 MG tablet; Take 1 tablet (10 mg total) by mouth at bedtime.  Dispense: 90 tablet; Refill: 1  8. Dyslipidemia  On statin therapy   9. Controlled gout  Controlled   10. Spasm of back muscles  - baclofen (LIORESAL) 10 MG tablet; Take 1 tablet (10 mg total) by mouth at bedtime. As needed  Dispense: 90 tablet; Refill: 1  11. Hypertension, benign  - amLODipine-olmesartan (AZOR) 5-40 MG tablet; Take 1 tablet by mouth daily.  Dispense: 90  tablet; Refill: 1

## 2023-06-24 ENCOUNTER — Encounter: Payer: Self-pay | Admitting: Family Medicine

## 2023-06-24 ENCOUNTER — Ambulatory Visit (INDEPENDENT_AMBULATORY_CARE_PROVIDER_SITE_OTHER): Payer: Medicare HMO | Admitting: Family Medicine

## 2023-06-24 VITALS — BP 118/82 | HR 94 | Temp 97.8°F | Resp 14 | Ht 67.0 in | Wt 218.6 lb

## 2023-06-24 DIAGNOSIS — Z9889 Other specified postprocedural states: Secondary | ICD-10-CM | POA: Diagnosis not present

## 2023-06-24 DIAGNOSIS — E1129 Type 2 diabetes mellitus with other diabetic kidney complication: Secondary | ICD-10-CM | POA: Diagnosis not present

## 2023-06-24 DIAGNOSIS — E1169 Type 2 diabetes mellitus with other specified complication: Secondary | ICD-10-CM

## 2023-06-24 DIAGNOSIS — M0609 Rheumatoid arthritis without rheumatoid factor, multiple sites: Secondary | ICD-10-CM

## 2023-06-24 DIAGNOSIS — I10 Essential (primary) hypertension: Secondary | ICD-10-CM | POA: Diagnosis not present

## 2023-06-24 DIAGNOSIS — E785 Hyperlipidemia, unspecified: Secondary | ICD-10-CM | POA: Diagnosis not present

## 2023-06-24 DIAGNOSIS — E669 Obesity, unspecified: Secondary | ICD-10-CM | POA: Diagnosis not present

## 2023-06-24 DIAGNOSIS — J3089 Other allergic rhinitis: Secondary | ICD-10-CM | POA: Diagnosis not present

## 2023-06-24 DIAGNOSIS — I152 Hypertension secondary to endocrine disorders: Secondary | ICD-10-CM

## 2023-06-24 DIAGNOSIS — M62830 Muscle spasm of back: Secondary | ICD-10-CM

## 2023-06-24 DIAGNOSIS — M150 Primary generalized (osteo)arthritis: Secondary | ICD-10-CM | POA: Diagnosis not present

## 2023-06-24 DIAGNOSIS — E1159 Type 2 diabetes mellitus with other circulatory complications: Secondary | ICD-10-CM

## 2023-06-24 DIAGNOSIS — E119 Type 2 diabetes mellitus without complications: Secondary | ICD-10-CM

## 2023-06-24 DIAGNOSIS — M109 Gout, unspecified: Secondary | ICD-10-CM

## 2023-06-24 LAB — POCT GLYCOSYLATED HEMOGLOBIN (HGB A1C): Hemoglobin A1C: 8.1 % — AB (ref 4.0–5.6)

## 2023-06-24 MED ORDER — PIOGLITAZONE HCL 15 MG PO TABS: 15.0000 mg | ORAL_TABLET | Freq: Every day | ORAL | 1 refills | Status: DC

## 2023-06-24 MED ORDER — MONTELUKAST SODIUM 10 MG PO TABS: 10.0000 mg | ORAL_TABLET | Freq: Every day | ORAL | 1 refills | Status: DC

## 2023-06-24 MED ORDER — SEMAGLUTIDE (2 MG/DOSE) 8 MG/3ML ~~LOC~~ SOPN: 2.0000 mg | PEN_INJECTOR | SUBCUTANEOUS | 1 refills | Status: DC

## 2023-06-24 MED ORDER — AMLODIPINE-OLMESARTAN 5-40 MG PO TABS: 1.0000 | ORAL_TABLET | Freq: Every day | ORAL | 1 refills | Status: DC

## 2023-06-24 MED ORDER — PREGABALIN 75 MG PO CAPS: 75.0000 mg | ORAL_CAPSULE | Freq: Three times a day (TID) | ORAL | 1 refills | Status: DC

## 2023-06-24 MED ORDER — BACLOFEN 10 MG PO TABS: 10.0000 mg | ORAL_TABLET | Freq: Every day | ORAL | 1 refills | Status: DC

## 2023-06-29 ENCOUNTER — Other Ambulatory Visit: Payer: Self-pay | Admitting: Pharmacist

## 2023-06-29 NOTE — Progress Notes (Signed)
06/29/2023 Name: Maria Cruz MRN: [id number] DOB: Aug 14, 1955  Chief Complaint  Patient presents with   Medication Management   Medication Adherence   Medication Assistance    AMARII MIDGLEY is a 67 y.o. year old female who presented for a telephone visit.   They were referred to the pharmacist by their PCP for assistance in managing medication access.    Subjective:   Care Team: Primary Care Provider: Alba Cory, MD ; Next Scheduled Visit: 10/28/2023 Neurologist: Alphonzo Lemmings, MD Rheumatologist: Tracey Harries, MD Nephrologist: Lamont Dowdy, MD  Medication Access/Adherence  Current Pharmacy:  [facility] - [place], [place] - One [facility] AT Portal to Registered [address] [place] [address] Phone: [phone] Fax: [phone]  [facility] [address] ([address]), [place] - [address] [place] ([address]) [place] [address] Phone: [phone] Fax: [phone]  [id number] [name], [place] - [address] AT [facility] OF [address] [facility] [address] [place] [address] Phone: [phone] Fax: [phone]  [id number] [place],  - [address] [place] [address] Phone: [phone] Fax: [phone]  [facility] - [place], [place] - [address] [place] [address] Phone: [phone] Fax: [phone]   Patient reports affordability concerns with their medications: Yes  Patient reports access/transportation concerns to their pharmacy: No  Patient reports adherence concerns with their medications:  No    Using weekly pillbox; denies missed doses  Diabetes:   Current medications:  - Xigduo ER 05-999 mg daily - Ozempic 2 mg weekly - tolerating well - Planning to start pioglitazone 15 mg daily. Plans to pick up on Friday as recommended by PCP   Using Freestyle Libre Lite meter  to check her blood sugar  Reports morning fasting blood sugar reading ranging: 140s-150s   Reports has been working on making positive changes to improve dietary habits, including: - incorporating more non-starchy vegetables into her meals - avoiding fried foods - having more balanced meals and snacks   Statin: rosuvastatin 40 mg daily   Current physical activity: reports walking more recently ~30 minutes x everyday   Current medication access support: - Patient denied for Extra Help through Social Security - Enrolled in patient assistance for [employer] from [employer] through 08/09/2023  Reports has >2 month supply of Ozempic remaining - Enrolled in patient assistance for Xigduo through [place] through 08/08/2024    Hypertension:   Current medications: amlodipine-olmesartan 5-40 mg daily    Denies checking home blood pressure recently   Patient denies hypotensive s/sx including dizziness, lightheadedness.   Current physical activity: reports walking more recently ~30 minutes x everyday    Objective:  Lab Results  Component Value Date   HGBA1C 8.1 (A) 06/24/2023    Lab Results  Component Value Date   CREATININE 0.57 10/15/2022   BUN 13 10/15/2022   NA 141 10/15/2022   K 4.1 10/15/2022   CL 102 10/15/2022   CO2 22 10/15/2022    Lab Results  Component Value Date   CHOL 145 10/15/2022   HDL 60 10/15/2022   LDLCALC 56 10/15/2022   TRIG 177 (H) 10/15/2022   CHOLHDL 2.4 10/15/2022   BP Readings from Last 3 Encounters:  06/24/23 118/82  02/18/23 114/82  10/15/22 126/70   Pulse Readings from Last 3 Encounters:  06/24/23  94  02/18/23 97  10/15/22 96     Medications Reviewed Today     Reviewed by [name], RPH-CPP (Pharmacist) on 06/29/23 at 1525  Med List Status: <None>   Medication Order Taking? Sig Documenting Provider Last Dose Status Informant  acetaminophen (TYLENOL) 500 MG tablet [id number]  Take 500 mg by mouth daily. [provider]  Active   allopurinol (ZYLOPRIM) 300 MG tablet [id number]  TAKE 1 TABLET(300 MG) BY MOUTH TWICE DAILY Sowles, Danna Hefty, MD  Active   amLODipine-olmesartan (AZOR) 5-40 MG tablet [id number] Yes Take 1 tablet by mouth daily. Alba Cory, MD Taking Active   Ascorbic Acid (VITAMIN C) POWD [id number]  Take 1,000 mg by mouth daily as needed. [provider]  Active   aspirin 81 MG chewable tablet [id number]  Chew 81 mg by mouth daily. [provider]  Active Self  baclofen (LIORESAL) 10 MG tablet [id number]  Take 1 tablet (10 mg total) by mouth at bedtime. As needed Alba Cory, MD  Active   blood glucose meter kit and supplies [id number]  Dispense based on patient and insurance preference. Use up to four times daily as directed. (FOR ICD-10 E10.9, E11.9). Check fsbs up to twice daily Alba Cory, MD  Active   Blood Glucose Monitoring Suppl (FREESTYLE LITE) w/Device KIT [id number]  Use as instructed to test blood sugar up to twice daily Alba Cory, MD  Active   Cholecalciferol 25 MCG (1000 UT) tablet [id number]  Take 1,000 Units by mouth in the morning and at bedtime. [provider]  Active Self  Coenzyme Q10 (COQ10) 100 MG CAPS [id number]  Take 100 mg by mouth 2 (two) times daily. [provider]  Active   Dapagliflozin Pro-metFORMIN ER (XIGDUO XR) 05-999 MG TB24 [id number] Yes Take 1 tablet by mouth daily. Alba Cory, MD Taking Active   diclofenac Sodium (VOLTAREN) 1 % GEL [id number]  Apply 2 g topically 4 (four) times daily. Alba Cory, MD  Active   ENBREL SURECLICK 50 MG/ML injection [id number]  Inject into the skin. [provider]  Active   fluconazole (DIFLUCAN) 150 MG tablet [id number]  TAKE 1 TABLET BY MOUTH TWICE WEEKLY AND AS NEEDED Carlynn Purl, Danna Hefty, MD  Active   fluticasone (FLONASE) 50 MCG/ACT nasal spray [id number]  SHAKE LIQUID AND USE 2 SPRAYS IN EACH NOSTRIL DAILY Sowles, Danna Hefty, MD  Active   glucose blood (FREESTYLE  LITE) test strip [id number]  USE AS INSTRUCTED TO TEST BLOOD SUGAR UP TO TWICE DAILY Alba Cory, MD  Active   hydroxychloroquine (PLAQUENIL) 200 MG tablet [id number]  Take 200 mg by mouth 2 (two) times daily. Patterson Hammersmith, MD  Active Self  Lancets (FREESTYLE) lancets [id number]  Use as instructed to test blood sugar up to twice daily Alba Cory, MD  Active   levocetirizine (XYZAL) 5 MG tablet [id number]  TAKE 1 TABLET BY MOUTH EVERY Mena Goes, Danna Hefty, MD  Active   montelukast (SINGULAIR) 10 MG tablet [id number]  Take 1 tablet (10 mg total) by mouth at bedtime. Alba Cory, MD  Active   Multiple Vitamin (MULTIVITAMIN WITH MINERALS) TABS tablet [id number]  Take 1 tablet by mouth daily. [provider]  Active Self  omega-3 acid ethyl esters (LOVAZA) 1 g capsule [id number]  TAKE 2 CAPSULES BY MOUTH TWICE A DAY Sowles, Danna Hefty, MD  Active   pioglitazone (ACTOS) 15 MG tablet [id number]  Take 1 tablet (15 mg total) by mouth daily. Alba Cory, MD  Active   polyethylene glycol  powder (GLYCOLAX/MIRALAX) 17 GM/SCOOP powder [id number]  Take 17 g by mouth daily as needed for mild constipation or moderate constipation. Alba Cory, MD  Active   pregabalin (LYRICA) 75 MG capsule [id number]  Take 1 capsule (75 mg total) by mouth 3 (three) times daily. One extra capsule with the pm dose Alba Cory, MD  Active   rosuvastatin (CRESTOR) 40 MG tablet [id number]  TAKE 1 TABLET BY MOUTH EVERY DAY Alba Cory, MD  Active   Semaglutide, 2 MG/DOSE, 8 MG/3ML SOPN [id number] Yes Inject 2 mg as directed once a week. Alba Cory, MD Taking Active               Assessment/Plan:   Collaborating with [facility] CPhT for aid to patient with applying for patient assistance for Enbrel from [employer] now for 2025 calendar year Patient has mailed Extra Help denial letter to CPhT Linward Foster in October Patient to watch for patient assistance program applications for Enbrel from [employer] and Ozempic  from [employer] in the mail and will contact [place] with questions if needed     Diabetes: - Have reviewed goal A1c, goal fasting, and goal 2 hour post prandial glucose - Patient plans to pick up and start pioglitazone 15 mg daily as prescribed by PCP - Collaborating with CPhT and PCP to aid patient with renewal for Ozempic patient assistance from [employer]  Hypertension: - Reviewed long term cardiovascular and renal outcomes of uncontrolled blood pressure - Reviewed appropriate blood pressure monitoring technique and reviewed goal blood pressure. Recommended to restart checking home blood pressure and heart rate, keep log of results and have this record to review during upcoming medical appointments   Follow Up Plan: Clnical Pharmacist will follow up with patient by telephone on 08/24/2023 at 3:00 PM    Estelle Grumbles, PharmD, [facility] [phone]

## 2023-07-01 NOTE — Patient Instructions (Signed)
Goals Addressed             This Visit's Progress    Pharmacy Goals       Our goal A1c is less than 7%. This corresponds with fasting sugars less than 130 and 2 hour after meal sugars less than 180. Please keep a log of your results when checking your blood sugar   Our goal bad cholesterol, or LDL, is less than 70 . This is why it is important to continue taking your rosuvastatin.  Check your blood pressure once- twice weekly, and any time you have concerning symptoms like headache, chest pain, dizziness, shortness of breath, or vision changes.   Our goal is less than 130/80.  To appropriately check your blood pressure, make sure you do the following:  1) Avoid caffeine, exercise, or tobacco products for 30 minutes before checking. Empty your bladder. 2) Sit with your back supported in a flat-backed chair. Rest your arm on something flat (arm of the chair, table, etc). 3) Sit still with your feet flat on the floor, resting, for at least 5 minutes.  4) Check your blood pressure. Take 1-2 readings.  5) Write down these readings and bring with you to any provider appointments.  Bring your home blood pressure machine with you to a provider's office for accuracy comparison at least once a year.   Make sure you take your blood pressure medications before you come to any office visit, even if you were asked to fast for labs.   Estelle Grumbles, PharmD, [facility] [phone]

## 2023-07-04 ENCOUNTER — Encounter: Payer: Self-pay | Admitting: Pharmacist

## 2023-07-15 ENCOUNTER — Telehealth: Payer: Self-pay | Admitting: Family Medicine

## 2023-07-15 NOTE — Telephone Encounter (Unsigned)
Copied from CRM [id number]. Topic: General - Other >> Jul 15, 2023  3:19 PM Turkey B wrote: Reason for CRM: [place] for [employer] called in for pt for financial assistance. Francis Dowse says the application that was sent was cut off, so they need another one sent. Fx is [phone]

## 2023-07-18 NOTE — Telephone Encounter (Signed)
Faxed forms have been sent to scan center will have to wait till forms are scanned in.

## 2023-07-18 NOTE — Telephone Encounter (Signed)
Pt will be bringing copies in but they may be blank. Please make a copy to all her paperwork

## 2023-07-19 ENCOUNTER — Other Ambulatory Visit: Payer: Self-pay | Admitting: Family Medicine

## 2023-07-19 DIAGNOSIS — J3089 Other allergic rhinitis: Secondary | ICD-10-CM

## 2023-07-19 DIAGNOSIS — B3731 Acute candidiasis of vulva and vagina: Secondary | ICD-10-CM

## 2023-07-19 NOTE — Telephone Encounter (Signed)
Maria Cruz received paperwork and made copies and was given to nurse Larey Brick

## 2023-08-15 ENCOUNTER — Telehealth: Payer: Self-pay

## 2023-08-15 NOTE — Telephone Encounter (Signed)
-----   Message from Sharyle DELENA Sia sent at 08/12/2023 12:29 PM EST ----- Regarding: Novo Nordisk PAP Re-Enrollment Nat,  When you return, would you please check to see if patient has been approved for re-enrollment in PAP for Ozempic  for 2025 or if further action is needed?  Thank you!  Sharyle

## 2023-08-15 NOTE — Progress Notes (Signed)
 Pharmacy Medication Assistance Program Note    08/15/2023  Patient ID: Maria Cruz, female   DOB: 1956-04-26, 68 y.o.   MRN: [id number]     08/15/2023  Outreach Medication One  Manufacturer Medication One Novo Nordisk  Nordisk Drugs Ozempic   Dose of Ozempic  2MG   Type of Radiographer, Therapeutic Assistance  Date Application Sent to Patient 08/15/2023  Application Items Requested Application;Proof of Income  Date Application Sent to Prescriber 08/15/2023  Name of Prescriber [facility] SOWLES  Method Application Sent to [employer]

## 2023-08-15 NOTE — Telephone Encounter (Signed)
 A user error has taken place: orders placed in error, not carried out on this patient.

## 2023-08-15 NOTE — Telephone Encounter (Signed)
 PAP: Application for Ozempic has been submitted to PAP Companies: [employer], via fax    PLEASE BE ADVISED I WILL BE FAXING PROVIDER PAGES TO PROVIDER OFFICE OF OF Maria Cruz FOR REVIEW

## 2023-08-18 NOTE — Telephone Encounter (Signed)
 Faxed application and insurance information to [employer] at [phone]

## 2023-08-24 ENCOUNTER — Other Ambulatory Visit: Payer: Self-pay | Admitting: Pharmacist

## 2023-08-24 NOTE — Progress Notes (Signed)
 08/24/2023 Name: Maria Cruz MRN: [id number] DOB: [birth year]  Chief Complaint  Patient presents with   Medication Assistance   Medication Management    Maria Cruz is a 68 y.o. year old female who presented for a telephone visit.   They were referred to the pharmacist by their PCP for assistance in managing medication access.    Subjective:   Care Team: Primary Care Provider: Sowles, Krichna, MD ; Next Scheduled Visit: 10/28/2023 Neurologist: Roby Chris, MD Rheumatologist: Melissa Spring, MD; Next Scheduled Viist: 09/26/2023 Nephrologist: Gari Junior, MD  Medication Access/Adherence  Current Pharmacy:  [facility] - [place], [place] - [facility] AT Portal to Registered [address] One [place] [address] Phone: [phone] Fax: [phone]  [facility] [address]), [place] - [address] [place] ([address]) [place] [address] Phone: [phone] Fax: [phone]  [id number] [place], [place] - [address] AT [facility] OF [address] [place] [address] [place] [address] Phone: [phone] Fax: [phone]  [id number] [place], [place] - [address] [place] [address] Phone: [phone] Fax: [phone]  [facility] - [place], [place] - [address] [place] [address] Phone: [phone] Fax: [phone]   Patient reports affordability concerns with their medications: Yes  Patient reports access/transportation concerns to their pharmacy: No  Patient reports adherence concerns with their medications:  No     Using weekly pillbox; denies missed doses   Diabetes:   Current medications:  - Xigduo  ER 05-999 mg daily - Ozempic  2 mg weekly on Thursday- tolerating well - Pioglitazone  15 mg daily   Using [employer] meter to check her blood sugar   Reports blood  sugar reading ranging: 138-168   Reports has been working on making positive changes to improve dietary habits, including: - incorporating more non-starchy vegetables into her meals - avoiding fried foods - having more balanced meals and snacks   Statin: rosuvastatin  40 mg daily   Current physical activity: reports walking more recently ~30 minutes x everyday. [facility]ing to [facility] using [employer]   Current medication access support: - Patient denied for Extra Help through Social Security - Enrolled in patient assistance for Ozempic  from [employer] Reports received a call from [employer] advising that she was approved for re-enrollment for 2025 calendar year - Enrolled in patient assistance for Xigduo  through [employer] through 08/08/2024     Hypertension:   Current medications: amlodipine -olmesartan  5-40 mg daily    Recalls last checked ~2 weeks ago, reading ~120/72   Patient denies hypotensive s/sx including dizziness, lightheadedness.    Current physical activity: reports walking more recently ~30 minutes x everyday. Hoping to start [facility] using [employer]       Objective:  Lab Results  Component Value Date   HGBA1C 8.1 (A) 06/24/2023    Lab Results  Component Value Date   CREATININE 0.57 10/15/2022   BUN 13 10/15/2022   NA 141 10/15/2022   K 4.1 10/15/2022   CL 102 10/15/2022   CO2 22 10/15/2022    Lab Results  Component Value Date   CHOL 145 10/15/2022   HDL 60 10/15/2022   LDLCALC 56 10/15/2022   TRIG 177 (H) 10/15/2022   CHOLHDL 2.4 10/15/2022  BP Readings from Last 3 Encounters:  06/24/23 118/82  02/18/23 114/82  10/15/22 126/70   Pulse Readings from Last 3 Encounters:  06/24/23 94  02/18/23 97  10/15/22 96    Medications Reviewed Today     Reviewed by Ardis Becton, RPH-CPP (Pharmacist) on 08/24/23 at 1518  Med List Status: <None>   Medication Order Taking? Sig Documenting Provider Last  Dose Status Informant  acetaminophen  (TYLENOL ) 500 MG tablet [id number]  Take 500 mg by mouth daily. [provider]  Active   allopurinol  (ZYLOPRIM ) 300 MG tablet [id number]  TAKE 1 TABLET(300 MG) BY MOUTH TWICE DAILY Sowles, Krichna, MD  Active   amLODipine -olmesartan  (AZOR ) 5-40 MG tablet [id number] Yes Take 1 tablet by mouth daily. Sowles, Krichna, MD Taking Active   Ascorbic Acid (VITAMIN C) POWD [id number]  Take 1,000 mg by mouth daily as needed. [provider]  Active   aspirin 81 MG chewable tablet [id number]  Chew 81 mg by mouth daily. [provider]  Active Self  baclofen  (LIORESAL ) 10 MG tablet [id number]  Take 1 tablet (10 mg total) by mouth at bedtime. As needed Sowles, Krichna, MD  Active   blood glucose meter kit and supplies [id number]  Dispense based on patient and insurance preference. Use up to four times daily as directed. (FOR ICD-10 E10.9, E11.9). Check fsbs up to twice daily Sowles, Krichna, MD  Active   Blood Glucose Monitoring Suppl (FREESTYLE LITE) w/Device KIT [id number]  Use as instructed to test blood sugar up to twice daily Sowles, Krichna, MD  Active   Cholecalciferol 25 MCG (1000 UT) tablet [id number]  Take 1,000 Units by mouth in the morning and at bedtime. [provider]  Active Self  Coenzyme Q10 (COQ10) 100 MG CAPS [id number]  Take 100 mg by mouth 2 (two) times daily. [provider]  Active   Dapagliflozin  Pro-metFORMIN  ER (XIGDUO  XR) 05-999 MG TB24 [id number] Yes Take 1 tablet by mouth daily. Sowles, Krichna, MD Taking Active   diclofenac  Sodium (VOLTAREN ) 1 % GEL [id number]  Apply 2 g topically 4 (four) times daily. Sowles, Krichna, MD  Active   ENBREL SURECLICK 50 MG/ML injection [id number]  Inject into the skin. [provider]  Active   fluconazole  (DIFLUCAN ) 150 MG tablet [id number]  TAKE 1 TABLET BY MOUTH TWICE WEEKLY AND AS NEEDED Ava Lei, Krichna, MD  Active   fluticasone  (FLONASE ) 50 MCG/ACT nasal spray  [id number]  SHAKE LIQUID AND USE 2 SPRAYS IN EACH NOSTRIL DAILY Sowles, Krichna, MD  Active   glucose blood (FREESTYLE LITE) test strip [id number]  USE AS INSTRUCTED TO TEST BLOOD SUGAR UP TO TWICE DAILY Sowles, Krichna, MD  Active   hydroxychloroquine (PLAQUENIL) 200 MG tablet [id number]  Take 200 mg by mouth 2 (two) times daily. Adolphus Akin, MD  Active Self  Lancets (FREESTYLE) lancets [id number]  Use as instructed to test blood sugar up to twice daily Sowles, Krichna, MD  Active   levocetirizine (XYZAL ) 5 MG tablet [id number]  TAKE 1 TABLET BY MOUTH EVERY Tobias Forth, Krichna, MD  Active   montelukast  (SINGULAIR ) 10 MG tablet [id number]  Take 1 tablet (10 mg total) by mouth at bedtime. Sowles, Krichna, MD  Active   Multiple Vitamin (MULTIVITAMIN WITH MINERALS) TABS tablet [id number]  Take 1 tablet by mouth daily. [provider]  Active Self  omega-3 acid ethyl esters (LOVAZA ) 1 g capsule [id number]  TAKE 2 CAPSULES BY MOUTH TWICE A DAY Sowles, Krichna, MD  Active   pioglitazone  (  ACTOS ) 15 MG tablet [id number] Yes Take 1 tablet (15 mg total) by mouth daily. Sowles, Krichna, MD Taking Active   polyethylene glycol powder (GLYCOLAX /MIRALAX ) 17 GM/SCOOP powder [id number]  Take 17 g by mouth daily as needed for mild constipation or moderate constipation. Sowles, Krichna, MD  Active   pregabalin  (LYRICA ) 75 MG capsule [id number]  Take 1 capsule (75 mg total) by mouth 3 (three) times daily. One extra capsule with the pm dose Sowles, Krichna, MD  Active   rosuvastatin  (CRESTOR ) 40 MG tablet [id number] Yes TAKE 1 TABLET BY MOUTH EVERY DAY Sowles, Krichna, MD Taking Active   Semaglutide , 2 MG/DOSE, 8 MG/3ML SOPN [id number] Yes Inject 2 mg as directed once a week. Sowles, Krichna, MD Taking Active               Assessment/Plan:   Collaborating with [facility] CPhT for aid to patient with applying for patient assistance for Enbrel from [employer] for 2025 calendar year States will follow up with CPhT Almira Jaeger today regarding patient assistance program applications for Enbrel from [employer]  Diabetes: - Have reviewed goal A1c, goal fasting, and goal 2 hour post prandial glucose - Reviewed dietary modifications including importance of having regular well-balanced meals and snacks throughout the day, while controlling carbohydrate portion sizes - Patient to follow up with [employer] as needed for refills of Xigduo  and Novo Nordisk as needed for refills of Ozempic     Hypertension: - Reviewed long term cardiovascular and renal outcomes of uncontrolled blood pressure - Reviewed appropriate blood pressure monitoring technique and reviewed goal blood pressure. Recommended to check home blood pressure and heart rate, keep log of results and have this record to review during upcoming medical appointments   Follow Up Plan: Clnical Pharmacist will follow up with patient by telephone on 01/18/2024 at 3:00 PM     Arthur Lash, PharmD, [facility] [phone]

## 2023-08-24 NOTE — Patient Instructions (Signed)
 Goals Addressed             This Visit's Progress    Pharmacy Goals       Our goal A1c is less than 7%. This corresponds with fasting sugars less than 130 and 2 hour after meal sugars less than 180. Please keep a log of your results when checking your blood sugar   Our goal bad cholesterol, or LDL, is less than 70 . This is why it is important to continue taking your rosuvastatin.  Check your blood pressure once- twice weekly, and any time you have concerning symptoms like headache, chest pain, dizziness, shortness of breath, or vision changes.   Our goal is less than 130/80.  To appropriately check your blood pressure, make sure you do the following:  1) Avoid caffeine, exercise, or tobacco products for 30 minutes before checking. Empty your bladder. 2) Sit with your back supported in a flat-backed chair. Rest your arm on something flat (arm of the chair, table, etc). 3) Sit still with your feet flat on the floor, resting, for at least 5 minutes.  4) Check your blood pressure. Take 1-2 readings.  5) Write down these readings and bring with you to any provider appointments.  Bring your home blood pressure machine with you to a provider's office for accuracy comparison at least once a year.   Make sure you take your blood pressure medications before you come to any office visit, even if you were asked to fast for labs.   Estelle Grumbles, PharmD, [facility] [phone]

## 2023-08-31 NOTE — Telephone Encounter (Signed)
PAP: Patient assistance application Ozempic for has been approved by PAP Companies: NovoNordisk from 08/31/2023 to 08/08/2024. Medication should be delivered to PAP Delivery: Provider's office For further shipping updates, please contact [employer] at [phone] Pt ID is: NO ID    PLEASE BE ADIVED

## 2023-09-12 ENCOUNTER — Encounter: Payer: Self-pay | Admitting: Family Medicine

## 2023-09-12 ENCOUNTER — Other Ambulatory Visit: Payer: Self-pay | Admitting: Family Medicine

## 2023-09-12 DIAGNOSIS — Z1231 Encounter for screening mammogram for malignant neoplasm of breast: Secondary | ICD-10-CM

## 2023-09-26 DIAGNOSIS — M0609 Rheumatoid arthritis without rheumatoid factor, multiple sites: Secondary | ICD-10-CM | POA: Diagnosis not present

## 2023-09-26 DIAGNOSIS — Z79899 Other long term (current) drug therapy: Secondary | ICD-10-CM | POA: Diagnosis not present

## 2023-09-26 DIAGNOSIS — M150 Primary generalized (osteo)arthritis: Secondary | ICD-10-CM | POA: Diagnosis not present

## 2023-10-03 ENCOUNTER — Telehealth: Payer: Self-pay

## 2023-10-03 NOTE — Telephone Encounter (Signed)
 pt called wanting to see about revisiting of her PAP FOR ENBREL DUE TO COST HER INSURANCE WANTS HER TO PAY $1200 I THEN TOLD  HER TO REACH OUT TO THE CLINIC TO SPEAK TO YOU OR SOMEONE TO HELP HER WITH THAT WE ARE NOT WORKING WITH [employer] RIGHT NOW.   PLEASE SEE ENCOUNTER FROM ME ON 05/05/2023 TO REVISE IT DURING RE-ENROLLMENT.

## 2023-10-04 ENCOUNTER — Other Ambulatory Visit (HOSPITAL_COMMUNITY): Payer: Self-pay

## 2023-10-04 ENCOUNTER — Telehealth: Payer: Self-pay

## 2023-10-04 NOTE — Telephone Encounter (Signed)
 PAP: Patient assistance application for ENBREL through [employer] has been mailed to pt's home address on file.    PLEASE BE ADVISED  Provider portion of application HAS BEEN  faxed to provider's office OF [facility] SOWLES

## 2023-10-05 ENCOUNTER — Other Ambulatory Visit: Payer: Self-pay | Admitting: Family Medicine

## 2023-10-05 DIAGNOSIS — M109 Gout, unspecified: Secondary | ICD-10-CM

## 2023-10-05 NOTE — Telephone Encounter (Signed)
 Last visit 06/24/2023

## 2023-10-06 NOTE — Telephone Encounter (Signed)
 HAVE RECEIVED PROVIDER PAGES AND IS IN MEDIA OF CHART WAITING ON PT PAGES THAT WAS MAIL TO HOME 10/04/2023   PLEASE BE ADVISED

## 2023-10-07 ENCOUNTER — Other Ambulatory Visit: Payer: Self-pay | Admitting: Family Medicine

## 2023-10-07 DIAGNOSIS — E1169 Type 2 diabetes mellitus with other specified complication: Secondary | ICD-10-CM

## 2023-10-07 DIAGNOSIS — J3089 Other allergic rhinitis: Secondary | ICD-10-CM

## 2023-10-07 DIAGNOSIS — E1129 Type 2 diabetes mellitus with other diabetic kidney complication: Secondary | ICD-10-CM

## 2023-10-10 ENCOUNTER — Other Ambulatory Visit: Payer: Self-pay | Admitting: Family Medicine

## 2023-10-10 ENCOUNTER — Telehealth: Payer: Self-pay

## 2023-10-10 DIAGNOSIS — E1169 Type 2 diabetes mellitus with other specified complication: Secondary | ICD-10-CM

## 2023-10-10 MED ORDER — DAPAGLIFLOZIN PRO-METFORMIN ER 10-1000 MG PO TB24: 1.0000 | ORAL_TABLET | Freq: Every day | ORAL | 3 refills | Status: DC

## 2023-10-10 NOTE — Addendum Note (Signed)
 Addended by: Ruel Favors on: 10/10/2023 04:42 PM   Modules accepted: Orders

## 2023-10-10 NOTE — Telephone Encounter (Signed)
 Receive a new notification pt needs a new rx can we send one to [facility] for Xigdue Er 05/999

## 2023-10-10 NOTE — Addendum Note (Signed)
 Addended by: Estelle Grumbles A on: 10/10/2023 04:33 PM   Modules accepted: Orders

## 2023-10-11 ENCOUNTER — Telehealth: Payer: Self-pay

## 2023-10-11 NOTE — Telephone Encounter (Signed)
 Patient was identified as falling into the [place] Measure - Diabetes.   Patient was: Appointment scheduled with primary care provider in the next 30 days.

## 2023-10-11 NOTE — Telephone Encounter (Signed)
 HAVE RECEIVED PROVIDER AND PT PAGES AND PAP: Application for ENBREL  has been submitted to [employer], via fax   PLEASE BE ADVISED

## 2023-10-19 NOTE — Telephone Encounter (Signed)
 CALL COMPANY TO FOLLOW UP ON APPLICATION THEY SAID THEY DID NOT RECEIVED APP THAT WAS SENT ON 10/11/2023  RESENT TO COMPANY FOR PROCESSING VIA FAX

## 2023-10-25 ENCOUNTER — Ambulatory Visit
Admission: RE | Admit: 2023-10-25 | Discharge: 2023-10-25 | Disposition: A | Discharge status: Home / Self Care | Payer: PRIVATE HEALTH INSURANCE | Source: Ambulatory Visit | Attending: Family Medicine | Admitting: Family Medicine

## 2023-10-25 DIAGNOSIS — Z1231 Encounter for screening mammogram for malignant neoplasm of breast: Secondary | ICD-10-CM | POA: Diagnosis present

## 2023-10-28 ENCOUNTER — Ambulatory Visit: Payer: PRIVATE HEALTH INSURANCE | Admitting: Family Medicine

## 2023-10-28 ENCOUNTER — Encounter: Payer: Self-pay | Admitting: Family Medicine

## 2023-10-28 ENCOUNTER — Ambulatory Visit: Payer: Medicare HMO | Admitting: Family Medicine

## 2023-10-28 VITALS — BP 136/78 | HR 99 | Resp 16 | Ht 67.0 in | Wt 222.6 lb

## 2023-10-28 DIAGNOSIS — J3089 Other allergic rhinitis: Secondary | ICD-10-CM | POA: Diagnosis not present

## 2023-10-28 DIAGNOSIS — R809 Proteinuria, unspecified: Secondary | ICD-10-CM | POA: Diagnosis not present

## 2023-10-28 DIAGNOSIS — M0609 Rheumatoid arthritis without rheumatoid factor, multiple sites: Secondary | ICD-10-CM

## 2023-10-28 DIAGNOSIS — Z7985 Long-term (current) use of injectable non-insulin antidiabetic drugs: Secondary | ICD-10-CM

## 2023-10-28 DIAGNOSIS — I152 Hypertension secondary to endocrine disorders: Secondary | ICD-10-CM

## 2023-10-28 DIAGNOSIS — M109 Gout, unspecified: Secondary | ICD-10-CM

## 2023-10-28 DIAGNOSIS — E1129 Type 2 diabetes mellitus with other diabetic kidney complication: Secondary | ICD-10-CM | POA: Diagnosis not present

## 2023-10-28 DIAGNOSIS — E1169 Type 2 diabetes mellitus with other specified complication: Secondary | ICD-10-CM | POA: Diagnosis not present

## 2023-10-28 DIAGNOSIS — E1159 Type 2 diabetes mellitus with other circulatory complications: Secondary | ICD-10-CM

## 2023-10-28 DIAGNOSIS — E559 Vitamin D deficiency, unspecified: Secondary | ICD-10-CM

## 2023-10-28 DIAGNOSIS — E785 Hyperlipidemia, unspecified: Secondary | ICD-10-CM

## 2023-10-28 DIAGNOSIS — E669 Obesity, unspecified: Secondary | ICD-10-CM

## 2023-10-28 LAB — POCT GLYCOSYLATED HEMOGLOBIN (HGB A1C): Hemoglobin A1C: 6.7 % — AB (ref 4.0–5.6)

## 2023-10-28 MED ORDER — ROSUVASTATIN CALCIUM 40 MG PO TABS: ORAL_TABLET | ORAL | 1 refills | Status: DC

## 2023-10-28 MED ORDER — OMEGA-3-ACID ETHYL ESTERS 1 G PO CAPS: 2.0000 | ORAL_CAPSULE | Freq: Two times a day (BID) | ORAL | 1 refills | Status: DC

## 2023-10-28 NOTE — Progress Notes (Signed)
 Name: Maria Cruz   MRN: [id number]    DOB: Feb 22, 1956   Date:10/28/2023       Progress Note  Subjective  Chief Complaint  Chief Complaint  Patient presents with   Medical Management of Chronic Issues   HPI   DM II with proteinuria/obesity/HTN: urine micro has been elevated since 2019, under the care of Dr. Ronn Melena since 2021. She is on  ARB/CCB, Xigduo, Ozempic 2 mg through the assistance program and is also taking Actos   She has dyslipidemia and takes Rosuvastatin and Lovaza.  She denies polyphagia, polydipsia and polyuria.A1C is down from 8.1 % to 6.7 % . She has been going to senior daily , exercising and having lunch afterwards    Hyperlipidemia: Taking Lovaza and Rosuvastatin and LDL , last LDL was at  goal  at 56  continue medications. Denies myopathy . We will recheck labs today   HTN: She is on AZOR for now ( Norvasc plus Benicar ) Denies chest pain, palpitation. She has DM also and proteinuria at 422 , under the care of nephrologist , last GFR was normal. We will recheck labs today    Gout:  taking Allopurinol daily, denies side effects. She has been compliant with medication . No recent episodes  Last uric acid level was at goal but due for repeat levels   History of  left hip replacement on 12/05/2019 by Dr. Odis Luster, she completed PT , she states she is feeling better She takes lyrica for ulnar pain and also joint pains, currently four 75 mg daily and is helping with pain  Right ulnar nerve release: doing better, able to extend all fingers , she stopped going to PT due to cost but she states she has been doing some home exercises and symptoms are stable.    RA: diagnosed in 2021, seeing Dr. Allena Katz, could not take methotrexate because of elevation of liver enzymes,  she has been taking plaquenil but unable to afford  Cetrolizumab injections - they are trying to get her assistance . Still has stiffness and joint aches . Worse on left hand. Pain is 7/10   Perennial AR with  seasonal variation :she has rhinorrhea and nasal congestion, she takes medications to help with symptoms     Patient Active Problem List   Diagnosis Date Noted   History of decompression of ulnar nerve 06/04/2022   Dyslipidemia associated with type 2 diabetes mellitus (HCC) 06/04/2022   Cubital tunnel syndrome, right 07/16/2020   Meralgia paraesthetica, left 07/16/2020   Ulnar neuropathy at elbow, right 07/16/2020   Numbness and tingling 06/10/2020   Encounter for long-term (current) use of high-risk medication 06/02/2020   LFT elevation 06/02/2020   Osteoarthritis of spine with radiculopathy, cervical region 06/02/2020   Primary osteoarthritis involving multiple joints 06/02/2020   Rheumatoid arthritis of multiple sites with negative rheumatoid factor (HCC) 06/02/2020   Hip pain 03/27/2020   Osteoarthritis 03/27/2020   Sinus tachycardia 03/27/2020   History of total hip arthroplasty 12/18/2019   Proteinuria, unspecified 10/10/2019   Microcalcification of left breast on mammogram 05/03/2017   Herniated intervertebral disc of lumbar spine 09/02/2016   Spondylolisthesis of lumbar region 09/02/2016   Type 2 diabetes mellitus with albuminuria (HCC) 02/24/2015   Vitamin D deficiency 02/24/2015   Perennial allergic rhinitis 02/24/2015   Hypercalcemia 02/24/2015   GERD without esophagitis 02/24/2015   Hypertension, benign 02/24/2015   Controlled gout 02/24/2015   Fatty liver 02/24/2015   Callus of foot 02/24/2015  Primary osteoarthritis of right hip 02/24/2015   History of carpal tunnel syndrome 02/24/2015   Obesity (BMI 30-39.9) 02/24/2015   Umbilical hernia without obstruction or gangrene 02/24/2015   Intermittent low back pain 02/24/2015   History of colonic polyps 02/24/2015   Dyslipidemia 02/24/2015    Past Surgical History:  Procedure Laterality Date   ABDOMINAL HYSTERECTOMY  2012   ANTERIOR INTEROSSEOUS NERVE DECOMPRESSION Right 12/16/2020   Procedure: Right ulnar nerve  release, elbow;  Surgeon: Kennedy Bucker, MD;  Location: [facility];  Service: Orthopedics;  Laterality: Right;   ANTERIOR INTEROSSEOUS NERVE DECOMPRESSION Right 08/24/2022   Procedure: Repeat ulnar nerve release at elbow;  Surgeon: Kennedy Bucker, MD;  Location: [facility];  Service: Orthopedics;  Laterality: Right;  Diabetic   APPENDECTOMY  2012   BREAST BIOPSY Right 07/12/2012   Negative   BREAST BIOPSY Left 05/09/2017   Affirm Bx of two areas-FIBROADENOMA WITH COARSE CALCIFICATION. NEGATIVE FOR ATYPIA,COLLAPSED CYST AND FIBROADENOMATOID CHANGE   COLONOSCOPY  2009   Dr Servando Snare   COLONOSCOPY WITH PROPOFOL N/A 07/27/2017   Procedure: COLONOSCOPY WITH PROPOFOL;  Surgeon: Earline Mayotte, MD;  Location: [facility];  Service: Endoscopy;  Laterality: N/A;   DILATION AND CURETTAGE OF UTERUS  2007   REPLACEMENT TOTAL HIP W/  RESURFACING IMPLANTS Left 11/2019   TUBAL LIGATION      Family History  Problem Relation Age of Onset   Multiple sclerosis Mother    Heart attack Father    Kidney failure Sister    Heart attack Brother    Diabetes Daughter    CAD Sister    Arthritis Sister        RA   Lupus Sister    Breast cancer Neg Hx    Colon cancer Neg Hx     Social History   Tobacco Use   Smoking status: Never   Smokeless tobacco: Never  Substance Use Topics   Alcohol use: No    Alcohol/week: 0.0 standard drinks of alcohol     Current Outpatient Medications:    acetaminophen (TYLENOL) 500 MG tablet, Take 500 mg by mouth daily., Disp: , Rfl:    allopurinol (ZYLOPRIM) 300 MG tablet, TAKE 1 TABLET(300 MG) BY MOUTH TWICE DAILY, Disp: 180 tablet, Rfl: 1   amLODipine-olmesartan (AZOR) 5-40 MG tablet, Take 1 tablet by mouth daily., Disp: 90 tablet, Rfl: 1   Ascorbic Acid (VITAMIN C) POWD, Take 1,000 mg by mouth daily as needed., Disp: , Rfl:    aspirin 81 MG chewable tablet, Chew 81 mg by mouth daily., Disp: , Rfl:    baclofen (LIORESAL) 10 MG tablet, Take 1 tablet (10 mg total)  by mouth at bedtime. As needed, Disp: 90 tablet, Rfl: 1   blood glucose meter kit and supplies, Dispense based on patient and insurance preference. Use up to four times daily as directed. (FOR ICD-10 E10.9, E11.9). Check fsbs up to twice daily, Disp: 1 each, Rfl: 0   Blood Glucose Monitoring Suppl (FREESTYLE LITE) w/Device KIT, Use as instructed to test blood sugar up to twice daily, Disp: 1 kit, Rfl: 0   Cholecalciferol 25 MCG (1000 UT) tablet, Take 1,000 Units by mouth in the morning and at bedtime., Disp: , Rfl:    Coenzyme Q10 (COQ10) 100 MG CAPS, Take 100 mg by mouth 2 (two) times daily., Disp: , Rfl:    Dapagliflozin Pro-metFORMIN ER (XIGDUO XR) 05-999 MG TB24, Take 1 tablet by mouth daily., Disp: 90 tablet, Rfl: 3   diclofenac  Sodium (VOLTAREN) 1 % GEL, Apply 2 g topically 4 (four) times daily., Disp: 100 g, Rfl: 2   ENBREL SURECLICK 50 MG/ML injection, Inject into the skin., Disp: , Rfl:    fluconazole (DIFLUCAN) 150 MG tablet, TAKE 1 TABLET BY MOUTH TWICE WEEKLY AND AS NEEDED, Disp: 24 tablet, Rfl: 0   fluticasone (FLONASE) 50 MCG/ACT nasal spray, SHAKE LIQUID AND USE 2 SPRAYS IN EACH NOSTRIL DAILY, Disp: 48 mL, Rfl: 0   glucose blood (FREESTYLE LITE) test strip, USE AS INSTRUCTED TO TEST BLOOD SUGAR UP TO TWICE DAILY, Disp: 200 strip, Rfl: 1   hydroxychloroquine (PLAQUENIL) 200 MG tablet, Take 200 mg by mouth 2 (two) times daily., Disp: , Rfl:    Lancets (FREESTYLE) lancets, Use as instructed to test blood sugar up to twice daily, Disp: 200 each, Rfl: 3   levocetirizine (XYZAL) 5 MG tablet, TAKE 1 TABLET BY MOUTH EVERY EVENING, Disp: 90 tablet, Rfl: 1   montelukast (SINGULAIR) 10 MG tablet, Take 1 tablet (10 mg total) by mouth at bedtime., Disp: 90 tablet, Rfl: 1   Multiple Vitamin (MULTIVITAMIN WITH MINERALS) TABS tablet, Take 1 tablet by mouth daily., Disp: , Rfl:    omega-3 acid ethyl esters (LOVAZA) 1 g capsule, TAKE 2 CAPSULES BY MOUTH TWICE A DAY, Disp: 360 capsule, Rfl: 0    pioglitazone (ACTOS) 15 MG tablet, Take 1 tablet (15 mg total) by mouth daily., Disp: 90 tablet, Rfl: 1   polyethylene glycol powder (GLYCOLAX/MIRALAX) 17 GM/SCOOP powder, Take 17 g by mouth daily as needed for mild constipation or moderate constipation., Disp: 850 g, Rfl: 0   pregabalin (LYRICA) 75 MG capsule, Take 1 capsule (75 mg total) by mouth 3 (three) times daily. One extra capsule with the pm dose, Disp: 360 capsule, Rfl: 1   rosuvastatin (CRESTOR) 40 MG tablet, TAKE 1 TABLET BY MOUTH EVERY DAY, Disp: 90 tablet, Rfl: 1   Semaglutide, 2 MG/DOSE, 8 MG/3ML SOPN, Inject 2 mg as directed once a week., Disp: 9 mL, Rfl: 1  Allergies  Allergen Reactions   Ace Inhibitors Cough   Lipofen [Fenofibrate] Other (See Comments)    Localized drug reaction, blister on back   Penicillins Itching   Sulfa Antibiotics Itching    I personally reviewed active problem list, medication list, allergies with the patient/caregiver today.   ROS  Ten systems reviewed and is negative except as mentioned in HPI    Objective  Vitals:   10/28/23 0948  BP: 136/78  Pulse: 99  Resp: 16  SpO2: 97%  Weight: 222 lb 9.6 oz (101 kg)  Height: 5\' 7"  (1.702 m)    Body mass index is 34.86 kg/m.  Physical Exam  Constitutional: Patient appears well-developed and well-nourished. Obese No distress.  HEENT: head atraumatic, normocephalic, pupils equal and reactive to light, neck supple Cardiovascular: Normal rate, regular rhythm and normal heart sounds.  No murmur heard. No BLE edema. Pulmonary/Chest: Effort normal and breath sounds normal. No respiratory distress. Abdominal: Soft.  There is no tenderness. Psychiatric: Patient has a normal mood and affect. behavior is normal. Judgment and thought content normal.   Diabetic Foot Exam:     PHQ2/9:    06/24/2023    9:36 AM 02/18/2023   10:00 AM 10/15/2022   10:37 AM 08/13/2022   10:29 AM 06/04/2022    8:37 AM  Depression screen PHQ 2/9  Decreased Interest 0 0  0 0 0  Down, Depressed, Hopeless 0 0 0 0 0  PHQ - 2  Score 0 0 0 0 0  Altered sleeping 0 0 0 0 0  Tired, decreased energy 0 0 0 0 0  Change in appetite 0 0 0 0 0  Feeling bad or failure about yourself  0 0 0 0 0  Trouble concentrating 0 0 0 0 0  Moving slowly or fidgety/restless 0 0 0 0 0  Suicidal thoughts 0 0 0 0 0  PHQ-9 Score 0 0 0 0 0  Difficult doing work/chores     Not difficult at all    phq 9 is negative  Fall Risk:    10/28/2023    9:39 AM 06/24/2023    9:35 AM 02/18/2023   10:00 AM 10/15/2022   10:36 AM 08/13/2022   10:29 AM  Fall Risk   Falls in the past year? 0 0 0 0 0  Number falls in past yr: 0   0 0  Injury with Fall? 0   0 0  Risk for fall due to : No Fall Risks No Fall Risks No Fall Risks No Fall Risks No Fall Risks  Follow up Falls prevention discussed;Education provided;Falls evaluation completed Falls prevention discussed Falls prevention discussed Falls prevention discussed Falls prevention discussed     Assessment & Plan  1. Type 2 diabetes mellitus with albuminuria (HCC) (Primary)  - POCT glycosylated hemoglobin (Hb A1C) - Ambulatory referral to Ophthalmology - Comprehensive metabolic panel - Lipid panel - Microalbumin / creatinine urine ratio - CBC with Differential/Platelet  2. Obesity, diabetes, and hypertension syndrome (HCC)  - Comprehensive metabolic panel - CBC with Differential/Platelet  3. Rheumatoid arthritis of multiple sites with negative rheumatoid factor (HCC)  Under the care of Dr. Allena Katz   4. Perennial allergic rhinitis  Continue medication  5. Controlled gout  - Uric acid  6. Vitamin D deficiency  - VITAMIN D 25 Hydroxy (Vit-D Deficiency, Fractures)  7. Dyslipidemia associated with type 2 diabetes mellitus (HCC)  - omega-3 acid ethyl esters (LOVAZA) 1 g capsule; Take 2 capsules (2 g total) by mouth 2 (two) times daily.  Dispense: 360 capsule; Refill: 1 - rosuvastatin (CRESTOR) 40 MG tablet; TAKE 1 TABLET BY MOUTH  EVERY DAY  Dispense: 90 tablet; Refill: 1 - Lipid panel

## 2023-10-30 LAB — COMPREHENSIVE METABOLIC PANEL
ALT: 26 IU/L (ref 0–32)
AST: 29 IU/L (ref 0–40)
Albumin: 4.7 g/dL (ref 3.9–4.9)
Alkaline Phosphatase: 77 IU/L (ref 44–121)
BUN/Creatinine Ratio: 15 (ref 12–28)
BUN: 12 mg/dL (ref 8–27)
Bilirubin Total: 0.4 mg/dL (ref 0.0–1.2)
CO2: 25 mmol/L (ref 20–29)
Calcium: 10.1 mg/dL (ref 8.7–10.3)
Chloride: 103 mmol/L (ref 96–106)
Creatinine, Ser: 0.79 mg/dL (ref 0.57–1.00)
Globulin, Total: 2.4 g/dL (ref 1.5–4.5)
Glucose: 102 mg/dL — ABNORMAL HIGH (ref 70–99)
Potassium: 4.3 mmol/L (ref 3.5–5.2)
Sodium: 143 mmol/L (ref 134–144)
Total Protein: 7.1 g/dL (ref 6.0–8.5)
eGFR: 82 mL/min/{1.73_m2} (ref >59)

## 2023-10-30 LAB — CBC WITH DIFFERENTIAL/PLATELET
Basophils Absolute: 0 10*3/uL (ref 0.0–0.2)
Basos: 1 %
EOS (ABSOLUTE): 0.1 10*3/uL (ref 0.0–0.4)
Eos: 2 %
Hematocrit: 38.7 % (ref 34.0–46.6)
Hemoglobin: 12.5 g/dL (ref 11.1–15.9)
Immature Grans (Abs): 0 10*3/uL (ref 0.0–0.1)
Immature Granulocytes: 1 %
Lymphocytes Absolute: 2.1 10*3/uL (ref 0.7–3.1)
Lymphs: 49 %
MCH: 30.8 pg (ref 26.6–33.0)
MCHC: 32.3 g/dL (ref 31.5–35.7)
MCV: 95 fL (ref 79–97)
Monocytes Absolute: 0.4 10*3/uL (ref 0.1–0.9)
Monocytes: 10 %
Neutrophils Absolute: 1.6 10*3/uL (ref 1.4–7.0)
Neutrophils: 37 %
Platelets: 227 10*3/uL (ref 150–450)
RBC: 4.06 x10E6/uL (ref 3.77–5.28)
RDW: 13.6 % (ref 11.7–15.4)
WBC: 4.2 10*3/uL (ref 3.4–10.8)

## 2023-10-30 LAB — VITAMIN D 25 HYDROXY (VIT D DEFICIENCY, FRACTURES): Vit D, 25-Hydroxy: 53.4 ng/mL (ref 30.0–100.0)

## 2023-10-30 LAB — MICROALBUMIN / CREATININE URINE RATIO
Creatinine, Urine: 75.8 mg/dL
Microalb/Creat Ratio: 396 mg/g{creat} — ABNORMAL HIGH (ref 0–29)
Microalbumin, Urine: 299.9 ug/mL

## 2023-10-30 LAB — LIPID PANEL
Chol/HDL Ratio: 2.3 ratio (ref 0.0–4.4)
Cholesterol, Total: 116 mg/dL (ref 100–199)
HDL: 51 mg/dL (ref >39)
LDL Chol Calc (NIH): 44 mg/dL (ref 0–99)
Triglycerides: 115 mg/dL (ref 0–149)
VLDL Cholesterol Cal: 21 mg/dL (ref 5–40)

## 2023-10-30 LAB — URIC ACID: Uric Acid: 2.2 mg/dL — ABNORMAL LOW (ref 3.0–7.2)

## 2023-10-31 ENCOUNTER — Encounter: Payer: Self-pay | Admitting: Family Medicine

## 2023-11-08 LAB — HM DIABETES EYE EXAM

## 2023-11-17 ENCOUNTER — Other Ambulatory Visit: Payer: Self-pay | Admitting: Family Medicine

## 2023-11-17 DIAGNOSIS — J3089 Other allergic rhinitis: Secondary | ICD-10-CM

## 2023-11-17 DIAGNOSIS — E785 Hyperlipidemia, unspecified: Secondary | ICD-10-CM

## 2023-11-17 DIAGNOSIS — E669 Obesity, unspecified: Secondary | ICD-10-CM

## 2023-11-17 DIAGNOSIS — M62830 Muscle spasm of back: Secondary | ICD-10-CM

## 2023-11-17 DIAGNOSIS — B3731 Acute candidiasis of vulva and vagina: Secondary | ICD-10-CM

## 2023-11-20 ENCOUNTER — Other Ambulatory Visit: Payer: Self-pay | Admitting: Family Medicine

## 2023-11-20 DIAGNOSIS — J3089 Other allergic rhinitis: Secondary | ICD-10-CM

## 2023-11-20 DIAGNOSIS — M62830 Muscle spasm of back: Secondary | ICD-10-CM

## 2023-12-04 ENCOUNTER — Other Ambulatory Visit: Payer: Self-pay | Admitting: Family Medicine

## 2023-12-04 DIAGNOSIS — J3089 Other allergic rhinitis: Secondary | ICD-10-CM

## 2023-12-04 DIAGNOSIS — E1169 Type 2 diabetes mellitus with other specified complication: Secondary | ICD-10-CM

## 2023-12-04 DIAGNOSIS — E1129 Type 2 diabetes mellitus with other diabetic kidney complication: Secondary | ICD-10-CM

## 2024-01-09 ENCOUNTER — Other Ambulatory Visit: Payer: Self-pay | Admitting: Family Medicine

## 2024-01-09 DIAGNOSIS — I10 Essential (primary) hypertension: Secondary | ICD-10-CM

## 2024-01-09 DIAGNOSIS — M62830 Muscle spasm of back: Secondary | ICD-10-CM

## 2024-01-09 DIAGNOSIS — B3731 Acute candidiasis of vulva and vagina: Secondary | ICD-10-CM

## 2024-01-09 DIAGNOSIS — J3089 Other allergic rhinitis: Secondary | ICD-10-CM

## 2024-01-09 DIAGNOSIS — E1129 Type 2 diabetes mellitus with other diabetic kidney complication: Secondary | ICD-10-CM

## 2024-01-18 ENCOUNTER — Other Ambulatory Visit: Payer: PRIVATE HEALTH INSURANCE | Admitting: Pharmacist

## 2024-01-18 ENCOUNTER — Telehealth: Payer: Self-pay | Admitting: Pharmacist

## 2024-01-18 DIAGNOSIS — I10 Essential (primary) hypertension: Secondary | ICD-10-CM

## 2024-01-18 DIAGNOSIS — R809 Proteinuria, unspecified: Secondary | ICD-10-CM

## 2024-01-18 NOTE — Patient Instructions (Signed)
 Goals Addressed             This Visit's Progress    Pharmacy Goals       Our goal A1c is less than 7%. This corresponds with fasting sugars less than 130 and 2 hour after meal sugars less than 180. Please keep a log of your results when checking your blood sugar   Our goal bad cholesterol, or LDL, is less than 70 . This is why it is important to continue taking your rosuvastatin.  Check your blood pressure once- twice weekly, and any time you have concerning symptoms like headache, chest pain, dizziness, shortness of breath, or vision changes.   Our goal is less than 130/80.  To appropriately check your blood pressure, make sure you do the following:  1) Avoid caffeine, exercise, or tobacco products for 30 minutes before checking. Empty your bladder. 2) Sit with your back supported in a flat-backed chair. Rest your arm on something flat (arm of the chair, table, etc). 3) Sit still with your feet flat on the floor, resting, for at least 5 minutes.  4) Check your blood pressure. Take 1-2 readings.  5) Write down these readings and bring with you to any provider appointments.  Bring your home blood pressure machine with you to a provider's office for accuracy comparison at least once a year.   Make sure you take your blood pressure medications before you come to any office visit, even if you were asked to fast for labs.   Estelle Grumbles, PharmD, [facility] [phone]

## 2024-01-18 NOTE — Progress Notes (Signed)
 01/18/2024 Name: Maria Cruz MRN: [id number] DOB: 1956-02-15  Chief Complaint  Patient presents with   Medication Management   Medication Assistance    Maria Cruz is a 68 y.o. year old female who presented for a telephone visit.   They were referred to the pharmacist by their PCP for assistance in managing medication access.    Subjective:   Care Team: Primary Care Provider: Sowles, Krichna, MD ; Next Scheduled Visit: 02/28/2024 Neurologist: Roby Chris, MD; Next Scheduled Visit: 03/02/2024 Rheumatologist: Melissa Spring, MD; Next Scheduled Viist: 01/24/2024 Nephrologist: Gari Junior, MD; Next Scheduled Visit: tomorrow  Medication Access/Adherence  Current Pharmacy:  [facility] [address] ([address]), [place] - [address] ([address]) [place] [address] Phone: [phone] Fax: [phone]  [id number] - [place], [place] - [address] [place] [address] Phone: [phone] Fax: [phone]   Patient reports affordability concerns with their medications: No Patient reports access/transportation concerns to their pharmacy: No  Patient reports adherence concerns with their medications:  No     Using weekly pillbox; denies missed doses   Diabetes:   Current medications:  - Xigduo  ER 05-999 mg daily - Ozempic  2 mg weekly  - Pioglitazone  15 mg daily   Using Freestyle Libre Lite meter to check her blood sugar   Reports has been checking blood sugar, but does not recall recent results   Statin: rosuvastatin  40 mg daily   Current physical activity: reports going to gym for ~40 minutes x 5 days/week. Also looking into starting Silver Sneakers for water aerobics   Current medication access support: - Patient denied for Extra Help through Social Security - Enrolled in patient assistance for Ozempic  from [employer] through 08/08/2024 - Enrolled in patient assistance for Xigduo  through  [employer] through 08/08/2024     Hypertension:   Current medications: amlodipine -olmesartan  5-40 mg daily    Recalls last checked last checked last week: 120/72   Patient denies hypotensive s/sx including dizziness, lightheadedness.    Current physical activity: reports going to gym for ~40 minutes x 5 days/week. Also looking into starting Silver Sneakers for water aerobics    Objective:  Lab Results  Component Value Date   HGBA1C 6.7 (A) 10/28/2023    Lab Results  Component Value Date   CREATININE 0.79 10/28/2023   BUN 12 10/28/2023   NA 143 10/28/2023   K 4.3 10/28/2023   CL 103 10/28/2023   CO2 25 10/28/2023    Lab Results  Component Value Date   CHOL 116 10/28/2023   HDL 51 10/28/2023   LDLCALC 44 10/28/2023   TRIG 115 10/28/2023   CHOLHDL 2.3 10/28/2023   BP Readings from Last 3 Encounters:  10/28/23 136/78  06/24/23 118/82  02/18/23 114/82   Pulse Readings from Last 3 Encounters:  10/28/23 99  06/24/23 94  02/18/23 97     Medications Reviewed Today     Reviewed by Ardis Becton, RPH-CPP (Pharmacist) on 01/18/24 at 1627  Med List Status: <None>   Medication Order Taking? Sig Documenting Provider Last Dose Status Informant  acetaminophen  (TYLENOL ) 500 MG tablet [id number]  Take 500 mg by mouth daily. [provider]  Active   allopurinol  (ZYLOPRIM ) 300 MG tablet [id number]  TAKE 1 TABLET(300 MG) BY MOUTH TWICE DAILY Sowles, Krichna, MD  Active   amLODipine -olmesartan  (AZOR ) 5-40 MG tablet [id number] Yes TAKE 1 TABLET BY MOUTH EVERY DAY Sowles, Krichna, MD Taking  Active   Ascorbic Acid (VITAMIN C) POWD [id number]  Take 1,000 mg by mouth daily as needed. [provider]  Active   aspirin 81 MG chewable tablet [id number]  Chew 81 mg by mouth daily. [provider]  Active Self  baclofen  (LIORESAL ) 10 MG tablet [id number]  TAKE 1 TABLET (10 MG TOTAL) BY MOUTH AT BEDTIME. AS NEEDED Sowles, Krichna, MD  Active   blood glucose meter  kit and supplies [id number]  Dispense based on patient and insurance preference. Use up to four times daily as directed. (FOR ICD-10 E10.9, E11.9). Check fsbs up to twice daily Sowles, Krichna, MD  Active   Blood Glucose Monitoring Suppl (FREESTYLE LITE) w/Device KIT [id number]  Use as instructed to test blood sugar up to twice daily Sowles, Krichna, MD  Active   Cholecalciferol 25 MCG (1000 UT) tablet [id number]  Take 1,000 Units by mouth in the morning and at bedtime. [provider]  Active Self  Coenzyme Q10 (COQ10) 100 MG CAPS [id number]  Take 100 mg by mouth 2 (two) times daily. [provider]  Active   Dapagliflozin  Pro-metFORMIN  ER (XIGDUO  XR) 05-999 MG TB24 [id number] Yes Take 1 tablet by mouth daily. Sowles, Krichna, MD Taking Active   diclofenac  Sodium (VOLTAREN ) 1 % GEL [id number]  Apply 2 g topically 4 (four) times daily. Sowles, Krichna, MD  Active   ENBREL SURECLICK 50 MG/ML injection [id number]  Inject into the skin. [provider]  Active   fluconazole  (DIFLUCAN ) 150 MG tablet [id number]  TAKE 1 TABLET BY MOUTH TWICE WEEKLY AND AS NEEDED Ava Lei, Krichna, MD  Active   fluticasone  (FLONASE ) 50 MCG/ACT nasal spray [id number]  SHAKE LIQUID AND USE 2 SPRAYS IN EACH NOSTRIL DAILY Sowles, Krichna, MD  Active   glucose blood (FREESTYLE LITE) test strip [id number]  USE AS INSTRUCTED TO TEST BLOOD SUGAR UP TO TWICE DAILY Sowles, Krichna, MD  Active   hydroxychloroquine (PLAQUENIL) 200 MG tablet [id number]  Take 200 mg by mouth 2 (two) times daily. Adolphus Akin, MD  Active Self  Lancets (FREESTYLE) lancets [id number]  Use as instructed to test blood sugar up to twice daily Sowles, Krichna, MD  Active   levocetirizine (XYZAL ) 5 MG tablet [id number]  TAKE 1 TABLET BY MOUTH EVERY EVENING Sowles, Krichna, MD  Active   montelukast  (SINGULAIR ) 10 MG tablet [id number]  Take 1 tablet (10 mg total) by mouth at bedtime. Sowles, Krichna, MD  Active   Multiple Vitamin (MULTIVITAMIN WITH  MINERALS) TABS tablet [id number]  Take 1 tablet by mouth daily. [provider]  Active Self  omega-3 acid ethyl esters (LOVAZA ) 1 g capsule [id number]  Take 2 capsules (2 g total) by mouth 2 (two) times daily. Sowles, Krichna, MD  Active   pioglitazone  (ACTOS ) 15 MG tablet [id number] Yes Take 1 tablet (15 mg total) by mouth daily. Sowles, Krichna, MD Taking Active   polyethylene glycol powder (GLYCOLAX /MIRALAX ) 17 GM/SCOOP powder [id number]  Take 17 g by mouth daily as needed for mild constipation or moderate constipation. Sowles, Krichna, MD  Active   pregabalin  (LYRICA ) 75 MG capsule [id number]  Take 1 capsule (75 mg total) by mouth 3 (three) times daily. One extra capsule with the pm dose Sowles, Krichna, MD  Active   rosuvastatin  (CRESTOR ) 40 MG tablet [id number]  TAKE 1 TABLET BY MOUTH EVERY DAY Sowles, Krichna, MD  Active   Semaglutide , 2 MG/DOSE, 8 MG/3ML SOPN [id number] Yes Inject 2 mg as directed once a week. Sowles, Krichna, MD Taking  Active               Assessment/Plan:   Diabetes: - Have reviewed goal A1c, goal fasting, and goal 2 hour post prandial glucose - Reviewed dietary modifications including importance of having regular well-balanced meals and snacks throughout the day, while controlling carbohydrate portion sizes  Discuss balanced snack ideas - Patient to follow up with [employer] as needed for refills of Xigduo  and Novo Nordisk as needed for refills of Ozempic  - Recommend to monitor home blood sugar, restart keeping log of results and have this record to review at upcoming medical appointments. Patient to contact provider office sooner if needed for readings outside of established parameters or symptoms  Encourage patient to use blood sugar readings as feedback on dietary choices   Hypertension: - Reviewed long term cardiovascular and renal outcomes of uncontrolled blood pressure - Reviewed appropriate blood pressure monitoring technique and reviewed goal blood  pressure. Recommended to check home blood pressure and heart rate, keep log of results and have this record to review during upcoming medical appointments   Follow Up Plan: Clnical Pharmacist will follow up with patient by telephone on 05/16/2024 at 3:30 PM    Arthur Lash, PharmD, [facility] [phone]

## 2024-01-18 NOTE — Progress Notes (Signed)
   Outreach Note  01/18/2024 Name: Maria Cruz MRN: [id number] DOB: [birth year]  Referred by: Sowles, Krichna, MD  Was unable to reach patient via telephone today and have left HIPAA compliant voicemail asking patient to return my call.    Arthur Lash, PharmD, [facility] [phone]

## 2024-01-26 ENCOUNTER — Other Ambulatory Visit: Payer: Self-pay | Admitting: Family Medicine

## 2024-01-26 DIAGNOSIS — B3731 Acute candidiasis of vulva and vagina: Secondary | ICD-10-CM

## 2024-01-26 DIAGNOSIS — M62830 Muscle spasm of back: Secondary | ICD-10-CM

## 2024-01-26 DIAGNOSIS — M109 Gout, unspecified: Secondary | ICD-10-CM

## 2024-01-26 DIAGNOSIS — J3089 Other allergic rhinitis: Secondary | ICD-10-CM

## 2024-02-07 ENCOUNTER — Other Ambulatory Visit: Payer: Self-pay | Admitting: Family Medicine

## 2024-02-07 DIAGNOSIS — E1169 Type 2 diabetes mellitus with other specified complication: Secondary | ICD-10-CM

## 2024-02-26 ENCOUNTER — Other Ambulatory Visit: Payer: Self-pay | Admitting: Family Medicine

## 2024-02-26 DIAGNOSIS — M62830 Muscle spasm of back: Secondary | ICD-10-CM

## 2024-02-26 DIAGNOSIS — M109 Gout, unspecified: Secondary | ICD-10-CM

## 2024-02-26 DIAGNOSIS — E1169 Type 2 diabetes mellitus with other specified complication: Secondary | ICD-10-CM

## 2024-02-28 ENCOUNTER — Ambulatory Visit: Admitting: Family Medicine

## 2024-02-28 ENCOUNTER — Encounter: Payer: Self-pay | Admitting: Family Medicine

## 2024-02-28 VITALS — BP 118/72 | HR 95 | Resp 16 | Ht 67.0 in | Wt 220.8 lb

## 2024-02-28 DIAGNOSIS — E1169 Type 2 diabetes mellitus with other specified complication: Secondary | ICD-10-CM

## 2024-02-28 DIAGNOSIS — M150 Primary generalized (osteo)arthritis: Secondary | ICD-10-CM

## 2024-02-28 DIAGNOSIS — M0609 Rheumatoid arthritis without rheumatoid factor, multiple sites: Secondary | ICD-10-CM

## 2024-02-28 DIAGNOSIS — I152 Hypertension secondary to endocrine disorders: Secondary | ICD-10-CM

## 2024-02-28 DIAGNOSIS — E1129 Type 2 diabetes mellitus with other diabetic kidney complication: Secondary | ICD-10-CM

## 2024-02-28 DIAGNOSIS — M109 Gout, unspecified: Secondary | ICD-10-CM | POA: Diagnosis not present

## 2024-02-28 DIAGNOSIS — F4321 Adjustment disorder with depressed mood: Secondary | ICD-10-CM

## 2024-02-28 DIAGNOSIS — J3089 Other allergic rhinitis: Secondary | ICD-10-CM | POA: Diagnosis not present

## 2024-02-28 DIAGNOSIS — R809 Proteinuria, unspecified: Secondary | ICD-10-CM | POA: Diagnosis not present

## 2024-02-28 DIAGNOSIS — E1159 Type 2 diabetes mellitus with other circulatory complications: Secondary | ICD-10-CM

## 2024-02-28 DIAGNOSIS — B3731 Acute candidiasis of vulva and vagina: Secondary | ICD-10-CM

## 2024-02-28 DIAGNOSIS — Z9889 Other specified postprocedural states: Secondary | ICD-10-CM

## 2024-02-28 DIAGNOSIS — E669 Obesity, unspecified: Secondary | ICD-10-CM

## 2024-02-28 LAB — POCT GLYCOSYLATED HEMOGLOBIN (HGB A1C): Hemoglobin A1C: 7.2 % — AB (ref 4.0–5.6)

## 2024-02-28 MED ORDER — MONTELUKAST SODIUM 10 MG PO TABS: 10.0000 mg | ORAL_TABLET | Freq: Every day | ORAL | 1 refills | Status: DC

## 2024-02-28 MED ORDER — PREGABALIN 100 MG PO CAPS: 100.0000 mg | ORAL_CAPSULE | Freq: Three times a day (TID) | ORAL | 1 refills | Status: DC

## 2024-02-28 MED ORDER — ALLOPURINOL 300 MG PO TABS: ORAL_TABLET | ORAL | 1 refills | Status: DC

## 2024-02-28 MED ORDER — PIOGLITAZONE HCL 15 MG PO TABS: 15.0000 mg | ORAL_TABLET | Freq: Every day | ORAL | 1 refills | Status: DC

## 2024-02-28 MED ORDER — SEMAGLUTIDE (2 MG/DOSE) 8 MG/3ML ~~LOC~~ SOPN: 2.0000 mg | PEN_INJECTOR | SUBCUTANEOUS | 3 refills | Status: AC

## 2024-02-28 MED ORDER — OMEGA-3-ACID ETHYL ESTERS 1 G PO CAPS: 2.0000 | ORAL_CAPSULE | Freq: Two times a day (BID) | ORAL | 1 refills | Status: DC

## 2024-02-28 MED ORDER — FLUCONAZOLE 150 MG PO TABS: ORAL_TABLET | ORAL | 0 refills | Status: DC

## 2024-02-28 NOTE — Progress Notes (Signed)
 Name: Maria Cruz   MRN: [id number]    DOB: 1955-12-17   Date:02/29/2024       Progress Note  Subjective  Chief Complaint  Chief Complaint  Patient presents with   Medical Management of Chronic Issues   Discussed the use of AI scribe software for clinical note transcription with the patient, who gave verbal consent to proceed.  History of Present Illness Maria Cruz is a 68 year old female with type 2 diabetes, hypertension, and dyslipidemia who presents for a four-month follow-up.  Her hemoglobin A1c has increased from 6.7% in March to 7.2% today, which she attributes to recent stress and decreased exercise following a family tragedy. She denies symptoms of hyperglycemia such as increased hunger, thirst, or urination. Her current medications include Xigduo  05/999 mg daily, Ozempic  2 mg, and pioglitazone  50 mg. She experiences occasional yeast infections, for which she takes fluconazole .  She has a history of proteinuria consistent with diabetic nephropathy and is under the care of a nephrologist. She is taking olmesartan  for kidney protection and has been prescribed Kerendia by nephrologist . She is also on  Xigduo , and her insurance covers these medications.  Her dyslipidemia is managed with rosuvastatin  40 mg and Lovaza  (fish oil) twice daily.  She has a history of gout, managed with allopurinol  300 mg daily, with no recent flares.  She has rheumatoid arthritis and osteoarthritis, with a history of ulnar nerve decompression. She experiences tingling and numbness in her arm and takes pregabalin  100 mg three times daily for neuropathic pain. She also takes Enbrel and hydroxychloroquine 200 mg for rheumatoid arthritis.  Her hypertension is managed with amlodipine  and olmesartan , and her blood pressure is well-controlled. She also takes baclofen  for muscle spasms and montelukast  for allergies.  She has been experiencing significant stress due to a recent family tragedy, which has  impacted her exercise routine and possibly her blood sugar levels. She attends a senior center for social support and participates in exercise and meal programs.    Patient Active Problem List   Diagnosis Date Noted   History of decompression of ulnar nerve 06/04/2022   Dyslipidemia associated with type 2 diabetes mellitus (HCC) 06/04/2022   Cubital tunnel syndrome, right 07/16/2020   Meralgia paraesthetica, left 07/16/2020   Ulnar neuropathy at elbow, right 07/16/2020   Numbness and tingling 06/10/2020   Encounter for long-term (current) use of high-risk medication 06/02/2020   LFT elevation 06/02/2020   Osteoarthritis of spine with radiculopathy, cervical region 06/02/2020   Primary osteoarthritis involving multiple joints 06/02/2020   Rheumatoid arthritis of multiple sites with negative rheumatoid factor (HCC) 06/02/2020   Hip pain 03/27/2020   Osteoarthritis 03/27/2020   Sinus tachycardia 03/27/2020   History of total hip arthroplasty 12/18/2019   Proteinuria, unspecified 10/10/2019   Microcalcification of left breast on mammogram 05/03/2017   Herniated intervertebral disc of lumbar spine 09/02/2016   Spondylolisthesis of lumbar region 09/02/2016   Type 2 diabetes mellitus with albuminuria (HCC) 02/24/2015   Vitamin D  deficiency 02/24/2015   Perennial allergic rhinitis 02/24/2015   Hypercalcemia 02/24/2015   GERD without esophagitis 02/24/2015   Hypertension, benign 02/24/2015   Controlled gout 02/24/2015   Fatty liver 02/24/2015   Callus of foot 02/24/2015   Primary osteoarthritis of right hip 02/24/2015   History of carpal tunnel syndrome 02/24/2015   Obesity (BMI 30-39.9) 02/24/2015   Umbilical hernia without obstruction or gangrene 02/24/2015   Intermittent low back pain 02/24/2015   History of colonic polyps 02/24/2015  Dyslipidemia 02/24/2015    Past Surgical History:  Procedure Laterality Date   ABDOMINAL HYSTERECTOMY  2012   ANTERIOR INTEROSSEOUS NERVE  DECOMPRESSION Right 12/16/2020   Procedure: Right ulnar nerve release, elbow;  Surgeon: Kathlynn Sharper, MD;  Location: [facility];  Service: Orthopedics;  Laterality: Right;   ANTERIOR INTEROSSEOUS NERVE DECOMPRESSION Right 08/24/2022   Procedure: Repeat ulnar nerve release at elbow;  Surgeon: Kathlynn Sharper, MD;  Location: [facility];  Service: Orthopedics;  Laterality: Right;  Diabetic   APPENDECTOMY  2012   BREAST BIOPSY Right 07/12/2012   Negative   BREAST BIOPSY Left 05/09/2017   Affirm Bx of two areas-FIBROADENOMA WITH COARSE CALCIFICATION. NEGATIVE FOR ATYPIA,COLLAPSED CYST AND FIBROADENOMATOID CHANGE   COLONOSCOPY  2009   Dr Jinny   COLONOSCOPY WITH PROPOFOL  N/A 07/27/2017   Procedure: COLONOSCOPY WITH PROPOFOL ;  Surgeon: Dessa Reyes ORN, MD;  Location: [facility];  Service: Endoscopy;  Laterality: N/A;   DILATION AND CURETTAGE OF UTERUS  2007   JOINT REPLACEMENT  2021   REPLACEMENT TOTAL HIP W/  RESURFACING IMPLANTS Left 11/2019   TUBAL LIGATION      Family History  Problem Relation Age of Onset   Multiple sclerosis Mother    Heart attack Father    Kidney failure Sister    Heart attack Brother    Diabetes Daughter    CAD Sister    Arthritis Sister        Cruz   Lupus Sister    Breast cancer Neg Hx    Colon cancer Neg Hx     Social History   Tobacco Use   Smoking status: Never   Smokeless tobacco: Never  Substance Use Topics   Alcohol use: No    Alcohol/week: 0.0 standard drinks of alcohol     Current Outpatient Medications:    acetaminophen  (TYLENOL ) 500 MG tablet, Take 500 mg by mouth daily., Disp: , Rfl:    amLODipine -olmesartan  (AZOR ) 5-40 MG tablet, TAKE 1 TABLET BY MOUTH EVERY DAY, Disp: 90 tablet, Rfl: 1   Ascorbic Acid (VITAMIN C) POWD, Take 1,000 mg by mouth daily as needed., Disp: , Rfl:    aspirin 81 MG chewable tablet, Chew 81 mg by mouth daily., Disp: , Rfl:    baclofen  (LIORESAL ) 10 MG tablet, TAKE 1 TABLET (10 MG TOTAL) BY MOUTH AT  BEDTIME. AS NEEDED, Disp: 90 tablet, Rfl: 0   blood glucose meter kit and supplies, Dispense based on patient and insurance preference. Use up to four times daily as directed. (FOR ICD-10 E10.9, E11.9). Check fsbs up to twice daily, Disp: 1 each, Rfl: 0   Blood Glucose Monitoring Suppl (FREESTYLE LITE) w/Device KIT, Use as instructed to test blood sugar up to twice daily, Disp: 1 kit, Rfl: 0   Cholecalciferol 25 MCG (1000 UT) tablet, Take 1,000 Units by mouth in the morning and at bedtime., Disp: , Rfl:    Coenzyme Q10 (COQ10) 100 MG CAPS, Take 100 mg by mouth 2 (two) times daily., Disp: , Rfl:    Dapagliflozin  Pro-metFORMIN  ER (XIGDUO  XR) 05-999 MG TB24, Take 1 tablet by mouth daily., Disp: 90 tablet, Rfl: 3   diclofenac  Sodium (VOLTAREN ) 1 % GEL, Apply 2 g topically 4 (four) times daily., Disp: 100 g, Rfl: 2   ENBREL SURECLICK 50 MG/ML injection, Inject into the skin., Disp: , Rfl:    fluticasone  (FLONASE ) 50 MCG/ACT nasal spray, SHAKE LIQUID AND USE 2 SPRAYS IN EACH NOSTRIL DAILY, Disp: 48 mL, Rfl: 0  glucose blood (FREESTYLE LITE) test strip, USE AS INSTRUCTED TO TEST BLOOD SUGAR UP TO TWICE DAILY, Disp: 200 strip, Rfl: 1   hydroxychloroquine (PLAQUENIL) 200 MG tablet, Take 200 mg by mouth 2 (two) times daily., Disp: , Rfl:    KERENDIA 10 MG TABS, Take 1 tablet by mouth daily., Disp: , Rfl:    Lancets (FREESTYLE) lancets, Use as instructed to test blood sugar up to twice daily, Disp: 200 each, Rfl: 3   levocetirizine (XYZAL ) 5 MG tablet, TAKE 1 TABLET BY MOUTH EVERY EVENING, Disp: 90 tablet, Rfl: 1   Multiple Vitamin (MULTIVITAMIN WITH MINERALS) TABS tablet, Take 1 tablet by mouth daily., Disp: , Rfl:    polyethylene glycol powder (GLYCOLAX /MIRALAX ) 17 GM/SCOOP powder, Take 17 g by mouth daily as needed for mild constipation or moderate constipation., Disp: 850 g, Rfl: 0   rosuvastatin  (CRESTOR ) 40 MG tablet, TAKE 1 TABLET BY MOUTH EVERY DAY, Disp: 90 tablet, Rfl: 1   allopurinol  (ZYLOPRIM )  300 MG tablet, TAKE 1 TABLET(300 MG) BY MOUTH TWICE DAILY, Disp: 180 tablet, Rfl: 1   fluconazole  (DIFLUCAN ) 150 MG tablet, TAKE 1 TABLET BY MOUTH TWICE WEEKLY AND AS NEEDED, Disp: 24 tablet, Rfl: 0   montelukast  (SINGULAIR ) 10 MG tablet, Take 1 tablet (10 mg total) by mouth at bedtime., Disp: 90 tablet, Rfl: 1   omega-3 acid ethyl esters (LOVAZA ) 1 g capsule, Take 2 capsules (2 g total) by mouth 2 (two) times daily., Disp: 360 capsule, Rfl: 1   pioglitazone  (ACTOS ) 15 MG tablet, Take 1 tablet (15 mg total) by mouth daily., Disp: 90 tablet, Rfl: 1   pregabalin  (LYRICA ) 100 MG capsule, Take 1 capsule (100 mg total) by mouth 3 (three) times daily. One extra capsule with the pm dose, Disp: 270 capsule, Rfl: 1   Semaglutide , 2 MG/DOSE, 8 MG/3ML SOPN, Inject 2 mg as directed once a week., Disp: 9 mL, Rfl: 3  Allergies  Allergen Reactions   Ace Inhibitors Cough   Lipofen [Fenofibrate] Other (See Comments)    Localized drug reaction, blister on back   Penicillins Itching   Sulfa Antibiotics Itching    I personally reviewed active problem list, medication list, allergies, family history with the patient/caregiver today.   ROS  Ten systems reviewed and is negative except as mentioned in HPI    Objective Physical Exam VITALS: BP- 118/72 CONSTITUTIONAL: Patient appears well-developed and well-nourished. No distress. HEENT: Head atraumatic, normocephalic, neck supple. CARDIOVASCULAR: Normal rate, regular rhythm and normal heart sounds. No murmur heard. No BLE edema. PULMONARY: Effort normal and breath sounds normal. Lungs clear to auscultation. No respiratory distress. ABDOMINAL: There is no tenderness or distention. MUSCULOSKELETAL: Normal gait. Without gross motor or sensory deficit. PSYCHIATRIC: Patient has a normal mood and affect. Behavior is normal. Judgment and thought content normal. NEUROLOGICAL: Sensation intact.  Vitals:   02/28/24 0937  BP: 118/72  Pulse: 95  Resp: 16  SpO2:  98%  Weight: 220 lb 12.8 oz (100.2 kg)  Height: 5' 7 (1.702 m)    Body mass index is 34.58 kg/m.  Recent Results (from the past 2160 hours)  POCT glycosylated hemoglobin (Hb A1C)     Status: Abnormal   Collection Time: 02/28/24  9:41 AM  Result Value Ref Range   Hemoglobin A1C 7.2 (A) 4.0 - 5.6 %   HbA1c POC (<> result, manual entry)     HbA1c, POC (prediabetic range)     HbA1c, POC (controlled diabetic range)      Diabetic  Foot Exam:  Diabetic foot exam was performed with the following findings:   No deformities, ulcerations, or other skin breakdown Normal sensation of 10g monofilament Intact posterior tibialis and dorsalis pedis pulses      PHQ2/9:    02/28/2024    9:30 AM 06/24/2023    9:36 AM 02/18/2023   10:00 AM 10/15/2022   10:37 AM 08/13/2022   10:29 AM  Depression screen PHQ 2/9  Decreased Interest 0 0 0 0 0  Down, Depressed, Hopeless 0 0 0 0 0  PHQ - 2 Score 0 0 0 0 0  Altered sleeping  0 0 0 0  Tired, decreased energy  0 0 0 0  Change in appetite  0 0 0 0  Feeling bad or failure about yourself   0 0 0 0  Trouble concentrating  0 0 0 0  Moving slowly or fidgety/restless  0 0 0 0  Suicidal thoughts  0 0 0 0  PHQ-9 Score  0 0 0 0    phq 9 is negative  Fall Risk:    02/28/2024    9:30 AM 10/28/2023    9:39 AM 06/24/2023    9:35 AM 02/18/2023   10:00 AM 10/15/2022   10:36 AM  Fall Risk   Falls in the past year? 0 0 0 0 0  Number falls in past yr: 0 0   0  Injury with Fall? 0 0   0  Risk for fall due to : No Fall Risks No Fall Risks No Fall Risks No Fall Risks No Fall Risks  Follow up Falls evaluation completed Falls prevention discussed;Education provided;Falls evaluation completed Falls prevention discussed Falls prevention discussed Falls prevention discussed     Assessment & Plan Type 2 diabetes mellitus with diabetic nephropathy, proteinuria, hypertension, dyslipidemia, and obesity Type 2 diabetes with HbA1c at 7.2, likely due to stress and  inconsistent exercise. Nephrologist managing nephropathy. Lipid panel good. Blood pressure controlled. Slight weight decrease noted. - Continue Ozempic  2 mg, pioglitazone  50 mg, and Xigduo  05/999 mg. - Continue rosuvastatin . - Continue olmesartan . - Continue Kerendia as prescribed by nephrologist. - Encourage portion control and regular exercise. - Perform foot exam today. - Send Ozempic  prescription to [facility]. - Send other [facility]-related [facility] to [facility]. - Monitor potassium levels.  Recurrent yeast infection Recurrent yeast infections linked to diabetes medication.  - Prescribe fluconazole .  Gout Gout controlled with allopurinol  300 mg. No recent episodes. Normal GFR. - Continue allopurinol  300 mg. - Monitor uric acid levels.  Rheumatoid arthritis, multiple sites Rheumatoid arthritis with negative rheumatoid factor. Managed with Enbrel and hydroxychloroquine. - Continue Enbrel and hydroxychloroquine.  Osteoarthritis, multiple joints Osteoarthritis with ulnar nerve decompression. Tingling and numbness in arm. Hand closure limited by rheumatoid arthritis. - Continue pregabalin , increase to 100 mg three times a day.  Radiculopathy of upper limb Radiculopathy with arm tingling and numbness. Pregabalin  aids pain and tingling. - Increase pregabalin  to 100 mg three times a day.  Allergic rhinitis Allergic rhinitis managed with montelukast . - Prescribe montelukast  as needed.  Muscle spasms Muscle spasms managed with baclofen . - Continue baclofen  as needed.

## 2024-03-02 ENCOUNTER — Other Ambulatory Visit: Payer: Self-pay | Admitting: Family Medicine

## 2024-03-02 ENCOUNTER — Other Ambulatory Visit: Payer: Self-pay | Admitting: Pharmacist

## 2024-03-02 DIAGNOSIS — E1129 Type 2 diabetes mellitus with other diabetic kidney complication: Secondary | ICD-10-CM

## 2024-03-02 MED ORDER — DAPAGLIFLOZIN PRO-METFORMIN ER 10-1000 MG PO TB24: 1.0000 | ORAL_TABLET | Freq: Every day | ORAL | 1 refills | Status: DC

## 2024-03-02 NOTE — Progress Notes (Signed)
   03/02/2024  Patient ID: Maria Cruz, female   DOB: [birth year], 67 y.o.   MRN: [id number]  Patient enrolled in Ozempic  patient assistance program from [employer].  As of 7/25, Ozempic  will no longer be eligible for automatic refill from [employer].   Outreach to patient today and provide this update. Patient advises that she will no longer be receiving Ozempic  through assistance program as she now has [employer] subsidy.  Note patient also previously enrolled in patient assistance for Xigduo  ER 05-999 mg daily from [employer]. Advises that she will no longer need assistance with maintaining this enrollment - Requests PCP send future refills for Xigduo  to [facility]. Will send request to PCP  Sharyle Sia, PharmD, [facility] [phone]

## 2024-03-02 NOTE — Patient Instructions (Signed)
 Goals Addressed             This Visit's Progress    Pharmacy Goals       Our goal A1c is less than 7%. This corresponds with fasting sugars less than 130 and 2 hour after meal sugars less than 180. Please keep a log of your results when checking your blood sugar   Our goal bad cholesterol, or LDL, is less than 70 . This is why it is important to continue taking your rosuvastatin.  Check your blood pressure once- twice weekly, and any time you have concerning symptoms like headache, chest pain, dizziness, shortness of breath, or vision changes.   Our goal is less than 130/80.  To appropriately check your blood pressure, make sure you do the following:  1) Avoid caffeine, exercise, or tobacco products for 30 minutes before checking. Empty your bladder. 2) Sit with your back supported in a flat-backed chair. Rest your arm on something flat (arm of the chair, table, etc). 3) Sit still with your feet flat on the floor, resting, for at least 5 minutes.  4) Check your blood pressure. Take 1-2 readings.  5) Write down these readings and bring with you to any provider appointments.  Bring your home blood pressure machine with you to a provider's office for accuracy comparison at least once a year.   Make sure you take your blood pressure medications before you come to any office visit, even if you were asked to fast for labs.   Estelle Grumbles, PharmD, [facility] [phone]

## 2024-03-15 ENCOUNTER — Ambulatory Visit

## 2024-03-15 DIAGNOSIS — Z0000 Encounter for general adult medical examination without abnormal findings: Secondary | ICD-10-CM | POA: Diagnosis not present

## 2024-03-15 DIAGNOSIS — M25552 Pain in left hip: Secondary | ICD-10-CM | POA: Diagnosis not present

## 2024-03-15 DIAGNOSIS — R200 Anesthesia of skin: Secondary | ICD-10-CM | POA: Diagnosis not present

## 2024-03-15 DIAGNOSIS — G5621 Lesion of ulnar nerve, right upper limb: Secondary | ICD-10-CM | POA: Diagnosis not present

## 2024-03-15 DIAGNOSIS — R202 Paresthesia of skin: Secondary | ICD-10-CM | POA: Diagnosis not present

## 2024-03-15 NOTE — Progress Notes (Signed)
 Subjective:   Maria Cruz is a 68 y.o. who presents for a Medicare Wellness preventive visit.  As a reminder, Annual Wellness Visits don't include a physical exam, and some assessments may be limited, especially if this visit is performed virtually. We may recommend an in-person follow-up visit with your provider if needed.  Visit Complete: Virtual I connected with  Maria Cruz on 03/15/24 by a audio enabled telemedicine application and verified that I am speaking with the correct person using two identifiers.  Patient Location: Home  Provider Location: Home Office  I discussed the limitations of evaluation and management by telemedicine. The patient expressed understanding and agreed to proceed.  Vital Signs: Because this visit was a virtual/telehealth visit, some criteria may be missing or patient reported. Any vitals not documented were not able to be obtained and vitals that have been documented are patient reported.  VideoDeclined- This patient declined Librarian, academic. Therefore the visit was completed with audio only.  Persons Participating in Visit: Patient.  AWV Questionnaire: No: Patient Medicare AWV questionnaire was not completed prior to this visit.  Cardiac Risk Factors include: advanced age (>47men, >82 women);diabetes mellitus;hypertension;dyslipidemia;obesity (BMI >30kg/m2)     Objective:    There were no vitals filed for this visit. There is no height or weight on file to calculate BMI.     03/15/2024    4:07 PM 08/24/2022   11:15 AM 03/10/2021   10:20 AM 12/16/2020    9:33 AM 12/05/2020   10:33 AM 07/27/2017    7:24 AM 03/02/2017   10:29 AM  Advanced Directives  Does Patient Have a Medical Advance Directive? No No No No No No  No   Would patient like information on creating a medical advance directive? No - Patient declined No - Patient declined No - Patient declined No - Patient declined Yes (MAU/Ambulatory/Procedural  Areas - Information given) No - Patient declined       Data saved with a previous flowsheet row definition    Current Medications (verified) Outpatient Encounter Medications as of 03/15/2024  Medication Sig   acetaminophen  (TYLENOL ) 500 MG tablet Take 500 mg by mouth daily.   allopurinol  (ZYLOPRIM ) 300 MG tablet TAKE 1 TABLET(300 MG) BY MOUTH TWICE DAILY   amLODipine -olmesartan  (AZOR ) 5-40 MG tablet TAKE 1 TABLET BY MOUTH EVERY DAY   Ascorbic Acid (VITAMIN C) POWD Take 1,000 mg by mouth daily as needed.   aspirin 81 MG chewable tablet Chew 81 mg by mouth daily.   baclofen  (LIORESAL ) 10 MG tablet TAKE 1 TABLET (10 MG TOTAL) BY MOUTH AT BEDTIME. AS NEEDED   blood glucose meter kit and supplies Dispense based on patient and insurance preference. Use up to four times daily as directed. (FOR ICD-10 E10.9, E11.9). Check fsbs up to twice daily   Blood Glucose Monitoring Suppl (FREESTYLE LITE) w/Device KIT Use as instructed to test blood sugar up to twice daily   Cholecalciferol 25 MCG (1000 UT) tablet Take 1,000 Units by mouth in the morning and at bedtime.   Coenzyme Q10 (COQ10) 100 MG CAPS Take 100 mg by mouth 2 (two) times daily.   Dapagliflozin  Pro-metFORMIN  ER (XIGDUO  XR) 05-999 MG TB24 Take 1 tablet by mouth daily.   diclofenac  Sodium (VOLTAREN ) 1 % GEL Apply 2 g topically 4 (four) times daily.   ENBREL SURECLICK 50 MG/ML injection Inject into the skin.   fluconazole  (DIFLUCAN ) 150 MG tablet TAKE 1 TABLET BY MOUTH TWICE WEEKLY AND AS NEEDED  fluticasone  (FLONASE ) 50 MCG/ACT nasal spray SHAKE LIQUID AND USE 2 SPRAYS IN EACH NOSTRIL DAILY   glucose blood (FREESTYLE LITE) test strip USE AS INSTRUCTED TO TEST BLOOD SUGAR UP TO TWICE DAILY   hydroxychloroquine (PLAQUENIL) 200 MG tablet Take 200 mg by mouth 2 (two) times daily.   KERENDIA 10 MG TABS Take 1 tablet by mouth daily.   Lancets (FREESTYLE) lancets Use as instructed to test blood sugar up to twice daily   levocetirizine (XYZAL ) 5 MG  tablet TAKE 1 TABLET BY MOUTH EVERY EVENING   montelukast  (SINGULAIR ) 10 MG tablet Take 1 tablet (10 mg total) by mouth at bedtime.   Multiple Vitamin (MULTIVITAMIN WITH MINERALS) TABS tablet Take 1 tablet by mouth daily.   omega-3 acid ethyl esters (LOVAZA ) 1 g capsule Take 2 capsules (2 g total) by mouth 2 (two) times daily.   pioglitazone  (ACTOS ) 15 MG tablet Take 1 tablet (15 mg total) by mouth daily.   polyethylene glycol powder (GLYCOLAX /MIRALAX ) 17 GM/SCOOP powder Take 17 g by mouth daily as needed for mild constipation or moderate constipation.   pregabalin  (LYRICA ) 100 MG capsule Take 1 capsule (100 mg total) by mouth 3 (three) times daily. One extra capsule with the pm dose   rosuvastatin  (CRESTOR ) 40 MG tablet TAKE 1 TABLET BY MOUTH EVERY DAY   Semaglutide , 2 MG/DOSE, 8 MG/3ML SOPN Inject 2 mg as directed once a week.   No facility-administered encounter medications on file as of 03/15/2024.    Allergies (verified) Ace inhibitors, Lipofen [fenofibrate], Penicillins, and Sulfa antibiotics   History: Past Medical History:  Diagnosis Date   Allergy    Arthritis    Back pain with radiation    Blood transfusion without reported diagnosis March 2021   In operation   Calluse    Foot   Cataract March 2022   Colon polyp    Diabetes mellitus without complication (HCC) 2016   Type II    Fatty liver    GERD (gastroesophageal reflux disease)    History of total hip replacement, left 12/05/2019   [facility]   Hyperlipidemia    Hypertension    Lumbar radiculopathy    Dr. Marchia   Obesity    Plantar fasciitis, left    Vitamin D  deficiency    Past Surgical History:  Procedure Laterality Date   ABDOMINAL HYSTERECTOMY  2012   ANTERIOR INTEROSSEOUS NERVE DECOMPRESSION Right 12/16/2020   Procedure: Right ulnar nerve release, elbow;  Surgeon: Kathlynn Sharper, MD;  Location: [facility];  Service: Orthopedics;  Laterality: Right;   ANTERIOR  INTEROSSEOUS NERVE DECOMPRESSION Right 08/24/2022   Procedure: Repeat ulnar nerve release at elbow;  Surgeon: Kathlynn Sharper, MD;  Location: [facility];  Service: Orthopedics;  Laterality: Right;  Diabetic   APPENDECTOMY  2012   BREAST BIOPSY Right 07/12/2012   Negative   BREAST BIOPSY Left 05/09/2017   Affirm Bx of two areas-FIBROADENOMA WITH COARSE CALCIFICATION. NEGATIVE FOR ATYPIA,COLLAPSED CYST AND FIBROADENOMATOID CHANGE   COLONOSCOPY  2009   Dr Jinny   COLONOSCOPY WITH PROPOFOL  N/A 07/27/2017   Procedure: COLONOSCOPY WITH PROPOFOL ;  Surgeon: Dessa Reyes ORN, MD;  Location: [facility];  Service: Endoscopy;  Laterality: N/A;   DILATION AND CURETTAGE OF UTERUS  2007   JOINT REPLACEMENT  2021   REPLACEMENT TOTAL HIP W/  RESURFACING IMPLANTS Left 11/2019   TUBAL LIGATION     Family History  Problem Relation Age of Onset   Multiple sclerosis Mother  Heart attack Father    Kidney failure Sister    Heart attack Brother    Diabetes Daughter    CAD Sister    Arthritis Sister        RA   Lupus Sister    Breast cancer Neg Hx    Colon cancer Neg Hx    Social History   Socioeconomic History   Marital status: Single    Spouse name: Not on file   Number of children: 3   Years of education: Not on file   Highest education level: Bachelor's degree (e.g., BA, AB, BS)  Occupational History   Not on file  Tobacco Use   Smoking status: Never   Smokeless tobacco: Never  Vaping Use   Vaping status: Never Used  Substance and Sexual Activity   Alcohol use: No    Alcohol/week: 0.0 standard drinks of alcohol   Drug use: No   Sexual activity: Yes  Other Topics Concern   Not on file  Social History Narrative   Her grown daughter lives with her and her sons live in a house behind her    Social Drivers of Corporate investment banker Strain: Low Risk  (03/15/2024)   Overall Financial Resource Strain (CARDIA)    Difficulty of Paying Living Expenses: Not very hard   Recent Concern: Financial Resource Strain - Medium Risk (02/24/2024)   Overall Financial Resource Strain (CARDIA)    Difficulty of Paying Living Expenses: Somewhat hard  Food Insecurity: No Food Insecurity (03/15/2024)   Hunger Vital Sign    Worried About Running Out of Food in the Last Year: Never true    Ran Out of Food in the Last Year: Never true  Recent Concern: Food Insecurity - Food Insecurity Present (02/24/2024)   Hunger Vital Sign    Worried About Running Out of Food in the Last Year: Sometimes true    Ran Out of Food in the Last Year: Sometimes true  Transportation Needs: No Transportation Needs (03/15/2024)   PRAPARE - Administrator, Civil Service (Medical): No    Lack of Transportation (Non-Medical): No  Physical Activity: Sufficiently Active (03/15/2024)   Exercise Vital Sign    Days of Exercise per Week: 5 days    Minutes of Exercise per Session: 50 min  Stress: No Stress Concern Present (03/15/2024)   [employer] of Occupational Health - Occupational Stress Questionnaire    Feeling of Stress: Not at all  Social Connections: Moderately Integrated (03/15/2024)   Social Connection and Isolation Panel    Frequency of Communication with Friends and Family: More than three times a week    Frequency of Social Gatherings with Friends and Family: More than three times a week    Attends Religious Services: 1 to 4 times per year    Active Member of [employer] or Organizations: Yes    Attends Engineer, structural: More than 4 times per year    Marital Status: Never married    Tobacco Counseling Counseling given: Not Answered    Clinical Intake:  Pre-visit preparation completed: Yes  Pain : No/denies pain     BMI - recorded: 34.5 Nutritional Status: BMI > 30  Obese Nutritional Risks: None Diabetes: Yes CBG done?: No Did pt. bring in CBG monitor from home?: No  Lab Results  Component Value Date   HGBA1C 7.2 (A) 02/28/2024   HGBA1C 6.7 (A)  10/28/2023   HGBA1C 8.1 (A) 06/24/2023     How often  do you need to have someone help you when you read instructions, pamphlets, or other written materials from your doctor or pharmacy?: 1 - Never  Interpreter Needed?: No  Information entered by :: JHONNIE DAS, LPN   Activities of Daily Living    03/15/2024    4:09 PM 03/13/2024    8:43 AM  In your present state of health, do you have any difficulty performing the following activities:  Hearing? 0 0  Vision? 0 0  Difficulty concentrating or making decisions? 0 0  Walking or climbing stairs? 0 0  Dressing or bathing? 0 0  Doing errands, shopping? 0 0  Preparing Food and eating ? N N  Using the Toilet? N N  In the past six months, have you accidently leaked urine? N N  Do you have problems with loss of bowel control? N N  Managing your Medications? N N  Managing your Finances? N N  Housekeeping or managing your Housekeeping? N N    Patient Care Team: Sowles, Krichna, MD as PCP - General (Family Medicine) Alana Sharyle LABOR, RPH-CPP as Pharmacist Tobie, Mayur MARLA, MD as Consulting Physician (Rheumatology) Leora Lynwood SAUNDERS, MD as Referring Physician (Orthopedic Surgery) Lane Arthea BRAVO, MD as Referring Physician (Neurology) Pa, [facility] (Optometry)  I have updated your Care Teams any recent Medical Services you may have received from other providers in the past year.     Assessment:   This is a routine wellness examination for Addi.  Hearing/Vision screen Hearing Screening - Comments:: NO AIDS Vision Screening - Comments:: WEARS GLASSES AT NIGHT- [facility]- HAD APPT THIS YEAR   Goals Addressed             This Visit's Progress    DIET - EAT MORE FRUITS AND VEGETABLES       DIET - INCREASE WATER INTAKE         Depression Screen     03/15/2024    4:03 PM 02/28/2024    9:30 AM 06/24/2023    9:36 AM 02/18/2023   10:00 AM 10/15/2022   10:37 AM 08/13/2022   10:29 AM 06/04/2022    8:37 AM  PHQ 2/9  Scores  PHQ - 2 Score 0 0 0 0 0 0 0  PHQ- 9 Score 0  0 0 0 0 0    Fall Risk     03/15/2024    4:09 PM 03/13/2024    8:43 AM 02/28/2024    9:30 AM 10/28/2023    9:39 AM 06/24/2023    9:35 AM  Fall Risk   Falls in the past year? 0 1 0 0 0  Number falls in past yr: 0  0 0   Injury with Fall? 0  0 0   Risk for fall due to : No Fall Risks  No Fall Risks No Fall Risks No Fall Risks  Follow up Falls evaluation completed;Falls prevention discussed  Falls evaluation completed Falls prevention discussed;Education provided;Falls evaluation completed Falls prevention discussed    MEDICARE RISK AT HOME:  Medicare Risk at Home Any stairs in or around the home?: Yes If so, are there any without handrails?: No Home free of loose throw rugs in walkways, pet beds, electrical cords, etc?: Yes Adequate lighting in your home to reduce risk of falls?: Yes Life alert?: No Use of a cane, walker or w/c?: Yes (CANE SOMETIMES) Grab bars in the bathroom?: No Shower chair or bench in shower?: No Elevated toilet seat or a handicapped  toilet?: No  TIMED UP AND GO:  Was the test performed?  No  Cognitive Function: 6CIT completed        03/15/2024    4:11 PM  6CIT Screen  What Year? 0 points  What month? 0 points  What time? 0 points  Count back from 20 0 points  Months in reverse 0 points  Repeat phrase 0 points  Total Score 0 points    Immunizations Immunization History  Administered Date(s) Administered   Fluad Quad(high Dose 65+) 06/04/2022   Influenza, High Dose Seasonal PF 05/09/2023   Influenza,inj,Quad PF,6+ Mos 06/03/2021   Influenza,inj,quad, With Preservative 06/11/2019   Influenza-Unspecified 05/19/2015, 05/19/2016, 06/06/2019, 05/28/2020   PFIZER(Purple Top)SARS-COV-2 Vaccination 09/14/2019, 10/05/2019, 05/30/2020   PNEUMOCOCCAL CONJUGATE-20 06/04/2022   PPD Test 05/29/2021   Pfizer Covid-19 Vaccine Bivalent Booster 33yrs & up 07/01/2021, 06/15/2022   Pfizer(Comirnaty)Fall  Seasonal Vaccine 12 years and older 05/09/2023   Pneumococcal Conjugate-13 08/27/2015   Pneumococcal Polysaccharide-23 11/26/2009   RSV,unspecified 06/15/2022   Tdap 04/22/2008, 05/02/2018   Zoster Recombinant(Shingrix) 06/30/2020, 06/03/2021   Zoster, Live 04/13/2012    Screening Tests Health Maintenance  Topic Date Due   INFLUENZA VACCINE  03/09/2024   COVID-19 Vaccine (7 - Pfizer risk 2024-25 season) 03/15/2024 (Originally 11/06/2023)   HEMOGLOBIN A1C  08/30/2024   Diabetic kidney evaluation - eGFR measurement  10/27/2024   Diabetic kidney evaluation - Urine ACR  10/27/2024   OPHTHALMOLOGY EXAM  11/07/2024   FOOT EXAM  02/27/2025   MAMMOGRAM  10/24/2025   Colonoscopy  07/28/2027   DEXA SCAN  10/21/2027   DTaP/Tdap/Td (3 - Td or Tdap) 05/02/2028   Pneumococcal Vaccine: 50+ Years  Completed   Hepatitis C Screening  Completed   Zoster Vaccines- Shingrix  Completed   Hepatitis B Vaccines  Aged Out   HPV VACCINES  Aged Out   Meningococcal B Vaccine  Aged Out    Health Maintenance  Health Maintenance Due  Topic Date Due   INFLUENZA VACCINE  03/09/2024   Health Maintenance Items Addressed: UP TO DATE W/ SHOTS; MAMMOGRAM UP TO DATE; UP TO DATE ON COLONOSCOPY & BDS   Additional Screening:  Vision Screening: Recommended annual ophthalmology exams for early detection of glaucoma and other disorders of the eye. Would you like a referral to an eye doctor? No    Dental Screening: Recommended annual dental exams for proper oral hygiene  Community Resource Referral / Chronic Care Management: CRR required this visit?  No   CCM required this visit?  No   Plan:    I have personally reviewed and noted the following in the patient's chart:   Medical and social history Use of alcohol, tobacco or illicit drugs  Current medications and supplements including opioid prescriptions. Patient is not currently taking opioid prescriptions. Functional ability and status Nutritional  status Physical activity Advanced directives List of other physicians Hospitalizations, surgeries, and ER visits in previous 12 months Vitals Screenings to include cognitive, depression, and falls Referrals and appointments  In addition, I have reviewed and discussed with patient certain preventive protocols, quality metrics, and best practice recommendations. A written personalized care plan for preventive services as well as general preventive health recommendations were provided to patient.   Jhonnie GORMAN Das, LPN   08/10/7972   After Visit Summary: (MyChart) Due to this being a telephonic visit, the after visit summary with patients personalized plan was offered to patient via MyChart   Notes: Nothing significant to report at this time.

## 2024-03-15 NOTE — Patient Instructions (Signed)
 Ms. Kupfer , Thank you for taking time out of your busy schedule to complete your Annual Wellness Visit with me. I enjoyed our conversation and look forward to speaking with you again next year. I, as well as your care team,  appreciate your ongoing commitment to your health goals. Please review the following plan we discussed and let me know if I can assist you in the future.   Follow up Visits: 03/21/25 @ 2:40 PM BY PHONE We will see or speak with you next year for your Next Medicare AWV with our clinical staff Have you seen your provider in the last 6 months (3 months if uncontrolled diabetes)? Yes  Clinician Recommendations:  Aim for 30 minutes of exercise or brisk walking, 6-8 glasses of water, and 5 servings of fruits and vegetables each day. TAKE CARE!      This is a list of the screenings recommended for you:  Health Maintenance  Topic Date Due   Flu Shot  03/09/2024   COVID-19 Vaccine (7 - Pfizer risk 2024-25 season) 03/15/2024*   Hemoglobin A1C  08/30/2024   Yearly kidney function blood test for diabetes  10/27/2024   Yearly kidney health urinalysis for diabetes  10/27/2024   Eye exam for diabetics  11/07/2024   Complete foot exam   02/27/2025   Mammogram  10/24/2025   Colon Cancer Screening  07/28/2027   DEXA scan (bone density measurement)  10/21/2027   DTaP/Tdap/Td vaccine (3 - Td or Tdap) 05/02/2028   Pneumococcal Vaccine for age over 16  Completed   Hepatitis C Screening  Completed   Zoster (Shingles) Vaccine  Completed   Hepatitis B Vaccine  Aged Out   HPV Vaccine  Aged Out   Meningitis B Vaccine  Aged Out  *Topic was postponed. The date shown is not the original due date.    Advanced directives: (ACP Link)Information on Advanced Care Planning can be found at [place]  Secretary of [facility] Advance Health Care Directives Advance Health Care Directives. [email or web address]  Advance Care Planning is important because it:  [x]  Makes sure you receive the medical care that is  consistent with your values, goals, and preferences  [x]  It provides guidance to your family and loved ones and reduces their decisional burden about whether or not they are making the right decisions based on your wishes.  Follow the link provided in your after visit summary or read over the paperwork we have mailed to you to help you started getting your Advance Directives in place. If you need assistance in completing these, please reach out to us  so that we can help you!

## 2024-04-03 ENCOUNTER — Telehealth: Payer: Self-pay

## 2024-04-03 NOTE — Telephone Encounter (Signed)
 Completed reorder from for [employer] for Ozempic  and faxed to Dr. Dorette Loron to review and sign and return to [employer] at [phone]

## 2024-05-01 ENCOUNTER — Other Ambulatory Visit: Payer: Self-pay | Admitting: Family Medicine

## 2024-05-01 ENCOUNTER — Telehealth: Payer: Self-pay

## 2024-05-01 DIAGNOSIS — B3731 Acute candidiasis of vulva and vagina: Secondary | ICD-10-CM

## 2024-05-01 DIAGNOSIS — E1169 Type 2 diabetes mellitus with other specified complication: Secondary | ICD-10-CM

## 2024-05-01 DIAGNOSIS — M62830 Muscle spasm of back: Secondary | ICD-10-CM

## 2024-05-01 DIAGNOSIS — J3089 Other allergic rhinitis: Secondary | ICD-10-CM

## 2024-05-01 NOTE — Telephone Encounter (Signed)
 Left message with patient regarding pharmacy appointment scheduling. Patient is scheduled with clinical pharmacist, Almarie, on 05/16/24. Patient will be transitioning to me after that time.  Jeannelle Wiens E. Marsh, PharmD Clinical Pharmacist [facility] [phone]

## 2024-05-16 ENCOUNTER — Telehealth: Payer: Self-pay | Admitting: Pharmacist

## 2024-05-16 ENCOUNTER — Other Ambulatory Visit: Payer: Self-pay | Admitting: Pharmacist

## 2024-05-16 NOTE — Progress Notes (Signed)
   Outreach Note  05/16/2024 Name: Maria Cruz MRN: [id number] DOB: 03/30/1956  Referred by: Sowles, Krichna, MD  Was unable to reach patient via telephone today and have left HIPAA compliant voicemail asking patient to return my call.    Follow Up Plan: Will collaborate with Care Guide to outreach to schedule follow up with clinic pharmacist  Sharyle Sia, PharmD, [facility] [phone]

## 2024-05-24 ENCOUNTER — Telehealth: Payer: Self-pay

## 2024-05-24 NOTE — Telephone Encounter (Addendum)
   05/24/2024 Name: Maria Cruz MRN: [id number] DOB: April 26, 1956  Patient enrolled in Ozempic  patient assistance program from [employer] through 08/08/2024   [employer] has announced that they will no longer offer Ozempic  to [employer] beneficiaries through their Patient Assistance Program in 2026.    Outreach to patient today and provide this update. Voicemail full at this time. Patient is scheduled for follow up with clinical pharmacist on 06/05/24. It appears that Mounjaro is $0 on insurance. Will discuss at follow up   Will also share this information with patient via MyChart message  Shalie Schremp E. Marsh, PharmD Clinical Pharmacist [facility] [phone]

## 2024-05-28 ENCOUNTER — Other Ambulatory Visit: Payer: Self-pay | Admitting: Family Medicine

## 2024-05-28 ENCOUNTER — Telehealth: Payer: Self-pay

## 2024-05-28 ENCOUNTER — Other Ambulatory Visit (HOSPITAL_COMMUNITY): Payer: Self-pay

## 2024-05-28 DIAGNOSIS — R8290 Unspecified abnormal findings in urine: Secondary | ICD-10-CM | POA: Diagnosis not present

## 2024-05-28 DIAGNOSIS — J3089 Other allergic rhinitis: Secondary | ICD-10-CM

## 2024-05-28 DIAGNOSIS — M62830 Muscle spasm of back: Secondary | ICD-10-CM

## 2024-05-28 DIAGNOSIS — M150 Primary generalized (osteo)arthritis: Secondary | ICD-10-CM | POA: Diagnosis not present

## 2024-05-28 DIAGNOSIS — E1169 Type 2 diabetes mellitus with other specified complication: Secondary | ICD-10-CM

## 2024-05-28 DIAGNOSIS — M0609 Rheumatoid arthritis without rheumatoid factor, multiple sites: Secondary | ICD-10-CM | POA: Diagnosis not present

## 2024-05-28 DIAGNOSIS — B3731 Acute candidiasis of vulva and vagina: Secondary | ICD-10-CM

## 2024-05-28 DIAGNOSIS — Z79899 Other long term (current) drug therapy: Secondary | ICD-10-CM | POA: Diagnosis not present

## 2024-05-28 NOTE — Telephone Encounter (Signed)
 Received APPROVAL letter from [employer] on [employer]  thru 08/08/2025,approval letter index

## 2024-06-05 ENCOUNTER — Ambulatory Visit (INDEPENDENT_AMBULATORY_CARE_PROVIDER_SITE_OTHER)

## 2024-06-05 DIAGNOSIS — Z7985 Long-term (current) use of injectable non-insulin antidiabetic drugs: Secondary | ICD-10-CM

## 2024-06-05 DIAGNOSIS — Z7984 Long term (current) use of oral hypoglycemic drugs: Secondary | ICD-10-CM

## 2024-06-05 DIAGNOSIS — E1169 Type 2 diabetes mellitus with other specified complication: Secondary | ICD-10-CM

## 2024-06-05 DIAGNOSIS — E1129 Type 2 diabetes mellitus with other diabetic kidney complication: Secondary | ICD-10-CM | POA: Diagnosis not present

## 2024-06-05 DIAGNOSIS — R809 Proteinuria, unspecified: Secondary | ICD-10-CM | POA: Diagnosis not present

## 2024-06-05 DIAGNOSIS — E785 Hyperlipidemia, unspecified: Secondary | ICD-10-CM

## 2024-06-05 NOTE — Progress Notes (Signed)
 S:     Reason for visit: ?  Maria Cruz is a 68 y.o. female with a history of diabetes (type 2), who presents today for an initial diabetes Face to Face pharmacotherapy visit.? Pertinent PMH also includes HTN, RA,, HLD, obesity, GERD.   Care Team: Primary Care Provider: Sowles, Krichna, MD  Today, she reports she has been staying with her daughter at the hospital since last Wednesday.  Current diabetes medications include: Xigduo  05/999 mg daily, pioglitazone  15 mg daily, Ozempic  2 mg weekly Current hypertension medications include: amlodipine /olmesartan  5/40 mg daily  Current hyperlipidemia medications include: rosuvastatin  40 mg daily, Lovaza  2 g BID  Patient reports adherence to taking all medications as prescribed.   Have you been experiencing any side effects to the medications prescribed? no Do you have any problems obtaining medications due to transportation or finances? no Insurance coverage: [facility] Medicare and LIS supplement  Patient denies hypoglycemic events.  Reported home fasting blood sugars: not checking  Patient reports nocturia 1-2 times per night. Patient reports visual changes, mainly with driving at night.  Patient-reported exercise habits: none reported; plans to begin walking around the neighborhood with her daughter DM Prevention:  Statin: Taking; high intensity.?  History of albuminuria? yes Last eye exam:  Lab Results  Component Value Date   HMDIABEYEEXA No Retinopathy 11/08/2023   Tobacco Use:  Tobacco Use: Low Risk  (05/28/2024)   Received from [facility]   Patient History    Smoking Tobacco Use: Never    Smokeless Tobacco Use: Never    Passive Exposure: Never   O:  Vitals:  Wt Readings from Last 3 Encounters:  02/28/24 220 lb 12.8 oz (100.2 kg)  10/28/23 222 lb 9.6 oz (101 kg)  06/24/23 218 lb 9.6 oz (99.2 kg)   BP Readings from Last 3 Encounters:  02/28/24 118/72  10/28/23 136/78  06/24/23 118/82   Pulse  Readings from Last 3 Encounters:  02/28/24 95  10/28/23 99  06/24/23 94     Labs:?  Lab Results  Component Value Date   HGBA1C 7.2 (A) 02/28/2024   HGBA1C 6.7 (A) 10/28/2023   HGBA1C 8.1 (A) 06/24/2023   GLUCOSE 102 (H) 10/28/2023   MICRALBCREAT 396 (H) 10/28/2023   MICRALBCREAT 422 (H) 10/15/2022   MICRALBCREAT 783 02/01/2022   CREATININE 0.79 10/28/2023   CREATININE 0.57 10/15/2022   CREATININE 0.8 02/01/2022    Lab Results  Component Value Date   CHOL 116 10/28/2023   LDLCALC 44 10/28/2023   LDLCALC 56 10/15/2022   LDLCALC 40 12/07/2021   HDL 51 10/28/2023   TRIG 115 10/28/2023   TRIG 177 (H) 10/15/2022   TRIG 117 12/07/2021   ALT 26 10/28/2023   ALT 30 10/15/2022   AST 29 10/28/2023   AST 35 10/15/2022      Chemistry      Component Value Date/Time   NA 143 10/28/2023 1033   K 4.3 10/28/2023 1033   CL 103 10/28/2023 1033   CO2 25 10/28/2023 1033   BUN 12 10/28/2023 1033   CREATININE 0.79 10/28/2023 1033   GLU 185 02/01/2022 0000      Component Value Date/Time   CALCIUM  10.1 10/28/2023 1033   ALKPHOS 77 10/28/2023 1033   AST 29 10/28/2023 1033   ALT 26 10/28/2023 1033   BILITOT 0.4 10/28/2023 1033       The ASCVD Risk score (Arnett DK, et al., 2019) failed to calculate for the following reasons:  The valid total cholesterol range is 130 to 320 mg/dL  Lab Results  Component Value Date   MICRALBCREAT 396 (H) 10/28/2023   MICRALBCREAT 422 (H) 10/15/2022   MICRALBCREAT 783 02/01/2022   MICRALBCREAT 1,642 (H) 12/07/2021   MICRALBCREAT 1,531 (H) 11/29/2019   MICRALBCREAT 720 (H) 09/03/2019   MICRALBCREAT 403 (H) 06/05/2018   MICRALBCREAT 394.0 (H) 12/08/2017    A/P: Diabetes currently at goal of <7-7.5% based on age with a most recent A1c of 7.2% on 02/28/24.  Patient is able to verbalize appropriate hypoglycemia management plan. Medication adherence appears appropriate. Patient was recently approved for Low Income Subsidy and will no longer need  to receive patient assistance. However, Mounjaro is preferred on insurance over Ozempic . Patient still has a little over 2 months of Ozempic  from [employer] company on hand. Will discuss switching to Mounjaro at follow up. -Continued GLP-1 Ozempic  (semaglutide ) 2 mg weekly. Will need to switch to Mounjaro at follow up.  -Continued Xigduo  ER 05/999 mg daily.  -Continued pioglitazone  15 mg daily  -Extensively discussed pathophysiology of diabetes, recommended lifestyle interventions, dietary effects on blood sugar control.  -Counseled on s/sx of and management of hypoglycemia.  -Counseled for patient to begin monitoring fasting BG 2-3x per week.   ASCVD risk - primary prevention in patient with diabetes. Last LDL is 44 mg/dL, at goal of <29 mg/dL.  -Continued rosuvastatin  40 mg daily.  -Continued Lovaza  2 g BID  Patient verbalized understanding of treatment plan. Total time patient counseling 30 minutes.  Follow-up:  Pharmacist on 08/20/24 PCP clinic visit on 07/02/24  Peyton CHARLENA Ferries, PharmD Clinical Pharmacist [facility] [phone]

## 2024-06-15 ENCOUNTER — Other Ambulatory Visit (HOSPITAL_COMMUNITY): Payer: Self-pay

## 2024-06-18 ENCOUNTER — Telehealth: Payer: Self-pay

## 2024-06-18 NOTE — Telephone Encounter (Signed)
 Was open by mistake already have an open encounter

## 2024-06-18 NOTE — Telephone Encounter (Signed)
 Received following communication from [employer], pt has been approved, but is requiring new Xigduo  script be sent to [employer]

## 2024-06-19 MED ORDER — DAPAGLIFLOZIN PRO-METFORMIN ER 10-1000 MG PO TB24: 1.0000 | ORAL_TABLET | Freq: Every day | ORAL | 1 refills | Status: DC

## 2024-06-19 NOTE — Addendum Note (Signed)
 Addended by: YVONE PIERCE C on: 06/19/2024 10:00 AM   Modules accepted: Orders

## 2024-07-02 ENCOUNTER — Other Ambulatory Visit: Payer: Self-pay | Admitting: Family Medicine

## 2024-07-02 ENCOUNTER — Encounter: Payer: Self-pay | Admitting: Family Medicine

## 2024-07-02 ENCOUNTER — Ambulatory Visit: Admitting: Family Medicine

## 2024-07-02 VITALS — BP 112/76 | HR 83 | Resp 16 | Ht 67.0 in | Wt 234.4 lb

## 2024-07-02 DIAGNOSIS — M109 Gout, unspecified: Secondary | ICD-10-CM

## 2024-07-02 DIAGNOSIS — E1169 Type 2 diabetes mellitus with other specified complication: Secondary | ICD-10-CM | POA: Diagnosis not present

## 2024-07-02 DIAGNOSIS — Z9889 Other specified postprocedural states: Secondary | ICD-10-CM

## 2024-07-02 DIAGNOSIS — G5621 Lesion of ulnar nerve, right upper limb: Secondary | ICD-10-CM

## 2024-07-02 DIAGNOSIS — E669 Obesity, unspecified: Secondary | ICD-10-CM | POA: Diagnosis not present

## 2024-07-02 DIAGNOSIS — J3089 Other allergic rhinitis: Secondary | ICD-10-CM

## 2024-07-02 DIAGNOSIS — Z7984 Long term (current) use of oral hypoglycemic drugs: Secondary | ICD-10-CM

## 2024-07-02 DIAGNOSIS — M150 Primary generalized (osteo)arthritis: Secondary | ICD-10-CM

## 2024-07-02 DIAGNOSIS — M62830 Muscle spasm of back: Secondary | ICD-10-CM

## 2024-07-02 DIAGNOSIS — M0609 Rheumatoid arthritis without rheumatoid factor, multiple sites: Secondary | ICD-10-CM

## 2024-07-02 DIAGNOSIS — E1129 Type 2 diabetes mellitus with other diabetic kidney complication: Secondary | ICD-10-CM | POA: Diagnosis not present

## 2024-07-02 DIAGNOSIS — E119 Type 2 diabetes mellitus without complications: Secondary | ICD-10-CM

## 2024-07-02 DIAGNOSIS — I10 Essential (primary) hypertension: Secondary | ICD-10-CM

## 2024-07-02 LAB — POCT GLYCOSYLATED HEMOGLOBIN (HGB A1C): Hemoglobin A1C: 6.6 % — AB (ref 4.0–5.6)

## 2024-07-02 MED ORDER — ROSUVASTATIN CALCIUM 40 MG PO TABS: 40.0000 mg | ORAL_TABLET | Freq: Every day | ORAL | 1 refills | Status: AC

## 2024-07-02 MED ORDER — DAPAGLIFLOZIN PRO-METFORMIN ER 10-1000 MG PO TB24: 1.0000 | ORAL_TABLET | Freq: Every day | ORAL | 1 refills | Status: DC

## 2024-07-02 MED ORDER — PREGABALIN 100 MG PO CAPS: 100.0000 mg | ORAL_CAPSULE | Freq: Three times a day (TID) | ORAL | 1 refills | Status: AC

## 2024-07-02 MED ORDER — AMLODIPINE-OLMESARTAN 5-40 MG PO TABS: 1.0000 | ORAL_TABLET | Freq: Every day | ORAL | 1 refills | Status: AC

## 2024-07-02 MED ORDER — ALLOPURINOL 300 MG PO TABS: ORAL_TABLET | ORAL | 1 refills | Status: AC

## 2024-07-02 MED ORDER — BACLOFEN 10 MG PO TABS: 10.0000 mg | ORAL_TABLET | Freq: Every day | ORAL | 0 refills | Status: AC

## 2024-07-02 MED ORDER — MONTELUKAST SODIUM 10 MG PO TABS: 10.0000 mg | ORAL_TABLET | Freq: Every day | ORAL | 1 refills | Status: AC

## 2024-07-02 MED ORDER — PIOGLITAZONE HCL 15 MG PO TABS: 15.0000 mg | ORAL_TABLET | Freq: Every day | ORAL | 1 refills | Status: AC

## 2024-07-02 MED ORDER — OMEGA-3-ACID ETHYL ESTERS 1 G PO CAPS: 2.0000 | ORAL_CAPSULE | Freq: Two times a day (BID) | ORAL | 1 refills | Status: AC

## 2024-07-02 NOTE — Progress Notes (Signed)
 Name: Maria Cruz   MRN: [id number]    DOB: 02/05/1956   Date:07/02/2024       Progress Note  Subjective  Chief Complaint  Chief Complaint  Patient presents with   Medical Management of Chronic Issues   Discussed the use of AI scribe software for clinical note transcription with the patient, who gave verbal consent to proceed.  History of Present Illness Maria Cruz is a 68 year old female with type 2 diabetes, rheumatoid arthritis, and cubital tunnel syndrome who presents for a regular follow-up visit.  She is four months post her last appointment in July and recently received a high-dose flu shot on October 17th.  She has a history of cubital tunnel syndrome in the right hand, following surgery on the right arm. She uses a glove and an arm brace at night, which provides some relief. An EMG showed moderate right ulnar neuropathy at the elbow and mild generalized polyneuropathy. She takes pregabalin  100 mg in the morning and 200 mg at night, which helps manage her symptoms. She is not dropping things and continues to experience symptoms despite previous surgery.  Her type 2 diabetes is managed with Xigduo  05/999 mg once daily, pioglitazone  15 mg, and Ozempic  2 mg daily. Her A1c was 7.2% in July and is 6.6% currently. She monitors her blood sugar at home, which is around 109-110 mg/dL. She has increased water intake and urination by choice, not due to excessive thirst.  She has rheumatoid arthritis with negative rheumatoid factors and osteoarthritis, primarily affecting the right side and the little finger. She takes Plaquenil 200 mg and receives Enbrel injections, which have significantly improved her joint stiffness and pain, allowing her to exercise and enjoy life more. She also takes allopurinol  300 mg twice daily for gout, with good kidney function as of her last check in March.  She has dyslipidemia associated with diabetes, managed with rosuvastatin  40 mg and Lovaza  (fish oil)  two capsules twice daily. No muscle aches from these medications.  She experiences perineal allergic rhinitis, which is improving but still causes nighttime congestion. She uses a nasal spray for relief.  Her weight has increased by 13 pounds since July, now at 234 pounds.  She takes amlodipine /olmesartan  for hypertension, with a current blood pressure of 112/76 mmHg. No chest pain, palpitations, or dizziness.  She uses baclofen  as needed for back muscle spasms, which occur occasionally.    Patient Active Problem List   Diagnosis Date Noted   History of decompression of ulnar nerve 06/04/2022   Dyslipidemia associated with type 2 diabetes mellitus (HCC) 06/04/2022   Cubital tunnel syndrome, right 07/16/2020   Meralgia paraesthetica, left 07/16/2020   Ulnar neuropathy at elbow, right 07/16/2020   Numbness and tingling 06/10/2020   Encounter for long-term (current) use of high-risk medication 06/02/2020   LFT elevation 06/02/2020   Osteoarthritis of spine with radiculopathy, cervical region 06/02/2020   Primary osteoarthritis involving multiple joints 06/02/2020   Rheumatoid arthritis of multiple sites with negative rheumatoid factor (HCC) 06/02/2020   Hip pain 03/27/2020   Osteoarthritis 03/27/2020   Sinus tachycardia 03/27/2020   History of total hip arthroplasty 12/18/2019   Proteinuria, unspecified 10/10/2019   Microcalcification of left breast on mammogram 05/03/2017   Herniated intervertebral disc of lumbar spine 09/02/2016   Spondylolisthesis of lumbar region 09/02/2016   Type 2 diabetes mellitus with albuminuria (HCC) 02/24/2015   Vitamin D  deficiency 02/24/2015   Perennial allergic rhinitis 02/24/2015   Hypercalcemia 02/24/2015  GERD without esophagitis 02/24/2015   Hypertension, benign 02/24/2015   Controlled gout 02/24/2015   Fatty liver 02/24/2015   Callus of foot 02/24/2015   Primary osteoarthritis of right hip 02/24/2015   History of carpal tunnel syndrome  02/24/2015   Obesity (BMI 30-39.9) 02/24/2015   Umbilical hernia without obstruction or gangrene 02/24/2015   Intermittent low back pain 02/24/2015   History of colonic polyps 02/24/2015   Dyslipidemia 02/24/2015    Past Surgical History:  Procedure Laterality Date   ABDOMINAL HYSTERECTOMY  2012   ANTERIOR INTEROSSEOUS NERVE DECOMPRESSION Right 12/16/2020   Procedure: Right ulnar nerve release, elbow;  Surgeon: Kathlynn Sharper, MD;  Location: [facility];  Service: Orthopedics;  Laterality: Right;   ANTERIOR INTEROSSEOUS NERVE DECOMPRESSION Right 08/24/2022   Procedure: Repeat ulnar nerve release at elbow;  Surgeon: Kathlynn Sharper, MD;  Location: [facility];  Service: Orthopedics;  Laterality: Right;  Diabetic   APPENDECTOMY  2012   BREAST BIOPSY Right 07/12/2012   Negative   BREAST BIOPSY Left 05/09/2017   Affirm Bx of two areas-FIBROADENOMA WITH COARSE CALCIFICATION. NEGATIVE FOR ATYPIA,COLLAPSED CYST AND FIBROADENOMATOID CHANGE   COLONOSCOPY  2009   Dr Jinny   COLONOSCOPY WITH PROPOFOL  N/A 07/27/2017   Procedure: COLONOSCOPY WITH PROPOFOL ;  Surgeon: Dessa Reyes ORN, MD;  Location: [facility];  Service: Endoscopy;  Laterality: N/A;   DILATION AND CURETTAGE OF UTERUS  2007   JOINT REPLACEMENT  2021   REPLACEMENT TOTAL HIP W/  RESURFACING IMPLANTS Left 11/2019   TUBAL LIGATION      Family History  Problem Relation Age of Onset   Multiple sclerosis Mother    Heart attack Father    Kidney failure Sister    Heart attack Brother    Diabetes Daughter    CAD Sister    Arthritis Sister        RA   Lupus Sister    Breast cancer Neg Hx    Colon cancer Neg Hx     Social History   Tobacco Use   Smoking status: Never   Smokeless tobacco: Never  Substance Use Topics   Alcohol use: No    Alcohol/week: 0.0 standard drinks of alcohol     Current Outpatient Medications:    acetaminophen  (TYLENOL ) 500 MG tablet, Take 500 mg by mouth daily., Disp: , Rfl:    allopurinol   (ZYLOPRIM ) 300 MG tablet, TAKE 1 TABLET(300 MG) BY MOUTH TWICE DAILY, Disp: 180 tablet, Rfl: 1   amLODipine -olmesartan  (AZOR ) 5-40 MG tablet, TAKE 1 TABLET BY MOUTH EVERY DAY, Disp: 90 tablet, Rfl: 1   Ascorbic Acid (VITAMIN C) POWD, Take 1,000 mg by mouth daily as needed., Disp: , Rfl:    aspirin 81 MG chewable tablet, Chew 81 mg by mouth daily., Disp: , Rfl:    baclofen  (LIORESAL ) 10 MG tablet, TAKE 1 TABLET (10 MG TOTAL) BY MOUTH AT BEDTIME. AS NEEDED, Disp: 90 tablet, Rfl: 0   blood glucose meter kit and supplies, Dispense based on patient and insurance preference. Use up to four times daily as directed. (FOR ICD-10 E10.9, E11.9). Check fsbs up to twice daily, Disp: 1 each, Rfl: 0   Blood Glucose Monitoring Suppl (FREESTYLE LITE) w/Device KIT, Use as instructed to test blood sugar up to twice daily, Disp: 1 kit, Rfl: 0   Cholecalciferol 25 MCG (1000 UT) tablet, Take 1,000 Units by mouth in the morning and at bedtime., Disp: , Rfl:    Coenzyme Q10 (COQ10) 100 MG CAPS, Take 100 mg  by mouth 2 (two) times daily., Disp: , Rfl:    Dapagliflozin  Pro-metFORMIN  ER (XIGDUO  XR) 05-999 MG TB24, Take 1 tablet by mouth daily., Disp: 90 tablet, Rfl: 1   diclofenac  Sodium (VOLTAREN ) 1 % GEL, Apply 2 g topically 4 (four) times daily., Disp: 100 g, Rfl: 2   ENBREL SURECLICK 50 MG/ML injection, Inject into the skin., Disp: , Rfl:    fluticasone  (FLONASE ) 50 MCG/ACT nasal spray, SHAKE LIQUID AND USE 2 SPRAYS IN EACH NOSTRIL DAILY, Disp: 48 mL, Rfl: 0   glucose blood (FREESTYLE LITE) test strip, USE AS INSTRUCTED TO TEST BLOOD SUGAR UP TO TWICE DAILY, Disp: 200 strip, Rfl: 1   hydroxychloroquine (PLAQUENIL) 200 MG tablet, Take 200 mg by mouth 2 (two) times daily., Disp: , Rfl:    KERENDIA 10 MG TABS, Take 1 tablet by mouth daily., Disp: , Rfl:    Lancets (FREESTYLE) lancets, Use as instructed to test blood sugar up to twice daily, Disp: 200 each, Rfl: 3   levocetirizine (XYZAL ) 5 MG tablet, TAKE 1 TABLET BY MOUTH  EVERY EVENING, Disp: 90 tablet, Rfl: 1   montelukast  (SINGULAIR ) 10 MG tablet, Take 1 tablet (10 mg total) by mouth at bedtime., Disp: 90 tablet, Rfl: 1   Multiple Vitamin (MULTIVITAMIN WITH MINERALS) TABS tablet, Take 1 tablet by mouth daily., Disp: , Rfl:    omega-3 acid ethyl esters (LOVAZA ) 1 g capsule, Take 2 capsules (2 g total) by mouth 2 (two) times daily., Disp: 360 capsule, Rfl: 1   pioglitazone  (ACTOS ) 15 MG tablet, Take 1 tablet (15 mg total) by mouth daily., Disp: 90 tablet, Rfl: 1   polyethylene glycol powder (GLYCOLAX /MIRALAX ) 17 GM/SCOOP powder, Take 17 g by mouth daily as needed for mild constipation or moderate constipation., Disp: 850 g, Rfl: 0   pregabalin  (LYRICA ) 100 MG capsule, Take 1 capsule (100 mg total) by mouth 3 (three) times daily. One extra capsule with the pm dose, Disp: 270 capsule, Rfl: 1   rosuvastatin  (CRESTOR ) 40 MG tablet, TAKE 1 TABLET BY MOUTH EVERY DAY, Disp: 90 tablet, Rfl: 0   Semaglutide , 2 MG/DOSE, 8 MG/3ML SOPN, Inject 2 mg as directed once a week., Disp: 9 mL, Rfl: 3  Allergies  Allergen Reactions   Ace Inhibitors Cough   Lipofen [Fenofibrate] Other (See Comments)    Localized drug reaction, blister on back   Penicillins Itching    Other Reaction(s): Not available   Sulfa Antibiotics Itching    I personally reviewed active problem list, medication list, allergies with the patient/caregiver today.   ROS  Ten systems reviewed and is negative except as mentioned in HPI    Objective Physical Exam  CONSTITUTIONAL: Patient appears well-developed and well-nourished.  No distress. HEENT: Head atraumatic, normocephalic, neck supple. CARDIOVASCULAR: Normal rate, regular rhythm and normal heart sounds.  No murmur heard. No BLE edema. PULMONARY: Effort normal and breath sounds normal. No respiratory distress. ABDOMINAL: There is no tenderness or distention. MUSCULOSKELETAL: Normal gait. Deformity of right 5 th DIP PSYCHIATRIC: Patient has a  normal mood and affect. behavior is normal. Judgment and thought content normal.  Vitals:   07/02/24 1004  BP: 112/76  Pulse: 83  Resp: 16  SpO2: 96%  Weight: 234 lb 6.4 oz (106.3 kg)  Height: 5' 7 (1.702 m)    Body mass index is 36.71 kg/m.  Recent Results (from the past 2160 hours)  POCT glycosylated hemoglobin (Hb A1C)     Status: Abnormal   Collection Time: 07/02/24 10:16  AM  Result Value Ref Range   Hemoglobin A1C 6.6 (A) 4.0 - 5.6 %   HbA1c POC (<> result, manual entry)     HbA1c, POC (prediabetic range)     HbA1c, POC (controlled diabetic range)      Diabetic Foot Exam:     PHQ2/9:    07/02/2024    9:57 AM 03/15/2024    4:03 PM 02/28/2024    9:30 AM 06/24/2023    9:36 AM 02/18/2023   10:00 AM  Depression screen PHQ 2/9  Decreased Interest 0 0 0 0 0  Down, Depressed, Hopeless 0 0 0 0 0  PHQ - 2 Score 0 0 0 0 0  Altered sleeping  0  0 0  Tired, decreased energy  0  0 0  Change in appetite  0  0 0  Feeling bad or failure about yourself   0  0 0  Trouble concentrating  0  0 0  Moving slowly or fidgety/restless  0  0 0  Suicidal thoughts  0  0 0  PHQ-9 Score  0   0  0   Difficult doing work/chores  Not difficult at all        Data saved with a previous flowsheet row definition    phq 9 is negative  Fall Risk:    07/02/2024    9:57 AM 03/15/2024    4:09 PM 03/13/2024    8:43 AM 02/28/2024    9:30 AM 10/28/2023    9:39 AM  Fall Risk   Falls in the past year? 0 0 1 0 0  Number falls in past yr: 0 0  0 0  Injury with Fall? 0 0  0 0  Risk for fall due to : No Fall Risks No Fall Risks  No Fall Risks No Fall Risks  Follow up Falls evaluation completed Falls evaluation completed;Falls prevention discussed  Falls evaluation completed Falls prevention discussed;Education provided;Falls evaluation completed      Assessment & Plan Type 2 diabetes mellitus with diabetic kidney disease and dyslipidemia Improved glycemic control with A1c reduced to 6.6%. Diabetic  kidney disease with proteinuria and microalbuminuria. Dyslipidemia managed with rosuvastatin  and Lovasa. No muscle weakness with rosuvastatin . Blood pressure controlled at 112/76 mmHg. - Continue Xigduo  05/999 mg once daily. - Continue pioglitazone  15 mg daily. - Continue Ozempic  2 mg daily. - Continue rosuvastatin  40 mg daily. - Continue Lovasa (fish oil) 2 capsules twice daily. - Continue amlodipine /olmesartan  combination for blood pressure. - Sent prescriptions to [facility].  Obesity, HTN, diabetes syndrome Weight increased by 13 pounds since July, now 234 pounds. Discussed weight management to prevent joint pain and complications. - Encouraged increased physical activity, such as walking. - Advised dietary modifications to include more protein and reduce sweets and carbohydrates.  Morbid obesity BMI over 35 with associated DM Discussed ways to lose weight   Essential hypertension Blood pressure well-controlled at 112/76 mmHg with current medication regimen. - Continue amlodipine /olmesartan  combination for blood pressure.  Rheumatoid arthritis, multiple sites, seronegative and primary generalized osteoarthritis Rheumatoid arthritis managed with Plaquenil and Enbrel injections. Significant improvement in joint stiffness and exercise ability. Osteoarthritis with pain in right little finger. - Continue Plaquenil 200 mg daily. - Continue Enbrel injections. - Follow up with rheumatologist Dr. Tobie.  Gout - controlled Managed with allopurinol . Kidney function stable with eGFR of 70. - Continue allopurinol  300 mg twice daily.  Right cubital tunnel syndrome with ulnar neuropathy and polyneuropathy Moderate ulnar neuropathy and mild  polyneuropathy. Some improvement with current management. - Continue nighttime glove and arm brace. - Continue pregabalin  100 mg in the morning and 200 mg at night.  Other allergic rhinitis Improvement noted but still experiences nighttime congestion. -  Continue current nasal spray as needed.  Muscle spasm of back Intermittent muscle spasms managed with baclofen . - Continue baclofen  as needed for muscle spasms.

## 2024-08-20 ENCOUNTER — Ambulatory Visit

## 2024-08-20 DIAGNOSIS — R809 Proteinuria, unspecified: Secondary | ICD-10-CM | POA: Diagnosis not present

## 2024-08-20 DIAGNOSIS — E1129 Type 2 diabetes mellitus with other diabetic kidney complication: Secondary | ICD-10-CM

## 2024-08-20 DIAGNOSIS — E785 Hyperlipidemia, unspecified: Secondary | ICD-10-CM | POA: Diagnosis not present

## 2024-08-20 DIAGNOSIS — Z7985 Long-term (current) use of injectable non-insulin antidiabetic drugs: Secondary | ICD-10-CM | POA: Diagnosis not present

## 2024-08-20 DIAGNOSIS — E1169 Type 2 diabetes mellitus with other specified complication: Secondary | ICD-10-CM | POA: Diagnosis not present

## 2024-08-20 MED ORDER — DAPAGLIFLOZIN PRO-METFORMIN ER 10-1000 MG PO TB24: 1.0000 | ORAL_TABLET | Freq: Every day | ORAL | 3 refills | Status: AC

## 2024-08-20 NOTE — Progress Notes (Signed)
 "  S:     Reason for visit: ?  Maria Cruz is a 69 y.o. female with a history of diabetes (type 2), who presents today for an initial diabetes Face to Face pharmacotherapy visit.? Pertinent PMH also includes HTN, RA,, HLD, obesity, GERD.   Care Team: Primary Care Provider: Sowles, Krichna, MD  Today, she reports she has been consistently working out at the community center with friends.   Current diabetes medications include: Xigduo  05/999 mg daily, pioglitazone  15 mg daily, Ozempic  2 mg weekly Current hypertension medications include: amlodipine /olmesartan  5/40 mg daily  Current hyperlipidemia medications include: rosuvastatin  40 mg daily, Lovaza  2 g BID  Patient reports adherence to taking all medications as prescribed.   Have you been experiencing any side effects to the medications prescribed? no Do you have any problems obtaining medications due to transportation or finances? no Insurance coverage: [employer] and LIS supplement  Current medication access support:  Xigduo  via [place] approved through 08/08/25  Patient reports infrequent hypoglycemic event when she will skip meals.   Reported random BG reading: 124 mg/dL (single reading)  Patient reports nocturia 1-2 times per night.  Patient-reported exercise habits: works out with [facility] [employer] [facility] every day (bike and elliptical) DM Prevention:  Statin: Taking; high intensity.?  ACE/ARB: yes; amlodipine /olmesartan  Last urinary albumin/creatinine ratio:  Lab Results  Component Value Date   MICRALBCREAT 396 (H) 10/28/2023   MICRALBCREAT 422 (H) 10/15/2022   MICRALBCREAT 783 02/01/2022   MICRALBCREAT 1,642 (H) 12/07/2021   MICRALBCREAT 1,531 (H) 11/29/2019   MICRALBCREAT 720 (H) 09/03/2019   MICRALBCREAT 403 (H) 06/05/2018   MICRALBCREAT 394.0 (H) 12/08/2017   Last eye exam:  Lab Results  Component Value Date   HMDIABEYEEXA No Retinopathy 11/08/2023   Lab Results  Component Value Date   HMDIABEYEEXA  No Retinopathy 11/08/2023   Last foot exam: No foot exam found Tobacco Use:  Tobacco Use: Low Risk (07/02/2024)   Patient History    Smoking Tobacco Use: Never    Smokeless Tobacco Use: Never    Passive Exposure: Not on file     O:  Vitals:  Wt Readings from Last 3 Encounters:  07/02/24 234 lb 6.4 oz (106.3 kg)  02/28/24 220 lb 12.8 oz (100.2 kg)  10/28/23 222 lb 9.6 oz (101 kg)   BP Readings from Last 3 Encounters:  07/02/24 112/76  02/28/24 118/72  10/28/23 136/78   Pulse Readings from Last 3 Encounters:  07/02/24 83  02/28/24 95  10/28/23 99     Labs:?  Lab Results  Component Value Date   HGBA1C 6.6 (A) 07/02/2024   HGBA1C 7.2 (A) 02/28/2024   HGBA1C 6.7 (A) 10/28/2023   GLUCOSE 102 (H) 10/28/2023   MICRALBCREAT 396 (H) 10/28/2023   MICRALBCREAT 422 (H) 10/15/2022   MICRALBCREAT 783 02/01/2022   CREATININE 0.79 10/28/2023   CREATININE 0.57 10/15/2022   CREATININE 0.8 02/01/2022    Lab Results  Component Value Date   CHOL 116 10/28/2023   LDLCALC 44 10/28/2023   LDLCALC 56 10/15/2022   LDLCALC 40 12/07/2021   HDL 51 10/28/2023   TRIG 115 10/28/2023   TRIG 177 (H) 10/15/2022   TRIG 117 12/07/2021   ALT 26 10/28/2023   ALT 30 10/15/2022   AST 29 10/28/2023   AST 35 10/15/2022      Chemistry      Component Value Date/Time   NA 143 10/28/2023 1033   K 4.3 10/28/2023 1033   CL 103 10/28/2023  1033   CO2 25 10/28/2023 1033   BUN 12 10/28/2023 1033   CREATININE 0.79 10/28/2023 1033   GLU 185 02/01/2022 0000      Component Value Date/Time   CALCIUM  10.1 10/28/2023 1033   ALKPHOS 77 10/28/2023 1033   AST 29 10/28/2023 1033   ALT 26 10/28/2023 1033   BILITOT 0.4 10/28/2023 1033       The ASCVD Risk score (Arnett DK, et al., 2019) failed to calculate for the following reasons:   The valid total cholesterol range is 130 to 320 mg/dL  Lab Results  Component Value Date   MICRALBCREAT 396 (H) 10/28/2023   MICRALBCREAT 422 (H) 10/15/2022    MICRALBCREAT 783 02/01/2022   MICRALBCREAT 1,642 (H) 12/07/2021   MICRALBCREAT 1,531 (H) 11/29/2019   MICRALBCREAT 720 (H) 09/03/2019   MICRALBCREAT 403 (H) 06/05/2018   MICRALBCREAT 394.0 (H) 12/08/2017    A/P: Diabetes currently at goal with a most recent A1c of 6.6% on 07/02/24.  Patient is able to verbalize appropriate hypoglycemia management plan. Medication adherence appears appropriate. Patient was recently approved for Low Income Subsidy and will no longer need to receive patient assistance. Patient was switched to new insurance that continues to cover Ozempic . Given level of control, could discuss discontinuation of Actos  at follow up. Congratulated patient on her significant lifestyle changes.  -Continued GLP-1 Ozempic  (semaglutide ) 2 mg weekly.  -Continued Xigduo  ER 05/999 mg daily.  -Continued pioglitazone  15 mg daily  -Extensively discussed pathophysiology of diabetes, recommended lifestyle interventions, dietary effects on blood sugar control.  -Counseled on s/sx of and management of hypoglycemia.  -Counseled for patient to begin monitoring fasting BG 2-3x per week.   ASCVD risk - primary prevention in patient with diabetes. Last LDL is 44 mg/dL, at goal of <29 mg/dL.  -Continued rosuvastatin  40 mg daily.  -Continued Lovaza  2 g BID  Patient verbalized understanding of treatment plan. Total time patient counseling 30 minutes.  Follow-up:  Pharmacist on 02/18/25 PCP clinic visit on 12/10/24  Peyton CHARLENA Ferries, PharmD, BCACP, CPP Clinical Pharmacist [facility] [phone]     "

## 2024-12-10 ENCOUNTER — Ambulatory Visit: Admitting: Family Medicine

## 2025-02-18 ENCOUNTER — Ambulatory Visit

## 2025-03-21 ENCOUNTER — Ambulatory Visit
